# Patient Record
Sex: Female | Born: 1955
Health system: Southern US, Community
[De-identification: ages and names within clinical notes are randomized; demographics above are authoritative.]

## PROBLEM LIST (undated history)

## (undated) DIAGNOSIS — F419 Anxiety disorder, unspecified: Secondary | ICD-10-CM

## (undated) DIAGNOSIS — E039 Hypothyroidism, unspecified: Secondary | ICD-10-CM

## (undated) DIAGNOSIS — E785 Hyperlipidemia, unspecified: Secondary | ICD-10-CM

## (undated) DIAGNOSIS — C801 Malignant (primary) neoplasm, unspecified: Secondary | ICD-10-CM

## (undated) DIAGNOSIS — W19XXXA Unspecified fall, initial encounter: Secondary | ICD-10-CM

## (undated) DIAGNOSIS — E049 Nontoxic goiter, unspecified: Secondary | ICD-10-CM

## (undated) DIAGNOSIS — M810 Age-related osteoporosis without current pathological fracture: Secondary | ICD-10-CM

## (undated) DIAGNOSIS — Z1509 Genetic susceptibility to other malignant neoplasm: Secondary | ICD-10-CM

## (undated) DIAGNOSIS — Z923 Personal history of irradiation: Secondary | ICD-10-CM

## (undated) DIAGNOSIS — Z1501 Genetic susceptibility to malignant neoplasm of breast: Secondary | ICD-10-CM

## (undated) HISTORY — DX: Genetic susceptibility to malignant neoplasm of breast: Z15.01

## (undated) HISTORY — PX: BREAST LUMPECTOMY: SHX2

## (undated) HISTORY — DX: Hyperlipidemia, unspecified: E78.5

## (undated) HISTORY — DX: Genetic susceptibility to other malignant neoplasm: Z15.09

## (undated) HISTORY — DX: Age-related osteoporosis without current pathological fracture: M81.0

## (undated) HISTORY — DX: Anxiety disorder, unspecified: F41.9

## (undated) HISTORY — DX: Nontoxic goiter, unspecified: E04.9

---

## 1997-09-09 ENCOUNTER — Other Ambulatory Visit: Admission: RE | Admit: 1997-09-09 | Discharge: 1997-09-09 | Payer: Self-pay | Admitting: Obstetrics and Gynecology

## 1998-10-06 ENCOUNTER — Other Ambulatory Visit: Admission: RE | Admit: 1998-10-06 | Discharge: 1998-10-06 | Payer: Self-pay | Admitting: Obstetrics and Gynecology

## 1999-10-03 ENCOUNTER — Other Ambulatory Visit: Admission: RE | Admit: 1999-10-03 | Discharge: 1999-10-03 | Payer: Self-pay | Admitting: Obstetrics and Gynecology

## 2000-10-08 ENCOUNTER — Other Ambulatory Visit: Admission: RE | Admit: 2000-10-08 | Discharge: 2000-10-08 | Payer: Self-pay | Admitting: Obstetrics and Gynecology

## 2001-11-11 ENCOUNTER — Other Ambulatory Visit: Admission: RE | Admit: 2001-11-11 | Discharge: 2001-11-11 | Payer: Self-pay | Admitting: Obstetrics and Gynecology

## 2002-12-01 ENCOUNTER — Other Ambulatory Visit: Admission: RE | Admit: 2002-12-01 | Discharge: 2002-12-01 | Payer: Self-pay | Admitting: Obstetrics and Gynecology

## 2005-03-21 ENCOUNTER — Ambulatory Visit (HOSPITAL_COMMUNITY): Admission: RE | Admit: 2005-03-21 | Discharge: 2005-03-21 | Payer: Self-pay | Admitting: Gastroenterology

## 2006-04-02 DIAGNOSIS — C801 Malignant (primary) neoplasm, unspecified: Secondary | ICD-10-CM

## 2006-04-02 HISTORY — DX: Malignant (primary) neoplasm, unspecified: C80.1

## 2007-03-05 ENCOUNTER — Ambulatory Visit: Payer: Self-pay | Admitting: Oncology

## 2007-03-13 ENCOUNTER — Encounter (INDEPENDENT_AMBULATORY_CARE_PROVIDER_SITE_OTHER): Payer: Self-pay | Admitting: Surgery

## 2007-03-13 ENCOUNTER — Ambulatory Visit (HOSPITAL_BASED_OUTPATIENT_CLINIC_OR_DEPARTMENT_OTHER): Admission: RE | Admit: 2007-03-13 | Discharge: 2007-03-13 | Payer: Self-pay | Admitting: Surgery

## 2007-04-03 HISTORY — PX: ABDOMINAL HYSTERECTOMY: SHX81

## 2007-04-16 LAB — CBC WITH DIFFERENTIAL/PLATELET
Eosinophils Absolute: 0.2 10*3/uL (ref 0.0–0.5)
MONO#: 0.5 10*3/uL (ref 0.1–0.9)
NEUT#: 4.2 10*3/uL (ref 1.5–6.5)
Platelets: 293 10*3/uL (ref 145–400)
RBC: 4.68 10*6/uL (ref 3.70–5.32)
RDW: 12.5 % (ref 11.3–14.5)
WBC: 7.2 10*3/uL (ref 3.9–10.0)
lymph#: 2.4 10*3/uL (ref 0.9–3.3)

## 2007-04-16 LAB — COMPREHENSIVE METABOLIC PANEL
ALT: 48 U/L — ABNORMAL HIGH (ref 0–35)
Albumin: 4.9 g/dL (ref 3.5–5.2)
CO2: 25 mEq/L (ref 19–32)
Chloride: 101 mEq/L (ref 96–112)
Glucose, Bld: 119 mg/dL — ABNORMAL HIGH (ref 70–99)
Potassium: 4.2 mEq/L (ref 3.5–5.3)
Sodium: 139 mEq/L (ref 135–145)
Total Bilirubin: 0.6 mg/dL (ref 0.3–1.2)
Total Protein: 8 g/dL (ref 6.0–8.3)

## 2007-04-16 LAB — LACTATE DEHYDROGENASE: LDH: 157 U/L (ref 94–250)

## 2007-04-16 LAB — CANCER ANTIGEN 27.29: CA 27.29: 21 U/mL (ref 0–39)

## 2007-04-21 ENCOUNTER — Ambulatory Visit: Payer: Self-pay | Admitting: Oncology

## 2007-05-01 ENCOUNTER — Ambulatory Visit (HOSPITAL_COMMUNITY): Admission: RE | Admit: 2007-05-01 | Discharge: 2007-05-01 | Payer: Self-pay | Admitting: Oncology

## 2007-05-12 ENCOUNTER — Ambulatory Visit: Admission: RE | Admit: 2007-05-12 | Discharge: 2007-07-22 | Payer: Self-pay | Admitting: Radiation Oncology

## 2007-07-02 ENCOUNTER — Ambulatory Visit: Payer: Self-pay | Admitting: Oncology

## 2007-07-04 LAB — CBC WITH DIFFERENTIAL/PLATELET
Basophils Absolute: 0 10*3/uL (ref 0.0–0.1)
Eosinophils Absolute: 0.1 10*3/uL (ref 0.0–0.5)
HCT: 38.6 % (ref 34.8–46.6)
HGB: 13.6 g/dL (ref 11.6–15.9)
MONO#: 0.4 10*3/uL (ref 0.1–0.9)
NEUT#: 3.5 10*3/uL (ref 1.5–6.5)
NEUT%: 65.2 % (ref 39.6–76.8)
RDW: 12.5 % (ref 11.3–14.5)
WBC: 5.4 10*3/uL (ref 3.9–10.0)
lymph#: 1.3 10*3/uL (ref 0.9–3.3)

## 2007-07-07 LAB — COMPREHENSIVE METABOLIC PANEL
Albumin: 4.7 g/dL (ref 3.5–5.2)
BUN: 15 mg/dL (ref 6–23)
CO2: 27 mEq/L (ref 19–32)
Calcium: 10.3 mg/dL (ref 8.4–10.5)
Chloride: 102 mEq/L (ref 96–112)
Creatinine, Ser: 1.02 mg/dL (ref 0.40–1.20)
Potassium: 3.9 mEq/L (ref 3.5–5.3)

## 2007-07-07 LAB — CANCER ANTIGEN 27.29: CA 27.29: 23 U/mL (ref 0–39)

## 2007-07-07 LAB — FOLLICLE STIMULATING HORMONE: FSH: 109.1 m[IU]/mL

## 2007-07-23 ENCOUNTER — Ambulatory Visit: Admission: RE | Admit: 2007-07-23 | Discharge: 2007-07-23 | Payer: Self-pay | Admitting: Gynecologic Oncology

## 2007-09-09 ENCOUNTER — Ambulatory Visit: Payer: Self-pay | Admitting: Oncology

## 2007-09-11 LAB — COMPREHENSIVE METABOLIC PANEL
ALT: 27 U/L (ref 0–35)
BUN: 18 mg/dL (ref 6–23)
CO2: 25 mEq/L (ref 19–32)
Creatinine, Ser: 0.85 mg/dL (ref 0.40–1.20)
Glucose, Bld: 103 mg/dL — ABNORMAL HIGH (ref 70–99)
Total Bilirubin: 0.4 mg/dL (ref 0.3–1.2)

## 2007-09-11 LAB — CBC WITH DIFFERENTIAL/PLATELET
BASO%: 1.1 % (ref 0.0–2.0)
Basophils Absolute: 0.1 10*3/uL (ref 0.0–0.1)
HCT: 39.2 % (ref 34.8–46.6)
LYMPH%: 21.4 % (ref 14.0–48.0)
MCH: 29.3 pg (ref 26.0–34.0)
MCHC: 34.4 g/dL (ref 32.0–36.0)
MONO#: 0.5 10*3/uL (ref 0.1–0.9)
NEUT%: 68.7 % (ref 39.6–76.8)
Platelets: 232 10*3/uL (ref 145–400)
WBC: 7.2 10*3/uL (ref 3.9–10.0)

## 2007-09-11 LAB — CANCER ANTIGEN 27.29: CA 27.29: 24 U/mL (ref 0–39)

## 2007-10-07 ENCOUNTER — Ambulatory Visit (HOSPITAL_COMMUNITY): Admission: RE | Admit: 2007-10-07 | Discharge: 2007-10-07 | Payer: Self-pay | Admitting: *Deleted

## 2007-10-07 ENCOUNTER — Encounter (INDEPENDENT_AMBULATORY_CARE_PROVIDER_SITE_OTHER): Payer: Self-pay | Admitting: *Deleted

## 2007-12-23 ENCOUNTER — Encounter: Admission: RE | Admit: 2007-12-23 | Discharge: 2007-12-23 | Payer: Self-pay | Admitting: Oncology

## 2008-01-13 ENCOUNTER — Ambulatory Visit: Payer: Self-pay | Admitting: Oncology

## 2008-01-15 LAB — CBC WITH DIFFERENTIAL/PLATELET
BASO%: 0.6 % (ref 0.0–2.0)
Basophils Absolute: 0 10*3/uL (ref 0.0–0.1)
EOS%: 5.6 % (ref 0.0–7.0)
Eosinophils Absolute: 0.3 10*3/uL (ref 0.0–0.5)
HCT: 38.8 % (ref 34.8–46.6)
HGB: 13.4 g/dL (ref 11.6–15.9)
LYMPH%: 29.9 % (ref 14.0–48.0)
MCH: 30.5 pg (ref 26.0–34.0)
MCHC: 34.5 g/dL (ref 32.0–36.0)
MCV: 88.2 fL (ref 81.0–101.0)
MONO#: 0.3 10*3/uL (ref 0.1–0.9)
MONO%: 7 % (ref 0.0–13.0)
NEUT#: 2.7 10*3/uL (ref 1.5–6.5)
NEUT%: 56.9 % (ref 39.6–76.8)
Platelets: 220 10*3/uL (ref 145–400)
RBC: 4.39 10*6/uL (ref 3.70–5.32)
RDW: 13.1 % (ref 11.3–14.5)
WBC: 4.8 10*3/uL (ref 3.9–10.0)
lymph#: 1.4 10*3/uL (ref 0.9–3.3)

## 2008-01-16 LAB — VITAMIN D 25 HYDROXY (VIT D DEFICIENCY, FRACTURES): Vit D, 25-Hydroxy: 24 ng/mL — ABNORMAL LOW (ref 30–89)

## 2008-01-16 LAB — COMPREHENSIVE METABOLIC PANEL
AST: 21 U/L (ref 0–37)
BUN: 14 mg/dL (ref 6–23)
Potassium: 4 mEq/L (ref 3.5–5.3)
Sodium: 143 mEq/L (ref 135–145)

## 2008-01-16 LAB — CANCER ANTIGEN 27.29: CA 27.29: 22 U/mL (ref 0–39)

## 2008-07-13 ENCOUNTER — Ambulatory Visit: Payer: Self-pay | Admitting: Oncology

## 2008-07-15 LAB — CBC WITH DIFFERENTIAL/PLATELET
EOS%: 4.1 % (ref 0.0–7.0)
HGB: 13.4 g/dL (ref 11.6–15.9)
MCH: 29.4 pg (ref 25.1–34.0)
MCHC: 34.4 g/dL (ref 31.5–36.0)
MCV: 85.3 fL (ref 79.5–101.0)
MONO#: 0.3 10*3/uL (ref 0.1–0.9)
MONO%: 5.9 % (ref 0.0–14.0)
NEUT%: 57.8 % (ref 38.4–76.8)
Platelets: 199 10*3/uL (ref 145–400)
lymph#: 1.7 10*3/uL (ref 0.9–3.3)

## 2008-07-16 LAB — CANCER ANTIGEN 27.29: CA 27.29: 29 U/mL (ref 0–39)

## 2008-07-16 LAB — COMPREHENSIVE METABOLIC PANEL
ALT: 17 U/L (ref 0–35)
Albumin: 4.6 g/dL (ref 3.5–5.2)
Calcium: 10.2 mg/dL (ref 8.4–10.5)
Chloride: 102 mEq/L (ref 96–112)
Creatinine, Ser: 1.07 mg/dL (ref 0.40–1.20)
Potassium: 4.1 mEq/L (ref 3.5–5.3)
Sodium: 139 mEq/L (ref 135–145)

## 2009-01-07 ENCOUNTER — Ambulatory Visit: Payer: Self-pay | Admitting: Oncology

## 2009-01-10 LAB — CBC WITH DIFFERENTIAL/PLATELET
BASO%: 0.3 % (ref 0.0–2.0)
EOS%: 6.3 % (ref 0.0–7.0)
Eosinophils Absolute: 0.3 10*3/uL (ref 0.0–0.5)
HCT: 38.5 % (ref 34.8–46.6)
HGB: 13.1 g/dL (ref 11.6–15.9)
LYMPH%: 29.2 % (ref 14.0–49.7)
MCHC: 34.1 g/dL (ref 31.5–36.0)
MONO#: 0.4 10*3/uL (ref 0.1–0.9)
MONO%: 7.7 % (ref 0.0–14.0)
NEUT#: 3.1 10*3/uL (ref 1.5–6.5)
NEUT%: 56.5 % (ref 38.4–76.8)
RDW: 12.7 % (ref 11.2–14.5)

## 2009-01-10 LAB — COMPREHENSIVE METABOLIC PANEL
ALT: 33 U/L (ref 0–35)
BUN: 13 mg/dL (ref 6–23)
Chloride: 104 mEq/L (ref 96–112)
Glucose, Bld: 102 mg/dL — ABNORMAL HIGH (ref 70–99)
Sodium: 139 mEq/L (ref 135–145)
Total Protein: 7.5 g/dL (ref 6.0–8.3)

## 2009-01-11 LAB — CANCER ANTIGEN 27.29: CA 27.29: 26 U/mL (ref 0–39)

## 2009-01-11 LAB — VITAMIN D 25 HYDROXY (VIT D DEFICIENCY, FRACTURES): Vit D, 25-Hydroxy: 35 ng/mL (ref 30–89)

## 2009-03-03 ENCOUNTER — Ambulatory Visit (HOSPITAL_COMMUNITY): Admission: RE | Admit: 2009-03-03 | Discharge: 2009-03-03 | Payer: Self-pay | Admitting: Oncology

## 2009-12-22 ENCOUNTER — Encounter: Admission: RE | Admit: 2009-12-22 | Discharge: 2009-12-22 | Payer: Self-pay | Admitting: Oncology

## 2010-01-11 ENCOUNTER — Ambulatory Visit: Payer: Self-pay | Admitting: Oncology

## 2010-01-13 LAB — CBC WITH DIFFERENTIAL/PLATELET
Basophils Absolute: 0 10*3/uL (ref 0.0–0.1)
HCT: 36.6 % (ref 34.8–46.6)
HGB: 12.8 g/dL (ref 11.6–15.9)
MCH: 30.1 pg (ref 25.1–34.0)
MCHC: 35 g/dL (ref 31.5–36.0)
MONO#: 0.4 10*3/uL (ref 0.1–0.9)
NEUT#: 3.1 10*3/uL (ref 1.5–6.5)
NEUT%: 58 % (ref 38.4–76.8)
RBC: 4.24 10*6/uL (ref 3.70–5.45)
RDW: 12.6 % (ref 11.2–14.5)

## 2010-01-14 LAB — COMPREHENSIVE METABOLIC PANEL
ALT: 20 U/L (ref 0–35)
AST: 20 U/L (ref 0–37)
Albumin: 4.3 g/dL (ref 3.5–5.2)
Alkaline Phosphatase: 71 U/L (ref 39–117)
BUN: 14 mg/dL (ref 6–23)
CO2: 22 mEq/L (ref 19–32)
Calcium: 9.9 mg/dL (ref 8.4–10.5)
Chloride: 104 mEq/L (ref 96–112)
Creatinine, Ser: 0.9 mg/dL (ref 0.40–1.20)
Glucose, Bld: 87 mg/dL (ref 70–99)
Potassium: 4.2 mEq/L (ref 3.5–5.3)
Sodium: 141 mEq/L (ref 135–145)
Total Bilirubin: 0.3 mg/dL (ref 0.3–1.2)
Total Protein: 6.7 g/dL (ref 6.0–8.3)

## 2010-01-14 LAB — VITAMIN D 25 HYDROXY (VIT D DEFICIENCY, FRACTURES): Vit D, 25-Hydroxy: 73 ng/mL (ref 30–89)

## 2010-04-26 ENCOUNTER — Other Ambulatory Visit: Payer: Self-pay | Admitting: Obstetrics and Gynecology

## 2010-08-15 NOTE — Consult Note (Signed)
[name], [name]               ACCOUNT NO.:  [id number]   MEDICAL RECORD NO.:  [id number]          PATIENT TYPE:  OUT   LOCATION:  GYN                          FACILITY:  [facility]   PHYSICIAN:  Paola A. Duard Brady, MD    DATE OF BIRTH:  [birth year]   DATE OF CONSULTATION:  07/23/2007  DATE OF DISCHARGE:                                 CONSULTATION   REFERRING PHYSICIAN:  Floyde Parkins, MD   REASON FOR CONSULTATION:  The patient is seen today in consultation at  the request of Dr. Rosalio Macadamia.  [name] is a very pleasant 55 year old  gravida 2, para 2, who has been menopausal since the age of 33.  She was  diagnosed with breast cancer at the age of 69 and was underwent genetic  testing and is positive for BRCA gene mutation.  She had been on an  NSABP breast cancer trial about 15 years ago and was on tamoxifen for 5  years for breast cancer prevention.  During that time, she underwent  several endometrial biopsies by Dr. Rosalio Macadamia which were always  negative.  A few years ago, she asked Dr. Rosalio Macadamia to perform another  repeat endometrial biopsy and it was also negative.  She comes in today  for discussion of prophylactic bilateral salpingo-oophorectomy.  She is  currently scheduled for surgery with Dr. Rosalio Macadamia in July 2009.  The  issue is that her mother also had a concomitant clear cell carcinoma of  the endometrium diagnosed at the age of 56 and the question arises  whether or not she should undergo a hysterectomy at the time of her  bilateral salpingo-oophorectomy.  The patient at this point states that  nobody has discussed mastectomy with her for her breast cancer to  decrease risk; however, she does not feel that she would undergo that  procedure at this time.  She otherwise denies any significant  complaints.   PAST OBSTETRICAL/GYNECOLOGICAL HISTORY:  She had NSVD x2.   PAST MEDICAL HISTORY:  1. Hypothyroidism.  2. Breast cancer.  3. Anxiety.   ALLERGIES:  AMPICILLIN  and MACRODANTIN, which both cause hives.   MEDICATIONS:  1. Arimidex.  2. Synthroid 112 mcg daily.  3. Tranxene 7.5 mg b.i.d.  4. Ambien CR 12.5 mg nightly.  5. Folic acid 1 mg daily.  6. Multivitamin.  7. Calcium with vitamin D.  8. Fosamax 70 mg weekly.   SOCIAL HISTORY:  She denies history of tobacco or alcohol.  She stays at  home.  She has 2 sons, ages 45 and 84.  Her husband is a physician in  the area.   FAMILY HISTORY:  Her mother had breast cancer at the age of 67 and then  developed a clear cell carcinoma of the endometrium at the age of 14 and  succumbed to that disease.  Her maternal grandmother had breast cancer  in her 49s and died of ovarian cancer in her 63s.  She has a maternal  first cousin with pancreatic cancer.  Her father had colon cancer at the  age of 31 and she has a paternal  uncle who had colon cancer in his 50s.   PHYSICAL EXAMINATION:  VITAL SIGNS:  Weight 137 pounds, height 5 feet 4  inches.  Blood pressure 122/84, pulse 84.  GENERAL:  A well-nourished, well-developed female in no acute distress.  ABDOMEN:  Soft and nontender.   ASSESSMENT AND RECOMMENDATION:  Thirty minutes of face-to-face time were  spent with the patient discussing these issues.  From a BRCA mutation  perspective, that she is BRCA-positive, I would recommend that she  undergo bilateral salpingo-oophorectomy.  We reviewed the data regarding  type II endometrial cancer in breast cancers.  It does appear that woman  who have a prior history of breast cancer have a 2.5 relative increased  risk of developing a clear cell or papillary serous carcinoma as  compared to other endometrial carcinomas.  With that being said, the  risk of endometrial cancer in breast cancer patients is quite small.  I  do not think her prior history of tamoxifen plays significantly into her  subsequently developing an endometrial cancer at this point, as her  tamoxifen history was over 5 years ago.  She asked  me what I would do  and I am always hesitant to state this, but considering her family  history of endometrial cancer as well as colon cancer, could there be  the possibility of another mutation is always a possibility; however,  from our perspective her BRCA mutation, she does need to undergo a  bilateral salpingo-oophorectomy.  She does have an increased risk of  subsequently developing a breast cancer, not only because of her prior  history of breast cancer, but also her BRCA mutation, but she states she  is not prepared to really consider the issue of a mastectomy at this  point.  She will follow up with Dr. Rosalio Macadamia.  She knows that I will be  happy to see her in the future to discuss hysterectomy, but at this  point she appears to be leaning towards bilateral salpingo-oophorectomy  alone.  She has my card and will call me if she has a questions.      Paola A. Duard Brady, MD  Electronically Signed     PAG/MEDQ  D:  07/23/2007  T:  07/23/2007  Job:  [id number]   cc:   Sherry A. Rosalio Macadamia, M.D.  Fax: [phone]   Valentino Hue. Magrinat, M.D.  Fax: [phone]   Dr. Sherald Barge, R.N.  [address]  [place], [place] [address]

## 2010-08-15 NOTE — Op Note (Signed)
[name], [name]               ACCOUNT NO.:  [id number]   MEDICAL RECORD NO.:  [id number]          PATIENT TYPE:  OIB   LOCATION:  9317                          FACILITY:  [facility]   PHYSICIAN:  [place] B. Earlene Mullins, M.D.  DATE OF BIRTH:  [birth year]   DATE OF PROCEDURE:  10/07/2007  DATE OF DISCHARGE:  10/07/2007                               OPERATIVE REPORT   PREOPERATIVE DIAGNOSES:  1. Breast cancer  2. BRCAII Mutation carrier.   POSTOPERATIVE DIAGNOSES:  1. Breast cancer.  2. BRCAII Mutation carrier.   PROCEDURES:  1. Total laparoscopic hysterectomy.  2. Bilateral salpingo-oophorectomy.   SURGEON:  Chester Holstein. Earlene Plater, MD   ASSISTANT:  Floyde Parkins, MD.   ANESTHESIA:  General.   FINDINGS:  Normal-appearing uterus, tubes, ovaries, appendix, and upper  abdomen.   SPECIMENS:  Uterus, cervix, bilateral tubes, and ovaries to pathology.   BLOOD LOSS:  Less than 25 mL.   COMPLICATIONS:  None.   INDICATIONS:  The patient with above history for definitive surgical  management for reduction of subsequent malignancy risks.  The patient  was advised the risks of surgery including infection, bleeding, and  damage to surrounding organs.   PROCEDURE:  The patient was taken to the operating room.  General  anesthesia was obtained.  Prepped and draped in standard fashion.  Foley  catheter was inserted into the bladder.  Uterus sounded to 6 cm.  A #6  RUMI tip was assembled, inserted, and secured.   A 10-mm incision was placed in the umbilicus.  Fascia was divided  sharply and elevated.  Posterior sheath and peritoneum were elevated and  entered sharply.  Purse-string suture of 0 Vicryl was placed on the  fascial defect.  Hasson cannula was inserted and secured.  Pneumoperitoneum was obtained with CO2 gas.  A 5-mm ancillary trocars  was placed in the left and right periumbilical and left lower quadrant,  all under direct laparoscopic visualization.   Trendelenburg position was  obtained.  Bowel was mobilized superiorly and  pelvis was inspected.  Course of each ureter was identified and found to  be well away.   Left round ligament was placed on traction, sealed, and divided with  harmonic Ace.  Left IP ligament was sealed and divided with Gyrus  bipolar.  Dissection was carried along with the harmonic to the broad  ligament.  It was skeletonized anteriorly and posteriorly, exposing the  uterine artery.  Bladder flap was created sharply and advanced sharply.  RUMI was elevated and left uterine artery was sealed and divided with  the harmonic at the level of the KOH ring.  Entire procedure was  repeated on the right side in the exact same fashion.   Anterior colpotomy was made with the harmonic and carried around the  angles posteriorly in the same manner.  Angles were taken down with a  combination of bipolar and harmonic.   Uterus was delivered into the vagina along with tubes and ovaries.  Pneumo Occluder was replaced.   Vaginal cuff was closed with PDO quill suture of the left angle and a  running stitch from the right to close the remainder of the cuff.   Pneumoperitoneum was taken to minimal settings.  Cuff was inspected and  pedicles at the IP ligaments all were hemostatic.   The ancillary ports were removed.  Their sites were hemostatic.   Scope was removed.  Gas was released.  Hasson cannula was removed.  Umbilical incision was elevated with [employer] retractors.  Purse-string  suture was snugged down.  This obliterated the fascial defect.  No intra-  abdominal contents were herniated through prior closure.  Skin was  closed with 4-0 Vicryl and Dermabond.   The patient tolerated the procedure well without complications.  She was  taken to the recovery room awake, alert, and in stable condition.  All  counts were correct per the operating staff.      [name], M.D.  Electronically Signed     WBD/MEDQ  D:  10/07/2007  T:   10/08/2007  Job:  [id number]

## 2010-08-15 NOTE — Op Note (Signed)
[name], ASHMORE               ACCOUNT NO.:  [id number]   MEDICAL RECORD NO.:  [id number]          PATIENT TYPE:  AMB   LOCATION:  DSC                          FACILITY:  [facility]   PHYSICIAN:  Molli Hazard B. Daphine Deutscher, MD  DATE OF BIRTH:  [birth year]   DATE OF PROCEDURE:  03/13/2007  DATE OF DISCHARGE:                               OPERATIVE REPORT   PREOPERATIVE DIAGNOSIS:  Left breast invasive carcinoma.   POSTOPERATIVE DIAGNOSIS:  Left breast invasive carcinoma.   PROCEDURE:  Left axillary sentinel lymph node mapping and lymph node  biopsy, left breast lumpectomy.   SURGEON:  Thornton Park. Daphine Deutscher, MD   ANESTHESIA:  General by LMA.   FINDINGS:  The lymph node touch prep negative, clinical staging T1, N0,  MX.   DESCRIPTION OF PROCEDURE:  Harolyn Cocker was taken to room 6 on 12/11  given general anesthesia.  She got Ancef preop.  Breast was prepped  first marked with the axilla was mapped by me and then marked and then  was prepped with Betadine and draped sterilely.  I made a first  transverse incision in the axilla, carried this down and blunt and right-  angle dissection into the axillary area found a hot zone with blue dye  which I previous injected about 2 mL of blue dye beneath the left  nipple.  I went ahead and were retrieved some fatty tissue that was hot  but minimally so and then found a node that was blue and had counts over  400.  I removed that using little clips to control the lymphovascular  inflow into that area.  After removing that, the background counts went  essentially 0.  That was sent for touch preps which Dr. Star Age said  were negative.   The meantime I went up and made a radial incision at the 3 o'clock  position and created flaps superiorly and inferiorly using the Bovie and  went medially beneath the nipple then essentially excised the lobule at  that site down near the chest wall.  I felt like I got out the area  where there was some skin dimpling  and where the area in question was  with a good margin.  I did encounter some fibrocystic change and cyst in  s area but everything was firm I got well around.  I oriented with the  suture anteriorly toward the nipple with a little clip on it.  It was a  long clip and also posteriorly another.  I also called and oriented this  in the 3 o'clock position.  The wound in the meantime was irrigated.  I  waited until a specimen mammogram was done over at Dr. Cherlyn Labella which  showed the clip was well in  the specimen.  I infiltrated the area with Marcaine, closed it with 4-0  Vicryl, approximated some of the breast tissue within and then  subcutaneously and then used Benzoin Steri-Strips on the skin.  The  patient seemed to tolerate procedure well and was given Percocet to take  for pain and will be followed up in the  office in a few days.      Thornton Park Daphine Deutscher, MD  Electronically Signed     MBM/MEDQ  D:  03/13/2007  T:  03/14/2007  Job:  [id number]   cc:   Dr. Jeralyn Ruths  Eliberto Ivory. Rosalio Macadamia, M.D.

## 2010-09-04 ENCOUNTER — Encounter (INDEPENDENT_AMBULATORY_CARE_PROVIDER_SITE_OTHER): Payer: Self-pay | Admitting: Surgery

## 2010-12-28 LAB — URINE MICROSCOPIC-ADD ON

## 2010-12-28 LAB — CBC
HCT: 39.5
MCHC: 33.5
MCV: 88.6
Platelets: 243
WBC: 5.8

## 2010-12-28 LAB — URINALYSIS, ROUTINE W REFLEX MICROSCOPIC
Glucose, UA: NEGATIVE
Protein, ur: NEGATIVE
Specific Gravity, Urine: 1.01

## 2010-12-28 LAB — DIFFERENTIAL
Basophils Relative: 0
Eosinophils Absolute: 0.2
Eosinophils Relative: 3
Lymphs Abs: 1.4
Monocytes Relative: 7

## 2011-01-08 LAB — POCT HEMOGLOBIN-HEMACUE: Operator id: 128471

## 2011-01-22 ENCOUNTER — Other Ambulatory Visit: Payer: Self-pay | Admitting: Oncology

## 2011-01-22 ENCOUNTER — Encounter (HOSPITAL_BASED_OUTPATIENT_CLINIC_OR_DEPARTMENT_OTHER): Payer: BC Managed Care – PPO | Admitting: Oncology

## 2011-01-22 DIAGNOSIS — C50419 Malignant neoplasm of upper-outer quadrant of unspecified female breast: Secondary | ICD-10-CM

## 2011-01-22 DIAGNOSIS — Z170 Estrogen receptor positive status [ER+]: Secondary | ICD-10-CM

## 2011-01-22 LAB — CBC WITH DIFFERENTIAL/PLATELET
BASO%: 0.5 % (ref 0.0–2.0)
Basophils Absolute: 0 10e3/uL (ref 0.0–0.1)
EOS%: 3.8 % (ref 0.0–7.0)
Eosinophils Absolute: 0.2 10e3/uL (ref 0.0–0.5)
HCT: 38.2 % (ref 34.8–46.6)
HGB: 13.1 g/dL (ref 11.6–15.9)
LYMPH%: 27.4 % (ref 14.0–49.7)
MCH: 30.1 pg (ref 25.1–34.0)
MCHC: 34.2 g/dL (ref 31.5–36.0)
MCV: 88.1 fL (ref 79.5–101.0)
MONO#: 0.4 10e3/uL (ref 0.1–0.9)
MONO%: 6.3 % (ref 0.0–14.0)
NEUT#: 3.7 10e3/uL (ref 1.5–6.5)
NEUT%: 62 % (ref 38.4–76.8)
Platelets: 229 10e3/uL (ref 145–400)
RBC: 4.34 10e6/uL (ref 3.70–5.45)
RDW: 13 % (ref 11.2–14.5)
WBC: 6 10e3/uL (ref 3.9–10.3)
lymph#: 1.7 10e3/uL (ref 0.9–3.3)

## 2011-01-23 LAB — COMPREHENSIVE METABOLIC PANEL
AST: 24 U/L (ref 0–37)
Albumin: 4.8 g/dL (ref 3.5–5.2)
Alkaline Phosphatase: 60 U/L (ref 39–117)
BUN: 15 mg/dL (ref 6–23)
Potassium: 4.2 mEq/L (ref 3.5–5.3)
Sodium: 140 mEq/L (ref 135–145)
Total Protein: 7.5 g/dL (ref 6.0–8.3)

## 2011-01-25 ENCOUNTER — Encounter (HOSPITAL_BASED_OUTPATIENT_CLINIC_OR_DEPARTMENT_OTHER): Payer: BC Managed Care – PPO | Admitting: Oncology

## 2011-01-25 DIAGNOSIS — Z79811 Long term (current) use of aromatase inhibitors: Secondary | ICD-10-CM

## 2011-01-25 DIAGNOSIS — C50919 Malignant neoplasm of unspecified site of unspecified female breast: Secondary | ICD-10-CM

## 2011-01-25 DIAGNOSIS — Z170 Estrogen receptor positive status [ER+]: Secondary | ICD-10-CM

## 2011-12-01 ENCOUNTER — Telehealth: Payer: Self-pay | Admitting: Oncology

## 2011-12-01 NOTE — Telephone Encounter (Signed)
Add to previous note. Lm for nurse per pt asking if this yr is the yr she should have an mri.

## 2011-12-01 NOTE — Telephone Encounter (Signed)
S/w pt re appt for 10/2 and 10/9.

## 2011-12-10 ENCOUNTER — Other Ambulatory Visit: Payer: Self-pay | Admitting: *Deleted

## 2011-12-10 DIAGNOSIS — Z853 Personal history of malignant neoplasm of breast: Secondary | ICD-10-CM | POA: Insufficient documentation

## 2011-12-12 ENCOUNTER — Telehealth: Payer: Self-pay | Admitting: Oncology

## 2011-12-12 NOTE — Telephone Encounter (Signed)
lmonvm of [name] of the breast center regarding this pt needing a breast mri sometime in late sept in correlation with her mammo appt

## 2011-12-28 ENCOUNTER — Other Ambulatory Visit: Payer: Self-pay | Admitting: *Deleted

## 2011-12-31 ENCOUNTER — Other Ambulatory Visit: Payer: BC Managed Care – PPO | Admitting: Lab

## 2011-12-31 ENCOUNTER — Ambulatory Visit
Admission: RE | Admit: 2011-12-31 | Discharge: 2011-12-31 | Disposition: A | Discharge status: Home / Self Care | Payer: BC Managed Care – PPO | Source: Ambulatory Visit | Attending: Oncology | Admitting: Oncology

## 2011-12-31 ENCOUNTER — Other Ambulatory Visit: Payer: Self-pay | Admitting: *Deleted

## 2011-12-31 DIAGNOSIS — Z853 Personal history of malignant neoplasm of breast: Secondary | ICD-10-CM

## 2011-12-31 MED ORDER — GADOBENATE DIMEGLUMINE 529 MG/ML IV SOLN
13.0000 mL | Freq: Once | INTRAVENOUS | Status: AC | PRN
  Administered 2011-12-31: 13 mL via INTRAVENOUS

## 2012-01-02 ENCOUNTER — Other Ambulatory Visit: Payer: BC Managed Care – PPO | Admitting: Lab

## 2012-01-09 ENCOUNTER — Encounter: Payer: Self-pay | Admitting: Oncology

## 2012-01-09 ENCOUNTER — Ambulatory Visit (HOSPITAL_BASED_OUTPATIENT_CLINIC_OR_DEPARTMENT_OTHER): Payer: BC Managed Care – PPO | Admitting: Oncology

## 2012-01-09 ENCOUNTER — Other Ambulatory Visit (HOSPITAL_BASED_OUTPATIENT_CLINIC_OR_DEPARTMENT_OTHER): Payer: BC Managed Care – PPO | Admitting: Lab

## 2012-01-09 ENCOUNTER — Telehealth: Payer: Self-pay | Admitting: Oncology

## 2012-01-09 VITALS — BP 137/68 | HR 66 | Temp 98.2°F | Resp 20 | Wt 145.6 lb

## 2012-01-09 DIAGNOSIS — Z170 Estrogen receptor positive status [ER+]: Secondary | ICD-10-CM

## 2012-01-09 DIAGNOSIS — Z853 Personal history of malignant neoplasm of breast: Secondary | ICD-10-CM

## 2012-01-09 DIAGNOSIS — C50919 Malignant neoplasm of unspecified site of unspecified female breast: Secondary | ICD-10-CM

## 2012-01-09 LAB — CBC WITH DIFFERENTIAL/PLATELET
BASO%: 0.6 % (ref 0.0–2.0)
Basophils Absolute: 0 10*3/uL (ref 0.0–0.1)
HCT: 40.4 % (ref 34.8–46.6)
HGB: 13.5 g/dL (ref 11.6–15.9)
LYMPH%: 29.5 % (ref 14.0–49.7)
MCHC: 33.4 g/dL (ref 31.5–36.0)
MONO#: 0.5 10*3/uL (ref 0.1–0.9)
NEUT%: 59.5 % (ref 38.4–76.8)
Platelets: 239 10*3/uL (ref 145–400)
WBC: 6.7 10*3/uL (ref 3.9–10.3)

## 2012-01-09 LAB — COMPREHENSIVE METABOLIC PANEL (CC13)
ALT: 35 U/L (ref 0–55)
BUN: 17 mg/dL (ref 7.0–26.0)
CO2: 25 mEq/L (ref 22–29)
Calcium: 10.3 mg/dL (ref 8.4–10.4)
Creatinine: 0.9 mg/dL (ref 0.6–1.1)
Glucose: 106 mg/dl — ABNORMAL HIGH (ref 70–99)
Total Bilirubin: 0.5 mg/dL (ref 0.20–1.20)

## 2012-01-09 MED ORDER — ANASTROZOLE 1 MG PO TABS: 1.0000 mg | ORAL_TABLET | Freq: Every day | ORAL | Status: DC

## 2012-01-09 NOTE — Telephone Encounter (Signed)
gve the pt her April 2014 appt calendar °

## 2012-01-09 NOTE — Progress Notes (Signed)
ID: [name]   DOB: 05-Nov-1955  MR#: [id number]  WUJ#:[id number]  PCP: Enrique Sack, MD GYN: Hilary Hertz SU: Luretha Murphy MD OTHER MD: Archer Asa, Danise Edge, Hoope Gruve   HISTORY OF PRESENT ILLNESS: [name] had an unremarkable mammogram at [facility] on December 16, 2006.  Her breasts are dense, and of course that decreases the sensitivity.  In any case the screening mammogram was read as unremarkable.  In December, however, [name] had her annual examination under Floyde Parkins, and Dr. Rosalio Macadamia palpated a lump in the left breast at the 3 o'clock position.  The patient was referred back for ultrasound, and this showed a hypoechoic mass measuring 1.4 cm corresponding to the palpable abnormality.  Going back to the September mammogram, Dr. Yolanda Bonine was able to tease out what possibly could have been a mammographic finding, but really it was so equivocal, that even in retrospect, the mammogram was not informative.   This was followed up with breast specific gamma imaging on March 11, 2007, and this found a solitary lesion, measuring up to 1.7 cm.  In addition, there was a 1.0 cm low intensity uptake area in the axilla, considered an indeterminate finding.    With this information, the patient proceeded to biopsy under ultrasound guidance of the mass.  This was performed on December 9, and it showed an invasive ductal carcinoma, which was ER positive at 100%, PR positive at 56%, with a borderline/low proliferation marker at 17%, and Hercept test negative at 1+.  [name] was then referred to Dr. Daphine Deutscher for further evaluation, and direction for definitive surgery, and she underwent left lumpectomy and sentinel lymph node biopsy March 13, 2007 for what proved to be ([id number]) a 2.1 cm invasive ductal carcinoma with some micro-papillary features, grade 2, with evidence of lymphovascular invasion, but zero of two sentinel lymph nodes involved.  Margins were adequate for both the  invasive and in situ components. Her subsequent history is as detailed below.  INTERVAL HISTORY: [name] returns today for followup of her breast cancer the interval history is unremarkable. She enjoys visiting the grandchildren, she and Ed recently went hiking in [place], and she continues to be extremely active at tennis, the gym, and running/walking.   REVIEW OF SYSTEMS: She is tolerating the Arimidex well, except for problems with vaginal dryness. She has occasional hot flashes which are not a major issue. Her worst problem is anxiety, particularly before coming here before having all the tests. Otherwise a detailed review of systems today was entirely noncontributory.    PAST MEDICAL HISTORY: Past Medical History  Diagnosis Date  . Goiter   . Osteoporosis   . Dyslipidemia   . Anxiety disorder     PAST SURGICAL HISTORY: Past Surgical History  Procedure Date  . Breast lumpectomy 2008  . Abdominal hysterectomy 2009    with BSO    FAMILY HISTORY (updated OCT 2013) Family History  Problem Relation Age of Onset  . Cancer Mother   The patient's father is currently 98, with a remote history of colon cancer.  The patient's mother died at the age of 25.  She had a history of breast cancer and the patient's mother's mother died from cancer, but the actual original site of cancer is unclear.  The patient has several stepbrothers and sisters, but only one half-sister, currently 42,with no history of cancer.   GYNECOLOGIC HISTORY: She is GX, P2.  First pregnancy age 56.  Last menstrual period age 20.  She did  not take hormone replacement.    SOCIAL HISTORY: (updated OCT 2013) Of course she is [name]. He has 4 children from an earlier marriage. She has two from hers, a 71 y/o son who works at [employer] in [place], and a 37 y/o son who lives in [place]. They have 3 grandchildren.   ADVANCED DIRECTIVES: in place  HEALTH MAINTENANCE: History  Substance Use Topics  . Smoking  status: Unknown If Ever Smoked  . Smokeless tobacco: Not on file  . Alcohol Use: Yes     GLASS OF WINE MOST DAYS     Colonoscopy: August 2012  PAP: Jan 2012  Bone density: 2013 AT [facility]/ stable  Lipid panel:  Allergies  Allergen Reactions  . Ampicillin     Current Outpatient Prescriptions  Medication Sig Dispense Refill  . anastrozole (ARIMIDEX) 1 MG tablet Take 1 tablet (1 mg total) by mouth daily.  90 tablet  3  . levothyroxine (SYNTHROID, LEVOTHROID) 125 MCG tablet Take 125 mcg by mouth daily.        . temazepam (RESTORIL) 7.5 MG capsule Take 7.5 mg by mouth at bedtime as needed.        . Zolpidem Tartrate (AMBIEN PO) Take by mouth.          OBJECTIVE: Middle-aged white woman who appears well Filed Vitals:   01/09/12 1558  BP: 137/68  Pulse: 66  Temp: 98.2 F (36.8 C)  Resp: 20     There is no height on file to calculate BMI.    ECOG FS: 0  Sclerae unicteric Oropharynx clear No cervical or supraclavicular adenopathy Lungs no rales or rhonchi Heart regular rate and rhythm Abd benign MSK no focal spinal tenderness, no peripheral edema Neuro: nonfocal Breasts: The right breast is unremarkable. The left breast is status post lumpectomy and radiation. There is no evidence of local recurrence. Left axilla is benign.  LAB RESULTS: Lab Results  Component Value Date   WBC 6.7 01/09/2012   NEUTROABS 4.0 01/09/2012   HGB 13.5 01/09/2012   HCT 40.4 01/09/2012   MCV 85.4 01/09/2012   PLT 239 01/09/2012      Chemistry      Component Value Date/Time   NA 139 01/09/2012 1522   NA 140 01/22/2011 1408   NA 140 01/22/2011 1408   NA 140 01/22/2011 1408   NA 140 01/22/2011 1408   K 4.0 01/09/2012 1522   K 4.2 01/22/2011 1408   K 4.2 01/22/2011 1408   K 4.2 01/22/2011 1408   K 4.2 01/22/2011 1408   CL 103 01/09/2012 1522   CL 104 01/22/2011 1408   CL 104 01/22/2011 1408   CL 104 01/22/2011 1408   CL 104 01/22/2011 1408   CO2 25 01/09/2012 1522   CO2 26 01/22/2011 1408    CO2 26 01/22/2011 1408   CO2 26 01/22/2011 1408   CO2 26 01/22/2011 1408   BUN 17.0 01/09/2012 1522   BUN 15 01/22/2011 1408   BUN 15 01/22/2011 1408   BUN 15 01/22/2011 1408   BUN 15 01/22/2011 1408   CREATININE 0.9 01/09/2012 1522   CREATININE 0.84 01/22/2011 1408   CREATININE 0.84 01/22/2011 1408   CREATININE 0.84 01/22/2011 1408   CREATININE 0.84 01/22/2011 1408      Component Value Date/Time   CALCIUM 10.3 01/09/2012 1522   CALCIUM 10.0 01/22/2011 1408   CALCIUM 10.0 01/22/2011 1408   CALCIUM 10.0 01/22/2011 1408   CALCIUM 10.0 01/22/2011 1408  ALKPHOS 85 01/09/2012 1522   ALKPHOS 60 01/22/2011 1408   ALKPHOS 60 01/22/2011 1408   ALKPHOS 60 01/22/2011 1408   ALKPHOS 60 01/22/2011 1408   AST 26 01/09/2012 1522   AST 24 01/22/2011 1408   AST 24 01/22/2011 1408   AST 24 01/22/2011 1408   AST 24 01/22/2011 1408   ALT 35 01/09/2012 1522   ALT 22 01/22/2011 1408   ALT 22 01/22/2011 1408   ALT 22 01/22/2011 1408   ALT 22 01/22/2011 1408   BILITOT 0.50 01/09/2012 1522   BILITOT 0.4 01/22/2011 1408   BILITOT 0.4 01/22/2011 1408   BILITOT 0.4 01/22/2011 1408   BILITOT 0.4 01/22/2011 1408       Lab Results  Component Value Date   LABCA2 17 01/22/2011   LABCA2 17 01/22/2011   LABCA2 17 01/22/2011    No components found with this basename: [id number]    No results found for this basename: INR:1;PROTIME:1 in the last 168 hours  Urinalysis    Component Value Date/Time   COLORURINE YELLOW 10/07/2007 0906   APPEARANCEUR CLEAR 10/07/2007 0906   LABSPEC 1.010 10/07/2007 0906   PHURINE 7.0 10/07/2007 0906   GLUCOSEU NEGATIVE 10/07/2007 0906   HGBUR TRACE* 10/07/2007 0906   BILIRUBINUR NEGATIVE 10/07/2007 0906   KETONESUR NEGATIVE 10/07/2007 0906   PROTEINUR NEGATIVE 10/07/2007 0906   UROBILINOGEN 0.2 10/07/2007 0906   NITRITE NEGATIVE 10/07/2007 0906   LEUKOCYTESUR MODERATE* 10/07/2007 0906    STUDIES: Mr Breast Bilateral W Wo Contrast  01/01/2012  *RADIOLOGY REPORT*  Clinical Data:  History of malignant lumpectomy of the left breast with radiation therapy in 2008.  BILATERAL BREAST MRI WITH AND WITHOUT CONTRAST  Technique: Multiplanar, multisequence MR images of both breasts were obtained prior to and following the intravenous administration of 13ml of Multihance.  Three dimensional images were evaluated at the independent DynaCad workstation.  Comparison:  12/22/2009 and 12/23/2007 breast MRIs. Also, comparison with prior mammograms from [facility] imaging the most recent which is dated 12/26/2011.  The  Findings: There is mild to moderate diffuse parenchymal background enhancement.  There are mild scarring changes located laterally within the left breast related to the patient's previous lumpectomy.  There are no worrisome enhancing foci within either breast.  There is no evidence for axillary or internal mammary adenopathy and there are no additional findings.  IMPRESSION: No findings worrisome for recurrent tumor or developing malignancy.  RECOMMENDATION: Diagnostic mammography in 1 year.  THREE-DIMENSIONAL MR IMAGE RENDERING ON INDEPENDENT WORKSTATION:  Three-dimensional MR images were rendered by post-processing of the original MR data on an independent workstation.  The three- dimensional MR images were interpreted, and findings were reported in the accompanying complete MRI report for this study.  BI-RADS CATEGORY 1:  Negative.   Original Report Authenticated By: Rolla Plate, M.D.     ASSESSMENT: 56 y.o. [place] woman status post left lumpectomy and sentinel lymph node biopsy December 2008 for a T2 N0, grade 2, invasive ductal carcinoma, strongly estrogen and progesterone receptor positive, HER2 negative with an MIB1 of 17%, in the setting of a deleterious BRCA2 mutation; with an Oncotype DX recurrence score of 20 predicting a 10-year risk of recurrence of 13% after 5 years on tamoxifen.  However, since she had taken tamoxifen remotely for breast cancer prevention, she started  Arimidex April 2009 and, of course, that is known to be more effective than tamoxifen.  Therefore, her final risk of recurrence should be less than 13%.  Note that she  underwent a TAH/BSO with benign pathology in July 2009  PLAN: [place] is doing terrific, with no evidence of disease recurrence. The plan is to continue anastrozole until April of 2014, and then reassess. She knows to call for any problems that may develop before the next visit.   MAGRINAT,GUSTAV C    01/09/2012

## 2012-01-10 LAB — CANCER ANTIGEN 27.29: CA 27.29: 20 U/mL (ref 0–39)

## 2012-06-30 ENCOUNTER — Other Ambulatory Visit: Payer: Self-pay | Admitting: Oncology

## 2012-07-02 ENCOUNTER — Telehealth (INDEPENDENT_AMBULATORY_CARE_PROVIDER_SITE_OTHER): Payer: Self-pay

## 2012-07-02 NOTE — Telephone Encounter (Signed)
Dr Chilton Si calling about his wife needing an appt with Dr Daphine Deutscher in the next couple of weeks for a growth near her rectum. The wife called to make an appt but they gave her an appt for the middle of May but Dr Chilton Si said his wife will go crazy waiting to be seen. I notified Dr Chilton Si that I would get Dr Ermalene Searing nurse Meagan to call him with an earlier appt date.

## 2012-07-03 ENCOUNTER — Telehealth (INDEPENDENT_AMBULATORY_CARE_PROVIDER_SITE_OTHER): Payer: Self-pay

## 2012-07-03 NOTE — Telephone Encounter (Signed)
LMOM letting pt know that I have scheduled her to come see MM on Thursday, April 17 @ 5p

## 2012-07-14 ENCOUNTER — Ambulatory Visit (HOSPITAL_BASED_OUTPATIENT_CLINIC_OR_DEPARTMENT_OTHER): Payer: BC Managed Care – PPO | Admitting: Physician Assistant

## 2012-07-14 ENCOUNTER — Encounter: Payer: Self-pay | Admitting: Physician Assistant

## 2012-07-14 ENCOUNTER — Telehealth: Payer: Self-pay | Admitting: *Deleted

## 2012-07-14 ENCOUNTER — Other Ambulatory Visit: Payer: BC Managed Care – PPO | Admitting: Lab

## 2012-07-14 VITALS — BP 120/69 | HR 62 | Temp 97.7°F | Resp 20 | Wt 148.3 lb

## 2012-07-14 DIAGNOSIS — C50919 Malignant neoplasm of unspecified site of unspecified female breast: Secondary | ICD-10-CM

## 2012-07-14 DIAGNOSIS — Z853 Personal history of malignant neoplasm of breast: Secondary | ICD-10-CM

## 2012-07-14 DIAGNOSIS — M810 Age-related osteoporosis without current pathological fracture: Secondary | ICD-10-CM

## 2012-07-14 DIAGNOSIS — Z170 Estrogen receptor positive status [ER+]: Secondary | ICD-10-CM

## 2012-07-14 DIAGNOSIS — Z1509 Genetic susceptibility to other malignant neoplasm: Secondary | ICD-10-CM

## 2012-07-14 DIAGNOSIS — Z1501 Genetic susceptibility to malignant neoplasm of breast: Secondary | ICD-10-CM

## 2012-07-14 HISTORY — DX: Genetic susceptibility to malignant neoplasm of breast: Z15.01

## 2012-07-14 MED ORDER — ANASTROZOLE 1 MG PO TABS: 1.0000 mg | ORAL_TABLET | Freq: Every day | ORAL | Status: DC

## 2012-07-14 NOTE — Telephone Encounter (Signed)
appts made and printed...td 

## 2012-07-14 NOTE — Progress Notes (Signed)
ID: Xzandria Clevinger Novinger   DOB: October 29, 1955  MR#: [id number]  WUJ#:[id number]  PCP: Enrique Sack, MD GYN: Hilary Hertz SU: Luretha Murphy MD OTHER MD: Archer Asa, Danise Edge, Hoope Gruve   HISTORY OF PRESENT ILLNESS: Moira had an unremarkable mammogram at [facility] on December 16, 2006.  Her breasts are dense, and of course that decreases the sensitivity.  In any case the screening mammogram was read as unremarkable.  In December, however, Misaki had her annual examination under [facility], and Dr. Rosalio Macadamia palpated a lump in the left breast at the 3 o'clock position.  The patient was referred back for ultrasound, and this showed a hypoechoic mass measuring 1.4 cm corresponding to the palpable abnormality.  Going back to the September mammogram, Dr. Yolanda Bonine was able to tease out what possibly could have been a mammographic finding, but really it was so equivocal, that even in retrospect, the mammogram was not informative.   This was followed up with breast specific gamma imaging on March 11, 2007, and this found a solitary lesion, measuring up to 1.7 cm.  In addition, there was a 1.0 cm low intensity uptake area in the axilla, considered an indeterminate finding.    With this information, the patient proceeded to biopsy under ultrasound guidance of the mass.  This was performed on December 9, and it showed an invasive ductal carcinoma, which was ER positive at 100%, PR positive at 56%, with a borderline/low proliferation marker at 17%, and Hercept test negative at 1+.  Ovie was then referred to Dr. Daphine Deutscher for further evaluation, and direction for definitive surgery, and she underwent left lumpectomy and sentinel lymph node biopsy March 13, 2007 for what proved to be ([id number]) a 2.1 cm invasive ductal carcinoma with some micro-papillary features, grade 2, with evidence of lymphovascular invasion, but zero of two sentinel lymph nodes involved.  Margins were adequate for both the  invasive and in situ components. Her subsequent history is as detailed below.  INTERVAL HISTORY: [place] returns today for followup of her left breast cancer.  She continues on anastrozole, which she has now been on for total of 5 years. She is tolerating the medication well. She has occasional hot flashes, but nothing that is problematic. She has some mild vaginal dryness which she says is "tolerable". She's had no increased joint pain.  Ryelynn stays very busy, and is very active. She exercises on a regular basis. Her energy level is good.  REVIEW OF SYSTEMS: Diavion has had no recent illnesses and denies any fevers or chills. She's had no rashes or skin changes and denies any abnormal bruising or bleeding. Her appetite is good. She denies any problems with nausea or emesis. She is having some mild "GI issues" with some slight rectal bleeding, all of which are being followed by Dr. Laural Benes. (She is status post colonoscopy approximately one year ago.) She's had no new cough, shortness of breath, chest pain, palpitations. No abnormal headaches or dizziness. She denies any unusual myalgias, arthralgias, or bony pain.   A detailed review of systems is otherwise stable and noncontributory.    PAST MEDICAL HISTORY: Past Medical History  Diagnosis Date  . Goiter   . Osteoporosis   . Dyslipidemia   . Anxiety disorder   . BRCA2 positive 07/14/2012    PAST SURGICAL HISTORY: Past Surgical History  Procedure Laterality Date  . Breast lumpectomy  2008  . Abdominal hysterectomy  2009    with BSO    FAMILY HISTORY (updated  OCT 2013) Family History  Problem Relation Age of Onset  . Cancer Mother   The patient's father is currently 56, with a remote history of colon cancer.  The patient's mother died at the age of 67.  She had a history of breast cancer and the patient's mother's mother died from cancer, but the actual original site of cancer is unclear.  The patient has several stepbrothers and  sisters, but only one half-sister, currently 42,with no history of cancer.   GYNECOLOGIC HISTORY: She is GX, P2.  First pregnancy age 70.  Last menstrual period age 89.  She did not take hormone replacement.    SOCIAL HISTORY: (updated OCT 2013) Of course she is [name]. He has 4 children from an earlier marriage. She has two from hers, a 10 y/o son who works at [employer] in [place], and a 52 y/o son who lives in [place]. They have 3 grandchildren.   ADVANCED DIRECTIVES: in place  HEALTH MAINTENANCE: History  Substance Use Topics  . Smoking status: Former Smoker    Types: Cigarettes  . Smokeless tobacco: Never Used  . Alcohol Use: Yes     Comment: GLASS OF WINE MOST DAYS     Colonoscopy: August 2012  PAP: April 2014  Bone density: 2013 AT [facility]/ stable osteoporosis  Lipid panel:  Allergies  Allergen Reactions  . Ampicillin     Current Outpatient Prescriptions  Medication Sig Dispense Refill  . alendronate (FOSAMAX) 70 MG tablet       . anastrozole (ARIMIDEX) 1 MG tablet Take 1 tablet (1 mg total) by mouth daily.  90 tablet  3  . clorazepate (TRANXENE) 7.5 MG tablet       . halobetasol (ULTRAVATE) 0.05 % ointment       . hydrocortisone (ANUSOL-HC) 25 MG suppository       . levothyroxine (SYNTHROID, LEVOTHROID) 125 MCG tablet Take 125 mcg by mouth daily.        . Probiotic Product (PROBIOTIC DAILY PO) Take 1 each by mouth daily.      . rosuvastatin (CRESTOR) 5 MG tablet Take 5 mg by mouth daily.      . temazepam (RESTORIL) 7.5 MG capsule Take 7.5 mg by mouth at bedtime as needed.        . Zolpidem Tartrate (AMBIEN PO) Take by mouth.         No current facility-administered medications for this visit.    OBJECTIVE: Middle-aged white woman who appears well Filed Vitals:   07/14/12 1506  BP: 120/69  Pulse: 62  Temp: 97.7 F (36.5 C)  Resp: 20     There is no height on file to calculate BMI.    ECOG FS: 0 Filed Weights   07/14/12 1506  Weight: 148 lb 4.8 oz  (67.268 kg)   Sclerae unicteric Oropharynx clear No cervical or supraclavicular adenopathy Lungs clear to auscultation bilaterally, no rales or rhonchi Heart regular rate and rhythm Abdomen is soft, nontender to palpation, with positive bowel sounds MSK no focal spinal tenderness to palpation, no peripheral edema Neuro: nonfocal, well oriented with positive affect Breasts: The right breast is unremarkable. The left breast is status post lumpectomy and radiation. There is no evidence of local recurrence. Axillae are benign bilaterally, no palpable adenopathy noted.  LAB RESULTS: Lab Results  Component Value Date   WBC 6.7 01/09/2012   NEUTROABS 4.0 01/09/2012   HGB 13.5 01/09/2012   HCT 40.4 01/09/2012   MCV 85.4 01/09/2012  PLT 239 01/09/2012      Chemistry      Component Value Date/Time   NA 139 01/09/2012 1522   NA 140 01/22/2011 1408   K 4.0 01/09/2012 1522   K 4.2 01/22/2011 1408   CL 103 01/09/2012 1522   CL 104 01/22/2011 1408   CO2 25 01/09/2012 1522   CO2 26 01/22/2011 1408   BUN 17.0 01/09/2012 1522   BUN 15 01/22/2011 1408   CREATININE 0.9 01/09/2012 1522   CREATININE 0.84 01/22/2011 1408      Component Value Date/Time   CALCIUM 10.3 01/09/2012 1522   CALCIUM 10.0 01/22/2011 1408   ALKPHOS 85 01/09/2012 1522   ALKPHOS 60 01/22/2011 1408   AST 26 01/09/2012 1522   AST 24 01/22/2011 1408   ALT 35 01/09/2012 1522   ALT 22 01/22/2011 1408   BILITOT 0.50 01/09/2012 1522   BILITOT 0.4 01/22/2011 1408       Lab Results  Component Value Date   LABCA2 20 01/09/2012      STUDIES: Mr Breast Bilateral W Wo Contrast  01/01/2012  *RADIOLOGY REPORT*  Clinical Data: History of malignant lumpectomy of the left breast with radiation therapy in 2008.  BILATERAL BREAST MRI WITH AND WITHOUT CONTRAST  Technique: Multiplanar, multisequence MR images of both breasts were obtained prior to and following the intravenous administration of 13ml of Multihance.  Three dimensional images  were evaluated at the independent DynaCad workstation.  Comparison:  12/22/2009 and 12/23/2007 breast MRIs. Also, comparison with prior mammograms from [facility] the most recent which is dated 12/26/2011.  The  Findings: There is mild to moderate diffuse parenchymal background enhancement.  There are mild scarring changes located laterally within the left breast related to the patient's previous lumpectomy.  There are no worrisome enhancing foci within either breast.  There is no evidence for axillary or internal mammary adenopathy and there are no additional findings.  IMPRESSION: No findings worrisome for recurrent tumor or developing malignancy.  RECOMMENDATION: Diagnostic mammography in 1 year.  THREE-DIMENSIONAL MR IMAGE RENDERING ON INDEPENDENT WORKSTATION:  Three-dimensional MR images were rendered by post-processing of the original MR data on an independent workstation.  The three- dimensional MR images were interpreted, and findings were reported in the accompanying complete MRI report for this study.  BI-RADS CATEGORY 1:  Negative.   Original Report Authenticated By: Rolla Plate, M.D.    Most recent bilateral mammogram at [facility] on 12/26/2011 was unremarkable.  Most recent bone density at [facility] on 07/19/2011 showed osteoporosis.    ASSESSMENT: 57 y.o. BRCA2 positive [place] woman   (1)  status post left lumpectomy and sentinel lymph node biopsy December 2008 for a T2 N0, grade 2, invasive ductal carcinoma, strongly estrogen and progesterone receptor positive, HER2 negative with an MIB1 of 17%, in the setting of a deleterious BRCA2 mutation; with an Oncotype DX recurrence score of 20 predicting a 10-year risk of recurrence of 13% after 5 years on tamoxifen.    (2)  had taken tamoxifen remotely for breast cancer prevention,   (3)  She started Arimidex April 2009. Therefore, her final risk of recurrence should be less than 13%.    (4) She underwent a TAH/BSO with benign pathology in  July 2009  PLAN:  This case was reviewed with Dr. Darnelle Catalan who also spoke extensively with the patient today. Elasha is doing extremely well with regards to her breast cancer, now 5 years out since beginning anastrozole.  We reviewed Momina's options: (1)  stopped the antiestrogen pills altogether; (2) stopped the anastrozole and start back on tamoxifen; or (3) continue on anastrozole, perhaps for another 5 years.  Since Dawnn has taken the tamoxifen in the past for prevention, she is hesitant to go back to the tamoxifen. She's also hesitant to stop the antiestrogen therapy altogether. She's tolerating the anastrozole extremely well, and her thoughts are that she would like to continue. We certainly think this is a reasonable decision. Accordingly, she will continue on the anastrozole, perhaps for another 5 years.  She is due for her next mammogram at [facility] in September, and we will add a BGSI as well. (Recall that she alternates yearly between BGSI and breast MRIs in addition to her annual mammograms.) She is due for her next bone density at [facility] in April 2015, and until that time will continue on Fosamax for her osteoporosis. We will reassess following her next bone density, and we'll decide whether or not to give her a "treatment holiday" from the Fosamax.  Kruti  was given all this information in writing today. She voices understanding and agreement with this plan. She knows to call for any problems that may develop before the next visit.   Calandria Mullings    07/14/2012

## 2012-07-16 ENCOUNTER — Other Ambulatory Visit (HOSPITAL_BASED_OUTPATIENT_CLINIC_OR_DEPARTMENT_OTHER): Payer: BC Managed Care – PPO | Admitting: Lab

## 2012-07-16 DIAGNOSIS — Z853 Personal history of malignant neoplasm of breast: Secondary | ICD-10-CM

## 2012-07-16 DIAGNOSIS — C50919 Malignant neoplasm of unspecified site of unspecified female breast: Secondary | ICD-10-CM

## 2012-07-16 LAB — COMPREHENSIVE METABOLIC PANEL (CC13)
ALT: 24 U/L (ref 0–55)
AST: 19 U/L (ref 5–34)
Alkaline Phosphatase: 82 U/L (ref 40–150)
BUN: 15.1 mg/dL (ref 7.0–26.0)
Chloride: 106 mEq/L (ref 98–107)
Creatinine: 0.9 mg/dL (ref 0.6–1.1)
Total Bilirubin: 0.42 mg/dL (ref 0.20–1.20)

## 2012-07-16 LAB — CBC WITH DIFFERENTIAL/PLATELET
BASO%: 0.8 % (ref 0.0–2.0)
Basophils Absolute: 0.1 10*3/uL (ref 0.0–0.1)
EOS%: 3.2 % (ref 0.0–7.0)
HCT: 40.4 % (ref 34.8–46.6)
HGB: 13.5 g/dL (ref 11.6–15.9)
LYMPH%: 28.5 % (ref 14.0–49.7)
MCH: 28.7 pg (ref 25.1–34.0)
MCHC: 33.5 g/dL (ref 31.5–36.0)
MCV: 85.6 fL (ref 79.5–101.0)
NEUT%: 60 % (ref 38.4–76.8)
Platelets: 228 10*3/uL (ref 145–400)

## 2012-07-17 ENCOUNTER — Ambulatory Visit (INDEPENDENT_AMBULATORY_CARE_PROVIDER_SITE_OTHER): Payer: BC Managed Care – PPO | Admitting: Surgery

## 2012-07-17 ENCOUNTER — Encounter (INDEPENDENT_AMBULATORY_CARE_PROVIDER_SITE_OTHER): Payer: Self-pay | Admitting: Surgery

## 2012-07-17 VITALS — BP 106/70 | HR 71 | Temp 97.6°F | Resp 16 | Ht 63.5 in | Wt 147.4 lb

## 2012-07-17 DIAGNOSIS — K629 Disease of anus and rectum, unspecified: Secondary | ICD-10-CM

## 2012-07-17 DIAGNOSIS — K6289 Other specified diseases of anus and rectum: Secondary | ICD-10-CM

## 2012-07-17 NOTE — Progress Notes (Signed)
Left buttock 1 cm from anal verge posteriorly is a pigmented nevus. We discussed removal here in the office or at [facility] day surgery. She preferred to have it removed here. The area was prepped with chlorhexidine. He was infiltrated with lidocaine with epinephrine and Neut. An ellipse was excised including the pigmented nevus and sent to pathology. The area was cauterized with silver nitrate and closed with a single 3-0 chromic. Neosporin on them was applied. We'll followup to discuss the path with her over the phone next week.

## 2012-08-08 ENCOUNTER — Encounter (INDEPENDENT_AMBULATORY_CARE_PROVIDER_SITE_OTHER): Payer: Self-pay | Admitting: Surgery

## 2012-12-25 ENCOUNTER — Other Ambulatory Visit: Payer: Self-pay | Admitting: Radiology

## 2012-12-25 DIAGNOSIS — Z803 Family history of malignant neoplasm of breast: Secondary | ICD-10-CM

## 2012-12-25 DIAGNOSIS — Z853 Personal history of malignant neoplasm of breast: Secondary | ICD-10-CM

## 2013-01-06 ENCOUNTER — Ambulatory Visit
Admission: RE | Admit: 2013-01-06 | Discharge: 2013-01-06 | Disposition: A | Discharge status: Home / Self Care | Payer: BC Managed Care – PPO | Source: Ambulatory Visit | Attending: Radiology | Admitting: Radiology

## 2013-01-06 DIAGNOSIS — Z853 Personal history of malignant neoplasm of breast: Secondary | ICD-10-CM

## 2013-01-06 DIAGNOSIS — Z803 Family history of malignant neoplasm of breast: Secondary | ICD-10-CM

## 2013-01-06 MED ORDER — GADOBENATE DIMEGLUMINE 529 MG/ML IV SOLN
13.0000 mL | Freq: Once | INTRAVENOUS | Status: AC | PRN
  Administered 2013-01-06: 13 mL via INTRAVENOUS

## 2013-01-09 ENCOUNTER — Telehealth: Payer: Self-pay | Admitting: *Deleted

## 2013-01-09 NOTE — Telephone Encounter (Signed)
Pt called to have her labs moved to the am for 10.23.14. gv time for 9:15am on 10.23.14.pt is aware...td

## 2013-01-20 ENCOUNTER — Telehealth: Payer: Self-pay | Admitting: Oncology

## 2013-01-20 NOTE — Telephone Encounter (Signed)
pt wanted labs the same day as visit...done

## 2013-01-22 ENCOUNTER — Other Ambulatory Visit: Payer: BC Managed Care – PPO | Admitting: Lab

## 2013-01-22 ENCOUNTER — Other Ambulatory Visit: Payer: BC Managed Care – PPO

## 2013-01-29 ENCOUNTER — Other Ambulatory Visit (HOSPITAL_BASED_OUTPATIENT_CLINIC_OR_DEPARTMENT_OTHER): Payer: BC Managed Care – PPO | Admitting: Lab

## 2013-01-29 ENCOUNTER — Ambulatory Visit (HOSPITAL_BASED_OUTPATIENT_CLINIC_OR_DEPARTMENT_OTHER): Payer: BC Managed Care – PPO | Admitting: Oncology

## 2013-01-29 ENCOUNTER — Telehealth: Payer: Self-pay | Admitting: *Deleted

## 2013-01-29 VITALS — BP 125/69 | HR 65 | Temp 98.2°F | Resp 18 | Ht 63.5 in | Wt 144.6 lb

## 2013-01-29 DIAGNOSIS — C50412 Malignant neoplasm of upper-outer quadrant of left female breast: Secondary | ICD-10-CM

## 2013-01-29 DIAGNOSIS — M810 Age-related osteoporosis without current pathological fracture: Secondary | ICD-10-CM

## 2013-01-29 DIAGNOSIS — Z853 Personal history of malignant neoplasm of breast: Secondary | ICD-10-CM

## 2013-01-29 DIAGNOSIS — Z1501 Genetic susceptibility to malignant neoplasm of breast: Secondary | ICD-10-CM

## 2013-01-29 LAB — CBC WITH DIFFERENTIAL/PLATELET
BASO%: 0.9 % (ref 0.0–2.0)
Basophils Absolute: 0.1 10*3/uL (ref 0.0–0.1)
EOS%: 4.1 % (ref 0.0–7.0)
HCT: 39.6 % (ref 34.8–46.6)
HGB: 13.1 g/dL (ref 11.6–15.9)
MCH: 28.4 pg (ref 25.1–34.0)
MCHC: 33.1 g/dL (ref 31.5–36.0)
MCV: 85.7 fL (ref 79.5–101.0)
MONO%: 6.4 % (ref 0.0–14.0)
NEUT%: 60.1 % (ref 38.4–76.8)
RDW: 12.9 % (ref 11.2–14.5)

## 2013-01-29 LAB — COMPREHENSIVE METABOLIC PANEL (CC13)
ALT: 25 U/L (ref 0–55)
AST: 24 U/L (ref 5–34)
Alkaline Phosphatase: 84 U/L (ref 40–150)
Anion Gap: 11 mEq/L (ref 3–11)
BUN: 17.6 mg/dL (ref 7.0–26.0)
Creatinine: 1 mg/dL (ref 0.6–1.1)
Potassium: 4.1 mEq/L (ref 3.5–5.1)
Sodium: 142 mEq/L (ref 136–145)
Total Bilirubin: 0.38 mg/dL (ref 0.20–1.20)
Total Protein: 7.5 g/dL (ref 6.4–8.3)

## 2013-01-29 NOTE — Telephone Encounter (Signed)
appts made and printed...td 

## 2013-01-29 NOTE — Progress Notes (Signed)
ID: Harnoor Reta Retter   DOB: 01/11/1956  MR#: [id number]  JYN#:[id number]  PCP: Enrique Sack, MD GYN: Hilary Hertz SU: Luretha Murphy MD OTHER MD: Archer Asa, Danise Edge, Hoope Gruve   HISTORY OF PRESENT ILLNESS: Kieana had an unremarkable mammogram at [facility] on December 16, 2006.  Her breasts are dense, and of course that decreases the sensitivity.  In any case the screening mammogram was read as unremarkable.  In December, however, Takeia had her annual examination under [facility]oy[facility] Parkins, and Dr. Rosalio Macadamia palpated a lump in the left breast at the 3 o'clock position.  The patient was referred back for ultrasound, and this showed a hypoechoic mass measuring 1.4 cm corresponding to the palpable abnormality.  Going back to the September mammogram, Dr. Yolanda Bonine was able to tease out what possibly could have been a mammographic finding, but really it was so equivocal, that even in retrospect, the mammogram was not informative.   This was followed up with breast specific gamma imaging on March 11, 2007, and this found a solitary lesion, measuring up to 1.7 cm.  In addition, there was a 1.0 cm low intensity uptake area in the axilla, considered an indeterminate finding.    With this information, the patient proceeded to biopsy under ultrasound guidance of the mass.  This was performed on December 9, and it showed an invasive ductal carcinoma, which was ER positive at 100%, PR positive at 56%, with a borderline/low proliferation marker at 17%, and Hercept test negative at 1+.  Raianna was then referred to Dr. Daphine Deutscher for further evaluation, and direction for definitive surgery, and she underwent left lumpectomy and sentinel lymph node biopsy March 13, 2007 for what proved to be ([id number]) a 2.1 cm invasive ductal carcinoma with some micro-papillary features, grade 2, with evidence of lymphovascular invasion, but zero of two sentinel lymph nodes involved.  Margins were adequate for both the  invasive and in situ components. Her subsequent history is as detailed below.  INTERVAL HISTORY: Fenna returns today for followup of her left breast cancer.  The interval history is generally unremarkable. She is tolerating the anastrozole generally well, with vaginal dryness as the chief concern.  REVIEW OF SYSTEMS: Sreenidhi as always have problems with insomnia and that has not changed recently. Hot flashes are "not too bad". She is exercising regularly and her mood has been stable to positive. A detailed review of systems today was otherwise noncontributory.   PAST MEDICAL HISTORY: Past Medical History  Diagnosis Date  . Goiter   . Osteoporosis   . Dyslipidemia   . Anxiety disorder   . BRCA2 positive 07/14/2012    PAST SURGICAL HISTORY: Past Surgical History  Procedure Laterality Date  . Breast lumpectomy  2008  . Abdominal hysterectomy  2009    with BSO    FAMILY HISTORY (updated OCT 2013) Family History  Problem Relation Age of Onset  . Cancer Mother   The patient's father is currently 44, with a remote history of colon cancer.  The patient's mother died at the age of 41.  She had a history of breast cancer and the patient's mother's mother died from cancer, but the actual original site of cancer is unclear.  The patient has several stepbrothers and sisters, but only one half-sister, currently 42,with no history of cancer.   GYNECOLOGIC HISTORY: She is GX, P2.  First pregnancy age 22.  Last menstrual period age 32.  She did not take hormone replacement.    SOCIAL HISTORY: (updated  OCT 2013) Of course she is [name]. He has 4 children from an earlier marriage. She has two from hers, a son who works at [employer] in [place], and a  son who lives in [place]. They have 3 grandchildren.   ADVANCED DIRECTIVES: in place  HEALTH MAINTENANCE: History  Substance Use Topics  . Smoking status: Former Smoker    Types: Cigarettes  . Smokeless tobacco: Never Used  . Alcohol  Use: Yes     Comment: GLASS OF WINE MOST DAYS     Colonoscopy: August 2012  PAP: April 2014  Bone density: 2013 AT [facility]/ stable osteoporosis  Lipid panel:  Allergies  Allergen Reactions  . Ampicillin   . Latex     Current Outpatient Prescriptions  Medication Sig Dispense Refill  . alendronate (FOSAMAX) 70 MG tablet       . anastrozole (ARIMIDEX) 1 MG tablet Take 1 tablet (1 mg total) by mouth daily.  90 tablet  3  . clorazepate (TRANXENE) 7.5 MG tablet       . halobetasol (ULTRAVATE) 0.05 % ointment       . hydrocortisone (ANUSOL-HC) 25 MG suppository       . levothyroxine (SYNTHROID, LEVOTHROID) 125 MCG tablet Take 125 mcg by mouth daily.        . Probiotic Product (PROBIOTIC DAILY PO) Take 1 each by mouth daily.      . rosuvastatin (CRESTOR) 5 MG tablet Take 5 mg by mouth daily.      . temazepam (RESTORIL) 7.5 MG capsule Take 7.5 mg by mouth at bedtime as needed.        . tretinoin (RETIN-A) 0.025 % cream       . Zolpidem Tartrate (AMBIEN PO) Take by mouth.         No current facility-administered medications for this visit.    OBJECTIVE: Middle-aged white woman in no acute distress Filed Vitals:   01/29/13 1345  BP: 125/69  Pulse: 65  Temp: 98.2 F (36.8 C)  Resp: 18     Body mass index is 25.21 kg/(m^2).    ECOG FS: 1 Filed Weights   01/29/13 1345  Weight: 144 lb 9.6 oz (65.59 kg)   Sclerae unicteric, pupils equal round and reactive Oropharynx no thrush or other lesions No cervical or supraclavicular adenopathy Lungs clear to auscultation bilaterally, no rales or rhonchi Heart regular rate and rhythm Abdomen is soft, nontender, positive bowel sounds MSK no focal spinal tenderness, no upper extremity lymphedema Neuro: nonfocal, well oriented, positive affect Breasts: The right breast is unremarkable. The left breast is status post lumpectomy and radiation. There is no evidence of local recurrence. The left axilla is benign  LAB RESULTS: Lab Results   Component Value Date   WBC 7.5 07/16/2012   NEUTROABS 4.5 07/16/2012   HGB 13.5 07/16/2012   HCT 40.4 07/16/2012   MCV 85.6 07/16/2012   PLT 228 07/16/2012      Chemistry      Component Value Date/Time   NA 141 07/16/2012 1201   NA 140 01/22/2011 1408   K 4.0 07/16/2012 1201   K 4.2 01/22/2011 1408   CL 106 07/16/2012 1201   CL 104 01/22/2011 1408   CO2 25 07/16/2012 1201   CO2 26 01/22/2011 1408   BUN 15.1 07/16/2012 1201   BUN 15 01/22/2011 1408   CREATININE 0.9 07/16/2012 1201   CREATININE 0.84 01/22/2011 1408      Component Value Date/Time   CALCIUM  10.0 07/16/2012 1201   CALCIUM 10.0 01/22/2011 1408   ALKPHOS 82 07/16/2012 1201   ALKPHOS 60 01/22/2011 1408   AST 19 07/16/2012 1201   AST 24 01/22/2011 1408   ALT 24 07/16/2012 1201   ALT 22 01/22/2011 1408   BILITOT 0.42 07/16/2012 1201   BILITOT 0.4 01/22/2011 1408       Lab Results  Component Value Date   LABCA2 20 01/09/2012      STUDIES: Mr Breast Bilateral W Wo Contrast  01/08/2013   *RADIOLOGY REPORT*  Clinical Data:57 year old female with personal history of left breast cancer, left lumpectomy and treatment in 2008/2009, and family history of breast cancer.  BILATERAL BREAST MRI WITH AND WITHOUT CONTRAST  Technique: Multiplanar, multisequence MR images of both breasts were obtained prior to and following the intravenous administration of 13ml of Multihance.  THREE-DIMENSIONAL MR IMAGE RENDERING ON INDEPENDENT WORKSTATION: Three-dimensional MR images were rendered by post-processing  of the original MR data on an independent DynaCad workstation. The three-dimensional MR images were interpreted, and findings are reported in the following complete MRI report for this study.  Comparison:  Prior MRIs dating back to 12/23/2007.  Recent and prior mammograms from [place].  FINDINGS:  Breast composition:  c:  Heterogeneous fibroglandular tissue  Background parenchymal enhancement: Mild  Right breast:  No suspicious areas of  enhancement are identified to suggest malignancy.  Left breast:  Scarring within the left breast again identified.  No suspicious areas of enhancement are identified to suggest malignancy.  Lymph nodes:  No abnormal appearing lymph nodes.  Ancillary findings:  Cardiomegaly is present.  IMPRESSION: No MR evidence of breast malignancy.  Left breast scarring.  RECOMMENDATION: Annual bilateral screening mammograms and breast MRI.  BI-RADS CATEGORY 2:  Benign finding(s).   Original Report Authenticated By: Harmon Pier, M.D.    Bilateral mammogram at [facility] on 01/01/2013 was unremarkable.  Bone density at [facility] on 07/19/2011 showed osteoporosis (T-2.9) (improved)    ASSESSMENT: 57 y.o. BRCA2 positive [place] woman   (1)  status post left lumpectomy and sentinel lymph node biopsy December 2008 for a T2 N0, grade 2, invasive ductal carcinoma, strongly estrogen and progesterone receptor positive, HER2 negative with an MIB1 of 17%, in the setting of a deleterious BRCA2 mutation; with an Oncotype DX recurrence score of 20 predicting a 10-year risk of recurrence of 13% after 5 years on tamoxifen.    (2)  had taken tamoxifen for 5 years remotely for breast cancer prevention,   (3)  She started Arimidex April 2009. Therefore, her final risk of recurrence should be less than 13%.    (4) She underwent a TAH/BSO with benign pathology in July 2009  PLAN:  We discussed 2 issues in detail today. One refers to the use of MRI for followup. This is standard in patients who are BRCA positive. The problem is that her insurance has a very high to Dr. Maryclare Labrador so she attends the pain the entire price out-of-pocket. Even though the standard of care is for yearly MRI, we do not have data that is prolong survival. We were doing breast specific gamma imaging on alternative years, but that option is not available at present. After much discussion, and taking into account the new availability of tomography, which increases  mammographic sensitivity, we decided we would do breast MRIs every other year. Accordingly she will not have a breast MRI in 2015, but she will have one and 2016.  The other issue is what to do regarding  her antiestrogen therapy. She took tamoxifen for 5 years previously and has received 4-1/2 years of aromatase inhibitors to this point. We do not have data to tell us whether continuing anti-estrogens beyond 10 years makes any difference or not. However she is very reluctant to did not take anti-estrogens of 1 sort or another  If we are going to continue on the estrogens, then switching back to tamoxifen I think may be a better choice for her. She status post hysterectomy, so we don't have to worry about endometrial cancer. She took tamoxifen previously for 5 years with no clotting complications. The advantage of tamoxifen is not only that it would tend to strengthen her bones is that of weakening them, but that it would allow her to take your of the vaginal dryness problem with local estrogen preparations like Vagifem or Estring. Tamoxifen blocks the estrogen receptor, so the amount of circulating estrogen does not affect the mechanism of action. This is why tamoxifen is used in premenopausal women.  In other words, even though some small amount of estrogen will be absorbed transvaginally, this will make no difference to the effectiveness of tamoxifen. In addition, we do have evidence that patients to take tamoxifen for 10 years and the additional benefit in terms of risk reduction is compared to those of stop tamoxifen at 5 years  After much discussion what we are going to do then is stop the anastrozole now, and start tamoxifen in January. Once it has built up sufficiently in her system, namely sometime in February, she can start Vagifem suppositories, and frequently patient's start 3-4 times a week, then as the vaginal lining becomes healthier taken dropped to twice a week, or once a week, or in some  unusual cases as little as once a month, with very good results.  I am entering the tamoxifen and Vagifem prescriptions. She understands how they should be used. She knows to call with any questions or concerns. Otherwise I will see her again in a year, after her 12 mammography.   Dayrin Stallone C    01/29/2013

## 2013-01-31 MED ORDER — TAMOXIFEN CITRATE 20 MG PO TABS: ORAL_TABLET | ORAL | Status: DC

## 2013-01-31 MED ORDER — ESTRADIOL 10 MCG VA TABS: 1.0000 | ORAL_TABLET | VAGINAL | Status: DC

## 2013-02-07 ENCOUNTER — Other Ambulatory Visit: Payer: Self-pay | Admitting: Oncology

## 2013-02-23 ENCOUNTER — Other Ambulatory Visit: Payer: Self-pay | Admitting: *Deleted

## 2013-04-02 HISTORY — PX: FRACTURE SURGERY: SHX138

## 2013-04-06 ENCOUNTER — Other Ambulatory Visit: Payer: Self-pay | Admitting: Physician Assistant

## 2013-04-06 DIAGNOSIS — C50412 Malignant neoplasm of upper-outer quadrant of left female breast: Secondary | ICD-10-CM

## 2013-04-09 ENCOUNTER — Other Ambulatory Visit: Payer: Self-pay | Admitting: *Deleted

## 2013-04-09 NOTE — Telephone Encounter (Signed)
This RN called prescriptions for tamoxifen and vagifem per pt's call stating prescriptions are not available at her pharmacy.  This RN contacted pt's pharmacy and verified not on file.  Verbally gave prescription for tamoxifen and vagifem per MD order.  This RN then called [facility] Hurry and discussed above.  Per phone conversation pt understands to start the tamoxfen ASAP and then may start the vagifem in approximately 6 weeks.

## 2013-07-29 ENCOUNTER — Ambulatory Visit
Admission: RE | Admit: 2013-07-29 | Discharge: 2013-07-29 | Disposition: A | Discharge status: Home / Self Care | Payer: BC Managed Care – PPO | Source: Ambulatory Visit | Attending: Internal Medicine | Admitting: Internal Medicine

## 2013-07-29 ENCOUNTER — Other Ambulatory Visit: Payer: Self-pay | Admitting: Internal Medicine

## 2013-07-29 DIAGNOSIS — R0781 Pleurodynia: Secondary | ICD-10-CM

## 2013-08-01 ENCOUNTER — Other Ambulatory Visit: Payer: Self-pay | Admitting: Oncology

## 2013-09-01 ENCOUNTER — Telehealth: Payer: Self-pay | Admitting: *Deleted

## 2013-09-01 NOTE — Telephone Encounter (Signed)
This RN returned call to pt per her message inquiring about dosage of vagifem.  " the label says daily but I thought Dr Jana Hakim told me to do it 2 times a week "  Obtained identified VM- message left informing pt 2 times a week is correct. Ordered " as directed "- with Dr Gerarda Fraction as directed as above - not daily.  This RN's name and return call number given.

## 2013-09-20 ENCOUNTER — Telehealth: Payer: Self-pay | Admitting: Oncology

## 2013-09-20 NOTE — Telephone Encounter (Signed)
per GM to r/s-will ad pt of new time & date-will mail copy of sch

## 2013-09-21 ENCOUNTER — Telehealth: Payer: Self-pay | Admitting: Oncology

## 2013-09-21 NOTE — Telephone Encounter (Signed)
cld & spoke to pt to give r/s time & date for appt-pt understood

## 2013-09-22 ENCOUNTER — Telehealth: Payer: Self-pay

## 2013-09-22 NOTE — Telephone Encounter (Signed)
Returned pt call.  Per Micah Flesher, ok for patient to stop fosamax for 6 months as requested by her orthopedist.  Pt voiced understanding.

## 2014-01-25 ENCOUNTER — Other Ambulatory Visit: Payer: BC Managed Care – PPO

## 2014-02-01 ENCOUNTER — Ambulatory Visit: Payer: BC Managed Care – PPO | Admitting: Oncology

## 2014-02-03 ENCOUNTER — Other Ambulatory Visit: Payer: Self-pay | Admitting: *Deleted

## 2014-02-03 DIAGNOSIS — C50412 Malignant neoplasm of upper-outer quadrant of left female breast: Secondary | ICD-10-CM

## 2014-02-04 ENCOUNTER — Other Ambulatory Visit (HOSPITAL_BASED_OUTPATIENT_CLINIC_OR_DEPARTMENT_OTHER): Payer: BC Managed Care – PPO

## 2014-02-04 DIAGNOSIS — C50412 Malignant neoplasm of upper-outer quadrant of left female breast: Secondary | ICD-10-CM

## 2014-02-04 DIAGNOSIS — C50912 Malignant neoplasm of unspecified site of left female breast: Secondary | ICD-10-CM

## 2014-02-04 LAB — CBC WITH DIFFERENTIAL/PLATELET
BASO%: 0.7 % (ref 0.0–2.0)
Basophils Absolute: 0 10*3/uL (ref 0.0–0.1)
EOS%: 3.1 % (ref 0.0–7.0)
Eosinophils Absolute: 0.2 10*3/uL (ref 0.0–0.5)
HCT: 35.9 % (ref 34.8–46.6)
HGB: 11.9 g/dL (ref 11.6–15.9)
LYMPH%: 36.1 % (ref 14.0–49.7)
MCH: 29 pg (ref 25.1–34.0)
MCHC: 33.1 g/dL (ref 31.5–36.0)
MCV: 87.6 fL (ref 79.5–101.0)
MONO#: 0.4 10*3/uL (ref 0.1–0.9)
MONO%: 6.6 % (ref 0.0–14.0)
NEUT#: 3.3 10*3/uL (ref 1.5–6.5)
NEUT%: 53.5 % (ref 38.4–76.8)
PLATELETS: 240 10*3/uL (ref 145–400)
RBC: 4.1 10*6/uL (ref 3.70–5.45)
RDW: 12.6 % (ref 11.2–14.5)
WBC: 6.1 10*3/uL (ref 3.9–10.3)
lymph#: 2.2 10*3/uL (ref 0.9–3.3)

## 2014-02-04 LAB — COMPREHENSIVE METABOLIC PANEL (CC13)
ALK PHOS: 40 U/L (ref 40–150)
ALT: 19 U/L (ref 0–55)
AST: 19 U/L (ref 5–34)
Albumin: 4.2 g/dL (ref 3.5–5.0)
Anion Gap: 8 mEq/L (ref 3–11)
BILIRUBIN TOTAL: 0.29 mg/dL (ref 0.20–1.20)
BUN: 18.7 mg/dL (ref 7.0–26.0)
CO2: 27 meq/L (ref 22–29)
Calcium: 10.2 mg/dL (ref 8.4–10.4)
Chloride: 105 mEq/L (ref 98–109)
Creatinine: 1 mg/dL (ref 0.6–1.1)
Glucose: 94 mg/dl (ref 70–140)
Potassium: 4.1 mEq/L (ref 3.5–5.1)
SODIUM: 140 meq/L (ref 136–145)
TOTAL PROTEIN: 7.1 g/dL (ref 6.4–8.3)

## 2014-02-08 ENCOUNTER — Other Ambulatory Visit: Payer: Self-pay | Admitting: Oncology

## 2014-02-08 DIAGNOSIS — C50412 Malignant neoplasm of upper-outer quadrant of left female breast: Secondary | ICD-10-CM

## 2014-02-11 ENCOUNTER — Ambulatory Visit (HOSPITAL_BASED_OUTPATIENT_CLINIC_OR_DEPARTMENT_OTHER): Payer: BC Managed Care – PPO | Admitting: Oncology

## 2014-02-11 ENCOUNTER — Telehealth: Payer: Self-pay | Admitting: Oncology

## 2014-02-11 ENCOUNTER — Other Ambulatory Visit: Payer: BC Managed Care – PPO

## 2014-02-11 VITALS — BP 101/72 | HR 72 | Temp 98.4°F | Resp 20 | Ht 63.5 in | Wt 135.2 lb

## 2014-02-11 DIAGNOSIS — C50412 Malignant neoplasm of upper-outer quadrant of left female breast: Secondary | ICD-10-CM

## 2014-02-11 DIAGNOSIS — Z1501 Genetic susceptibility to malignant neoplasm of breast: Secondary | ICD-10-CM

## 2014-02-11 DIAGNOSIS — C50812 Malignant neoplasm of overlapping sites of left female breast: Secondary | ICD-10-CM

## 2014-02-11 DIAGNOSIS — Z1509 Genetic susceptibility to other malignant neoplasm: Secondary | ICD-10-CM

## 2014-02-11 DIAGNOSIS — Z170 Estrogen receptor positive status [ER+]: Secondary | ICD-10-CM

## 2014-02-11 NOTE — Progress Notes (Signed)
ID: [name]   DOB: 02/14/1956  MR#: [id number]  CSN#:[id number]  PCP: GREEN, EDWIN JAY, MD GYN: V. Mody SU: Matthew Martin MD OTHER MD: Gerald Plovsky, Martin Johnson, Hoope Gruber   HISTORY OF PRESENT ILLNESS: From the original intake note:   [name] had an unremarkable mammogram at [facility] on December 16, 2006.  Her breasts are dense, and of course that decreases the sensitivity.  In any case the screening mammogram was read as unremarkable.  In December, however, [name] had her annual examination under [name], and Dr. Dickstein palpated a lump in the left breast at the 3 o'clock position.  The patient was referred back for ultrasound, and this showed a hypoechoic mass measuring 1.4 cm corresponding to the palpable abnormality.  Going back to the September mammogram, Dr. [facility] was able to tease out what possibly could have been a mammographic finding, but really it was so equivocal, that even in retrospect, the mammogram was not informative.   This was followed up with breast specific gamma imaging on March 11, 2007, and this found a solitary lesion, measuring up to 1.7 cm.  In addition, there was a 1.0 cm low intensity uptake area in the axilla, considered an indeterminate finding.    With this information, the patient proceeded to biopsy under ultrasound guidance of the mass.  This was performed on December 9, and it showed an invasive ductal carcinoma, which was ER positive at 100%, PR positive at 56%, with a borderline/low proliferation marker at 17%, and Hercept test negative at 1+.  [name] was then referred to Dr. Martin for further evaluation, and direction for definitive surgery, and she underwent left lumpectomy and sentinel lymph node biopsy March 13, 2007 for what proved to be ([id number]) a 2.1 cm invasive ductal carcinoma with some micro-papillary features, grade 2, with evidence of lymphovascular invasion, but zero of two sentinel lymph nodes involved.   Margins were adequate for both the invasive and in situ components.   Her subsequent history is as detailed below.  INTERVAL HISTORY: [name] returns today for followup of her left breast cancer.  The interval history is significant first of all for her having switched to tamoxifen as of January 2015. She is tolerating that remarkably well. She only gets hot flashes when she concentrates as when playing Bridge for instance. She is using the vaginal suppositories once a week if that, not twice a week. Accordingly she has not had much success there. She continues to be very worried about using any estrogen at all even though she understands that in my opinion she is safe using local estrogen preparations while on tamoxifen. The other event since the last visit here is her fall early this year, with a left hip fracture. She underwent physical therapy but did not follow-up through on that because her Rohlfer felt the exercises were contra productive. Instead she continues to get Rohlfing massage under Cheryl Dalton. Finally she "got her son married" 2 weeks ago. The event was outdoors in [place] and from her description very successful.  REVIEW OF SYSTEMS: [name] still has some pain in her left hip. She has a small goiter, which is unchanged. A detailed review of systems today was otherwise noncontributory   PAST MEDICAL HISTORY: Past Medical History  Diagnosis Date  . Goiter   . Osteoporosis   . Dyslipidemia   . Anxiety disorder   . BRCA2 positive 07/14/2012    PAST SURGICAL HISTORY: Past Surgical History  Procedure Laterality Date  .   Breast lumpectomy  2008  . Abdominal hysterectomy  2009    with BSO    FAMILY HISTORY (updated OCT 2013) Family History  Problem Relation Age of Onset  . Cancer Mother   The patient's father is currently 23, with a remote history of colon cancer.  The patient's mother died at the age of 63.  She had a history of breast cancer and the patient's mother's  mother died from cancer, but the actual original site of cancer is unclear.  The patient has several stepbrothers and sisters, but only one half-sister, currently 42,with no history of cancer.   GYNECOLOGIC HISTORY: She is GX, P2.  First pregnancy age 50.  Last menstrual period age 89.  She did not take hormone replacement.    SOCIAL HISTORY: (updated OCT 2013) Of course she is [name]. He has 4 children from an earlier marriage. She has two from hers, a son who works at [employer] in [place], and a  son who lives in [place]. They have 3 grandchildren.   ADVANCED DIRECTIVES: in place  HEALTH MAINTENANCE: History  Substance Use Topics  . Smoking status: Former Smoker    Types: Cigarettes  . Smokeless tobacco: Never Used  . Alcohol Use: Yes     Comment: GLASS OF WINE MOST DAYS     Colonoscopy: August 2012  PAP: April 2014  Bone density: 2013 AT [facility]/ stable osteoporosis  Lipid panel:  Allergies  Allergen Reactions  . Ampicillin   . Latex     Current Outpatient Prescriptions  Medication Sig Dispense Refill  . alendronate (FOSAMAX) 70 MG tablet     . clorazepate (TRANXENE) 7.5 MG tablet     . Estradiol (VAGIFEM) 10 MCG TABS vaginal tablet Place 1 tablet (10 mcg total) vaginally as directed. 24 tablet 12  . halobetasol (ULTRAVATE) 0.05 % ointment     . hydrocortisone (ANUSOL-HC) 25 MG suppository     . levothyroxine (SYNTHROID, LEVOTHROID) 125 MCG tablet Take 125 mcg by mouth daily.      . Probiotic Product (PROBIOTIC DAILY PO) Take 1 each by mouth daily.    . rosuvastatin (CRESTOR) 5 MG tablet Take 5 mg by mouth daily.    . tamoxifen (NOLVADEX) 20 MG tablet TAKE ONE TABLET BY MOUTH DAILY 90 tablet 4  . temazepam (RESTORIL) 7.5 MG capsule Take 7.5 mg by mouth at bedtime as needed.      . tretinoin (RETIN-A) 0.025 % cream     . Zolpidem Tartrate (AMBIEN PO) Take by mouth.       No current facility-administered medications for this visit.    OBJECTIVE: Middle-aged  white woman who appears well Filed Vitals:   02/11/14 1517  BP: 101/72  Pulse: 72  Temp: 98.4 F (36.9 C)  Resp: 20     Body mass index is 23.57 kg/(m^2).    ECOG FS: 0 Filed Weights   02/11/14 1517  Weight: 135 lb 3.2 oz (61.326 kg)   Sclerae unicteric, EOMs intact Oropharynx clear, dentition in good repair No cervical or supraclavicular adenopathy Lungs no rales or rhonchi Heart regular rate and rhythm Abdomen soft, nontender, positive bowel sounds MSK no focal spinal tenderness, no upper extremity lymphedema Neuro: nonfocal, well oriented, positive affect Breasts: The right breast is unremarkable. The left breast is status post lumpectomy and radiation. There is no evidence of local recurrence. The left axilla is benign  LAB RESULTS: Lab Results  Component Value Date   WBC 6.1 02/04/2014  NEUTROABS 3.3 02/04/2014   HGB 11.9 02/04/2014   HCT 35.9 02/04/2014   MCV 87.6 02/04/2014   PLT 240 02/04/2014      Chemistry      Component Value Date/Time   NA 140 02/04/2014 1519   NA 140 01/22/2011 1408   K 4.1 02/04/2014 1519   K 4.2 01/22/2011 1408   CL 106 07/16/2012 1201   CL 104 01/22/2011 1408   CO2 27 02/04/2014 1519   CO2 26 01/22/2011 1408   BUN 18.7 02/04/2014 1519   BUN 15 01/22/2011 1408   CREATININE 1.0 02/04/2014 1519   CREATININE 0.84 01/22/2011 1408      Component Value Date/Time   CALCIUM 10.2 02/04/2014 1519   CALCIUM 10.0 01/22/2011 1408   ALKPHOS 40 02/04/2014 1519   ALKPHOS 60 01/22/2011 1408   AST 19 02/04/2014 1519   AST 24 01/22/2011 1408   ALT 19 02/04/2014 1519   ALT 22 01/22/2011 1408   BILITOT 0.29 02/04/2014 1519   BILITOT 0.4 01/22/2011 1408       Lab Results  Component Value Date   LABCA2 20 01/09/2012      STUDIES: Mammography at [facility] last week was unremarkable per patient (I have not seen the report yet). Breast MRI is pending  ASSESSMENT: 58 y.o. BRCA2 positive [place] woman   (1)  status post left lumpectomy  and sentinel lymph node biopsy December 2008 for a T2 N0, grade 2, invasive ductal carcinoma, strongly estrogen and progesterone receptor positive, HER2 negative with an MIB1 of 17%, in the setting of a deleterious BRCA2 mutation; with an Oncotype DX recurrence score of 20 predicting a 10-year risk of recurrence of 13% after 5 years on tamoxifen.    (2)  had taken tamoxifen for 5 years remotely for breast cancer prevention,   (3)  She started Arimidex April 2009. Therefore, her final risk of recurrence should be less than 13%.    (4) switched to tamoxifen January 2015  (5) She underwent a TAH/BSO with benign pathology in July 2009  PLAN:  Chyrel is doing fine from a breast cancer point of view and particularly is tolerating tamoxifen well. The plan is to continue that for at least another 4 years and then reassess.  I would prefer to obtain a breast MRI every year if possible so I am going ahead and setting that up for her next week. She a ready had her mammogram last week which was unremarkable.  I again reassured her that I feel it is very safe for her to use vaginal estrogen if she wishes, and we discussed Estring. At this point she once to continue with what she is doing  Roselynne will return to see me in a year. She knows to call for any problems that may develop before that visit.  Kaleem Sartwell C    02/11/2014

## 2014-02-11 NOTE — Telephone Encounter (Signed)
per pof to sch pt appt-adv pt to call GI to sch MRI per GI to ask questions b4 sch-gave pt copyof sch

## 2014-02-12 NOTE — Addendum Note (Signed)
Addended by: Laureen Abrahams on: 02/12/2014 07:12 PM   Modules accepted: Orders, Medications

## 2014-02-22 ENCOUNTER — Ambulatory Visit
Admission: RE | Admit: 2014-02-22 | Discharge: 2014-02-22 | Disposition: A | Discharge status: Home / Self Care | Payer: BC Managed Care – PPO | Source: Ambulatory Visit | Attending: Oncology | Admitting: Oncology

## 2014-02-22 DIAGNOSIS — C50412 Malignant neoplasm of upper-outer quadrant of left female breast: Secondary | ICD-10-CM

## 2014-02-22 DIAGNOSIS — Z1501 Genetic susceptibility to malignant neoplasm of breast: Secondary | ICD-10-CM

## 2014-02-22 DIAGNOSIS — Z1509 Genetic susceptibility to other malignant neoplasm: Secondary | ICD-10-CM

## 2014-02-22 MED ORDER — GADOBENATE DIMEGLUMINE 529 MG/ML IV SOLN
12.0000 mL | Freq: Once | INTRAVENOUS | Status: AC | PRN
  Administered 2014-02-22: 12 mL via INTRAVENOUS

## 2014-06-01 ENCOUNTER — Other Ambulatory Visit: Payer: Self-pay | Admitting: Internal Medicine

## 2014-06-01 DIAGNOSIS — N3090 Cystitis, unspecified without hematuria: Secondary | ICD-10-CM

## 2014-06-03 ENCOUNTER — Other Ambulatory Visit: Payer: Self-pay | Admitting: Internal Medicine

## 2014-06-03 ENCOUNTER — Ambulatory Visit
Admission: RE | Admit: 2014-06-03 | Discharge: 2014-06-03 | Disposition: A | Discharge status: Home / Self Care | Payer: BLUE CROSS/BLUE SHIELD | Source: Ambulatory Visit | Attending: Internal Medicine | Admitting: Internal Medicine

## 2014-06-03 DIAGNOSIS — R1031 Right lower quadrant pain: Secondary | ICD-10-CM

## 2014-06-03 DIAGNOSIS — R1032 Left lower quadrant pain: Secondary | ICD-10-CM

## 2014-06-03 DIAGNOSIS — N3090 Cystitis, unspecified without hematuria: Secondary | ICD-10-CM

## 2015-02-11 ENCOUNTER — Other Ambulatory Visit: Payer: Self-pay

## 2015-02-11 DIAGNOSIS — C50412 Malignant neoplasm of upper-outer quadrant of left female breast: Secondary | ICD-10-CM

## 2015-02-14 ENCOUNTER — Other Ambulatory Visit (HOSPITAL_BASED_OUTPATIENT_CLINIC_OR_DEPARTMENT_OTHER): Payer: BLUE CROSS/BLUE SHIELD

## 2015-02-14 DIAGNOSIS — C50412 Malignant neoplasm of upper-outer quadrant of left female breast: Secondary | ICD-10-CM | POA: Diagnosis not present

## 2015-02-14 LAB — CBC WITH DIFFERENTIAL/PLATELET
BASO%: 0.7 % (ref 0.0–2.0)
BASOS ABS: 0 10*3/uL (ref 0.0–0.1)
EOS%: 1.8 % (ref 0.0–7.0)
Eosinophils Absolute: 0.1 10*3/uL (ref 0.0–0.5)
HCT: 37.8 % (ref 34.8–46.6)
HGB: 12.5 g/dL (ref 11.6–15.9)
LYMPH%: 28.5 % (ref 14.0–49.7)
MCH: 29 pg (ref 25.1–34.0)
MCHC: 33 g/dL (ref 31.5–36.0)
MCV: 87.7 fL (ref 79.5–101.0)
MONO#: 0.4 10*3/uL (ref 0.1–0.9)
MONO%: 5.8 % (ref 0.0–14.0)
NEUT#: 4 10*3/uL (ref 1.5–6.5)
NEUT%: 63.2 % (ref 38.4–76.8)
Platelets: 240 10*3/uL (ref 145–400)
RBC: 4.31 10*6/uL (ref 3.70–5.45)
RDW: 12.5 % (ref 11.2–14.5)
WBC: 6.3 10*3/uL (ref 3.9–10.3)
lymph#: 1.8 10*3/uL (ref 0.9–3.3)

## 2015-02-14 LAB — COMPREHENSIVE METABOLIC PANEL (CC13)
ALK PHOS: 45 U/L (ref 40–150)
ALT: 21 U/L (ref 0–55)
AST: 19 U/L (ref 5–34)
Albumin: 3.9 g/dL (ref 3.5–5.0)
Anion Gap: 7 mEq/L (ref 3–11)
BUN: 14 mg/dL (ref 7.0–26.0)
CO2: 26 mEq/L (ref 22–29)
Calcium: 9.7 mg/dL (ref 8.4–10.4)
Chloride: 106 mEq/L (ref 98–109)
Creatinine: 0.9 mg/dL (ref 0.6–1.1)
EGFR: 70 mL/min/{1.73_m2} — AB (ref >90)
Glucose: 92 mg/dl (ref 70–140)
POTASSIUM: 4.4 meq/L (ref 3.5–5.1)
SODIUM: 140 meq/L (ref 136–145)
Total Bilirubin: <0.3 mg/dL (ref 0.20–1.20)
Total Protein: 6.8 g/dL (ref 6.4–8.3)

## 2015-02-21 ENCOUNTER — Ambulatory Visit (HOSPITAL_BASED_OUTPATIENT_CLINIC_OR_DEPARTMENT_OTHER): Payer: BLUE CROSS/BLUE SHIELD | Admitting: Oncology

## 2015-02-21 VITALS — BP 118/59 | HR 80 | Temp 98.2°F | Resp 17 | Ht 63.5 in | Wt 142.6 lb

## 2015-02-21 DIAGNOSIS — Z1501 Genetic susceptibility to malignant neoplasm of breast: Secondary | ICD-10-CM

## 2015-02-21 DIAGNOSIS — C50412 Malignant neoplasm of upper-outer quadrant of left female breast: Secondary | ICD-10-CM | POA: Diagnosis not present

## 2015-02-21 DIAGNOSIS — Z1509 Genetic susceptibility to other malignant neoplasm: Secondary | ICD-10-CM

## 2015-02-21 MED ORDER — ESTRADIOL 2 MG VA RING: 2.0000 mg | VAGINAL_RING | VAGINAL | Status: DC

## 2015-02-21 NOTE — Progress Notes (Signed)
ID: [name]   DOB: 01/14/1956  MR#: [id number]  ZYY#:[id number]  PCP: Erica Peaches, MD GYN: [name] SU: Erica Hausen MD OTHER MD: [name], [name], [facility] [name]  CHIEF COMPLAINT: Estrogen receptor positive breast cancer in BRCA-positive patient  CURRENT TREATMENT: Tamoxifen   HISTORY OF PRESENT ILLNESS: From the original intake note:   [name] had an unremarkable mammogram at [facility] on December 16, 2006.  Her breasts are dense, and of course that decreases the sensitivity.  In any case the screening mammogram was read as unremarkable.  In December, however, [name] had her annual examination under [name], and Dr. Irven Mullins palpated a lump in the left breast at the 3 o'clock position.  The patient was referred back for ultrasound, and this showed a hypoechoic mass measuring 1.4 cm corresponding to the palpable abnormality.  Going back to the September mammogram, Dr. Isaiah Mullins was able to tease out what possibly could have been a mammographic finding, but really it was so equivocal, that even in retrospect, the mammogram was not informative.   This was followed up with breast specific gamma imaging on March 11, 2007, and this found a solitary lesion, measuring up to 1.7 cm.  In addition, there was a 1.0 cm low intensity uptake area in the axilla, considered an indeterminate finding.    With this information, the patient proceeded to biopsy under ultrasound guidance of the mass.  This was performed on December 9, and it showed an invasive ductal carcinoma, which was ER positive at 100%, PR positive at 56%, with a borderline/low proliferation marker at 17%, and Hercept test negative at 1+.  [name] was then referred to [name] for further evaluation, and direction for definitive surgery, and she underwent left lumpectomy and sentinel lymph node biopsy March 13, 2007 for what proved to be ([id number]) a 2.1 cm invasive ductal carcinoma with  some micro-papillary features, grade 2, with evidence of lymphovascular invasion, but zero of two sentinel lymph nodes involved.  Margins were adequate for both the invasive and in situ components.   Her subsequent history is as detailed below.  INTERVAL HISTORY: [name] returns today for followup of her estrogen receptor positive breast cancer. She continues on tamoxifen, with good tolerance. In particular hot flashes and vaginal wetness or discharge are not major issues. She is also on Vagifem suppositories which she says are "not very effective." She continues on alendronate weekly, with good tolerance, although she says she sometimes "forgets".  REVIEW OF SYSTEMS: [name] still has pain in her repaired hip and [name] is trying some "shots" to see if he can avoid having to remove the screws, which she feels may be causing bursitis. This does not keep [name] from walking for exercise but it does keep her from playing tennis, which she would enjoy. Aside from these issues a detailed review of systems today was noncontributory  PAST MEDICAL HISTORY: Past Medical History  Diagnosis Date  . Goiter   . Osteoporosis   . Dyslipidemia   . Anxiety disorder   . BRCA2 positive 07/14/2012    PAST SURGICAL HISTORY: Past Surgical History  Procedure Laterality Date  . Breast lumpectomy  2008  . Abdominal hysterectomy  2009    with BSO    FAMILY HISTORY (updated OCT 2013) Family History  Problem Relation Age of Onset  . Cancer Mother   The patient's father is currently 39, with a remote history of colon cancer.  The patient's mother died at  the age of 15.  She had a history of breast cancer and the patient's mother's mother died from cancer, but the actual original site of cancer is unclear.  The patient has several stepbrothers and sisters, but only one half-sister, currently 42,with no history of cancer.   GYNECOLOGIC HISTORY: She is GX, P2.  First pregnancy age 29.  Last menstrual  period age 14.  She did not take hormone replacement.    SOCIAL HISTORY: (updated OCT 2013) Of course she is [name]'s wife. He has 4 children from an earlier marriage. She has two from hers, a son who works at [name] in [name], and a  son who lives in [place]. They have 3 grandchildren.   ADVANCED DIRECTIVES: in place  HEALTH MAINTENANCE: Social History  Substance Use Topics  . Smoking status: Former Smoker    Types: Cigarettes  . Smokeless tobacco: Never Used  . Alcohol Use: Yes     Comment: GLASS OF WINE MOST DAYS     Colonoscopy: August 2012  PAP: April 2014  Bone density: 2013 AT [facility]/ stable osteoporosis  Lipid panel:  Allergies  Allergen Reactions  . Ampicillin   . Latex     Current Outpatient Prescriptions  Medication Sig Dispense Refill  . alendronate (FOSAMAX) 70 MG tablet     . atorvastatin (LIPITOR) 10 MG tablet Take 1 tablet (10 mg total) by mouth daily.    . calcium gluconate 500 MG tablet Take 1 tablet (500 mg total) by mouth 3 (three) times daily.    . Cholecalciferol (VITAMIN D3) 2000 UNITS TABS Take by mouth.    . clorazepate (TRANXENE) 7.5 MG tablet     . estradiol (ESTRING) 2 MG vaginal ring Place 2 mg vaginally every 3 (three) months. follow package directions 1 each 12  . levothyroxine (SYNTHROID, LEVOTHROID) 125 MCG tablet Take 125 mcg by mouth daily.      . tamoxifen (NOLVADEX) 20 MG tablet TAKE ONE TABLET BY MOUTH DAILY 90 tablet 4  . temazepam (RESTORIL) 7.5 MG capsule Take 7.5 mg by mouth at bedtime as needed.      . traZODone (DESYREL) 50 MG tablet Take 1 tablet (50 mg total) by mouth at bedtime.    . tretinoin (RETIN-A) 0.025 % cream      No current facility-administered medications for this visit.    OBJECTIVE: Middle-aged white woman in no acute distress Filed Vitals:   02/21/15 1458  BP: 118/59  Pulse: 80  Temp: 98.2 F (36.8 C)  Resp: 17     Body mass index is 24.86 kg/(m^2).    ECOG FS: 0 Filed Weights   02/21/15 1458   Weight: 142 lb 9.6 oz (64.683 kg)   Sclerae unicteric, pupils round and equal Oropharynx clear and moist-- dentition in excellent repair No cervical or supraclavicular adenopathy Lungs no rales or rhonchi Heart regular rate and rhythm Abd soft, nontender, positive bowel sounds MSK no focal spinal tenderness, no upper extremity lymphedema Neuro: nonfocal, well oriented, appropriate affect Breasts: The right breast is unremarkable. The left breast is status post remote lumpectomy. There is no evidence of local recurrence. The left axilla is benign.  LAB RESULTS: Lab Results  Component Value Date   WBC 6.3 02/14/2015   NEUTROABS 4.0 02/14/2015   HGB 12.5 02/14/2015   HCT 37.8 02/14/2015   MCV 87.7 02/14/2015   PLT 240 02/14/2015      Chemistry      Component Value Date/Time   NA  140 02/14/2015 1445   NA 140 01/22/2011 1408   K 4.4 02/14/2015 1445   K 4.2 01/22/2011 1408   CL 106 07/16/2012 1201   CL 104 01/22/2011 1408   CO2 26 02/14/2015 1445   CO2 26 01/22/2011 1408   BUN 14.0 02/14/2015 1445   BUN 15 01/22/2011 1408   CREATININE 0.9 02/14/2015 1445   CREATININE 0.84 01/22/2011 1408      Component Value Date/Time   CALCIUM 9.7 02/14/2015 1445   CALCIUM 10.0 01/22/2011 1408   ALKPHOS 45 02/14/2015 1445   ALKPHOS 60 01/22/2011 1408   AST 19 02/14/2015 1445   AST 24 01/22/2011 1408   ALT 21 02/14/2015 1445   ALT 22 01/22/2011 1408   BILITOT <0.30 02/14/2015 1445   BILITOT 0.4 01/22/2011 1408       Lab Results  Component Value Date   LABCA2 20 01/09/2012      STUDIES: Mammography at [facility] 02/14/2015 shows the breast density to be category C. Exam was benign.   ASSESSMENT: 59 y.o. BRCA2 positive [place] woman   (1)  status post left lumpectomy and sentinel lymph node biopsy December 2008 for a T2 N0, grade 2, invasive ductal carcinoma, strongly estrogen and progesterone receptor positive, HER2 negative with an MIB1 of 17%, in the setting of a  deleterious BRCA2 mutation; with an Oncotype DX recurrence score of 20 predicting a 10-year risk of recurrence of 13% after 5 years on tamoxifen.    (2)  had taken tamoxifen for 5 years remotely for breast cancer prevention,   (3)  She started Arimidex April 2009. Therefore, her final risk of recurrence should be less than 13%.    (4) switched to tamoxifen January 2015  (5) She underwent a TAH/BSO with benign pathology in July 2009  PLAN:  Mindie is now 8 years out from definitive surgery for her estrogen receptor positive breast cancer. This is very favorable.  She continues on tamoxifen, the plan being for 10 years of antiestrogen switch means tamoxifen will continue through April 2019.  She has been using the Vagifem suppositories with fair results. She might do better with Estring. We discussed that today and she is willing to give it a try. I went ahead and wrote the prescription for her.  We reviewed the fact that tamoxifen does not "take away a woman's estrogen" and that if she does have sort some estrogen transvaginally tamoxifen will make sure no breast cell can "eat" that estrogen. This is why Vagifem suppositories and Estring are safe in her situation  She takes the Fosamax a bit intermittently. We discussed switching to Denosumab/prolia. However she is due for a repeat bone density May of next year. Perhaps she will not need ongoing treatment so we are going to wait until we get those results to make a decision regarding that. Meanwhile she continues on the Fosamax.  I have set her up for an MRI of the breasts this year. She will see me again in one year, after her mammogram. She knows to call for any problems that may develop before then.  MAGRINAT,GUSTAV C    02/21/2015

## 2015-02-25 ENCOUNTER — Other Ambulatory Visit: Payer: Self-pay | Admitting: *Deleted

## 2015-03-01 ENCOUNTER — Other Ambulatory Visit: Payer: Self-pay

## 2015-03-01 ENCOUNTER — Telehealth: Payer: Self-pay

## 2015-03-01 DIAGNOSIS — Z853 Personal history of malignant neoplasm of breast: Secondary | ICD-10-CM

## 2015-03-01 NOTE — Telephone Encounter (Signed)
[facility] called for vagifem refill. Last filled 05/05/12. Dr Jana Hakim mentioned reordering it on 01/30/15 and then in the note it is marked in red "discontinued". I was not sure if this was a computer issue.

## 2015-03-03 NOTE — Progress Notes (Signed)
Patient verbally notified of 02/14/15  mammo results which came back with no evidence of cancer.  Patient stated understanding.

## 2015-03-04 ENCOUNTER — Telehealth: Payer: Self-pay

## 2015-03-04 DIAGNOSIS — Z1501 Genetic susceptibility to malignant neoplasm of breast: Secondary | ICD-10-CM

## 2015-03-04 DIAGNOSIS — C50412 Malignant neoplasm of upper-outer quadrant of left female breast: Secondary | ICD-10-CM

## 2015-03-04 DIAGNOSIS — Z1509 Genetic susceptibility to other malignant neoplasm: Secondary | ICD-10-CM

## 2015-03-04 MED ORDER — ESTRADIOL 10 MCG VA TABS: 1.0000 | ORAL_TABLET | VAGINAL | Status: DC

## 2015-03-04 NOTE — Telephone Encounter (Signed)
Another call from [place] for vagifem refill. S/w Dr Jana Hakim and vo to refill received.

## 2015-03-06 NOTE — Telephone Encounter (Signed)
Please refill as requested.

## 2015-03-07 ENCOUNTER — Other Ambulatory Visit: Payer: Self-pay

## 2015-03-07 NOTE — Telephone Encounter (Signed)
Called patient and she does not a refill on vagifem- she states that she no longer uses the medication.

## 2015-03-08 ENCOUNTER — Telehealth: Payer: Self-pay | Admitting: *Deleted

## 2015-03-08 NOTE — Telephone Encounter (Signed)
"  I received Vagi fem estrogen suppository from [facility] yesterday.  I have not had this in a long time.  Estring is what I thought I am to use.  They put both in my bag.  I've not used either yet.  I was seen a few weeks ago.  The nurse was to call Kristopher Oppenheim to cancel the Vagi fem was my understanding."   Will notify Dr. Jana Hakim and staff.  This nurse see's 03-04-2015 V.O. To refill Vagi fem.

## 2015-03-08 NOTE — Telephone Encounter (Signed)
Writer spoke with patient the last week regarding the vagi fem at which time the patient did not this medication.  Writer rejected the request so not sure why they filled it.  LVM with patient encouraging her to return it.

## 2015-03-14 ENCOUNTER — Ambulatory Visit
Admission: RE | Admit: 2015-03-14 | Discharge: 2015-03-14 | Disposition: A | Discharge status: Home / Self Care | Payer: BLUE CROSS/BLUE SHIELD | Source: Ambulatory Visit | Attending: Oncology | Admitting: Oncology

## 2015-03-14 DIAGNOSIS — Z1509 Genetic susceptibility to other malignant neoplasm: Secondary | ICD-10-CM

## 2015-03-14 DIAGNOSIS — Z1501 Genetic susceptibility to malignant neoplasm of breast: Secondary | ICD-10-CM

## 2015-03-14 DIAGNOSIS — C50412 Malignant neoplasm of upper-outer quadrant of left female breast: Secondary | ICD-10-CM

## 2015-03-14 MED ORDER — GADOBENATE DIMEGLUMINE 529 MG/ML IV SOLN
13.0000 mL | Freq: Once | INTRAVENOUS | Status: AC | PRN
  Administered 2015-03-14: 13 mL via INTRAVENOUS

## 2015-03-16 ENCOUNTER — Encounter: Payer: Self-pay | Admitting: Oncology

## 2015-04-28 ENCOUNTER — Other Ambulatory Visit: Payer: Self-pay | Admitting: Oncology

## 2015-06-22 ENCOUNTER — Ambulatory Visit: Payer: Self-pay | Admitting: Orthopedic Surgery

## 2015-06-22 NOTE — Progress Notes (Signed)
Preoperative surgical orders have been place into the Epic hospital system for [name] on 06/22/2015, 10:56 AM  by Mickel Crow for surgery on 07-08-2015.  Preop Hip orders including IV Tylenol, and IV Decadron as long as there are no contraindications to the above medications. Arlee Muslim, PA-C

## 2015-07-05 ENCOUNTER — Encounter (HOSPITAL_COMMUNITY): Payer: Self-pay

## 2015-07-05 ENCOUNTER — Encounter (HOSPITAL_COMMUNITY)
Admission: RE | Admit: 2015-07-05 | Discharge: 2015-07-05 | Disposition: A | Discharge status: Home / Self Care | Payer: BLUE CROSS/BLUE SHIELD | Source: Ambulatory Visit | Attending: Orthopedic Surgery | Admitting: Orthopedic Surgery

## 2015-07-05 DIAGNOSIS — Z791 Long term (current) use of non-steroidal anti-inflammatories (NSAID): Secondary | ICD-10-CM | POA: Diagnosis not present

## 2015-07-05 DIAGNOSIS — E785 Hyperlipidemia, unspecified: Secondary | ICD-10-CM | POA: Diagnosis not present

## 2015-07-05 DIAGNOSIS — E039 Hypothyroidism, unspecified: Secondary | ICD-10-CM | POA: Diagnosis not present

## 2015-07-05 DIAGNOSIS — Z79899 Other long term (current) drug therapy: Secondary | ICD-10-CM | POA: Diagnosis not present

## 2015-07-05 DIAGNOSIS — Z923 Personal history of irradiation: Secondary | ICD-10-CM | POA: Diagnosis not present

## 2015-07-05 DIAGNOSIS — Y838 Other surgical procedures as the cause of abnormal reaction of the patient, or of later complication, without mention of misadventure at the time of the procedure: Secondary | ICD-10-CM | POA: Diagnosis not present

## 2015-07-05 DIAGNOSIS — Z853 Personal history of malignant neoplasm of breast: Secondary | ICD-10-CM | POA: Diagnosis not present

## 2015-07-05 DIAGNOSIS — E7800 Pure hypercholesterolemia, unspecified: Secondary | ICD-10-CM | POA: Diagnosis not present

## 2015-07-05 DIAGNOSIS — F419 Anxiety disorder, unspecified: Secondary | ICD-10-CM | POA: Diagnosis not present

## 2015-07-05 DIAGNOSIS — T8484XA Pain due to internal orthopedic prosthetic devices, implants and grafts, initial encounter: Secondary | ICD-10-CM | POA: Diagnosis not present

## 2015-07-05 DIAGNOSIS — Z7983 Long term (current) use of bisphosphonates: Secondary | ICD-10-CM | POA: Diagnosis not present

## 2015-07-05 DIAGNOSIS — Z87891 Personal history of nicotine dependence: Secondary | ICD-10-CM | POA: Diagnosis not present

## 2015-07-05 DIAGNOSIS — Z79810 Long term (current) use of selective estrogen receptor modulators (SERMs): Secondary | ICD-10-CM | POA: Diagnosis not present

## 2015-07-05 DIAGNOSIS — D559 Anemia due to enzyme disorder, unspecified: Secondary | ICD-10-CM | POA: Diagnosis not present

## 2015-07-05 DIAGNOSIS — M25552 Pain in left hip: Secondary | ICD-10-CM | POA: Diagnosis not present

## 2015-07-05 HISTORY — DX: Personal history of irradiation: Z92.3

## 2015-07-05 HISTORY — DX: Unspecified fall, initial encounter: W19.XXXA

## 2015-07-05 HISTORY — DX: Malignant (primary) neoplasm, unspecified: C80.1

## 2015-07-05 HISTORY — DX: Anxiety disorder, unspecified: F41.9

## 2015-07-05 LAB — CBC
HEMATOCRIT: 37.9 % (ref 36.0–46.0)
HEMOGLOBIN: 12.7 g/dL (ref 12.0–15.0)
MCH: 28.9 pg (ref 26.0–34.0)
MCHC: 33.5 g/dL (ref 30.0–36.0)
MCV: 86.3 fL (ref 78.0–100.0)
Platelets: 224 10*3/uL (ref 150–400)
RBC: 4.39 MIL/uL (ref 3.87–5.11)
RDW: 12.3 % (ref 11.5–15.5)
WBC: 6.2 10*3/uL (ref 4.0–10.5)

## 2015-07-05 LAB — BASIC METABOLIC PANEL
ANION GAP: 8 (ref 5–15)
BUN: 12 mg/dL (ref 6–20)
CALCIUM: 9.4 mg/dL (ref 8.9–10.3)
CO2: 27 mmol/L (ref 22–32)
Chloride: 106 mmol/L (ref 101–111)
Creatinine, Ser: 0.91 mg/dL (ref 0.44–1.00)
GFR calc non Af Amer: >60 mL/min (ref >60)
GLUCOSE: 108 mg/dL — AB (ref 65–99)
POTASSIUM: 4.5 mmol/L (ref 3.5–5.1)
Sodium: 141 mmol/L (ref 135–145)

## 2015-07-05 LAB — SURGICAL PCR SCREEN
MRSA, PCR: NEGATIVE
STAPHYLOCOCCUS AUREUS: NEGATIVE

## 2015-07-05 NOTE — Patient Instructions (Addendum)
[name]  07/05/2015   Your procedure is scheduled on: 07/08/15  Report to [facility] Main  Entrance take [place]  elevators to 3rd floor to  [place] at 1:15 PM.  Call this number if you have problems the morning of surgery [phone]   Remember: ONLY 1 PERSON MAY GO WITH YOU TO SHORT STAY TO GET  READY MORNING OF [place].  Do not eat food :After Midnight.  Clear liquids ok until 9:15 am on day of surgery   Take these medicines the morning of surgery with A SIP OF WATER: Wellbutrin, Tranxene                                You may not have any metal on your body including hair pins and              piercings  Do not wear jewelry, make-up, lotions, powders or perfumes, deodorant             Do not wear nail polish.  Do not shave  48 hours prior to surgery.                 Do not bring valuables to the hospital. [place].  Contacts, dentures or bridgework may not be worn to surgery.             Leave suitcase in car-someone can bring it in after your surgery.     Special Instructions: N/A              Please read over the following fact sheets you were given:Incentive spirometer _____________________________________________________________________  [facility] - Preparing for Surgery Before surgery, you can play an important role.  Because skin is not sterile, your skin needs to be as free of germs as possible.  You can reduce the number of germs on your skin by washing with CHG (chlorahexidine gluconate) soap before surgery.  CHG is an antiseptic cleaner which kills germs and bonds with the skin to continue killing germs even after washing. Please DO NOT use if you have an allergy to CHG or antibacterial soaps.  If your skin becomes reddened/irritated stop using the CHG and inform your nurse when you arrive at Short Stay. Do not shave (including legs and underarms) for at least 48 hours prior  to the first CHG shower.  You may shave your face/neck. Please follow these instructions carefully:  1.  Shower with CHG Soap the night before surgery and the  morning of Surgery.  2.  If you choose to wash your hair, wash your hair first as usual with your  normal  shampoo.  3.  After you shampoo, rinse your hair and body thoroughly to remove the  shampoo.                           4.  Use CHG as you would any other liquid soap.  You can apply chg directly  to the skin and wash                       Gently with a scrungie or clean washcloth.  5.  Apply  the CHG Soap to your body ONLY FROM THE NECK DOWN.   Do not use on face/ open                           Wound or open sores. Avoid contact with eyes, ears mouth and genitals (private parts).                       Wash face,  Genitals (private parts) with your normal soap.             6.  Wash thoroughly, paying special attention to the area where your surgery  will be performed.  7.  Thoroughly rinse your body with warm water from the neck down.  8.  DO NOT shower/wash with your normal soap after using and rinsing off  the CHG Soap.                9.  Pat yourself dry with a clean towel.            10.  Wear clean pajamas.            11.  Place clean sheets on your bed the night of your first shower and do not  sleep with pets. Day of Surgery : Do not apply any lotions/deodorants the morning of surgery.  Please wear clean clothes to the hospital/surgery center.  FAILURE TO FOLLOW THESE INSTRUCTIONS MAY RESULT IN THE CANCELLATION OF YOUR SURGERY PATIENT SIGNATURE_________________________________  NURSE SIGNATURE__________________________________  ________________________________________________________________________ ________________________________________________________________________    CLEAR LIQUID DIET   Foods Allowed                                                                     Foods Excluded  Coffee and tea, regular and  decaf                             liquids that you cannot  Plain Jell-O in any flavor                                             see through such as: Fruit ices (not with fruit pulp)                                     milk, soups, orange juice  Iced Popsicles                                    All solid food Carbonated beverages, regular and diet                                    Cranberry, grape and apple juices Sports drinks like Gatorade Lightly seasoned clear broth or consume(fat free) Sugar, honey syrup  Sample Menu Breakfast  Lunch                                     Supper Cranberry juice                    Beef broth                            Chicken broth Jell-O                                     Grape juice                           Apple juice Coffee or tea                        Jell-O                                      Popsicle                                                Coffee or tea                        Coffee or tea  _____________________________________________________________________    Incentive Spirometer  An incentive spirometer is a tool that can help keep your lungs clear and active. This tool measures how well you are filling your lungs with each breath. Taking long deep breaths may help reverse or decrease the chance of developing breathing (pulmonary) problems (especially infection) following:  A long period of time when you are unable to move or be active. BEFORE THE PROCEDURE   If the spirometer includes an indicator to show your best effort, your nurse or respiratory therapist will set it to a desired goal.  If possible, sit up straight or lean slightly forward. Try not to slouch.  Hold the incentive spirometer in an upright position. INSTRUCTIONS FOR USE  1. Sit on the edge of your bed if possible, or sit up as far as you can in bed or on a chair. 2. Hold the incentive spirometer in an upright  position. 3. Breathe out normally. 4. Place the mouthpiece in your mouth and seal your lips tightly around it. 5. Breathe in slowly and as deeply as possible, raising the piston or the ball toward the top of the column. 6. Hold your breath for 3-5 seconds or for as long as possible. Allow the piston or ball to fall to the bottom of the column. 7. Remove the mouthpiece from your mouth and breathe out normally. 8. Rest for a few seconds and repeat Steps 1 through 7 at least 10 times every 1-2 hours when you are awake. Take your time and take a few normal breaths between deep breaths. 9. The spirometer may include an indicator to show your best effort. Use the indicator as a goal to work toward during each repetition. 10. After each set of 10 deep breaths,  practice coughing to be sure your lungs are clear. If you have an incision (the cut made at the time of surgery), support your incision when coughing by placing a pillow or rolled up towels firmly against it. Once you are able to get out of bed, walk around indoors and cough well. You may stop using the incentive spirometer when instructed by your caregiver.  RISKS AND COMPLICATIONS  Take your time so you do not get dizzy or light-headed.  If you are in pain, you may need to take or ask for pain medication before doing incentive spirometry. It is harder to take a deep breath if you are having pain. AFTER USE  Rest and breathe slowly and easily.  It can be helpful to keep track of a log of your progress. Your caregiver can provide you with a simple table to help with this. If you are using the spirometer at home, follow these instructions: [place] IF:   You are having difficultly using the spirometer.  You have trouble using the spirometer as often as instructed.  Your pain medication is not giving enough relief while using the spirometer.  You develop fever of 100.5 F (38.1 C) or higher. SEEK IMMEDIATE MEDICAL CARE IF:    You cough up bloody sputum that had not been present before.  You develop fever of 102 F (38.9 C) or greater.  You develop worsening pain at or near the incision site. MAKE SURE YOU:   Understand these instructions.  Will watch your condition.  Will get help right away if you are not doing well or get worse. Document Released: 07/30/2006 Document Revised: 06/11/2011 Document Reviewed: 09/30/2006 [facility] Patient Information 2014 [place], [place].   ________________________________________________________________________

## 2015-07-08 ENCOUNTER — Ambulatory Visit (HOSPITAL_COMMUNITY): Payer: BLUE CROSS/BLUE SHIELD

## 2015-07-08 ENCOUNTER — Ambulatory Visit (HOSPITAL_COMMUNITY): Payer: BLUE CROSS/BLUE SHIELD | Admitting: Anesthesiology

## 2015-07-08 ENCOUNTER — Encounter (HOSPITAL_COMMUNITY)
Admission: RE | Disposition: A | Discharge status: Home / Self Care | Payer: Self-pay | Source: Ambulatory Visit | Attending: Orthopedic Surgery

## 2015-07-08 ENCOUNTER — Ambulatory Visit (HOSPITAL_COMMUNITY)
Admission: RE | Admit: 2015-07-08 | Discharge: 2015-07-08 | Disposition: A | Discharge status: Home / Self Care | Payer: BLUE CROSS/BLUE SHIELD | Source: Ambulatory Visit | Attending: Orthopedic Surgery | Admitting: Orthopedic Surgery

## 2015-07-08 ENCOUNTER — Encounter (HOSPITAL_COMMUNITY): Payer: Self-pay | Admitting: *Deleted

## 2015-07-08 DIAGNOSIS — Y838 Other surgical procedures as the cause of abnormal reaction of the patient, or of later complication, without mention of misadventure at the time of the procedure: Secondary | ICD-10-CM | POA: Diagnosis not present

## 2015-07-08 DIAGNOSIS — Z791 Long term (current) use of non-steroidal anti-inflammatories (NSAID): Secondary | ICD-10-CM | POA: Diagnosis not present

## 2015-07-08 DIAGNOSIS — E785 Hyperlipidemia, unspecified: Secondary | ICD-10-CM | POA: Diagnosis not present

## 2015-07-08 DIAGNOSIS — Z419 Encounter for procedure for purposes other than remedying health state, unspecified: Secondary | ICD-10-CM

## 2015-07-08 DIAGNOSIS — F419 Anxiety disorder, unspecified: Secondary | ICD-10-CM | POA: Diagnosis not present

## 2015-07-08 DIAGNOSIS — Z79810 Long term (current) use of selective estrogen receptor modulators (SERMs): Secondary | ICD-10-CM | POA: Insufficient documentation

## 2015-07-08 DIAGNOSIS — Z7983 Long term (current) use of bisphosphonates: Secondary | ICD-10-CM | POA: Insufficient documentation

## 2015-07-08 DIAGNOSIS — Z87891 Personal history of nicotine dependence: Secondary | ICD-10-CM | POA: Diagnosis not present

## 2015-07-08 DIAGNOSIS — M25552 Pain in left hip: Secondary | ICD-10-CM | POA: Diagnosis not present

## 2015-07-08 DIAGNOSIS — Z923 Personal history of irradiation: Secondary | ICD-10-CM | POA: Insufficient documentation

## 2015-07-08 DIAGNOSIS — Z853 Personal history of malignant neoplasm of breast: Secondary | ICD-10-CM | POA: Diagnosis not present

## 2015-07-08 DIAGNOSIS — Z4789 Encounter for other orthopedic aftercare: Secondary | ICD-10-CM | POA: Diagnosis not present

## 2015-07-08 DIAGNOSIS — T8484XA Pain due to internal orthopedic prosthetic devices, implants and grafts, initial encounter: Secondary | ICD-10-CM | POA: Diagnosis not present

## 2015-07-08 DIAGNOSIS — Z79899 Other long term (current) drug therapy: Secondary | ICD-10-CM | POA: Diagnosis not present

## 2015-07-08 DIAGNOSIS — Z472 Encounter for removal of internal fixation device: Secondary | ICD-10-CM | POA: Diagnosis not present

## 2015-07-08 HISTORY — PX: HARDWARE REMOVAL: SHX979

## 2015-07-08 SURGERY — REMOVAL, HARDWARE
Anesthesia: General | Site: Hip | Laterality: Left

## 2015-07-08 MED ORDER — PROPOFOL 10 MG/ML IV BOLUS
INTRAVENOUS | Status: AC
Start: 2015-07-08 — End: 2015-07-08
  Filled 2015-07-08: qty 20

## 2015-07-08 MED ORDER — MIDAZOLAM HCL 5 MG/5ML IJ SOLN
INTRAMUSCULAR | Status: DC | PRN
  Administered 2015-07-08: 2 mg via INTRAVENOUS

## 2015-07-08 MED ORDER — LIDOCAINE HCL (CARDIAC) 20 MG/ML IV SOLN
INTRAVENOUS | Status: AC
  Filled 2015-07-08: qty 5

## 2015-07-08 MED ORDER — DEXAMETHASONE SODIUM PHOSPHATE 10 MG/ML IJ SOLN
INTRAMUSCULAR | Status: DC | PRN
  Administered 2015-07-08: 10 mg via INTRAVENOUS

## 2015-07-08 MED ORDER — VANCOMYCIN HCL IN DEXTROSE 1-5 GM/200ML-% IV SOLN
1000.0000 mg | INTRAVENOUS | Status: AC
  Administered 2015-07-08: 1000 mg via INTRAVENOUS
  Filled 2015-07-08: qty 200

## 2015-07-08 MED ORDER — ONDANSETRON HCL 4 MG/2ML IJ SOLN
INTRAMUSCULAR | Status: AC
  Filled 2015-07-08: qty 2

## 2015-07-08 MED ORDER — FENTANYL CITRATE (PF) 250 MCG/5ML IJ SOLN
INTRAMUSCULAR | Status: DC | PRN
  Administered 2015-07-08 (×2): 50 ug via INTRAVENOUS
  Administered 2015-07-08: 100 ug via INTRAVENOUS

## 2015-07-08 MED ORDER — TAMOXIFEN CITRATE 20 MG PO TABS: ORAL_TABLET | ORAL | Status: DC

## 2015-07-08 MED ORDER — DEXAMETHASONE SODIUM PHOSPHATE 10 MG/ML IJ SOLN: 10.0000 mg | Freq: Once | INTRAMUSCULAR | Status: DC

## 2015-07-08 MED ORDER — BUPIVACAINE HCL (PF) 0.25 % IJ SOLN
INTRAMUSCULAR | Status: DC | PRN
  Administered 2015-07-08: 30 mL

## 2015-07-08 MED ORDER — SODIUM CHLORIDE 0.9 % IV SOLN
INTRAVENOUS | Status: DC
  Administered 2015-07-08: 15:00:00 via INTRAVENOUS

## 2015-07-08 MED ORDER — POVIDONE-IODINE 10 % EX SWAB: 2.0000 "application " | Freq: Once | CUTANEOUS | Status: DC

## 2015-07-08 MED ORDER — MIDAZOLAM HCL 2 MG/2ML IJ SOLN
INTRAMUSCULAR | Status: AC
  Filled 2015-07-08: qty 2

## 2015-07-08 MED ORDER — FENTANYL CITRATE (PF) 100 MCG/2ML IJ SOLN
INTRAMUSCULAR | Status: AC
  Filled 2015-07-08: qty 2

## 2015-07-08 MED ORDER — ONDANSETRON HCL 4 MG/2ML IJ SOLN: 4.0000 mg | Freq: Once | INTRAMUSCULAR | Status: DC | PRN

## 2015-07-08 MED ORDER — ONDANSETRON HCL 4 MG/2ML IJ SOLN
INTRAMUSCULAR | Status: DC | PRN
  Administered 2015-07-08: 4 mg via INTRAVENOUS

## 2015-07-08 MED ORDER — 0.9 % SODIUM CHLORIDE (POUR BTL) OPTIME
TOPICAL | Status: DC | PRN
  Administered 2015-07-08: 1000 mL

## 2015-07-08 MED ORDER — LIDOCAINE HCL (CARDIAC) 20 MG/ML IV SOLN
INTRAVENOUS | Status: DC | PRN
  Administered 2015-07-08: 80 mg via INTRATRACHEAL

## 2015-07-08 MED ORDER — DEXAMETHASONE SODIUM PHOSPHATE 10 MG/ML IJ SOLN
INTRAMUSCULAR | Status: AC
  Filled 2015-07-08: qty 1

## 2015-07-08 MED ORDER — SUCCINYLCHOLINE CHLORIDE 20 MG/ML IJ SOLN
INTRAMUSCULAR | Status: DC | PRN
  Administered 2015-07-08: 100 mg via INTRAVENOUS

## 2015-07-08 MED ORDER — ACETAMINOPHEN 10 MG/ML IV SOLN
INTRAVENOUS | Status: AC
  Filled 2015-07-08: qty 100

## 2015-07-08 MED ORDER — FENTANYL CITRATE (PF) 100 MCG/2ML IJ SOLN
INTRAMUSCULAR | Status: AC
Start: 2015-07-08 — End: 2015-07-08
  Filled 2015-07-08: qty 2

## 2015-07-08 MED ORDER — LACTATED RINGERS IV SOLN
INTRAVENOUS | Status: DC | PRN
  Administered 2015-07-08: 14:00:00 via INTRAVENOUS

## 2015-07-08 MED ORDER — CHLORHEXIDINE GLUCONATE 4 % EX LIQD: 60.0000 mL | Freq: Once | CUTANEOUS | Status: DC

## 2015-07-08 MED ORDER — METHOCARBAMOL 500 MG PO TABS: 500.0000 mg | ORAL_TABLET | Freq: Four times a day (QID) | ORAL | Status: DC

## 2015-07-08 MED ORDER — HYDROCODONE-ACETAMINOPHEN 5-325 MG PO TABS: 1.0000 | ORAL_TABLET | Freq: Four times a day (QID) | ORAL | Status: DC | PRN

## 2015-07-08 MED ORDER — PROPOFOL 10 MG/ML IV BOLUS
INTRAVENOUS | Status: DC | PRN
  Administered 2015-07-08: 150 mg via INTRAVENOUS

## 2015-07-08 MED ORDER — ACETAMINOPHEN 10 MG/ML IV SOLN
1000.0000 mg | Freq: Once | INTRAVENOUS | Status: AC
  Administered 2015-07-08: 1000 mg via INTRAVENOUS
  Filled 2015-07-08: qty 100

## 2015-07-08 MED ORDER — HYDROCODONE-ACETAMINOPHEN 5-325 MG PO TABS
1.0000 | ORAL_TABLET | Freq: Once | ORAL | Status: AC
  Administered 2015-07-08: 1 via ORAL
  Filled 2015-07-08: qty 1

## 2015-07-08 MED ORDER — HYDROMORPHONE HCL 1 MG/ML IJ SOLN: 0.5000 mg | INTRAMUSCULAR | Status: DC | PRN

## 2015-07-08 SURGICAL SUPPLY — 33 items
DRAPE C-ARM 42X120 X-RAY (DRAPES) ×2 IMPLANT
DRAPE C-ARMOR (DRAPES) ×2 IMPLANT
DRAPE EXTREMITY T 121X128X90 (DRAPE) ×2 IMPLANT
DRAPE INCISE IOBAN 66X45 STRL (DRAPES) ×2 IMPLANT
DRAPE ORTHO SPLIT 77X108 STRL (DRAPES) ×2
DRAPE SURG ORHT 6 SPLT 77X108 (DRAPES) IMPLANT
DRAPE U-SHAPE 47X51 STRL (DRAPES) ×2 IMPLANT
DRSG ADAPTIC 3X8 NADH LF (GAUZE/BANDAGES/DRESSINGS) ×2 IMPLANT
DRSG MEPILEX BORDER 4X4 (GAUZE/BANDAGES/DRESSINGS) ×1 IMPLANT
DURAPREP 26ML APPLICATOR (WOUND CARE) ×2 IMPLANT
ELECT REM PT RETURN 9FT ADLT (ELECTROSURGICAL) ×2
ELECTRODE REM PT RTRN 9FT ADLT (ELECTROSURGICAL) ×1 IMPLANT
GLOVE BIOGEL PI IND STRL 7.5 (GLOVE) IMPLANT
GLOVE BIOGEL PI IND STRL 8 (GLOVE) ×3 IMPLANT
GLOVE BIOGEL PI INDICATOR 7.5 (GLOVE) ×2
GLOVE BIOGEL PI INDICATOR 8 (GLOVE) ×1
GLOVE SURG SS PI 7.5 STRL IVOR (GLOVE) ×1 IMPLANT
GLOVE SURG SS PI 8.0 STRL IVOR (GLOVE) ×1 IMPLANT
GOWN STRL REUS W/TWL LRG LVL3 (GOWN DISPOSABLE) ×2 IMPLANT
GOWN STRL REUS W/TWL XL LVL3 (GOWN DISPOSABLE) ×2 IMPLANT
KIT BASIN OR (CUSTOM PROCEDURE TRAY) ×2 IMPLANT
MANIFOLD NEPTUNE II (INSTRUMENTS) ×2 IMPLANT
PACK TOTAL JOINT (CUSTOM PROCEDURE TRAY) ×2 IMPLANT
PADDING CAST COTTON 6X4 STRL (CAST SUPPLIES) ×4 IMPLANT
POSITIONER SURGICAL ARM (MISCELLANEOUS) ×2 IMPLANT
STAPLER VISISTAT 35W (STAPLE) IMPLANT
STRIP CLOSURE SKIN 1/2X4 (GAUZE/BANDAGES/DRESSINGS) ×2 IMPLANT
SUT MNCRL AB 4-0 PS2 18 (SUTURE) ×2 IMPLANT
SUT VIC AB 1 CT1 36 (SUTURE) ×1 IMPLANT
SUT VIC AB 2-0 CT1 27 (SUTURE) ×8
SUT VIC AB 2-0 CT1 TAPERPNT 27 (SUTURE) ×2 IMPLANT
TOWEL OR 17X26 10 PK STRL BLUE (TOWEL DISPOSABLE) ×4 IMPLANT
YANKAUER SUCT BULB TIP 10FT TU (MISCELLANEOUS) ×1 IMPLANT

## 2015-07-08 NOTE — Anesthesia Preprocedure Evaluation (Addendum)
Anesthesia Evaluation  Patient identified by MRN, date of birth, ID band Patient awake    Reviewed: Allergy & Precautions, NPO status , Patient's Chart, lab work & pertinent test results  Airway Mallampati: I  TM Distance: >3 FB Neck ROM: Full    Dental   Pulmonary former smoker,    Pulmonary exam normal        Cardiovascular Normal cardiovascular exam     Neuro/Psych Anxiety    GI/Hepatic   Endo/Other    Renal/GU      Musculoskeletal   Abdominal   Peds  Hematology   Anesthesia Other Findings Hx Breast CA  Reproductive/Obstetrics                            Anesthesia Physical Anesthesia Plan  ASA: I  Anesthesia Plan: General   Post-op Pain Management:    Induction: Intravenous  Airway Management Planned: Oral ETT  Additional Equipment:   Intra-op Plan:   Post-operative Plan: Extubation in OR  Informed Consent: I have reviewed the patients History and Physical, chart, labs and discussed the procedure including the risks, benefits and alternatives for the proposed anesthesia with the patient or authorized representative who has indicated his/her understanding and acceptance.     Plan Discussed with: CRNA, Anesthesiologist and Surgeon  Anesthesia Plan Comments:         Anesthesia Quick Evaluation

## 2015-07-08 NOTE — Brief Op Note (Signed)
07/08/2015  4:34 PM  PATIENT:  Floria Raveling Priebe  60 y.o. female  PRE-OPERATIVE DIAGNOSIS:  PAINFUL HARDWARE LEFT HIP   POST-OPERATIVE DIAGNOSIS:  PAINFUL HARDWARE LEFT HIP  PROCEDURE:  Procedure(s): LEFT HIP HARDWARE REMOVAL (Left)  SURGEON:  Surgeon(s) and Role:    * [name], MD - Primary  PHYSICIAN ASSISTANT:   ASSISTANTS: none   ANESTHESIA:   general  EBL:  Total I/O In: 600 [I.V.:600] Out: 100 [Blood:100]  DRAINS: none   LOCAL MEDICATIONS USED:  MARCAINE     COUNTS:  YES  TOURNIQUET:  * No tourniquets in log *  DICTATION: .Other Dictation: Dictation Number [id number]  PLAN OF CARE: Discharge to home after PACU  PATIENT DISPOSITION:  PACU - hemodynamically stable.

## 2015-07-08 NOTE — Anesthesia Procedure Notes (Signed)
Procedure Name: Intubation Date/Time: 07/08/2015 3:48 PM Performed by: Dione Booze Pre-anesthesia Checklist: Patient being monitored, Suction available, Emergency Drugs available and Patient identified Patient Re-evaluated:Patient Re-evaluated prior to inductionOxygen Delivery Method: Circle system utilized Preoxygenation: Pre-oxygenation with 100% oxygen Intubation Type: IV induction Laryngoscope Size: Mac and 4 Grade View: Grade I Tube type: Oral Tube size: 7.5 mm Number of attempts: 1 Airway Equipment and Method: Stylet Placement Confirmation: ETT inserted through vocal cords under direct vision,  breath sounds checked- equal and bilateral and positive ETCO2 Secured at: 22 cm Tube secured with: Tape Dental Injury: Teeth and Oropharynx as per pre-operative assessment

## 2015-07-08 NOTE — Transfer of Care (Signed)
Immediate Anesthesia Transfer of Care Note  Patient: [name]  Procedure(s) Performed: Procedure(s): LEFT HIP HARDWARE REMOVAL (Left)  Patient Location: PACU  Anesthesia Type:General  Level of Consciousness: awake, alert , oriented and patient cooperative  Airway & Oxygen Therapy: Patient Spontanous Breathing and Patient connected to face mask oxygen  Post-op Assessment: Report given to RN and Post -op Vital signs reviewed and stable  Post vital signs: Reviewed and stable  Last Vitals:  Filed Vitals:   07/08/15 1332  BP: 122/49  Pulse: 79  Temp: 36.7 C  Resp: 16    Complications: No apparent anesthesia complications

## 2015-07-08 NOTE — Discharge Instructions (Signed)
You may remove your large bandage and shower in 2 days  Take an 81 mg ASA daily for the next 4 weeks starting tomorrow  You may bear full weight on your left leg but use a walker or cane for the next two weeks to help with balance  You can begin driving once you are off the hydrocodone

## 2015-07-08 NOTE — Interval H&P Note (Signed)
History and Physical Interval Note:  07/08/2015 2:57 PM  [name]  has presented today for surgery, with the diagnosis of PAINFUL HARDWARE LEFT HIP   The various methods of treatment have been discussed with the patient and family. After consideration of risks, benefits and other options for treatment, the patient has consented to  Procedure(s): LEFT HIP HARDWARE REMOVAL (Left) as a surgical intervention .  The patient's history has been reviewed, patient examined, no change in status, stable for surgery.  I have reviewed the patient's chart and labs.  Questions were answered to the patient's satisfaction.     Gearlean Alf

## 2015-07-08 NOTE — Anesthesia Postprocedure Evaluation (Signed)
Anesthesia Post Note  Patient: [name]  Procedure(s) Performed: Procedure(s) (LRB): LEFT HIP HARDWARE REMOVAL (Left)  Patient location during evaluation: PACU Anesthesia Type: General Level of consciousness: awake, oriented, patient cooperative and sedated Pain management: pain level controlled Vital Signs Assessment: post-procedure vital signs reviewed and stable Respiratory status: spontaneous breathing and respiratory function stable Cardiovascular status: blood pressure returned to baseline and stable Anesthetic complications: no    Last Vitals:  Filed Vitals:   07/08/15 1718 07/08/15 1729  BP: 129/65 122/58  Pulse: 68 65  Temp: 36.4 C 36.4 C  Resp: 15 15    Last Pain:  Filed Vitals:   07/08/15 1738  PainSc: 4                  Oryn Casanova EDWARD

## 2015-07-08 NOTE — H&P (Signed)
CC- [name] is a 60 y.o. female who presents with left hip pain  Hip Pain: Patient complains of left hip pain. Onset of the symptoms was several years ago. Inciting event: She had a fall approximately 2 years ago in [place] sustaining a left intertrochanteric femur fracture treated with intramedullary nailing. It healed uneventfully but she has persistent lateral thigh discomfort related to the hardware. She presents now for hardware removal.  Past Medical History  Diagnosis Date  . Goiter   . Osteoporosis   . Dyslipidemia   . Anxiety disorder   . BRCA2 positive 07/14/2012  . Anxiety   . Cancer [facility]) 2008    left breast  . History of radiation therapy   . Fall     Past Surgical History  Procedure Laterality Date  . Abdominal hysterectomy  2009    with BSO  . Breast lumpectomy  [id number], 1991  . Fracture surgery  2015    left hip    Prior to Admission medications   Medication Sig Start Date End Date Taking? Authorizing Provider  acetaminophen (TYLENOL) 500 MG tablet Take 1,000 mg by mouth every 6 (six) hours as needed for mild pain.   Yes Historical Provider, MD  alendronate (FOSAMAX) 70 MG tablet Take 70 mg by mouth once a week.  04/14/12  Yes Historical Provider, MD  atorvastatin (LIPITOR) 10 MG tablet Take 5 mg by mouth daily.  02/21/15  Yes Chauncey Cruel, MD  buPROPion (WELLBUTRIN XL) 150 MG 24 hr tablet Take 300 mg by mouth every morning.   Yes Historical Provider, MD  Cholecalciferol (VITAMIN D3) 2000 UNITS TABS Take 1 tablet by mouth daily.    Yes Historical Provider, MD  clorazepate (TRANXENE) 7.5 MG tablet Take 7.5-15 mg by mouth 2 (two) times daily. Takes 1 in the morning and 2 at night 07/02/12  Yes Historical Provider, MD  docusate sodium (COLACE) 100 MG capsule Take 100 mg by mouth 2 (two) times daily.   Yes Historical Provider, MD  ibuprofen (ADVIL,MOTRIN) 200 MG tablet Take 400 mg by mouth every 6 (six) hours as needed for mild pain.   Yes Historical  Provider, MD  levothyroxine (SYNTHROID, LEVOTHROID) 125 MCG tablet Take 125 mcg by mouth daily.     Yes Historical Provider, MD  tamoxifen (NOLVADEX) 20 MG tablet TAKE ONE TABLET BY MOUTH DAILY Patient taking differently: TAKE ONE TABLET BY MOUTH AT NIGHT 04/28/15  Yes Chauncey Cruel, MD  traZODone (DESYREL) 100 MG tablet Take 100 mg by mouth at bedtime.   Yes Historical Provider, MD  tretinoin (RETIN-A) 0.025 % cream Apply 1 application topically at bedtime.  07/14/12  Yes Historical Provider, MD  zolpidem (AMBIEN) 10 MG tablet Take 5 mg by mouth at bedtime as needed for sleep.   Yes Historical Provider, MD  estradiol (ESTRING) 2 MG vaginal ring Place 2 mg vaginally every 3 (three) months. follow package directions Patient not taking: Reported on 07/04/2015 02/21/15   Chauncey Cruel, MD  Estradiol 10 MCG TABS vaginal tablet Place 1 tablet (10 mcg total) vaginally as directed. Patient not taking: Reported on 07/04/2015 03/04/15   Chauncey Cruel, MD    Physical Examination: General appearance - alert, well appearing, and in no distress Mental status - alert, oriented to person, place, and time Chest - clear to auscultation, no wheezes, rales or rhonchi, symmetric air entry Heart - normal rate, regular rhythm, normal S1, S2, no murmurs, rubs, clicks or gallops Abdomen - soft,  nontender, nondistended, no masses or organomegaly Neurological - alert, oriented, normal speech, no focal findings or movement disorder noted  A left hip exam was performed. GENERAL: no acute distress SWELLING: none TENDERNESS: maximal at greater trochanter ROM: normal STRENGTH: normal GAIT: antalgic  ASSESSMENT: Left hip pain  Plan- Left hip hardware removal. Discussed in detail with the patient who elects to proceed.  Dione Plover Nickolas Chalfin, MD    07/08/2015, 1:06 PM

## 2015-07-09 NOTE — Op Note (Signed)
NAMEARCHITA, CATHELL NO.:  [id number]  MEDICAL RECORD NO.:  [id number]  LOCATION:  [facility]                         FACILITY:  [facility]  PHYSICIAN:  [name], M.D.    DATE OF BIRTH:  04/17/1955  DATE OF PROCEDURE: DATE OF DISCHARGE:  07/08/2015                              OPERATIVE REPORT   PREOPERATIVE DIAGNOSIS:  Painful hardware, left hip.  POSTOPERATIVE DIAGNOSIS:  Painful hardware, left hip.  PROCEDURE:  Left hip, hardware removal.  SURGEON:  [name], M.D.  ASSISTANT:  No assistant.  ANESTHESIA:  General.  ESTIMATED BLOOD LOSS:  Minimal.  DRAINS:  None.  COMPLICATIONS:  None.  CONDITION:  Stable to recovery.  BRIEF CLINICAL NOTE:  [name] is a 60 year old female, who had a left intertrochanteric hip fracture in [place] couple of years ago.  She has had persistent lateral hip pain.  She has responded to trochanteric bursal injections on 2 occasions.  It was felt that the pain is secondary to the screws present along the lateral cortex of her femur. She presents now for removal of the hardware.  PROCEDURE IN DETAIL:  After successful administration of general anesthetic, the patient was placed on a Hanna bed with both feet in the well-padded boots and no traction applied to either leg.  She had her left thigh isolated from her perineum and lower leg with plastic drapes and then prepped and draped in the usual sterile fashion.  Previous lateral based incision for the proximal interlock screws were utilized, and cut with a 10 blade through subcutaneous tissue.  I extended slightly more proximally, selected, identified, and removed any potential bursal tissue.  The fascia lata was identified.  There was a split in the fascia lata from the previous surgery.  I utilized a split and cut further proximally along the extent of the incision.  There was a small minor bursal tissue present which I removed.  The 2 screws were identified and removed  with the Synthes screwdriver.  Fluoroscopic film was taken in the AP plane to confirm that the hardware was removed.  I then thoroughly irrigated with saline solution and achieved excellent hemostasis.  The fascia lata was then closed with interrupted 1 Vicryl closing that previous rent in the fascia lata.  The total of 30 mL of 0.25% Marcaine were injected into the fascia lata the subcutaneous tissue and the vastus lateralis around where the incision was.  Subcu was then closed with interrupted 2-0 Vicryl and subcuticular running 4-0 Monocryl.  The incision was cleaned and dried and Steri-Strips and a bulky sterile dressing applied.  She was then awakened and transported to recovery in stable condition.     [name], M.D.     FA/MEDQ  D:  07/08/2015  T:  07/09/2015  Job:  [id number]

## 2015-07-11 ENCOUNTER — Encounter (HOSPITAL_COMMUNITY): Payer: Self-pay | Admitting: Orthopedic Surgery

## 2015-08-05 DIAGNOSIS — T8189XA Other complications of procedures, not elsewhere classified, initial encounter: Secondary | ICD-10-CM | POA: Diagnosis not present

## 2015-10-13 DIAGNOSIS — F4322 Adjustment disorder with anxiety: Secondary | ICD-10-CM | POA: Diagnosis not present

## 2015-10-18 DIAGNOSIS — F4322 Adjustment disorder with anxiety: Secondary | ICD-10-CM | POA: Diagnosis not present

## 2015-10-20 DIAGNOSIS — M810 Age-related osteoporosis without current pathological fracture: Secondary | ICD-10-CM | POA: Diagnosis not present

## 2015-10-21 DIAGNOSIS — Z4789 Encounter for other orthopedic aftercare: Secondary | ICD-10-CM | POA: Diagnosis not present

## 2015-10-21 DIAGNOSIS — Z472 Encounter for removal of internal fixation device: Secondary | ICD-10-CM | POA: Diagnosis not present

## 2015-10-26 DIAGNOSIS — F4322 Adjustment disorder with anxiety: Secondary | ICD-10-CM | POA: Diagnosis not present

## 2015-11-01 DIAGNOSIS — M25552 Pain in left hip: Secondary | ICD-10-CM | POA: Diagnosis not present

## 2015-11-02 DIAGNOSIS — F4322 Adjustment disorder with anxiety: Secondary | ICD-10-CM | POA: Diagnosis not present

## 2015-11-03 ENCOUNTER — Other Ambulatory Visit: Payer: Self-pay | Admitting: Gastroenterology

## 2015-11-03 DIAGNOSIS — M25552 Pain in left hip: Secondary | ICD-10-CM | POA: Diagnosis not present

## 2015-11-07 DIAGNOSIS — M25552 Pain in left hip: Secondary | ICD-10-CM | POA: Diagnosis not present

## 2015-11-10 DIAGNOSIS — M25552 Pain in left hip: Secondary | ICD-10-CM | POA: Diagnosis not present

## 2015-11-21 DIAGNOSIS — M25552 Pain in left hip: Secondary | ICD-10-CM | POA: Diagnosis not present

## 2015-11-24 DIAGNOSIS — M25552 Pain in left hip: Secondary | ICD-10-CM | POA: Diagnosis not present

## 2015-11-28 DIAGNOSIS — M25552 Pain in left hip: Secondary | ICD-10-CM | POA: Diagnosis not present

## 2015-12-01 DIAGNOSIS — M25552 Pain in left hip: Secondary | ICD-10-CM | POA: Diagnosis not present

## 2015-12-01 DIAGNOSIS — H1013 Acute atopic conjunctivitis, bilateral: Secondary | ICD-10-CM | POA: Diagnosis not present

## 2015-12-08 ENCOUNTER — Encounter (HOSPITAL_COMMUNITY): Payer: Self-pay | Admitting: *Deleted

## 2015-12-08 DIAGNOSIS — Z4789 Encounter for other orthopedic aftercare: Secondary | ICD-10-CM | POA: Diagnosis not present

## 2015-12-08 DIAGNOSIS — Z472 Encounter for removal of internal fixation device: Secondary | ICD-10-CM | POA: Diagnosis not present

## 2015-12-09 ENCOUNTER — Encounter (HOSPITAL_COMMUNITY): Payer: Self-pay | Admitting: *Deleted

## 2015-12-13 ENCOUNTER — Encounter (HOSPITAL_COMMUNITY): Payer: Self-pay

## 2015-12-13 ENCOUNTER — Ambulatory Visit (HOSPITAL_COMMUNITY)
Admission: RE | Admit: 2015-12-13 | Discharge: 2015-12-13 | Disposition: A | Discharge status: Home / Self Care | Payer: BLUE CROSS/BLUE SHIELD | Source: Ambulatory Visit | Attending: Gastroenterology | Admitting: Gastroenterology

## 2015-12-13 ENCOUNTER — Encounter (HOSPITAL_COMMUNITY)
Admission: RE | Disposition: A | Discharge status: Home / Self Care | Payer: Self-pay | Source: Ambulatory Visit | Attending: Gastroenterology

## 2015-12-13 DIAGNOSIS — Z853 Personal history of malignant neoplasm of breast: Secondary | ICD-10-CM | POA: Diagnosis not present

## 2015-12-13 DIAGNOSIS — Z1211 Encounter for screening for malignant neoplasm of colon: Secondary | ICD-10-CM | POA: Insufficient documentation

## 2015-12-13 DIAGNOSIS — Z79899 Other long term (current) drug therapy: Secondary | ICD-10-CM | POA: Diagnosis not present

## 2015-12-13 DIAGNOSIS — Z800 Family history of malignant neoplasm of digestive organs: Secondary | ICD-10-CM | POA: Diagnosis not present

## 2015-12-13 DIAGNOSIS — E039 Hypothyroidism, unspecified: Secondary | ICD-10-CM | POA: Diagnosis not present

## 2015-12-13 DIAGNOSIS — E7800 Pure hypercholesterolemia, unspecified: Secondary | ICD-10-CM | POA: Diagnosis not present

## 2015-12-13 HISTORY — PX: COLONOSCOPY: SHX5424

## 2015-12-13 HISTORY — DX: Hypothyroidism, unspecified: E03.9

## 2015-12-13 SURGERY — COLONOSCOPY

## 2015-12-13 MED ORDER — SODIUM CHLORIDE 0.9 % IV SOLN: INTRAVENOUS | Status: DC

## 2015-12-13 NOTE — H&P (Signed)
  Procedure: Screening colonoscopy. Father was diagnosed with colon cancer under age 61. 11/13/2010 normal screening colonoscopy was performed.  History: The patient is a 60 year old female born 08-13-1955. She is scheduled to undergo a repeat screening colonoscopy today.  Medication allergies: Ampicillin. Macrodantin.  Past medical history: Breast cancer. Hypothyroidism. Hypercholesterolemia.  Family history: Father was diagnosed with colon cancer under age 61  Exam: The patient is alert and lying comfortably on the endoscopy stretcher. Abdomen is soft and nontender to palpation. Lungs are clear to auscultation. Cardiac exam reveals a regular rhythm.  Plan: Proceed with screening colonoscopy

## 2015-12-13 NOTE — Op Note (Signed)
[facility] Patient Name: [name] Procedure Date: 12/13/2015 MRN: [id number] Attending MD: [name] , MD Date of Birth: 05/23/1955 CSN: [id number] Age: 60 Admit Type: Outpatient Procedure:                Colonoscopy Indications:              Screening for colorectal malignant neoplasm. Father                            was diagnosed with colon cancer under age 75 Providers:                [name], MD, Elmer Ramp. Tilden Dome, RN, Cherylynn Ridges, Technician Referring MD:              Medicines:                None Complications:            No immediate complications. Estimated Blood Loss:     Estimated blood loss: none. Procedure:                Pre-Anesthesia Assessment:                           - Prior to the procedure, a History and Physical                            was performed, and patient medications and                            allergies were reviewed. The patient's tolerance of                            previous anesthesia was also reviewed. The risks                            and benefits of the procedure and the sedation                            options and risks were discussed with the patient.                            All questions were answered, and informed consent                            was obtained. Prior Anticoagulants: The patient has                            taken no previous anticoagulant or antiplatelet                            agents. ASA Grade Assessment: II - A patient with  mild systemic disease. After reviewing the risks                            and benefits, the patient was deemed in                            satisfactory condition to undergo the procedure.                           After obtaining informed consent, the colonoscope                            was passed under direct vision. Throughout the                            procedure, the  patient's blood pressure, pulse, and                            oxygen saturations were monitored continuously. The                            [id number] scope was introduced through                            the anus and advanced to the the cecum, identified                            by appendiceal orifice and ileocecal valve. The                            colonoscopy was performed without difficulty. The                            patient tolerated the procedure well. The quality                            of the bowel preparation was good. The appendiceal                            orifice and the rectum were photographed. Scope In: 11:59:15 AM Scope Out: [id number] PM Scope Withdrawal Time: 0 hours 9 minutes 26 seconds  Total Procedure Duration: 0 hours 17 minutes 34 seconds  Findings:      The perianal and digital rectal examinations were normal.      The entire examined colon appeared normal. Impression:               - The entire examined colon is normal.                           - No specimens collected. Moderate Sedation:      NO sedation Recommendation:           - Patient has a contact number available for  emergencies. The signs and symptoms of potential                            delayed complications were discussed with the                            patient. Return to normal activities tomorrow.                            Written discharge instructions were provided to the                            patient.                           - Repeat colonoscopy in 5 years for screening                            purposes.                           - Resume previous diet.                           - Continue present medications. Procedure Code(s):        --- Professional ---                           [id number], Colorectal cancer screening; colonoscopy on                            individual not meeting criteria for high risk Diagnosis Code(s):         --- Professional ---                           Z12.11, Encounter for screening for malignant                            neoplasm of colon CPT copyright 2016 American Medical Association. All rights reserved. The codes documented in this report are preliminary and upon coder review may  be revised to meet current compliance requirements. Earle Gell, MD [name], MD 12/13/2015 12:24:19 PM This report has been signed electronically. Number of Addenda: 0

## 2015-12-26 DIAGNOSIS — H1013 Acute atopic conjunctivitis, bilateral: Secondary | ICD-10-CM | POA: Diagnosis not present

## 2016-01-12 DIAGNOSIS — F4322 Adjustment disorder with anxiety: Secondary | ICD-10-CM | POA: Diagnosis not present

## 2016-01-12 DIAGNOSIS — M25552 Pain in left hip: Secondary | ICD-10-CM | POA: Diagnosis not present

## 2016-01-20 DIAGNOSIS — M25552 Pain in left hip: Secondary | ICD-10-CM | POA: Diagnosis not present

## 2016-01-27 DIAGNOSIS — M25552 Pain in left hip: Secondary | ICD-10-CM | POA: Diagnosis not present

## 2016-01-30 DIAGNOSIS — M25552 Pain in left hip: Secondary | ICD-10-CM | POA: Diagnosis not present

## 2016-02-02 DIAGNOSIS — M25552 Pain in left hip: Secondary | ICD-10-CM | POA: Diagnosis not present

## 2016-02-09 DIAGNOSIS — M25552 Pain in left hip: Secondary | ICD-10-CM | POA: Diagnosis not present

## 2016-02-13 DIAGNOSIS — M25552 Pain in left hip: Secondary | ICD-10-CM | POA: Diagnosis not present

## 2016-02-14 ENCOUNTER — Other Ambulatory Visit: Payer: Self-pay | Admitting: *Deleted

## 2016-02-14 DIAGNOSIS — C50412 Malignant neoplasm of upper-outer quadrant of left female breast: Secondary | ICD-10-CM

## 2016-02-15 ENCOUNTER — Other Ambulatory Visit (HOSPITAL_BASED_OUTPATIENT_CLINIC_OR_DEPARTMENT_OTHER): Payer: BLUE CROSS/BLUE SHIELD

## 2016-02-15 DIAGNOSIS — C50412 Malignant neoplasm of upper-outer quadrant of left female breast: Secondary | ICD-10-CM | POA: Diagnosis not present

## 2016-02-15 LAB — CBC WITH DIFFERENTIAL/PLATELET
BASO%: 0.5 % (ref 0.0–2.0)
Basophils Absolute: 0 10*3/uL (ref 0.0–0.1)
EOS%: 3.2 % (ref 0.0–7.0)
Eosinophils Absolute: 0.2 10*3/uL (ref 0.0–0.5)
HEMATOCRIT: 37.6 % (ref 34.8–46.6)
HGB: 12.5 g/dL (ref 11.6–15.9)
LYMPH#: 1.9 10*3/uL (ref 0.9–3.3)
LYMPH%: 28.1 % (ref 14.0–49.7)
MCH: 28.9 pg (ref 25.1–34.0)
MCHC: 33.2 g/dL (ref 31.5–36.0)
MCV: 87 fL (ref 79.5–101.0)
MONO#: 0.6 10*3/uL (ref 0.1–0.9)
MONO%: 8.5 % (ref 0.0–14.0)
NEUT%: 59.7 % (ref 38.4–76.8)
NEUTROS ABS: 4 10*3/uL (ref 1.5–6.5)
PLATELETS: 224 10*3/uL (ref 145–400)
RBC: 4.32 10*6/uL (ref 3.70–5.45)
RDW: 12.2 % (ref 11.2–14.5)
WBC: 6.6 10*3/uL (ref 3.9–10.3)

## 2016-02-15 LAB — COMPREHENSIVE METABOLIC PANEL
ALK PHOS: 54 U/L (ref 40–150)
ALT: 25 U/L (ref 0–55)
AST: 22 U/L (ref 5–34)
Albumin: 3.9 g/dL (ref 3.5–5.0)
Anion Gap: 9 mEq/L (ref 3–11)
BILIRUBIN TOTAL: 0.39 mg/dL (ref 0.20–1.20)
BUN: 13.7 mg/dL (ref 7.0–26.0)
CALCIUM: 10.4 mg/dL (ref 8.4–10.4)
CO2: 26 mEq/L (ref 22–29)
CREATININE: 0.9 mg/dL (ref 0.6–1.1)
Chloride: 103 mEq/L (ref 98–109)
EGFR: 65 mL/min/{1.73_m2} — ABNORMAL LOW (ref >90)
GLUCOSE: 92 mg/dL (ref 70–140)
Potassium: 4.5 mEq/L (ref 3.5–5.1)
SODIUM: 138 meq/L (ref 136–145)
TOTAL PROTEIN: 7.3 g/dL (ref 6.4–8.3)

## 2016-02-16 DIAGNOSIS — M25552 Pain in left hip: Secondary | ICD-10-CM | POA: Diagnosis not present

## 2016-02-20 DIAGNOSIS — M25552 Pain in left hip: Secondary | ICD-10-CM | POA: Diagnosis not present

## 2016-02-21 ENCOUNTER — Telehealth: Payer: Self-pay | Admitting: Oncology

## 2016-02-21 ENCOUNTER — Other Ambulatory Visit: Payer: Self-pay | Admitting: *Deleted

## 2016-02-21 DIAGNOSIS — Z853 Personal history of malignant neoplasm of breast: Secondary | ICD-10-CM | POA: Diagnosis not present

## 2016-02-21 DIAGNOSIS — R922 Inconclusive mammogram: Secondary | ICD-10-CM | POA: Diagnosis not present

## 2016-02-21 NOTE — Telephone Encounter (Signed)
lvm to inform pt of r/s ov 12/8 at 130 pm per LOS

## 2016-02-22 ENCOUNTER — Ambulatory Visit: Payer: BLUE CROSS/BLUE SHIELD | Admitting: Oncology

## 2016-02-24 ENCOUNTER — Other Ambulatory Visit: Payer: Self-pay | Admitting: *Deleted

## 2016-03-06 ENCOUNTER — Telehealth: Payer: Self-pay | Admitting: Oncology

## 2016-03-06 NOTE — Telephone Encounter (Signed)
Appointment rescheduled per patient request, she will be out of town.... Available on 12/18

## 2016-03-09 ENCOUNTER — Ambulatory Visit: Payer: BLUE CROSS/BLUE SHIELD | Admitting: Oncology

## 2016-03-14 ENCOUNTER — Ambulatory Visit: Payer: BLUE CROSS/BLUE SHIELD | Admitting: Oncology

## 2016-03-19 ENCOUNTER — Ambulatory Visit (HOSPITAL_BASED_OUTPATIENT_CLINIC_OR_DEPARTMENT_OTHER): Payer: BLUE CROSS/BLUE SHIELD | Admitting: Oncology

## 2016-03-19 VITALS — BP 107/53 | HR 77 | Temp 97.6°F | Resp 18 | Ht 64.0 in | Wt 140.6 lb

## 2016-03-19 DIAGNOSIS — M810 Age-related osteoporosis without current pathological fracture: Secondary | ICD-10-CM | POA: Diagnosis not present

## 2016-03-19 DIAGNOSIS — Z170 Estrogen receptor positive status [ER+]: Secondary | ICD-10-CM

## 2016-03-19 DIAGNOSIS — C50412 Malignant neoplasm of upper-outer quadrant of left female breast: Secondary | ICD-10-CM

## 2016-03-19 NOTE — Progress Notes (Signed)
ID: [name]   DOB: 01/21/1956  MR#: [id number]  IPJ#:[id number]  PCP: Criselda Peaches, MD GYN: Manon Hilding SU: Johnathan Hausen MD OTHER MD: Norma Fredrickson, Earle Gell, [facility] Buzzy Han Aluisio  CHIEF COMPLAINT: Estrogen receptor positive breast cancer in BRCA-positive patient  CURRENT TREATMENT: Tamoxifen   HISTORY OF PRESENT ILLNESS: From the original intake note:   [name] had an unremarkable mammogram at [facility] on December 16, 2006.  Her breasts are dense, and of course that decreases the sensitivity.  In any case the screening mammogram was read as unremarkable.  In December, however, [name] had her annual examination under [name], and Dr. Irven Baltimore palpated a lump in the left breast at the 3 o'clock position.  The patient was referred back for ultrasound, and this showed a hypoechoic mass measuring 1.4 cm corresponding to the palpable abnormality.  Going back to the September mammogram, Dr. [name] was able to tease out what possibly could have been a mammographic finding, but really it was so equivocal, that even in retrospect, the mammogram was not informative.   This was followed up with breast specific gamma imaging on March 11, 2007, and this found a solitary lesion, measuring up to 1.7 cm.  In addition, there was a 1.0 cm low intensity uptake area in the axilla, considered an indeterminate finding.    With this information, the patient proceeded to biopsy under ultrasound guidance of the mass.  This was performed on December 9, and it showed an invasive ductal carcinoma, which was ER positive at 100%, PR positive at 56%, with a borderline/low proliferation marker at 17%, and Hercept test negative at 1+.  [name] was then referred to Dr. Hassell Done for further evaluation, and direction for definitive surgery, and she underwent left lumpectomy and sentinel lymph node biopsy March 13, 2007 for what proved to be ([id number]) a 2.1 cm invasive ductal carcinoma with  some micro-papillary features, grade 2, with evidence of lymphovascular invasion, but zero of two sentinel lymph nodes involved.  Margins were adequate for both the invasive and in situ components.   Her subsequent history is as detailed below.  INTERVAL HISTORY: [name] returns today for followup of her estrogen receptor positive breast cancer. She tolerates tamoxifen without significant hot flashes or vaginal wetness problems.  She obtains it at a good cost  REVIEW OF SYSTEMS: [name]  Has gone back to tenderness and is also walking on a regular basis. She feels mildly fatigued. She feels anxious at times but is dealing with this. Aside from these issues a detailed review of systems today was noncontributory.  PAST MEDICAL HISTORY: Past Medical History:  Diagnosis Date  . Anxiety   . Anxiety disorder   . BRCA2 positive 07/14/2012  . Cancer [facility]) 2008   left breast  . Dyslipidemia   . Fall   . Goiter   . History of radiation therapy   . Hypothyroidism   . Osteoporosis     PAST SURGICAL HISTORY: Past Surgical History:  Procedure Laterality Date  . ABDOMINAL HYSTERECTOMY  2009   with BSO  . BREAST LUMPECTOMY  [id number], 1991  . COLONOSCOPY N/A 12/13/2015   Procedure: COLONOSCOPY;  Surgeon: [name], MD;  Location: [facility];  Service: Endoscopy;  Laterality: N/A;  . FRACTURE SURGERY  2015   left hip  . HARDWARE REMOVAL Left 07/08/2015   Procedure: LEFT HIP HARDWARE REMOVAL;  Surgeon: [name], MD;  Location: [facility];  Service: Orthopedics;  Laterality: Left;  FAMILY HISTORY (updated OCT 2013) Family History  Problem Relation Age of Onset  . Cancer Mother   The patient's father is currently 91, with a remote history of colon cancer.  The patient's mother died at the age of 26.  She had a history of breast cancer and the patient's mother's mother died from cancer, but the actual original site of cancer is unclear.  The patient has several stepbrothers and  sisters, but only one half-sister, currently 42,with no history of cancer.   GYNECOLOGIC HISTORY: She is GX, P2.  First pregnancy age 81.  Last menstrual period age 5.  She did not take hormone replacement.    SOCIAL HISTORY: (updated OCT 2013) Of course she is [name]. He has 4 children from an earlier marriage. She has two from hers, a son who works at [employer] in [place], and a  son who lives in [place]. They have 3 grandchildren.   ADVANCED DIRECTIVES: in place  HEALTH MAINTENANCE: Social History  Substance Use Topics  . Smoking status: Former Smoker    Types: Cigarettes  . Smokeless tobacco: Never Used  . Alcohol use Yes     Comment: GLASS OF WINE MOST DAYS     Colonoscopy: August 2017/ Jerilynn Mages Johnson  PAP: April 2014  Bone density: 2013 AT [facility]/ stable osteoporosis  Lipid panel:  Allergies  Allergen Reactions  . Adhesive [Tape] Rash  . Chloraprep One Step [Chlorhexidine Gluconate] Itching    Skin tears  . Latex Rash  . Ampicillin Rash    Has patient had a PCN reaction causing immediate rash, facial/tongue/throat swelling, SOB or lightheadedness with hypotension: No Has patient had a PCN reaction causing severe rash involving mucus membranes or skin necrosis: No Has patient had a PCN reaction that required hospitalization No Has patient had a PCN reaction occurring within the last 10 years: No If all of the above answers are "NO", then may proceed with Cephalosporin use.  Clancy Gourd [Nitrofurantoin] Rash    Current Outpatient Prescriptions  Medication Sig Dispense Refill  . acetaminophen (TYLENOL) 500 MG tablet Take 1,000 mg by mouth every 6 (six) hours as needed for mild pain.    Marland Kitchen alendronate (FOSAMAX) 70 MG tablet Take 70 mg by mouth once a week.     Marland Kitchen atorvastatin (LIPITOR) 10 MG tablet Take 5 mg by mouth daily.     Marland Kitchen buPROPion (WELLBUTRIN XL) 150 MG 24 hr tablet Take 300 mg by mouth every morning.    . Cholecalciferol (VITAMIN D3) 2000 UNITS TABS Take 1  tablet by mouth daily.     . clorazepate (TRANXENE) 7.5 MG tablet Take 7.5-15 mg by mouth 2 (two) times daily. Takes 1 in the morning and 2 at night    . docusate sodium (COLACE) 100 MG capsule Take 100 mg by mouth 2 (two) times daily.    Marland Kitchen HYDROcodone-acetaminophen (NORCO) 5-325 MG tablet Take 1-2 tablets by mouth every 6 (six) hours as needed for moderate pain. (Patient taking differently: Take 1-2 tablets by mouth every 6 (six) hours as needed for moderate pain. No longer taking) 30 tablet 0  . ibuprofen (ADVIL,MOTRIN) 200 MG tablet Take 400 mg by mouth every 6 (six) hours as needed for mild pain.    Marland Kitchen levothyroxine (SYNTHROID, LEVOTHROID) 125 MCG tablet Take 125 mcg by mouth daily.      . methocarbamol (ROBAXIN) 500 MG tablet Take 1 tablet (500 mg total) by mouth 4 (four) times daily. As needed for  muscle spasm (Patient taking differently: Take 500 mg by mouth 4 (four) times daily. As needed for muscle spasm. Pt no longer taking) 30 tablet 1  . tamoxifen (NOLVADEX) 20 MG tablet TAKE ONE TABLET BY MOUTH AT NIGHT 90 tablet 3  . traZODone (DESYREL) 100 MG tablet Take 100 mg by mouth at bedtime.    . tretinoin (RETIN-A) 0.025 % cream Apply 1 application topically at bedtime.     Marland Kitchen zolpidem (AMBIEN) 10 MG tablet Take 5 mg by mouth at bedtime as needed for sleep.     No current facility-administered medications for this visit.     OBJECTIVE: Middle-aged white woman  Who appears well Vitals:   03/19/16 0935  BP: (!) 107/53  Pulse: 77  Resp: 18  Temp: 97.6 F (36.4 C)     Body mass index is 24.13 kg/m.    ECOG FS: 0 Filed Weights   03/19/16 0935  Weight: 140 lb 9.6 oz (63.8 kg)   Sclerae unicteric, EOMs intact Oropharynx clear and moist No cervical or supraclavicular adenopathy Lungs no rales or rhonchi Heart regular rate and rhythm Abd soft, nontender, positive bowel sounds MSK no focal spinal tenderness, no upper extremity lymphedema Neuro: nonfocal, well oriented, appropriate  affect Breasts:  The right breast is benign. The left breast is status post lumpectomy  LAB RESULTS: Lab Results  Component Value Date   WBC 6.6 02/15/2016   NEUTROABS 4.0 02/15/2016   HGB 12.5 02/15/2016   HCT 37.6 02/15/2016   MCV 87.0 02/15/2016   PLT 224 02/15/2016      Chemistry      Component Value Date/Time   NA 138 02/15/2016 1229   K 4.5 02/15/2016 1229   CL 106 07/05/2015 0900   CL 106 07/16/2012 1201   CO2 26 02/15/2016 1229   BUN 13.7 02/15/2016 1229   CREATININE 0.9 02/15/2016 1229      Component Value Date/Time   CALCIUM 10.4 02/15/2016 1229   ALKPHOS 54 02/15/2016 1229   AST 22 02/15/2016 1229   ALT 25 02/15/2016 1229   BILITOT 0.39 02/15/2016 1229       Lab Results  Component Value Date   LABCA2 20 01/09/2012      STUDIES:  Repeat breast MRI pending  ASSESSMENT: 60 y.o. BRCA2 positive [name] woman   (1)  status post left upper outer quadrant lumpectomy and sentinel lymph node biopsy December 2008 for a T2 N0, grade 2, invasive ductal carcinoma, strongly estrogen and progesterone receptor positive, HER2 negative with an MIB1 of 17%, in the setting of a deleterious BRCA2 mutation; with an Oncotype DX recurrence score of 20 predicting a 10-year risk of recurrence of 13% after 5 years on tamoxifen.    (2)  had taken tamoxifen for 5 years remotely for breast cancer prevention,   (3)  She started Arimidex April 2009. Therefore, her final risk of recurrence should be less than 13%.    (4) switched to tamoxifen January 2015  (5) She underwent a TAH/BSO with benign pathology in July 2009  PLAN:  [name] is now  9 years out from definitive surgery for her breast cancer with no evidence of disease recurrence. This is very favorable.  She continues to tolerate tamoxifen well. The plan is to continue that at least an additional year  We again discussed the fact that while on tamoxifen it is safe for her to use vaginal estrogens.We reviewed the  results of the recent [place] study published in the [place]  [place] which does show increased risk with vaginal estrogen preparations. That risk is very small. In my opinion tamoxifen blockage of the estrogen receptor more than makes up for the minimal estrogen absorption and  Neutralize his that risk.She does have Estring on hand. She is still considering It.  We have requested the DEXA scan from [facility] obtained in May, but she tells me her understanding as it was  Improved. She will have a repeat breast MRI this month. She will see me again in one year. She knows to call for any problems that may develop before that visit.  MAGRINAT,GUSTAV C    03/19/2016

## 2016-03-27 ENCOUNTER — Other Ambulatory Visit: Payer: Self-pay | Admitting: *Deleted

## 2016-03-28 ENCOUNTER — Other Ambulatory Visit: Payer: Self-pay | Admitting: *Deleted

## 2016-03-28 DIAGNOSIS — Z1501 Genetic susceptibility to malignant neoplasm of breast: Secondary | ICD-10-CM

## 2016-03-28 DIAGNOSIS — Z1509 Genetic susceptibility to other malignant neoplasm: Principal | ICD-10-CM

## 2016-03-28 DIAGNOSIS — Z170 Estrogen receptor positive status [ER+]: Secondary | ICD-10-CM

## 2016-03-28 DIAGNOSIS — C50412 Malignant neoplasm of upper-outer quadrant of left female breast: Secondary | ICD-10-CM

## 2016-03-31 ENCOUNTER — Ambulatory Visit
Admission: RE | Admit: 2016-03-31 | Discharge: 2016-03-31 | Disposition: A | Discharge status: Home / Self Care | Payer: BLUE CROSS/BLUE SHIELD | Source: Ambulatory Visit | Attending: Oncology | Admitting: Oncology

## 2016-03-31 DIAGNOSIS — Z1501 Genetic susceptibility to malignant neoplasm of breast: Secondary | ICD-10-CM

## 2016-03-31 DIAGNOSIS — Z170 Estrogen receptor positive status [ER+]: Secondary | ICD-10-CM

## 2016-03-31 DIAGNOSIS — C50412 Malignant neoplasm of upper-outer quadrant of left female breast: Secondary | ICD-10-CM

## 2016-03-31 DIAGNOSIS — Z1509 Genetic susceptibility to other malignant neoplasm: Principal | ICD-10-CM

## 2016-03-31 MED ORDER — GADOBENATE DIMEGLUMINE 529 MG/ML IV SOLN
13.0000 mL | Freq: Once | INTRAVENOUS | Status: AC | PRN
  Administered 2016-03-31: 13 mL via INTRAVENOUS

## 2016-04-09 DIAGNOSIS — F4322 Adjustment disorder with anxiety: Secondary | ICD-10-CM | POA: Diagnosis not present

## 2016-04-16 DIAGNOSIS — F4322 Adjustment disorder with anxiety: Secondary | ICD-10-CM | POA: Diagnosis not present

## 2016-05-07 DIAGNOSIS — L720 Epidermal cyst: Secondary | ICD-10-CM | POA: Diagnosis not present

## 2016-05-07 DIAGNOSIS — D1801 Hemangioma of skin and subcutaneous tissue: Secondary | ICD-10-CM | POA: Diagnosis not present

## 2016-05-07 DIAGNOSIS — L814 Other melanin hyperpigmentation: Secondary | ICD-10-CM | POA: Diagnosis not present

## 2016-05-07 DIAGNOSIS — L821 Other seborrheic keratosis: Secondary | ICD-10-CM | POA: Diagnosis not present

## 2016-05-07 DIAGNOSIS — L820 Inflamed seborrheic keratosis: Secondary | ICD-10-CM | POA: Diagnosis not present

## 2016-05-08 DIAGNOSIS — F4322 Adjustment disorder with anxiety: Secondary | ICD-10-CM | POA: Diagnosis not present

## 2016-06-21 DIAGNOSIS — F4322 Adjustment disorder with anxiety: Secondary | ICD-10-CM | POA: Diagnosis not present

## 2016-07-02 ENCOUNTER — Other Ambulatory Visit: Payer: Self-pay | Admitting: Internal Medicine

## 2016-07-02 DIAGNOSIS — M810 Age-related osteoporosis without current pathological fracture: Secondary | ICD-10-CM | POA: Diagnosis not present

## 2016-07-02 DIAGNOSIS — R109 Unspecified abdominal pain: Secondary | ICD-10-CM | POA: Diagnosis not present

## 2016-07-02 DIAGNOSIS — R1032 Left lower quadrant pain: Secondary | ICD-10-CM

## 2016-07-04 ENCOUNTER — Ambulatory Visit
Admission: RE | Admit: 2016-07-04 | Discharge: 2016-07-04 | Disposition: A | Discharge status: Home / Self Care | Payer: BLUE CROSS/BLUE SHIELD | Source: Ambulatory Visit | Attending: Internal Medicine | Admitting: Internal Medicine

## 2016-07-04 DIAGNOSIS — R1032 Left lower quadrant pain: Secondary | ICD-10-CM

## 2016-07-04 MED ORDER — IOPAMIDOL (ISOVUE-300) INJECTION 61%
100.0000 mL | Freq: Once | INTRAVENOUS | Status: AC | PRN
  Administered 2016-07-04: 100 mL via INTRAVENOUS

## 2016-08-02 ENCOUNTER — Ambulatory Visit
Admission: RE | Admit: 2016-08-02 | Discharge: 2016-08-02 | Disposition: A | Discharge status: Home / Self Care | Payer: BLUE CROSS/BLUE SHIELD | Source: Ambulatory Visit | Attending: Internal Medicine | Admitting: Internal Medicine

## 2016-08-02 ENCOUNTER — Other Ambulatory Visit: Payer: Self-pay | Admitting: Internal Medicine

## 2016-08-02 DIAGNOSIS — R059 Cough, unspecified: Secondary | ICD-10-CM

## 2016-08-02 DIAGNOSIS — R05 Cough: Secondary | ICD-10-CM | POA: Diagnosis not present

## 2016-08-15 ENCOUNTER — Other Ambulatory Visit: Payer: Self-pay | Admitting: *Deleted

## 2016-08-15 MED ORDER — TAMOXIFEN CITRATE 20 MG PO TABS: ORAL_TABLET | ORAL | 3 refills | Status: DC

## 2016-08-20 ENCOUNTER — Telehealth: Payer: Self-pay

## 2016-08-20 ENCOUNTER — Other Ambulatory Visit: Payer: Self-pay | Admitting: *Deleted

## 2016-08-20 MED ORDER — TAMOXIFEN CITRATE 20 MG PO TABS: ORAL_TABLET | ORAL | 3 refills | Status: DC

## 2016-08-20 NOTE — Telephone Encounter (Addendum)
Pt called for tamoxifen refill. Last script on 5/16 was printed- not e-scribed with a note "CYCLE FILL MEDICATION. Authorization is required on next refill". Forwarded to [facility] for investigation. She stated send to [facility] in [facility].

## 2016-11-02 ENCOUNTER — Observation Stay (HOSPITAL_COMMUNITY)
Admission: EM | Admit: 2016-11-02 | Discharge: 2016-11-03 | Disposition: A | Discharge status: Home / Self Care | Payer: No Typology Code available for payment source | Attending: Physician Assistant | Admitting: Physician Assistant

## 2016-11-02 ENCOUNTER — Emergency Department (HOSPITAL_COMMUNITY): Payer: No Typology Code available for payment source

## 2016-11-02 ENCOUNTER — Encounter (HOSPITAL_COMMUNITY): Payer: Self-pay | Admitting: Emergency Medicine

## 2016-11-02 DIAGNOSIS — S2243XA Multiple fractures of ribs, bilateral, initial encounter for closed fracture: Secondary | ICD-10-CM | POA: Diagnosis not present

## 2016-11-02 DIAGNOSIS — E785 Hyperlipidemia, unspecified: Secondary | ICD-10-CM | POA: Diagnosis not present

## 2016-11-02 DIAGNOSIS — S299XXA Unspecified injury of thorax, initial encounter: Secondary | ICD-10-CM | POA: Diagnosis not present

## 2016-11-02 DIAGNOSIS — F419 Anxiety disorder, unspecified: Secondary | ICD-10-CM | POA: Diagnosis not present

## 2016-11-02 DIAGNOSIS — Z880 Allergy status to penicillin: Secondary | ICD-10-CM | POA: Insufficient documentation

## 2016-11-02 DIAGNOSIS — S2241XA Multiple fractures of ribs, right side, initial encounter for closed fracture: Secondary | ICD-10-CM | POA: Diagnosis not present

## 2016-11-02 DIAGNOSIS — Z91040 Latex allergy status: Secondary | ICD-10-CM | POA: Diagnosis not present

## 2016-11-02 DIAGNOSIS — S2242XA Multiple fractures of ribs, left side, initial encounter for closed fracture: Secondary | ICD-10-CM | POA: Diagnosis not present

## 2016-11-02 DIAGNOSIS — Z79810 Long term (current) use of selective estrogen receptor modulators (SERMs): Secondary | ICD-10-CM | POA: Insufficient documentation

## 2016-11-02 DIAGNOSIS — S3991XA Unspecified injury of abdomen, initial encounter: Secondary | ICD-10-CM | POA: Diagnosis not present

## 2016-11-02 DIAGNOSIS — Z881 Allergy status to other antibiotic agents status: Secondary | ICD-10-CM | POA: Diagnosis not present

## 2016-11-02 DIAGNOSIS — Z87891 Personal history of nicotine dependence: Secondary | ICD-10-CM | POA: Diagnosis not present

## 2016-11-02 DIAGNOSIS — Z79899 Other long term (current) drug therapy: Secondary | ICD-10-CM | POA: Diagnosis not present

## 2016-11-02 DIAGNOSIS — Z9889 Other specified postprocedural states: Secondary | ICD-10-CM | POA: Diagnosis not present

## 2016-11-02 DIAGNOSIS — S2249XA Multiple fractures of ribs, unspecified side, initial encounter for closed fracture: Secondary | ICD-10-CM | POA: Diagnosis present

## 2016-11-02 DIAGNOSIS — I517 Cardiomegaly: Secondary | ICD-10-CM | POA: Insufficient documentation

## 2016-11-02 DIAGNOSIS — M810 Age-related osteoporosis without current pathological fracture: Secondary | ICD-10-CM | POA: Diagnosis not present

## 2016-11-02 DIAGNOSIS — E039 Hypothyroidism, unspecified: Secondary | ICD-10-CM | POA: Insufficient documentation

## 2016-11-02 DIAGNOSIS — Z1501 Genetic susceptibility to malignant neoplasm of breast: Secondary | ICD-10-CM | POA: Insufficient documentation

## 2016-11-02 DIAGNOSIS — Z853 Personal history of malignant neoplasm of breast: Secondary | ICD-10-CM | POA: Diagnosis not present

## 2016-11-02 DIAGNOSIS — Z91048 Other nonmedicinal substance allergy status: Secondary | ICD-10-CM | POA: Diagnosis not present

## 2016-11-02 DIAGNOSIS — S2222XA Fracture of body of sternum, initial encounter for closed fracture: Secondary | ICD-10-CM | POA: Diagnosis not present

## 2016-11-02 DIAGNOSIS — S2220XA Unspecified fracture of sternum, initial encounter for closed fracture: Secondary | ICD-10-CM | POA: Diagnosis not present

## 2016-11-02 DIAGNOSIS — I4581 Long QT syndrome: Secondary | ICD-10-CM | POA: Insufficient documentation

## 2016-11-02 DIAGNOSIS — Z923 Personal history of irradiation: Secondary | ICD-10-CM | POA: Diagnosis not present

## 2016-11-02 DIAGNOSIS — Z90710 Acquired absence of both cervix and uterus: Secondary | ICD-10-CM | POA: Insufficient documentation

## 2016-11-02 LAB — COMPREHENSIVE METABOLIC PANEL
ALK PHOS: 44 U/L (ref 38–126)
ALT: 35 U/L (ref 14–54)
ANION GAP: 9 (ref 5–15)
AST: 39 U/L (ref 15–41)
Albumin: 3.9 g/dL (ref 3.5–5.0)
BILIRUBIN TOTAL: 0.3 mg/dL (ref 0.3–1.2)
BUN: 18 mg/dL (ref 6–20)
CALCIUM: 9.3 mg/dL (ref 8.9–10.3)
CO2: 27 mmol/L (ref 22–32)
Chloride: 103 mmol/L (ref 101–111)
Creatinine, Ser: 0.87 mg/dL (ref 0.44–1.00)
GFR calc non Af Amer: >60 mL/min (ref >60)
Glucose, Bld: 110 mg/dL — ABNORMAL HIGH (ref 65–99)
POTASSIUM: 3.8 mmol/L (ref 3.5–5.1)
Sodium: 139 mmol/L (ref 135–145)
Total Protein: 6.9 g/dL (ref 6.5–8.1)

## 2016-11-02 LAB — CREATININE, SERUM
CREATININE: 0.85 mg/dL (ref 0.44–1.00)
GFR calc Af Amer: >60 mL/min (ref >60)
GFR calc non Af Amer: >60 mL/min (ref >60)

## 2016-11-02 LAB — I-STAT CHEM 8, ED
BUN: 18 mg/dL (ref 6–20)
CALCIUM ION: 1.17 mmol/L (ref 1.15–1.40)
CHLORIDE: 100 mmol/L — AB (ref 101–111)
Creatinine, Ser: 0.9 mg/dL (ref 0.44–1.00)
GLUCOSE: 106 mg/dL — AB (ref 65–99)
HCT: 37 % (ref 36.0–46.0)
Hemoglobin: 12.6 g/dL (ref 12.0–15.0)
Potassium: 3.8 mmol/L (ref 3.5–5.1)
SODIUM: 140 mmol/L (ref 135–145)
TCO2: 26 mmol/L (ref 0–100)

## 2016-11-02 LAB — CBC
HCT: 37 % (ref 36.0–46.0)
HCT: 36.3 % (ref 36.0–46.0)
HEMOGLOBIN: 12.4 g/dL (ref 12.0–15.0)
Hemoglobin: 12.5 g/dL (ref 12.0–15.0)
MCH: 28.7 pg (ref 26.0–34.0)
MCH: 28.8 pg (ref 26.0–34.0)
MCHC: 33.8 g/dL (ref 30.0–36.0)
MCHC: 34.2 g/dL (ref 30.0–36.0)
MCV: 85.1 fL (ref 78.0–100.0)
MCV: 84.2 fL (ref 78.0–100.0)
PLATELETS: 229 10*3/uL (ref 150–400)
PLATELETS: 234 10*3/uL (ref 150–400)
RBC: 4.35 MIL/uL (ref 3.87–5.11)
RBC: 4.31 MIL/uL (ref 3.87–5.11)
RDW: 12.5 % (ref 11.5–15.5)
RDW: 12.4 % (ref 11.5–15.5)
WBC: 8.1 10*3/uL (ref 4.0–10.5)
WBC: 7.8 10*3/uL (ref 4.0–10.5)

## 2016-11-02 LAB — TROPONIN I: Troponin I: <0.03 ng/mL (ref <0.03)

## 2016-11-02 MED ORDER — ONDANSETRON HCL 4 MG/2ML IJ SOLN
4.0000 mg | Freq: Four times a day (QID) | INTRAMUSCULAR | Status: DC | PRN
  Administered 2016-11-03: 4 mg via INTRAVENOUS
  Filled 2016-11-02: qty 2

## 2016-11-02 MED ORDER — MORPHINE SULFATE (PF) 2 MG/ML IV SOLN: 2.0000 mg | INTRAVENOUS | Status: DC | PRN

## 2016-11-02 MED ORDER — IOPAMIDOL (ISOVUE-300) INJECTION 61%
INTRAVENOUS | Status: AC
  Filled 2016-11-02: qty 100

## 2016-11-02 MED ORDER — SODIUM CHLORIDE 0.9% FLUSH
3.0000 mL | Freq: Two times a day (BID) | INTRAVENOUS | Status: DC
  Administered 2016-11-02 – 2016-11-03 (×2): 3 mL via INTRAVENOUS

## 2016-11-02 MED ORDER — ONDANSETRON 4 MG PO TBDP
4.0000 mg | ORAL_TABLET | Freq: Four times a day (QID) | ORAL | Status: DC | PRN
  Filled 2016-11-02: qty 1

## 2016-11-02 MED ORDER — MORPHINE SULFATE (PF) 4 MG/ML IV SOLN
4.0000 mg | Freq: Once | INTRAVENOUS | Status: AC
  Administered 2016-11-02: 4 mg via INTRAVENOUS
  Filled 2016-11-02: qty 1

## 2016-11-02 MED ORDER — IBUPROFEN 200 MG PO TABS
800.0000 mg | ORAL_TABLET | Freq: Three times a day (TID) | ORAL | Status: DC | PRN
  Administered 2016-11-03: 800 mg via ORAL
  Filled 2016-11-02: qty 4

## 2016-11-02 MED ORDER — SODIUM CHLORIDE 0.9 % IV SOLN: 250.0000 mL | INTRAVENOUS | Status: DC | PRN

## 2016-11-02 MED ORDER — SODIUM CHLORIDE 0.9 % IV BOLUS (SEPSIS)
500.0000 mL | Freq: Once | INTRAVENOUS | Status: AC
  Administered 2016-11-02: 500 mL via INTRAVENOUS

## 2016-11-02 MED ORDER — SODIUM CHLORIDE 0.9% FLUSH: 3.0000 mL | INTRAVENOUS | Status: DC | PRN

## 2016-11-02 MED ORDER — ACETAMINOPHEN 325 MG PO TABS: 650.0000 mg | ORAL_TABLET | ORAL | Status: DC | PRN

## 2016-11-02 MED ORDER — IOPAMIDOL (ISOVUE-300) INJECTION 61%
100.0000 mL | Freq: Once | INTRAVENOUS | Status: AC | PRN
  Administered 2016-11-02: 100 mL via INTRAVENOUS

## 2016-11-02 MED ORDER — ENOXAPARIN SODIUM 40 MG/0.4ML ~~LOC~~ SOLN
40.0000 mg | Freq: Every day | SUBCUTANEOUS | Status: DC
  Administered 2016-11-02: 40 mg via SUBCUTANEOUS
  Filled 2016-11-02: qty 0.4

## 2016-11-02 MED ORDER — OXYCODONE HCL 5 MG PO TABS
5.0000 mg | ORAL_TABLET | ORAL | Status: DC | PRN
  Administered 2016-11-02: 5 mg via ORAL
  Filled 2016-11-02: qty 1
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
## 2016-11-02 NOTE — ED Notes (Signed)
I reviewed the EMTALA

## 2016-11-03 ENCOUNTER — Observation Stay (HOSPITAL_COMMUNITY): Payer: No Typology Code available for payment source

## 2016-11-03 DIAGNOSIS — S2241XA Multiple fractures of ribs, right side, initial encounter for closed fracture: Secondary | ICD-10-CM | POA: Diagnosis not present

## 2016-11-03 DIAGNOSIS — Z90710 Acquired absence of both cervix and uterus: Secondary | ICD-10-CM | POA: Diagnosis not present

## 2016-11-03 DIAGNOSIS — Z881 Allergy status to other antibiotic agents status: Secondary | ICD-10-CM | POA: Diagnosis not present

## 2016-11-03 DIAGNOSIS — Z1501 Genetic susceptibility to malignant neoplasm of breast: Secondary | ICD-10-CM | POA: Diagnosis not present

## 2016-11-03 DIAGNOSIS — E785 Hyperlipidemia, unspecified: Secondary | ICD-10-CM | POA: Diagnosis not present

## 2016-11-03 DIAGNOSIS — Z79810 Long term (current) use of selective estrogen receptor modulators (SERMs): Secondary | ICD-10-CM | POA: Diagnosis not present

## 2016-11-03 DIAGNOSIS — Z91048 Other nonmedicinal substance allergy status: Secondary | ICD-10-CM | POA: Diagnosis not present

## 2016-11-03 DIAGNOSIS — Z853 Personal history of malignant neoplasm of breast: Secondary | ICD-10-CM | POA: Diagnosis not present

## 2016-11-03 DIAGNOSIS — E039 Hypothyroidism, unspecified: Secondary | ICD-10-CM | POA: Diagnosis not present

## 2016-11-03 DIAGNOSIS — Z91040 Latex allergy status: Secondary | ICD-10-CM | POA: Diagnosis not present

## 2016-11-03 DIAGNOSIS — I4581 Long QT syndrome: Secondary | ICD-10-CM | POA: Diagnosis not present

## 2016-11-03 DIAGNOSIS — F419 Anxiety disorder, unspecified: Secondary | ICD-10-CM | POA: Diagnosis not present

## 2016-11-03 DIAGNOSIS — I517 Cardiomegaly: Secondary | ICD-10-CM | POA: Diagnosis not present

## 2016-11-03 DIAGNOSIS — Z79899 Other long term (current) drug therapy: Secondary | ICD-10-CM | POA: Diagnosis not present

## 2016-11-03 DIAGNOSIS — S2242XA Multiple fractures of ribs, left side, initial encounter for closed fracture: Secondary | ICD-10-CM | POA: Diagnosis not present

## 2016-11-03 DIAGNOSIS — S2222XA Fracture of body of sternum, initial encounter for closed fracture: Secondary | ICD-10-CM | POA: Diagnosis not present

## 2016-11-03 DIAGNOSIS — S2243XA Multiple fractures of ribs, bilateral, initial encounter for closed fracture: Secondary | ICD-10-CM | POA: Diagnosis not present

## 2016-11-03 DIAGNOSIS — S2220XA Unspecified fracture of sternum, initial encounter for closed fracture: Secondary | ICD-10-CM | POA: Diagnosis not present

## 2016-11-03 DIAGNOSIS — Z880 Allergy status to penicillin: Secondary | ICD-10-CM | POA: Diagnosis not present

## 2016-11-03 LAB — HIV ANTIBODY (ROUTINE TESTING W REFLEX): HIV Screen 4th Generation wRfx: NONREACTIVE

## 2016-11-03 MED ORDER — MORPHINE SULFATE (PF) 4 MG/ML IV SOLN
2.0000 mg | INTRAVENOUS | Status: DC | PRN
  Administered 2016-11-03 (×2): 4 mg via INTRAVENOUS
  Filled 2016-11-03 (×2): qty 1

## 2016-11-03 MED ORDER — OXYCODONE HCL 5 MG PO TABS: 5.0000 mg | ORAL_TABLET | ORAL | Status: DC | PRN

## 2016-11-03 MED ORDER — OXYCODONE HCL 5 MG PO TABS
5.0000 mg | ORAL_TABLET | ORAL | Status: DC | PRN
  Administered 2016-11-03 (×2): 10 mg via ORAL
  Filled 2016-11-03 (×2): qty 2

## 2016-11-03 MED ORDER — METHOCARBAMOL 500 MG PO TABS: 500.0000 mg | ORAL_TABLET | Freq: Three times a day (TID) | ORAL | 0 refills | Status: DC

## 2016-11-03 MED ORDER — ONDANSETRON 4 MG PO TBDP: 4.0000 mg | ORAL_TABLET | Freq: Four times a day (QID) | ORAL | 0 refills | Status: DC | PRN

## 2016-11-03 MED ORDER — METHOCARBAMOL 500 MG PO TABS
500.0000 mg | ORAL_TABLET | Freq: Three times a day (TID) | ORAL | Status: DC
  Administered 2016-11-03 (×2): 500 mg via ORAL
  Filled 2016-11-03 (×2): qty 1

## 2016-11-03 MED ORDER — OXYCODONE HCL 5 MG PO TABS: 5.0000 mg | ORAL_TABLET | Freq: Four times a day (QID) | ORAL | 0 refills | Status: DC | PRN

## 2016-11-03 MED ORDER — ACETAMINOPHEN 325 MG PO TABS
650.0000 mg | ORAL_TABLET | Freq: Four times a day (QID) | ORAL | Status: DC
  Administered 2016-11-03: 650 mg via ORAL
  Filled 2016-11-03: qty 2

## 2016-11-03 MED ORDER — IBUPROFEN 200 MG PO TABS
800.0000 mg | ORAL_TABLET | Freq: Three times a day (TID) | ORAL | Status: DC
  Administered 2016-11-03: 800 mg via ORAL
  Filled 2016-11-03: qty 4

## 2016-11-03 NOTE — Plan of Care (Signed)
Problem: Safety: Goal: Ability to remain free from injury will improve Outcome: Progressing Safety precautions maintained, spouse at bedside  Problem: Pain Managment: Goal: General experience of comfort will improve Outcome: Progressing Medicated for pain, resting in the bed with eyes closed at present time

## 2016-11-03 NOTE — Discharge Instructions (Signed)
Rib Fracture ° °A rib fracture is a break or crack in one of the bones of the ribs. The ribs are a group of long, curved bones that wrap around your chest and attach to your spine. They protect your lungs and other organs in the chest cavity. A broken or cracked rib is often painful, but most do not cause other problems. Most rib fractures heal on their own over time. However, rib fractures can be more serious if multiple ribs are broken or if broken ribs move out of place and push against other structures. °What are the causes? °· A direct blow to the chest. For example, this could happen during contact sports, a car accident, or a fall against a hard object. °· Repetitive movements with high force, such as pitching a baseball or having severe coughing spells. °What are the signs or symptoms? °· Pain when you breathe in or cough. °· Pain when someone presses on the injured area. °How is this diagnosed? °Your caregiver will perform a physical exam. Various imaging tests may be ordered to confirm the diagnosis and to look for related injuries. These tests may include a chest X-ray, computed tomography (CT), magnetic resonance imaging (MRI), or a bone scan. °How is this treated? °Rib fractures usually heal on their own in 1-3 months. The longer healing period is often associated with a continued cough or other aggravating activities. During the healing period, pain control is very important. Medication is usually given to control pain. Hospitalization or surgery may be needed for more severe injuries, such as those in which multiple ribs are broken or the ribs have moved out of place. °Follow these instructions at home: °· Avoid strenuous activity and any activities or movements that cause pain. Be careful during activities and avoid bumping the injured rib. °· Gradually increase activity as directed by your caregiver. °· Only take over-the-counter or prescription medications as directed by your caregiver. Do not take  other medications without asking your caregiver first. °· Apply ice to the injured area for the first 1-2 days after you have been treated or as directed by your caregiver. Applying ice helps to reduce inflammation and pain. °? Put ice in a plastic bag. °? Place a towel between your skin and the bag. °? Leave the ice on for 15-20 minutes at a time, every 2 hours while you are awake. °· Perform deep breathing as directed by your caregiver. This will help prevent pneumonia, which is a common complication of a broken rib. Your caregiver may instruct you to: °? Take deep breaths several times a day. °? Try to cough several times a day, holding a pillow against the injured area. °? Use a device called an incentive spirometer to practice deep breathing several times a day. °· Drink enough fluids to keep your urine clear or pale yellow. This will help you avoid constipation. °· Do not wear a rib belt or binder. These restrict breathing, which can lead to pneumonia. °Get help right away if: °· You have a fever. °· You have difficulty breathing or shortness of breath. °· You develop a continual cough, or you cough up thick or bloody sputum. °· You feel sick to your stomach (nausea), throw up (vomit), or have abdominal pain. °· You have worsening pain not controlled with medications. °This information is not intended to replace advice given to you by your health care provider. Make sure you discuss any questions you have with your health care provider. °Document Released: 03/19/2005   Document Revised: 08/25/2015 Document Reviewed: 05/21/2012 °Elsevier Interactive Patient Education © 2018 [employer] ° °

## 2016-11-03 NOTE — ED Notes (Signed)
[facility] at bedside 

## 2016-11-03 NOTE — Progress Notes (Signed)
Patient for discharge home accompanied by her husband. Medications and discharge instructions explained to the patient and her husband. They verbalized understanding. Copies given to the patient including original prescriptions. IV saline lock and tele pack removed. Notified Monitor Tech.

## 2016-11-03 NOTE — ED Notes (Signed)
[facility] picked up patient and took [facility].

## 2016-11-03 NOTE — Progress Notes (Signed)
Patient was asleep and not wanted to be disturbed at this time per husband who stop me at the door.
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
## 2016-11-05 MED FILL — Morphine Sulfate Inj 4 MG/ML: INTRAMUSCULAR | Qty: 1 | Status: AC
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
## 2016-12-17 DIAGNOSIS — F4322 Adjustment disorder with anxiety: Secondary | ICD-10-CM | POA: Diagnosis not present

## 2016-12-27 ENCOUNTER — Other Ambulatory Visit: Payer: Self-pay | Admitting: Internal Medicine

## 2016-12-27 ENCOUNTER — Ambulatory Visit
Admission: RE | Admit: 2016-12-27 | Discharge: 2016-12-27 | Disposition: A | Discharge status: Home / Self Care | Payer: BLUE CROSS/BLUE SHIELD | Source: Ambulatory Visit | Attending: Internal Medicine | Admitting: Internal Medicine

## 2016-12-27 DIAGNOSIS — S2243XA Multiple fractures of ribs, bilateral, initial encounter for closed fracture: Secondary | ICD-10-CM | POA: Diagnosis not present

## 2016-12-27 DIAGNOSIS — Z09 Encounter for follow-up examination after completed treatment for conditions other than malignant neoplasm: Secondary | ICD-10-CM

## 2016-12-27 DIAGNOSIS — S2220XA Unspecified fracture of sternum, initial encounter for closed fracture: Secondary | ICD-10-CM | POA: Diagnosis not present

## 2016-12-28 DIAGNOSIS — C50919 Malignant neoplasm of unspecified site of unspecified female breast: Secondary | ICD-10-CM | POA: Diagnosis not present

## 2016-12-28 DIAGNOSIS — S2249XA Multiple fractures of ribs, unspecified side, initial encounter for closed fracture: Secondary | ICD-10-CM | POA: Diagnosis not present

## 2016-12-31 DIAGNOSIS — D2310 Other benign neoplasm of skin of unspecified eyelid, including canthus: Secondary | ICD-10-CM | POA: Diagnosis not present

## 2017-02-19 DIAGNOSIS — R922 Inconclusive mammogram: Secondary | ICD-10-CM | POA: Diagnosis not present

## 2017-02-22 ENCOUNTER — Telehealth: Payer: Self-pay | Admitting: *Deleted

## 2017-02-28 NOTE — Telephone Encounter (Signed)
No entry 

## 2017-03-14 ENCOUNTER — Other Ambulatory Visit: Payer: Self-pay

## 2017-03-14 DIAGNOSIS — C50412 Malignant neoplasm of upper-outer quadrant of left female breast: Secondary | ICD-10-CM

## 2017-03-14 DIAGNOSIS — Z170 Estrogen receptor positive status [ER+]: Secondary | ICD-10-CM

## 2017-03-14 DIAGNOSIS — Z1501 Genetic susceptibility to malignant neoplasm of breast: Secondary | ICD-10-CM

## 2017-03-14 DIAGNOSIS — Z1509 Genetic susceptibility to other malignant neoplasm: Principal | ICD-10-CM

## 2017-03-20 ENCOUNTER — Ambulatory Visit: Payer: BLUE CROSS/BLUE SHIELD | Admitting: Oncology

## 2017-03-20 ENCOUNTER — Other Ambulatory Visit: Payer: BLUE CROSS/BLUE SHIELD

## 2017-03-25 ENCOUNTER — Ambulatory Visit: Payer: BLUE CROSS/BLUE SHIELD | Admitting: Oncology

## 2017-03-25 ENCOUNTER — Other Ambulatory Visit: Payer: Self-pay | Admitting: Internal Medicine

## 2017-03-25 ENCOUNTER — Ambulatory Visit
Admission: RE | Admit: 2017-03-25 | Discharge: 2017-03-25 | Disposition: A | Discharge status: Home / Self Care | Payer: BLUE CROSS/BLUE SHIELD | Source: Ambulatory Visit | Attending: Internal Medicine | Admitting: Internal Medicine

## 2017-03-25 DIAGNOSIS — S2242XA Multiple fractures of ribs, left side, initial encounter for closed fracture: Secondary | ICD-10-CM

## 2017-03-25 DIAGNOSIS — R0781 Pleurodynia: Secondary | ICD-10-CM | POA: Diagnosis not present

## 2017-03-25 DIAGNOSIS — S299XXA Unspecified injury of thorax, initial encounter: Secondary | ICD-10-CM | POA: Diagnosis not present

## 2017-04-01 ENCOUNTER — Ambulatory Visit
Admission: RE | Admit: 2017-04-01 | Discharge: 2017-04-01 | Disposition: A | Discharge status: Home / Self Care | Payer: BLUE CROSS/BLUE SHIELD | Source: Ambulatory Visit | Attending: Oncology | Admitting: Oncology

## 2017-04-01 DIAGNOSIS — Z1509 Genetic susceptibility to other malignant neoplasm: Principal | ICD-10-CM

## 2017-04-01 DIAGNOSIS — Z1501 Genetic susceptibility to malignant neoplasm of breast: Secondary | ICD-10-CM

## 2017-04-01 DIAGNOSIS — Z803 Family history of malignant neoplasm of breast: Secondary | ICD-10-CM | POA: Diagnosis not present

## 2017-04-01 DIAGNOSIS — Z170 Estrogen receptor positive status [ER+]: Secondary | ICD-10-CM

## 2017-04-01 DIAGNOSIS — C50412 Malignant neoplasm of upper-outer quadrant of left female breast: Secondary | ICD-10-CM

## 2017-04-01 MED ORDER — GADOBENATE DIMEGLUMINE 529 MG/ML IV SOLN: 13.0000 mL | Freq: Once | INTRAVENOUS | Status: DC | PRN

## 2017-04-01 MED ORDER — GADOBENATE DIMEGLUMINE 529 MG/ML IV SOLN
13.0000 mL | Freq: Once | INTRAVENOUS | Status: AC | PRN
Start: 2017-04-01 — End: 2017-04-01
  Administered 2017-04-01: 13 mL via INTRAVENOUS

## 2017-04-11 NOTE — Progress Notes (Signed)
ID: [name]   DOB: 05/19/1955  MR#: [id number]  YWV#:[id number]  PCP: Levin Erp, MD GYN: Manon Hilding SU: Johnathan Hausen MD OTHER MD: Norma Fredrickson, Earle Gell, [facility] Buzzy Han Aluisio  CHIEF COMPLAINT: Estrogen receptor positive breast cancer in BRCA-positive patient  CURRENT TREATMENT: raloxifene   HISTORY OF PRESENT ILLNESS: From the original intake note:   [name] had an unremarkable mammogram at [facility] on December 16, 2006.  Her breasts are dense, and of course that decreases the sensitivity.  In any case the screening mammogram was read as unremarkable.  In December, however, [name] had her annual examination under [name], and Dr. Irven Baltimore palpated a lump in the left breast at the 3 o'clock position.  The patient was referred back for ultrasound, and this showed a hypoechoic mass measuring 1.4 cm corresponding to the palpable abnormality.  Going back to the September mammogram, Dr. [name] was able to tease out what possibly could have been a mammographic finding, but really it was so equivocal, that even in retrospect, the mammogram was not informative.   This was followed up with breast specific gamma imaging on March 11, 2007, and this found a solitary lesion, measuring up to 1.7 cm.  In addition, there was a 1.0 cm low intensity uptake area in the axilla, considered an indeterminate finding.    With this information, the patient proceeded to biopsy under ultrasound guidance of the mass.  This was performed on December 9, and it showed an invasive ductal carcinoma, which was ER positive at 100%, PR positive at 56%, with a borderline/low proliferation marker at 17%, and Hercept test negative at 1+.  [name] was then referred to Dr. Hassell Done for further evaluation, and direction for definitive surgery, and she underwent left lumpectomy and sentinel lymph node biopsy March 13, 2007 for what proved to be ([id number]) a 2.1 cm invasive ductal carcinoma with some  micro-papillary features, grade 2, with evidence of lymphovascular invasion, but zero of two sentinel lymph nodes involved.  Margins were adequate for both the invasive and in situ components.   Her subsequent history is as detailed below.  INTERVAL HISTORY: [name] returns today for followup of her estrogen receptor positive breast cancer. She continues on tamoxifen, with good tolerance. She denies having hot flashes. She reports having some increase in vaginal wetness. She notes that she is considering the [facility] procedure.   Since her last visit she completed a bilateral breast MRI on 04/01/2017 showing no evidence os malignancy in either breast.   On 03/25/2017 she completed a chest and rib x ray showing: Suggested subtle nondisplaced left anterolateral eighth rib fracture. No acute cardiopulmonary abnormality. Left rib series today reported separately.   REVIEW OF SYSTEMS: [name] reports that she was denies coverage for her MRI scan. She notes that her last mammogram was in November 2018 at [facility]. She notes that doesn't have a gynecologist. She reports that her PCP is Durene Cal. She notes that she is going to [place]. She reports that she fell walking up a ramp and cracked her rib. She notes that her balance is fine, but she slipped and fell. She notes that it is hard to lay down a little. She reports that she is still sensitive in her left breast from radiation. She notes that she has a small cold. She denies unusual headaches, visual changes, nausea, vomiting, or dizziness. There has been no unusual cough, phlegm production, or pleurisy. This been no change in bowel or bladder  habits. She denies unexplained fatigue or unexplained weight loss, bleeding, rash, or fever. A detailed review of systems was otherwise stable.    PAST MEDICAL HISTORY: Past Medical History:  Diagnosis Date  . Anxiety   . Anxiety disorder   . BRCA2 positive 07/14/2012  . Cancer [facility]) 2008   left  breast  . Dyslipidemia   . Fall   . Goiter   . History of radiation therapy   . Hypothyroidism   . Osteoporosis     PAST SURGICAL HISTORY: Past Surgical History:  Procedure Laterality Date  . ABDOMINAL HYSTERECTOMY  2009   with BSO  . BREAST LUMPECTOMY  [id number], 1991  . COLONOSCOPY N/A 12/13/2015   Procedure: COLONOSCOPY;  Surgeon: [name], MD;  Location: [facility] ENDOSCOPY;  Service: Endoscopy;  Laterality: N/A;  . FRACTURE SURGERY  2015   left hip  . HARDWARE REMOVAL Left 07/08/2015   Procedure: LEFT HIP HARDWARE REMOVAL;  Surgeon: [name], MD;  Location: [facility] ORS;  Service: Orthopedics;  Laterality: Left;    FAMILY HISTORY (updated OCT 2013) Family History  Problem Relation Age of Onset  . Cancer Mother   The patient's father is currently 13, with a remote history of colon cancer.  The patient's mother died at the age of 39.  She had a history of breast cancer and the patient's mother's mother died from cancer, but the actual original site of cancer is unclear.  The patient has several stepbrothers and sisters, but only one half-sister, currently 42,with no history of cancer.   GYNECOLOGIC HISTORY: She is GX, P2.  First pregnancy age 59.  Last menstrual period age 39.  She did not take hormone replacement.    SOCIAL HISTORY: (updated OCT 2013) Of course she is [name]'s wife. He has 4 children from an earlier marriage. She has two from hers, a son who works at [employer] in [place], and a  son who lives in [place]. They have 3 grandchildren.   ADVANCED DIRECTIVES: in place  HEALTH MAINTENANCE: Social History   Tobacco Use  . Smoking status: Former Smoker    Types: Cigarettes  . Smokeless tobacco: Never Used  Substance Use Topics  . Alcohol use: Yes    Comment: GLASS OF WINE MOST DAYS  . Drug use: No     Colonoscopy: August 2017/ [facility]  PAP: April 2014  Bone density: 2013 AT [facility]/ stable osteoporosis  Lipid panel:  Allergies  Allergen Reactions  .  Adhesive [Tape] Rash  . Chloraprep One Step [Chlorhexidine Gluconate] Itching    Skin tears  . Latex Rash  . Ampicillin Rash    Has patient had a PCN reaction causing immediate rash, facial/tongue/throat swelling, SOB or lightheadedness with hypotension: No Has patient had a PCN reaction causing severe rash involving mucus membranes or skin necrosis: No Has patient had a PCN reaction that required hospitalization No Has patient had a PCN reaction occurring within the last 10 years: No If all of the above answers are "NO", then may proceed with Cephalosporin use.  [name] [Nitrofurantoin] Rash    Current Outpatient Medications  Medication Sig Dispense Refill  . acetaminophen (TYLENOL) 500 MG tablet Take 1,000 mg by mouth every 6 (six) hours as needed for mild pain.    Marland Kitchen alendronate (FOSAMAX) 70 MG tablet Take 70 mg by mouth once a week.     Marland Kitchen atorvastatin (LIPITOR) 10 MG tablet Take 5 mg by mouth daily.     Marland Kitchen  buPROPion (WELLBUTRIN XL) 300 MG 24 hr tablet Take 300 mg by mouth daily.   4  . Cholecalciferol (VITAMIN D3) 2000 UNITS TABS Take 1 tablet by mouth daily.     . clorazepate (TRANXENE) 7.5 MG tablet Take 15 mg by mouth at bedtime.     . docusate sodium (COLACE) 100 MG capsule Take 100 mg by mouth daily.     Marland Kitchen ibuprofen (ADVIL,MOTRIN) 200 MG tablet Take 400 mg by mouth every 6 (six) hours as needed for mild pain.    Marland Kitchen levothyroxine (SYNTHROID, LEVOTHROID) 125 MCG tablet Take 125 mcg by mouth daily.      . methocarbamol (ROBAXIN) 500 MG tablet Take 1 tablet (500 mg total) by mouth 3 (three) times daily. Wean as able. 30 tablet 0  . ondansetron (ZOFRAN-ODT) 4 MG disintegrating tablet Take 1 tablet (4 mg total) by mouth every 6 (six) hours as needed for nausea. 20 tablet 0  . oxyCODONE (OXY IR/ROXICODONE) 5 MG immediate release tablet Take 1-2 tablets (5-10 mg total) by mouth every 6 (six) hours as needed for moderate pain or severe pain (if response to ibuprofen has been  insufficient). 30 tablet 0  . tamoxifen (NOLVADEX) 20 MG tablet TAKE ONE TABLET BY MOUTH AT NIGHT 90 tablet 3  . traZODone (DESYREL) 100 MG tablet Take 100 mg by mouth at bedtime.    . tretinoin (RETIN-A) 0.025 % cream Apply 1 application topically at bedtime.      No current facility-administered medications for this visit.     OBJECTIVE: Middle-aged white woman in no acute distress Vitals:   04/15/17 1500  BP: (!) 125/58  Pulse: 77  Resp: 16  Temp: 98.4 F (36.9 C)  SpO2: 98%     Body mass index is 24.67 kg/m.    ECOG FS: 1 Filed Weights   04/15/17 1500  Weight: 143 lb 11.2 oz (65.2 kg)   Sclerae unicteric, pupils round and equal No cervical or supraclavicular adenopathy Lungs no rales or rhonchi Heart regular rate and rhythm Abd soft, nontender, positive bowel sounds MSK no focal spinal tenderness, no upper extremity lymphedema, no pain on AP or lateral chest wall compression Neuro: nonfocal, well oriented, appropriate affect Breasts: The right breast is unremarkable.  The left breast is status post lumpectomy  Breasts:  The right breast is benign. The left breast is status post lumpectomy.  There is no evidence of local recurrence.  There is minimal distortion of the breast contour.  Both axillae are benign  LAB RESULTS: Lab Results  Component Value Date   WBC 8.1 04/15/2017   NEUTROABS 5.2 04/15/2017   HGB 11.8 04/15/2017   HCT 36.3 04/15/2017   MCV 88.8 04/15/2017   PLT 226 04/15/2017      Chemistry      Component Value Date/Time   NA 140 11/02/2016 2047   NA 138 02/15/2016 1229   K 3.8 11/02/2016 2047   K 4.5 02/15/2016 1229   CL 100 (L) 11/02/2016 2047   CL 106 07/16/2012 1201   CO2 27 11/02/2016 2034   CO2 26 02/15/2016 1229   BUN 18 11/02/2016 2047   BUN 13.7 02/15/2016 1229   CREATININE 0.85 11/02/2016 2311   CREATININE 0.9 02/15/2016 1229      Component Value Date/Time   CALCIUM 9.3 11/02/2016 2034   CALCIUM 10.4 02/15/2016 1229   ALKPHOS 44  11/02/2016 2034   ALKPHOS 54 02/15/2016 1229   AST 39 11/02/2016 2034   AST 22 02/15/2016  1229   ALT 35 11/02/2016 2034   ALT 25 02/15/2016 1229   BILITOT 0.3 11/02/2016 2034   BILITOT 0.39 02/15/2016 1229       Lab Results  Component Value Date   LABCA2 20 01/09/2012      STUDIES: Since her last visit she completed a bilateral breast MRI on 04/01/2017 showing no evidence of malignancy in either breast.   On 03/25/2017 she completed a chest and rib x ray showing: Suggested subtle nondisplaced left anterolateral eighth rib fracture. No acute cardiopulmonary abnormality. Left rib series today reported separately.  ASSESSMENT: 62 y.o. BRCA2 positive [place] woman   (1)  status post left upper outer quadrant lumpectomy and sentinel lymph node biopsy December 2008 for a T2 N0, grade 2, invasive ductal carcinoma, strongly estrogen and progesterone receptor positive, HER2 negative with an MIB1 of 17%, in the setting of a deleterious BRCA2 mutation; with an Oncotype DX recurrence score of 20 predicting a 10-year risk of recurrence of 13% after 5 years on tamoxifen.    (2)  had taken tamoxifen for 5 years remotely for breast cancer prevention,   (3)  She started Arimidex April 2009. Therefore, her final risk of recurrence should be less than 13%.    (4) switched to tamoxifen January 2015  (5) She underwent a TAH/BSO with benign pathology in July 2009  PLAN:  Ashawnti is now just over 10 years out from definitive surgery for her breast cancer with no evidence of disease recurrence.  This is very favorable and does call for celebration.  Her breast MRI is very favorable.  Unfortunately insurance is denying it because the correct diagnoses codes were not entered.  I gave her a copy of those so she can instruct both our billing people in the billing people at [facility].  I think it would be best if her imaging were staggered every 6 months instead of both the mammogram and MRI  at the end of the year.  I will see if we can get her a mammogram in May or June this year at [facility] continuing the MRI at the end of the year  She is started antiestrogens April 2009.  We have no data documenting benefit in terms of risk reduction for breast cancer for continuing antiestrogens beyond 10 years.  However of course we are dealing with a BRCA2 situation and the data that there is much more limited  In general I would favor going off tamoxifen and moving to raloxifene.  I would be more comfortable with her continuing this medication which separately is also known to decrease breast cancer risk.  She is agreeable to making this change and I have put in the appropriate prescription.  Also there are no interactions with alendronate  She may continue on raloxifene indefinitely.   She will see me again in 1 year.  She knows to call for any problems that may develop before that visit.    Magrinat, Virgie Dad, MD  04/15/17 3:22 PM Medical Oncology and Hematology [facility] [address] [place], [place] [address] Tel. [phone]    Fax. [phone]  This document serves as a record of services personally performed by Lurline Del, MD. It was created on his behalf by Sheron Nightingale, a trained medical scribe. The creation of this record is based on the scribe's personal observations and the provider's statements to them.   I have reviewed the above documentation for accuracy and completeness, and I agree with  the above.

## 2017-04-15 ENCOUNTER — Inpatient Hospital Stay: Payer: BLUE CROSS/BLUE SHIELD | Attending: Oncology

## 2017-04-15 ENCOUNTER — Telehealth: Payer: Self-pay | Admitting: Oncology

## 2017-04-15 ENCOUNTER — Inpatient Hospital Stay (HOSPITAL_BASED_OUTPATIENT_CLINIC_OR_DEPARTMENT_OTHER): Payer: BLUE CROSS/BLUE SHIELD | Admitting: Oncology

## 2017-04-15 ENCOUNTER — Encounter: Payer: Self-pay | Admitting: Oncology

## 2017-04-15 DIAGNOSIS — Z8781 Personal history of (healed) traumatic fracture: Secondary | ICD-10-CM

## 2017-04-15 DIAGNOSIS — C50412 Malignant neoplasm of upper-outer quadrant of left female breast: Secondary | ICD-10-CM

## 2017-04-15 DIAGNOSIS — Z79899 Other long term (current) drug therapy: Secondary | ICD-10-CM | POA: Insufficient documentation

## 2017-04-15 DIAGNOSIS — E309 Disorder of puberty, unspecified: Secondary | ICD-10-CM | POA: Insufficient documentation

## 2017-04-15 DIAGNOSIS — Z90710 Acquired absence of both cervix and uterus: Secondary | ICD-10-CM | POA: Insufficient documentation

## 2017-04-15 DIAGNOSIS — Z79810 Long term (current) use of selective estrogen receptor modulators (SERMs): Secondary | ICD-10-CM

## 2017-04-15 DIAGNOSIS — F419 Anxiety disorder, unspecified: Secondary | ICD-10-CM | POA: Diagnosis not present

## 2017-04-15 DIAGNOSIS — Z803 Family history of malignant neoplasm of breast: Secondary | ICD-10-CM

## 2017-04-15 DIAGNOSIS — Z87891 Personal history of nicotine dependence: Secondary | ICD-10-CM | POA: Insufficient documentation

## 2017-04-15 DIAGNOSIS — E785 Hyperlipidemia, unspecified: Secondary | ICD-10-CM | POA: Insufficient documentation

## 2017-04-15 DIAGNOSIS — Z170 Estrogen receptor positive status [ER+]: Secondary | ICD-10-CM | POA: Insufficient documentation

## 2017-04-15 DIAGNOSIS — Z1501 Genetic susceptibility to malignant neoplasm of breast: Secondary | ICD-10-CM | POA: Insufficient documentation

## 2017-04-15 DIAGNOSIS — M810 Age-related osteoporosis without current pathological fracture: Secondary | ICD-10-CM

## 2017-04-15 DIAGNOSIS — Z923 Personal history of irradiation: Secondary | ICD-10-CM | POA: Diagnosis not present

## 2017-04-15 DIAGNOSIS — C50912 Malignant neoplasm of unspecified site of left female breast: Secondary | ICD-10-CM | POA: Insufficient documentation

## 2017-04-15 DIAGNOSIS — Z1239 Encounter for other screening for malignant neoplasm of breast: Secondary | ICD-10-CM | POA: Insufficient documentation

## 2017-04-15 DIAGNOSIS — Z1502 Genetic susceptibility to malignant neoplasm of ovary: Secondary | ICD-10-CM | POA: Insufficient documentation

## 2017-04-15 LAB — CBC WITH DIFFERENTIAL/PLATELET
Basophils Absolute: 0 10*3/uL (ref 0.0–0.1)
Basophils Relative: 0 %
EOS PCT: 2 %
Eosinophils Absolute: 0.1 10*3/uL (ref 0.0–0.5)
HEMATOCRIT: 36.3 % (ref 34.8–46.6)
HEMOGLOBIN: 11.8 g/dL (ref 11.6–15.9)
LYMPHS ABS: 2.1 10*3/uL (ref 0.9–3.3)
Lymphocytes Relative: 26 %
MCH: 28.9 pg (ref 25.1–34.0)
MCHC: 32.5 g/dL (ref 31.5–36.0)
MCV: 88.8 fL (ref 79.5–101.0)
Monocytes Absolute: 0.6 10*3/uL (ref 0.1–0.9)
Monocytes Relative: 8 %
NEUTROS PCT: 64 %
Neutro Abs: 5.2 10*3/uL (ref 1.5–6.5)
Platelets: 226 10*3/uL (ref 145–400)
RBC: 4.09 MIL/uL (ref 3.70–5.45)
RDW: 12.3 % (ref 11.2–16.1)
WBC: 8.1 10*3/uL (ref 3.9–10.3)

## 2017-04-15 LAB — COMPREHENSIVE METABOLIC PANEL
ALBUMIN: 3.5 g/dL (ref 3.5–5.0)
ALT: 50 U/L (ref 0–55)
AST: 32 U/L (ref 5–34)
Alkaline Phosphatase: 93 U/L (ref 40–150)
Anion gap: 9 (ref 3–11)
BUN: 13 mg/dL (ref 7–26)
CO2: 27 mmol/L (ref 22–29)
Calcium: 9.6 mg/dL (ref 8.4–10.4)
Chloride: 104 mmol/L (ref 98–109)
Creatinine, Ser: 0.94 mg/dL (ref 0.60–1.10)
GFR calc Af Amer: >60 mL/min (ref >60)
Glucose, Bld: 86 mg/dL (ref 70–140)
Potassium: 4.3 mmol/L (ref 3.3–4.7)
Sodium: 140 mmol/L (ref 136–145)
Total Bilirubin: 0.3 mg/dL (ref 0.2–1.2)
Total Protein: 7.1 g/dL (ref 6.4–8.3)

## 2017-04-15 MED ORDER — RALOXIFENE HCL 60 MG PO TABS: 60.0000 mg | ORAL_TABLET | Freq: Every day | ORAL | 4 refills | Status: DC

## 2017-04-15 NOTE — Telephone Encounter (Signed)
Scheduled appt per 1/14 los - patient did not want avs or calendar.  °

## 2017-05-22 DIAGNOSIS — M810 Age-related osteoporosis without current pathological fracture: Secondary | ICD-10-CM | POA: Diagnosis not present

## 2017-05-22 DIAGNOSIS — S2249XA Multiple fractures of ribs, unspecified side, initial encounter for closed fracture: Secondary | ICD-10-CM | POA: Diagnosis not present

## 2017-05-22 DIAGNOSIS — L918 Other hypertrophic disorders of the skin: Secondary | ICD-10-CM | POA: Diagnosis not present

## 2017-05-24 DIAGNOSIS — F4322 Adjustment disorder with anxiety: Secondary | ICD-10-CM | POA: Diagnosis not present

## 2017-06-27 DIAGNOSIS — F4322 Adjustment disorder with anxiety: Secondary | ICD-10-CM | POA: Diagnosis not present

## 2017-06-28 DIAGNOSIS — F4322 Adjustment disorder with anxiety: Secondary | ICD-10-CM | POA: Diagnosis not present

## 2017-07-22 DIAGNOSIS — D649 Anemia, unspecified: Secondary | ICD-10-CM | POA: Diagnosis not present

## 2017-07-24 DIAGNOSIS — L900 Lichen sclerosus et atrophicus: Secondary | ICD-10-CM | POA: Diagnosis not present

## 2017-09-16 DIAGNOSIS — L814 Other melanin hyperpigmentation: Secondary | ICD-10-CM | POA: Diagnosis not present

## 2017-09-16 DIAGNOSIS — L821 Other seborrheic keratosis: Secondary | ICD-10-CM | POA: Diagnosis not present

## 2017-09-16 DIAGNOSIS — D1801 Hemangioma of skin and subcutaneous tissue: Secondary | ICD-10-CM | POA: Diagnosis not present

## 2017-09-16 DIAGNOSIS — D225 Melanocytic nevi of trunk: Secondary | ICD-10-CM | POA: Diagnosis not present

## 2017-10-07 DIAGNOSIS — F4322 Adjustment disorder with anxiety: Secondary | ICD-10-CM | POA: Diagnosis not present

## 2017-11-15 DIAGNOSIS — F4322 Adjustment disorder with anxiety: Secondary | ICD-10-CM | POA: Diagnosis not present

## 2018-01-03 DIAGNOSIS — H1013 Acute atopic conjunctivitis, bilateral: Secondary | ICD-10-CM | POA: Diagnosis not present

## 2018-01-03 DIAGNOSIS — D2310 Other benign neoplasm of skin of unspecified eyelid, including canthus: Secondary | ICD-10-CM | POA: Diagnosis not present

## 2018-02-04 DIAGNOSIS — M810 Age-related osteoporosis without current pathological fracture: Secondary | ICD-10-CM | POA: Diagnosis not present

## 2018-02-04 DIAGNOSIS — Z8262 Family history of osteoporosis: Secondary | ICD-10-CM | POA: Diagnosis not present

## 2018-02-04 DIAGNOSIS — Z853 Personal history of malignant neoplasm of breast: Secondary | ICD-10-CM | POA: Diagnosis not present

## 2018-03-04 ENCOUNTER — Encounter: Payer: Self-pay | Admitting: Oncology

## 2018-03-04 DIAGNOSIS — Z1231 Encounter for screening mammogram for malignant neoplasm of breast: Secondary | ICD-10-CM | POA: Diagnosis not present

## 2018-03-04 DIAGNOSIS — Z853 Personal history of malignant neoplasm of breast: Secondary | ICD-10-CM | POA: Diagnosis not present

## 2018-03-27 DIAGNOSIS — M810 Age-related osteoporosis without current pathological fracture: Secondary | ICD-10-CM | POA: Diagnosis not present

## 2018-03-27 DIAGNOSIS — Z23 Encounter for immunization: Secondary | ICD-10-CM | POA: Diagnosis not present

## 2018-03-27 DIAGNOSIS — E039 Hypothyroidism, unspecified: Secondary | ICD-10-CM | POA: Diagnosis not present

## 2018-03-27 DIAGNOSIS — C50911 Malignant neoplasm of unspecified site of right female breast: Secondary | ICD-10-CM | POA: Diagnosis not present

## 2018-04-13 NOTE — Progress Notes (Signed)
ID: [name]   DOB: 12/02/1955  MR#: [id number]  CSN#:[id number]  Patient Care Team: Green, Edwin, MD as PCP - General (Internal Medicine) Magrinat, Gustav C, MD as Consulting Physician (Oncology) Martin, Matthew, MD as Consulting Physician (General Surgery) Plovsky, Gerald, MD as Consulting Physician (Psychiatry) Johnson, Martin K, MD as Consulting Physician (Gastroenterology) Gruber, Hope, MD as Attending Physician (Dermatology) Aluisio, Frank, MD as Consulting Physician (Orthopedic Surgery) Mody, Vaishali, MD as Consulting Physician (Obstetrics and Gynecology)  OTHER MD: V. Mody  CHIEF COMPLAINT: Estrogen receptor positive breast cancer in BRCA-positive patient  CURRENT TREATMENT: raloxifene   HISTORY OF PRESENT ILLNESS: From the original intake note:   [name] had an unremarkable mammogram at [facility] on December 16, 2006.  Her breasts are dense, and of course that decreases the sensitivity.  In any case the screening mammogram was read as unremarkable.  In December, however, [name] had her annual examination under [facility], and Dr. Dickstein palpated a lump in the left breast at the 3 o'clock position.  The patient was referred back for ultrasound, and this showed a hypoechoic mass measuring 1.4 cm corresponding to the palpable abnormality.  Going back to the September mammogram, Dr. [facility] was able to tease out what possibly could have been a mammographic finding, but really it was so equivocal, that even in retrospect, the mammogram was not informative.   This was followed up with breast specific gamma imaging on March 11, 2007, and this found a solitary lesion, measuring up to 1.7 cm.  In addition, there was a 1.0 cm low intensity uptake area in the axilla, considered an indeterminate finding.    With this information, the patient proceeded to biopsy under ultrasound guidance of the mass.  This was performed on December 9, and it showed an invasive ductal  carcinoma, which was ER positive at 100%, PR positive at 56%, with a borderline/low proliferation marker at 17%, and Hercept test negative at 1+.  [name] was then referred to Dr. Martin for further evaluation, and direction for definitive surgery, and she underwent left lumpectomy and sentinel lymph node biopsy March 13, 2007 for what proved to be ([id number]) a 2.1 cm invasive ductal carcinoma with some micro-papillary features, grade 2, with evidence of lymphovascular invasion, but zero of two sentinel lymph nodes involved.  Margins were adequate for both the invasive and in situ components.   Her subsequent history is as detailed below.  INTERVAL HISTORY: [name] returns today for follow-up and treatment of of her estrogen receptor positive breast cancer.   The patient continues on raloxifene/Evista. She tolerates this well and without any noticeable side effects. She receives her medication without having to pay anything.   Since her last visit here, she underwent a bilateral screening mammogram on 03/04/2018 showing no mammographic evidence of malignancy.    REVIEW OF SYSTEMS: [name] has been doing intermittent fasting; so she won't eat between 2:30 PM and 8:30 PM.  In between she has only none nutritional and non-flavored liquids (coffee and herbal tea are an exception).  She states that her energy levels have been great since starting her fasting routine. The patient denies unusual headaches, visual changes, nausea, vomiting, or dizziness. There has been no unusual cough, phlegm production, or pleurisy. This been no change in bowel or bladder habits. The patient denies unexplained fatigue or unexplained weight loss, bleeding, rash, or fever. A detailed review of systems was otherwise noncontributory.    PAST MEDICAL HISTORY: Past Medical History:  Diagnosis Date  .   Anxiety   . Anxiety disorder   . BRCA2 positive 07/14/2012  . Cancer [facility]) 2008   left breast  . Dyslipidemia   .  Fall   . Goiter   . History of radiation therapy   . Hypothyroidism   . Osteoporosis     PAST SURGICAL HISTORY: Past Surgical History:  Procedure Laterality Date  . ABDOMINAL HYSTERECTOMY  2009   with BSO  . BREAST LUMPECTOMY  [id number], 1991  . COLONOSCOPY N/A 12/13/2015   Procedure: COLONOSCOPY;  Surgeon: [name], MD;  Location: [facility];  Service: Endoscopy;  Laterality: N/A;  . FRACTURE SURGERY  2015   left hip  . HARDWARE REMOVAL Left 07/08/2015   Procedure: LEFT HIP HARDWARE REMOVAL;  Surgeon: [name], MD;  Location: [facility];  Service: Orthopedics;  Laterality: Left;    FAMILY HISTORY (updated OCT 2013) Family History  Problem Relation Age of Onset  . Cancer Mother    The patient's father is currently 59, with a remote history of colon cancer.  The patient's mother died at the age of 12.  She had a history of breast cancer and the patient's mother's mother died from cancer, but the actual original site of cancer is unclear.  The patient has several stepbrothers and sisters, but only one half-sister, currently 42,with no history of cancer.    GYNECOLOGIC HISTORY: She is GX, P2.  First pregnancy age 18.  Last menstrual period age 65.  She did not take hormone replacement.     SOCIAL HISTORY: (updated OCT 2013) Of course she is [name]. He has 4 children from an earlier marriage. She has two from hers, a son who works at [employer] in [place], and a  son who lives in [place]. They have 3 grandchildren.   ADVANCED DIRECTIVES: in place  HEALTH MAINTENANCE: Social History   Tobacco Use  . Smoking status: Former Smoker    Types: Cigarettes  . Smokeless tobacco: Never Used  Substance Use Topics  . Alcohol use: Yes    Comment: GLASS OF WINE MOST DAYS  . Drug use: No     Colonoscopy: August 2017/ [facility]  PAP: April 2014  Bone density: 2013 AT [facility]/ stable osteoporosis  Lipid panel:  Allergies  Allergen Reactions  . Adhesive [Tape] Rash   . Chloraprep One Step [Chlorhexidine Gluconate] Itching    Skin tears  . Latex Rash  . Ampicillin Rash    Has patient had a PCN reaction causing immediate rash, facial/tongue/throat swelling, SOB or lightheadedness with hypotension: No Has patient had a PCN reaction causing severe rash involving mucus membranes or skin necrosis: No Has patient had a PCN reaction that required hospitalization No Has patient had a PCN reaction occurring within the last 10 years: No If all of the above answers are "NO", then may proceed with Cephalosporin use.  Clancy Gourd [Nitrofurantoin] Rash    Current Outpatient Medications  Medication Sig Dispense Refill  . alendronate (FOSAMAX) 70 MG tablet Take 70 mg by mouth once a week.     Marland Kitchen atorvastatin (LIPITOR) 10 MG tablet Take 5 mg by mouth daily.     Marland Kitchen buPROPion (WELLBUTRIN XL) 300 MG 24 hr tablet Take 300 mg by mouth daily.   4  . Cholecalciferol (VITAMIN D3) 2000 UNITS TABS Take 1 tablet by mouth daily.     . clorazepate (TRANXENE) 7.5 MG tablet Take 15 mg by mouth at bedtime.     Marland Kitchen ibuprofen (ADVIL,MOTRIN)  200 MG tablet Take 400 mg by mouth every 6 (six) hours as needed for mild pain.    . levothyroxine (SYNTHROID, LEVOTHROID) 125 MCG tablet Take 125 mcg by mouth daily.      . raloxifene (EVISTA) 60 MG tablet Take 1 tablet (60 mg total) by mouth daily. 90 tablet 4  . traZODone (DESYREL) 100 MG tablet Take 100 mg by mouth at bedtime.    . tretinoin (RETIN-A) 0.025 % cream Apply 1 application topically at bedtime.      No current facility-administered medications for this visit.     OBJECTIVE: Middle-aged white woman who appears well  Vitals:   04/14/18 0948  BP: (!) 123/56  Pulse: 68  Resp: 18  Temp: 97.9 F (36.6 C)  SpO2: 97%     Body mass index is 24.34 kg/m.    ECOG FS: 1 Filed Weights   04/14/18 0948  Weight: 141 lb 12.8 oz (64.3 kg)   Sclerae unicteric, EOMs intact No cervical or supraclavicular adenopathy Lungs no rales or  rhonchi Heart regular rate and rhythm Abd soft, nontender, positive bowel sounds MSK no focal spinal tenderness, no upper extremity lymphedema Neuro: nonfocal, well oriented, appropriate affect Breasts: Right breast is unremarkable.  The left breast is status post lumpectomy.  There are no suspicious masses.  There is no skin or nipple change of concern.  Both axillae are benign.  LAB RESULTS: Lab Results  Component Value Date   WBC 5.7 04/14/2018   NEUTROABS 3.2 04/14/2018   HGB 12.8 04/14/2018   HCT 38.4 04/14/2018   MCV 86.1 04/14/2018   PLT 228 04/14/2018      Chemistry      Component Value Date/Time   NA 140 04/15/2017 1440   NA 138 02/15/2016 1229   K 4.3 04/15/2017 1440   K 4.5 02/15/2016 1229   CL 104 04/15/2017 1440   CL 106 07/16/2012 1201   CO2 27 04/15/2017 1440   CO2 26 02/15/2016 1229   BUN 13 04/15/2017 1440   BUN 13.7 02/15/2016 1229   CREATININE 0.94 04/15/2017 1440   CREATININE 0.9 02/15/2016 1229      Component Value Date/Time   CALCIUM 9.6 04/15/2017 1440   CALCIUM 10.4 02/15/2016 1229   ALKPHOS 93 04/15/2017 1440   ALKPHOS 54 02/15/2016 1229   AST 32 04/15/2017 1440   AST 22 02/15/2016 1229   ALT 50 04/15/2017 1440   ALT 25 02/15/2016 1229   BILITOT 0.3 04/15/2017 1440   BILITOT 0.39 02/15/2016 1229       Lab Results  Component Value Date   LABCA2 20 01/09/2012      STUDIES: Mammography 03/04/2018 reviewed  ASSESSMENT: 62 y.o. BRCA2 positive [place] woman   (1)  status post left upper outer quadrant lumpectomy and sentinel lymph node biopsy December 2008 for a T2 N0, grade 2, invasive ductal carcinoma, strongly estrogen and progesterone receptor positive, HER2 negative with an MIB1 of 17%, in the setting of a deleterious BRCA2 mutation; with an Oncotype DX recurrence score of 20 predicting a 10-year risk of recurrence of 13% after 5 years on tamoxifen.    (2)  had taken tamoxifen for 5 years remotely for breast cancer prevention,    (3)  She started Arimidex April 2009. Therefore, her final risk of recurrence should be less than 13%.    (4) switched to tamoxifen January 2015  (a) switched to raloxifene January 2019  (5) She underwent a TAH/BSO with benign pathology in July   2009  (6) on intensified screening, receiving mammography in December and breast MRI in May yearly   PLAN:  Analissa is now a little over 11 years out from definitive surgery for her breast cancer with no evidence of disease recurrence.  This is very favorable.  She is tolerating the raloxifene well and the plan is to continue that indefinitely.  We are continuing intensified screening.  She had mammography in December which showed the breast density still to be at C.  She will have breast MRI in May.  I suggested that she ask when they call her to schedule whether it has been appropriately ordered so that the fact that she has a history of breast cancer and is BRCA positive is in the order.  Otherwise she will return to see me in a year.  She knows to call for any other issues that may develop before that.    Ksean Vale, Virgie Dad, MD  04/14/18 10:16 AM Medical Oncology and Hematology [facility] [address] [place], [place] [address] Tel. [phone]    Fax. [phone]  I, [name] am acting as a Education administrator for Chauncey Cruel, MD.   I, Lurline Del MD, have reviewed the above documentation for accuracy and completeness, and I agree with the above.

## 2018-04-14 ENCOUNTER — Inpatient Hospital Stay: Payer: BLUE CROSS/BLUE SHIELD

## 2018-04-14 ENCOUNTER — Telehealth: Payer: Self-pay | Admitting: Oncology

## 2018-04-14 ENCOUNTER — Inpatient Hospital Stay: Payer: BLUE CROSS/BLUE SHIELD | Attending: Oncology | Admitting: Oncology

## 2018-04-14 VITALS — BP 123/56 | HR 68 | Temp 97.9°F | Resp 18 | Wt 141.8 lb

## 2018-04-14 DIAGNOSIS — Z90722 Acquired absence of ovaries, bilateral: Secondary | ICD-10-CM | POA: Diagnosis not present

## 2018-04-14 DIAGNOSIS — E039 Hypothyroidism, unspecified: Secondary | ICD-10-CM | POA: Insufficient documentation

## 2018-04-14 DIAGNOSIS — Z87891 Personal history of nicotine dependence: Secondary | ICD-10-CM | POA: Insufficient documentation

## 2018-04-14 DIAGNOSIS — E785 Hyperlipidemia, unspecified: Secondary | ICD-10-CM | POA: Diagnosis not present

## 2018-04-14 DIAGNOSIS — Z1501 Genetic susceptibility to malignant neoplasm of breast: Secondary | ICD-10-CM | POA: Insufficient documentation

## 2018-04-14 DIAGNOSIS — C50412 Malignant neoplasm of upper-outer quadrant of left female breast: Secondary | ICD-10-CM

## 2018-04-14 DIAGNOSIS — Z923 Personal history of irradiation: Secondary | ICD-10-CM | POA: Insufficient documentation

## 2018-04-14 DIAGNOSIS — F419 Anxiety disorder, unspecified: Secondary | ICD-10-CM | POA: Diagnosis not present

## 2018-04-14 DIAGNOSIS — Z800 Family history of malignant neoplasm of digestive organs: Secondary | ICD-10-CM | POA: Diagnosis not present

## 2018-04-14 DIAGNOSIS — M810 Age-related osteoporosis without current pathological fracture: Secondary | ICD-10-CM | POA: Diagnosis not present

## 2018-04-14 DIAGNOSIS — Z79899 Other long term (current) drug therapy: Secondary | ICD-10-CM

## 2018-04-14 DIAGNOSIS — Z809 Family history of malignant neoplasm, unspecified: Secondary | ICD-10-CM | POA: Diagnosis not present

## 2018-04-14 DIAGNOSIS — Z90710 Acquired absence of both cervix and uterus: Secondary | ICD-10-CM | POA: Diagnosis not present

## 2018-04-14 DIAGNOSIS — C50912 Malignant neoplasm of unspecified site of left female breast: Secondary | ICD-10-CM

## 2018-04-14 DIAGNOSIS — Z170 Estrogen receptor positive status [ER+]: Principal | ICD-10-CM

## 2018-04-14 DIAGNOSIS — Z79811 Long term (current) use of aromatase inhibitors: Secondary | ICD-10-CM | POA: Diagnosis not present

## 2018-04-14 LAB — COMPREHENSIVE METABOLIC PANEL
ALBUMIN: 3.9 g/dL (ref 3.5–5.0)
ALT: 19 U/L (ref 0–44)
ANION GAP: 5 (ref 5–15)
AST: 17 U/L (ref 15–41)
Alkaline Phosphatase: 54 U/L (ref 38–126)
BILIRUBIN TOTAL: 0.4 mg/dL (ref 0.3–1.2)
BUN: 15 mg/dL (ref 8–23)
CO2: 26 mmol/L (ref 22–32)
Calcium: 9.3 mg/dL (ref 8.9–10.3)
Chloride: 105 mmol/L (ref 98–111)
Creatinine, Ser: 0.99 mg/dL (ref 0.44–1.00)
GFR calc Af Amer: >60 mL/min (ref >60)
GFR calc non Af Amer: >60 mL/min (ref >60)
GLUCOSE: 103 mg/dL — AB (ref 70–99)
POTASSIUM: 4.3 mmol/L (ref 3.5–5.1)
SODIUM: 136 mmol/L (ref 135–145)
Total Protein: 6.9 g/dL (ref 6.5–8.1)

## 2018-04-14 LAB — CBC WITH DIFFERENTIAL/PLATELET
ABS IMMATURE GRANULOCYTES: 0.02 10*3/uL (ref 0.00–0.07)
Basophils Absolute: 0.1 10*3/uL (ref 0.0–0.1)
Basophils Relative: 1 %
Eosinophils Absolute: 0.2 10*3/uL (ref 0.0–0.5)
Eosinophils Relative: 4 %
HEMATOCRIT: 38.4 % (ref 36.0–46.0)
Hemoglobin: 12.8 g/dL (ref 12.0–15.0)
IMMATURE GRANULOCYTES: 0 %
LYMPHS ABS: 1.8 10*3/uL (ref 0.7–4.0)
Lymphocytes Relative: 32 %
MCH: 28.7 pg (ref 26.0–34.0)
MCHC: 33.3 g/dL (ref 30.0–36.0)
MCV: 86.1 fL (ref 80.0–100.0)
MONOS PCT: 8 %
Monocytes Absolute: 0.5 10*3/uL (ref 0.1–1.0)
NEUTROS ABS: 3.2 10*3/uL (ref 1.7–7.7)
NEUTROS PCT: 55 %
Platelets: 228 10*3/uL (ref 150–400)
RBC: 4.46 MIL/uL (ref 3.87–5.11)
RDW: 12 % (ref 11.5–15.5)
WBC: 5.7 10*3/uL (ref 4.0–10.5)
nRBC: 0 % (ref 0.0–0.2)

## 2018-04-14 NOTE — Telephone Encounter (Signed)
Gave avs and calendar ° °

## 2018-04-22 DIAGNOSIS — F4322 Adjustment disorder with anxiety: Secondary | ICD-10-CM | POA: Diagnosis not present

## 2018-04-29 ENCOUNTER — Other Ambulatory Visit: Payer: Self-pay | Admitting: Oncology

## 2018-08-08 DIAGNOSIS — H353131 Nonexudative age-related macular degeneration, bilateral, early dry stage: Secondary | ICD-10-CM | POA: Diagnosis not present

## 2018-09-05 ENCOUNTER — Other Ambulatory Visit: Payer: Self-pay

## 2018-09-05 ENCOUNTER — Ambulatory Visit
Admission: RE | Admit: 2018-09-05 | Discharge: 2018-09-05 | Disposition: A | Discharge status: Home / Self Care | Payer: BC Managed Care – PPO | Source: Ambulatory Visit | Attending: Internal Medicine | Admitting: Internal Medicine

## 2018-09-05 ENCOUNTER — Other Ambulatory Visit: Payer: Self-pay | Admitting: Internal Medicine

## 2018-09-05 DIAGNOSIS — S299XXA Unspecified injury of thorax, initial encounter: Secondary | ICD-10-CM | POA: Diagnosis not present

## 2018-09-05 DIAGNOSIS — Z23 Encounter for immunization: Secondary | ICD-10-CM | POA: Diagnosis not present

## 2018-09-05 DIAGNOSIS — S2242XA Multiple fractures of ribs, left side, initial encounter for closed fracture: Secondary | ICD-10-CM | POA: Diagnosis not present

## 2018-09-05 DIAGNOSIS — R0781 Pleurodynia: Secondary | ICD-10-CM

## 2018-09-08 ENCOUNTER — Ambulatory Visit
Admission: RE | Admit: 2018-09-08 | Discharge: 2018-09-08 | Disposition: A | Discharge status: Home / Self Care | Payer: BC Managed Care – PPO | Source: Ambulatory Visit | Attending: Oncology | Admitting: Oncology

## 2018-09-08 ENCOUNTER — Other Ambulatory Visit: Payer: Self-pay

## 2018-09-08 DIAGNOSIS — Z803 Family history of malignant neoplasm of breast: Secondary | ICD-10-CM | POA: Diagnosis not present

## 2018-09-08 DIAGNOSIS — Z1501 Genetic susceptibility to malignant neoplasm of breast: Secondary | ICD-10-CM

## 2018-09-08 DIAGNOSIS — C50912 Malignant neoplasm of unspecified site of left female breast: Secondary | ICD-10-CM

## 2018-09-08 DIAGNOSIS — C50412 Malignant neoplasm of upper-outer quadrant of left female breast: Secondary | ICD-10-CM

## 2018-09-08 MED ORDER — GADOBUTROL 1 MMOL/ML IV SOLN
7.0000 mL | Freq: Once | INTRAVENOUS | Status: AC | PRN
  Administered 2018-09-08: 7 mL via INTRAVENOUS

## 2018-09-09 ENCOUNTER — Telehealth: Payer: Self-pay | Admitting: Oncology

## 2018-09-09 ENCOUNTER — Other Ambulatory Visit: Payer: Self-pay | Admitting: Oncology

## 2018-09-09 ENCOUNTER — Other Ambulatory Visit: Payer: Self-pay | Admitting: Adult Health

## 2018-09-09 DIAGNOSIS — C50412 Malignant neoplasm of upper-outer quadrant of left female breast: Secondary | ICD-10-CM

## 2018-09-09 DIAGNOSIS — Z1501 Genetic susceptibility to malignant neoplasm of breast: Secondary | ICD-10-CM

## 2018-09-09 DIAGNOSIS — C50912 Malignant neoplasm of unspecified site of left female breast: Secondary | ICD-10-CM

## 2018-09-09 NOTE — Telephone Encounter (Signed)
Scheduled appt per 6/9 sch message - unable to reach patient. Left message with appt date and time

## 2018-09-10 ENCOUNTER — Encounter: Payer: Self-pay | Admitting: Oncology

## 2018-09-10 ENCOUNTER — Other Ambulatory Visit: Payer: Self-pay | Admitting: Radiology

## 2018-09-10 DIAGNOSIS — N6314 Unspecified lump in the right breast, lower inner quadrant: Secondary | ICD-10-CM | POA: Diagnosis not present

## 2018-09-10 DIAGNOSIS — N6313 Unspecified lump in the right breast, lower outer quadrant: Secondary | ICD-10-CM | POA: Diagnosis not present

## 2018-09-10 DIAGNOSIS — N6315 Unspecified lump in the right breast, overlapping quadrants: Secondary | ICD-10-CM | POA: Diagnosis not present

## 2018-09-10 DIAGNOSIS — Z853 Personal history of malignant neoplasm of breast: Secondary | ICD-10-CM | POA: Diagnosis not present

## 2018-09-10 DIAGNOSIS — D0511 Intraductal carcinoma in situ of right breast: Secondary | ICD-10-CM | POA: Diagnosis not present

## 2018-09-10 DIAGNOSIS — Z803 Family history of malignant neoplasm of breast: Secondary | ICD-10-CM | POA: Diagnosis not present

## 2018-09-10 DIAGNOSIS — R921 Mammographic calcification found on diagnostic imaging of breast: Secondary | ICD-10-CM | POA: Diagnosis not present

## 2018-09-11 ENCOUNTER — Telehealth: Payer: Self-pay | Admitting: Oncology

## 2018-09-11 DIAGNOSIS — H0014 Chalazion left upper eyelid: Secondary | ICD-10-CM | POA: Diagnosis not present

## 2018-09-11 NOTE — Telephone Encounter (Signed)
Left message re 6/12 f/u. Also called 2nd number and spoke with patient confirming appointment. Patient also made aware appointments for next week cancelled.
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
## 2018-09-12 ENCOUNTER — Inpatient Hospital Stay: Payer: BC Managed Care – PPO | Attending: Oncology | Admitting: Oncology

## 2018-09-12 ENCOUNTER — Other Ambulatory Visit: Payer: Self-pay

## 2018-09-12 VITALS — BP 136/55 | HR 80 | Temp 98.2°F | Resp 20 | Ht 64.0 in | Wt 137.5 lb

## 2018-09-12 DIAGNOSIS — Z90710 Acquired absence of both cervix and uterus: Secondary | ICD-10-CM | POA: Diagnosis not present

## 2018-09-12 DIAGNOSIS — Z87891 Personal history of nicotine dependence: Secondary | ICD-10-CM | POA: Diagnosis not present

## 2018-09-12 DIAGNOSIS — Z853 Personal history of malignant neoplasm of breast: Secondary | ICD-10-CM

## 2018-09-12 DIAGNOSIS — Z79811 Long term (current) use of aromatase inhibitors: Secondary | ICD-10-CM | POA: Diagnosis not present

## 2018-09-12 DIAGNOSIS — Z90722 Acquired absence of ovaries, bilateral: Secondary | ICD-10-CM | POA: Insufficient documentation

## 2018-09-12 DIAGNOSIS — E785 Hyperlipidemia, unspecified: Secondary | ICD-10-CM | POA: Diagnosis not present

## 2018-09-12 DIAGNOSIS — Z8781 Personal history of (healed) traumatic fracture: Secondary | ICD-10-CM | POA: Diagnosis not present

## 2018-09-12 DIAGNOSIS — Z170 Estrogen receptor positive status [ER+]: Secondary | ICD-10-CM | POA: Diagnosis not present

## 2018-09-12 DIAGNOSIS — E039 Hypothyroidism, unspecified: Secondary | ICD-10-CM | POA: Diagnosis not present

## 2018-09-12 DIAGNOSIS — C50111 Malignant neoplasm of central portion of right female breast: Secondary | ICD-10-CM | POA: Insufficient documentation

## 2018-09-12 DIAGNOSIS — F419 Anxiety disorder, unspecified: Secondary | ICD-10-CM | POA: Insufficient documentation

## 2018-09-12 DIAGNOSIS — Z800 Family history of malignant neoplasm of digestive organs: Secondary | ICD-10-CM

## 2018-09-12 DIAGNOSIS — Z923 Personal history of irradiation: Secondary | ICD-10-CM | POA: Diagnosis not present

## 2018-09-12 DIAGNOSIS — D0511 Intraductal carcinoma in situ of right breast: Secondary | ICD-10-CM | POA: Insufficient documentation

## 2018-09-12 DIAGNOSIS — C50912 Malignant neoplasm of unspecified site of left female breast: Secondary | ICD-10-CM

## 2018-09-12 DIAGNOSIS — M810 Age-related osteoporosis without current pathological fracture: Secondary | ICD-10-CM | POA: Diagnosis not present

## 2018-09-12 DIAGNOSIS — Z1501 Genetic susceptibility to malignant neoplasm of breast: Secondary | ICD-10-CM | POA: Insufficient documentation

## 2018-09-12 DIAGNOSIS — Z79899 Other long term (current) drug therapy: Secondary | ICD-10-CM | POA: Diagnosis not present

## 2018-09-15 ENCOUNTER — Other Ambulatory Visit: Payer: BC Managed Care – PPO

## 2018-09-15 ENCOUNTER — Telehealth: Payer: Self-pay | Admitting: Radiation Oncology

## 2018-09-15 ENCOUNTER — Other Ambulatory Visit: Payer: Self-pay | Admitting: *Deleted

## 2018-09-15 DIAGNOSIS — D0511 Intraductal carcinoma in situ of right breast: Secondary | ICD-10-CM

## 2018-09-15 NOTE — Telephone Encounter (Signed)
New Message:     LVM for patient to return call to schedule from referral received to see Dr. Sondra Come

## 2018-09-16 ENCOUNTER — Telehealth: Payer: Self-pay | Admitting: Oncology

## 2018-09-16 NOTE — Telephone Encounter (Signed)
I left a message regarding schedule will mail 

## 2018-09-17 ENCOUNTER — Inpatient Hospital Stay: Payer: BC Managed Care – PPO | Admitting: Oncology

## 2018-09-22 ENCOUNTER — Ambulatory Visit: Payer: Self-pay | Admitting: General Surgery

## 2018-09-22 DIAGNOSIS — D225 Melanocytic nevi of trunk: Secondary | ICD-10-CM | POA: Diagnosis not present

## 2018-09-22 DIAGNOSIS — D0511 Intraductal carcinoma in situ of right breast: Secondary | ICD-10-CM

## 2018-09-22 DIAGNOSIS — L814 Other melanin hyperpigmentation: Secondary | ICD-10-CM | POA: Diagnosis not present

## 2018-09-22 DIAGNOSIS — L821 Other seborrheic keratosis: Secondary | ICD-10-CM | POA: Diagnosis not present

## 2018-09-23 ENCOUNTER — Other Ambulatory Visit: Payer: Self-pay

## 2018-09-23 ENCOUNTER — Encounter (HOSPITAL_BASED_OUTPATIENT_CLINIC_OR_DEPARTMENT_OTHER): Payer: Self-pay | Admitting: *Deleted

## 2018-09-23 NOTE — Progress Notes (Signed)
Location of Breast Cancer:  lower central portion of the right breast  Histology per Pathology Report: 09/10/18:  Diagnosis Breast, right, needle core biopsy, calcs 6 o'clock, 4cmfn - DUCTAL CARCINOMA IN SITU WITH CALCIFICATIONS.   Receptor Status: ER(50%), PR (10%),   Did patient present with symptoms (if so, please note symptoms) or was this found on screening mammography?: Recall that she is under intensified surveillance because of her BRCA positivity. She underwent a digital screening bilateral mammogram on 03/04/2018 showing: Breast Density Category C. There is no mammographic evidence of malignancy.    On 09/08/2018 she underwent bilateral breast MRI.  This showed a suspicious mass in the lower central portion of the right breast, measuring 0.9 cm.  The left breast was negative and there were no axillary findings of concern.  On 09/10/2018 she underwent biopsy of the right breast area in question and this showed ([id number]) ductal carcinoma in situ, high-grade, prognostic panel pending.  She is here to discuss those results.  Past/Anticipated interventions by surgeon, if any: awaiting definitive surgery  Past/Anticipated interventions by medical oncology, if any: Chemotherapy Per Dr. Jana Hakim 09/12/18:  Accordingly the overall plan is for surgery, followed by radiation.  We are stopping raloxifene now.  If the tumor is estrogen and progesterone receptor negative we will resume raloxifene which she is taking chiefly for osteoporosis management.  If the tumor is estrogen receptor positive however we will switch her to exemestane  Lymphedema issues, if any:  Pt has not undergone surgery at this time.  Pain issues, if any:  Pt denies c/o pain  SAFETY ISSUES:  Prior radiation? LEFT breast in 2009 under the direction of Dr. Sondra Come  Pacemaker/ICD? No  Possible current pregnancy? No  Is the patient on methotrexate? No  Current Complaints / other details:  Pt presents today for  consult with Dr. Sondra Come for Radiation Oncology.  BP 121/62 (BP Location: Left Arm, Patient Position: Sitting)   Pulse 63   Temp 98.3 F (36.8 C) (Oral)   Resp 18   Ht 5' 4"  (1.626 m)   Wt 137 lb 6.4 oz (62.3 kg)   SpO2 98%   BMI 23.58 kg/m   Wt Readings from Last 3 Encounters:  09/23/18 137 lb 9.1 oz (62.4 kg)  09/24/18 137 lb 6.4 oz (62.3 kg)  09/12/18 137 lb 8 oz (62.4 kg)       Loma Sousa, RN 09/24/2018,9:06 AM

## 2018-09-24 ENCOUNTER — Ambulatory Visit
Admission: RE | Admit: 2018-09-24 | Discharge: 2018-09-24 | Disposition: A | Discharge status: Home / Self Care | Payer: BC Managed Care – PPO | Source: Ambulatory Visit | Attending: Radiation Oncology | Admitting: Radiation Oncology

## 2018-09-24 ENCOUNTER — Encounter: Payer: Self-pay | Admitting: Radiation Oncology

## 2018-09-24 ENCOUNTER — Other Ambulatory Visit: Payer: Self-pay

## 2018-09-24 VITALS — BP 121/62 | HR 63 | Temp 98.3°F | Resp 18 | Ht 64.0 in | Wt 137.4 lb

## 2018-09-24 DIAGNOSIS — D0511 Intraductal carcinoma in situ of right breast: Secondary | ICD-10-CM

## 2018-09-24 DIAGNOSIS — Z853 Personal history of malignant neoplasm of breast: Secondary | ICD-10-CM | POA: Diagnosis not present

## 2018-09-24 DIAGNOSIS — Z923 Personal history of irradiation: Secondary | ICD-10-CM | POA: Diagnosis not present

## 2018-09-24 DIAGNOSIS — Z170 Estrogen receptor positive status [ER+]: Secondary | ICD-10-CM | POA: Diagnosis not present

## 2018-09-24 NOTE — Progress Notes (Signed)
Radiation Oncology         [phone] ________________________________  Initial Outpatient Consultation  Name: [name] MRN: [id number]  Date: 09/24/2018  DOB: [birth year]  ZJ:IRCVE, Christean Grief, MD  Magrinat, Virgie Dad, MD   REFERRING PHYSICIAN: Magrinat, Virgie Dad, MD  DIAGNOSIS: The encounter diagnosis was Ductal carcinoma in situ (DCIS) of right breast. Right breast, lower central portion, Ductal Carcinoma in Situ, ER+/PR+, high grade  HISTORY OF PRESENT ILLNESS::[name] is a 63 y.o. female who is presenting to the office today for evaluation of newly diagnosed breast cancer. She was seen in the clinic 11 years ago for left breast radiation. In 2008, she underwent a left lumpectomy which revealed intermediate grade invasive ductal carcinoma with micropapillary feature, measuring 2.1 cm with negative margins and benign lymph nodes. She returns now for right breast cancer consideration. She is under intensified surveillance because of her BRCA positivity. Per Dr. Jana Hakim 09/12/18:  Accordingly the overall plan is for surgery, followed by radiation. We are stopping raloxifene now. If the tumor is estrogen and progesterone receptor negative we will resume raloxifene which she is taking chiefly for osteoporosis management. If the tumor is estrogen receptor positive however we will switch her to exemestane.  She is scheduled to undergo right breast lumpectomy with seed on 10/02/18   She underwent bilateral breast MRI on 09/08/18. This showed a suspicious mass in the lower central portion of the right breast, measuring 0.9 cm. The left breast was negative and there were no axillary findings of concern.  She proceeded on 09/10/18 with biopsy of the right breast area in question and this showed ductal carcinoma in situ,high-grade. Prognostic indicators significant for: ER, 50% positive with moderate staining intensity and PR, 10% positive with strong staining intensity.  she reports  associated no nipple discharge or bleeding. she denies pain and any other symptoms.   She was found to have a BRCA2 mutation with her previous left breast cancer   PREVIOUS RADIATION THERAPY: Yes, in 2009 on left breast with Dr. Sondra Come, patient was found to have stage IIa disease and received radiation therapy as breast conserving treatment.  Treatments extending from May 22, 2007 through July 15, 2007.  The left breast received 50.4 Gray followed by a boost to the lumpectomy cavity for a cumulative dose of 63 Gy  PAST MEDICAL HISTORY:  has a past medical history of Anxiety, Anxiety disorder, BRCA2 positive (07/14/2012), Cancer ([place]) (2008), Dyslipidemia, Fall, Goiter, History of radiation therapy, Hypothyroidism, and Osteoporosis.    PAST SURGICAL HISTORY: Past Surgical History:  Procedure Laterality Date  . ABDOMINAL HYSTERECTOMY  2009   with BSO  . BREAST LUMPECTOMY  [id number], 1991  . COLONOSCOPY N/A 12/13/2015   Procedure: COLONOSCOPY;  Surgeon: [name], MD;  Location: [facility];  Service: Endoscopy;  Laterality: N/A;  . FRACTURE SURGERY  2015   left hip  . HARDWARE REMOVAL Left 07/08/2015   Procedure: LEFT HIP HARDWARE REMOVAL;  Surgeon: [name], MD;  Location: [facility];  Service: Orthopedics;  Laterality: Left;    FAMILY HISTORY: family history includes Cancer in her mother.  SOCIAL HISTORY:  reports that she has quit smoking. Her smoking use included cigarettes. She has never used smokeless tobacco. She reports current alcohol use. She reports that she does not use drugs.  ALLERGIES: Adhesive [tape], Chloraprep one step [chlorhexidine gluconate], Latex, Betadine [povidone iodine], Ampicillin, and Macrodantin [nitrofurantoin]  MEDICATIONS:  Current Outpatient Medications  Medication Sig Dispense Refill  . alendronate (  FOSAMAX) 70 MG tablet Take 70 mg by mouth once a week.     Marland Kitchen atorvastatin (LIPITOR) 10 MG tablet Take 5 mg by mouth daily.     .  Cholecalciferol (VITAMIN D3) 2000 UNITS TABS Take 1 tablet by mouth daily.     . clorazepate (TRANXENE) 7.5 MG tablet Take 15 mg by mouth at bedtime.     Marland Kitchen ibuprofen (ADVIL,MOTRIN) 200 MG tablet Take 400 mg by mouth every 6 (six) hours as needed for mild pain.    Marland Kitchen levothyroxine (SYNTHROID, LEVOTHROID) 125 MCG tablet Take 125 mcg by mouth daily.      . raloxifene (EVISTA) 60 MG tablet TAKE 1 TABLET BY MOUTH ONCE DAILY 90 tablet 4  . traZODone (DESYREL) 100 MG tablet Take 100 mg by mouth at bedtime.    . tretinoin (RETIN-A) 0.025 % cream Apply 1 application topically at bedtime.      No current facility-administered medications for this encounter.     REVIEW OF SYSTEMS:  A 10+ POINT REVIEW OF SYSTEMS WAS OBTAINED including neurology, dermatology, psychiatry, cardiac, respiratory, lymph, extremities, GI, GU, musculoskeletal, constitutional, reproductive, HEENT. All pertinent positives are noted in the HPI. All others are negative.    PHYSICAL EXAM:  height is 5' 4"  (1.626 m) and weight is 137 lb 6.4 oz (62.3 kg). Her oral temperature is 98.3 F (36.8 C). Her blood pressure is 121/62 and her pulse is 63. Her respiration is 18 and oxygen saturation is 98%.   General: Alert and oriented, in no acute distress HEENT: Head is normocephalic. Extraocular movements are intact. Oropharynx is clear. Neck: Neck is supple, no palpable cervical or supraclavicular lymphadenopathy. Heart: Regular in rate and rhythm with no murmurs, rubs, or gallops. Chest: Clear to auscultation bilaterally, with no rhonchi, wheezes, or rales. Abdomen: Soft, nontender, nondistended, with no rigidity or guarding. Extremities: No cyanosis or edema. Lymphatics: see Neck Exam Skin: No concerning lesions. Musculoskeletal: symmetric strength and muscle tone throughout. Neurologic: Cranial nerves II through XII are grossly intact. No obvious focalities. Speech is fluent. Coordination is intact. Psychiatric: Judgment and insight are  intact. Affect is appropriate. Left breast: Excellent cosmetic result.  Lumpectomy scar in the lateral aspect of the breast.  No palpable or visible signs of recurrence.  Patient has tattoos in place from her previous radiation therapy.  The right breast shows biopsy site in the lower outer quadrant.  No palpable mass nipple discharge or bleeding.  ECOG = 0    LABORATORY DATA:  Lab Results  Component Value Date   WBC 5.7 04/14/2018   HGB 12.8 04/14/2018   HCT 38.4 04/14/2018   MCV 86.1 04/14/2018   PLT 228 04/14/2018   NEUTROABS 3.2 04/14/2018   Lab Results  Component Value Date   NA 136 04/14/2018   K 4.3 04/14/2018   CL 105 04/14/2018   CO2 26 04/14/2018   GLUCOSE 103 (H) 04/14/2018   CREATININE 0.99 04/14/2018   CALCIUM 9.3 04/14/2018      RADIOGRAPHY: Dg Chest 2 View  Result Date: 09/05/2018 CLINICAL DATA:  Rib pain on the left side. History of breast cancer. EXAM: CHEST - 2 VIEW COMPARISON:  03/25/2017 and rib radiograph 09/05/2018 FINDINGS: Surgical clips in the left axilla. Lungs are clear. Negative for a pneumothorax. Old fractures involving the left first, second and third ribs. Probable old fracture involving the anterior left sixth rib. Heart and mediastinum are within normal limits. No pleural effusions. Multiple old right rib fractures. IMPRESSION: No active  cardiopulmonary disease. Old rib fractures. Electronically Signed   By: Markus Daft M.D.   On: 09/05/2018 09:39   Dg Ribs Unilateral Left  Result Date: 09/05/2018 CLINICAL DATA:  63 year old female with left lower rib injury 2 weeks ago with continued pain. EXAM: LEFT RIBS - 2 VIEW COMPARISON:  Two-view chest radiographs today reported separately. Chest radiographs 03/25/2017. Chest CT 11/02/2016. FINDINGS: Chronic left axillary lymph node dissection. Mediastinal contours appear stable and within normal limits. Visualized tracheal air column is within normal limits. Stable visible lung parenchyma. Left rib marker  placed at the anterior left 7/8 rib level. There is a nondisplaced fracture suspected at the anterior left 7th rib. Superimposed chronic anterior 1st and 2nd rib fractures. Questionable healing left lateral 6th rib fracture also. Elsewhere visible osseous structures appear intact. Negative visible bowel gas pattern. IMPRESSION: 1. Nondisplaced fractures suspected at the left 6th and 7th ribs. 2. No left lung pneumothorax or pleural effusion. Electronically Signed   By: Genevie Ann M.D.   On: 09/05/2018 09:39   Mr Breast Bilateral W Wo Contrast Inc Cad  Result Date: 09/09/2018 CLINICAL DATA:  Patient has a history of LEFT lumpectomy and radiation therapy for breast cancer diagnosed in 2008. The patient is BRCA-2 positive. Family history of breast cancer in her mother, diagnosed at age 71 and maternal grandmother, diagnosed at age 9. LABS:  Creatinine was obtained on site at [facility] at [address] Results: Creatinine 0.8 mg/dL.  GFR 78. EXAM: BILATERAL BREAST MRI WITH AND WITHOUT CONTRAST TECHNIQUE: Multiplanar, multisequence MR images of both breasts were obtained prior to and following the intravenous administration of 7 ml of Gadavist Three-dimensional MR images were rendered by post-processing of the original MR data on an independent workstation. The three-dimensional MR images were interpreted, and findings are reported in the following complete MRI report for this study. Three dimensional images were evaluated at the independent DynaCad workstation. COMPARISON:  MRI on 04/01/2017; mammogram from [facility] 03/04/2018 FINDINGS: Breast composition: c. Heterogeneous fibroglandular tissue. Background parenchymal enhancement: Mild Right breast: Within the LOWER central portion of the RIGHT breast there is a circumscribed oval mass demonstrating rapid persistent enhancement kinetics and measuring 0.6 x 0.7 x 0.9 centimeters. A stable oval intramammary lymph node is identified in the  LOWER OUTER QUADRANT of the RIGHT breast and is benign. No additional areas of concern are identified in the RIGHT breast. Left breast: Postoperative changes are identified in the LATERAL portion of the LEFT breast. No suspicious enhancement or other lesion in the LEFT breast. Lymph nodes: No abnormal appearing lymph nodes. Ancillary findings:  None. IMPRESSION: Suspicious mass in the LOWER central portion of the RIGHT breast measuring 0.9 centimeters. Postoperative changes in the LEFT breast. RECOMMENDATION: Recommend ultrasound guided core biopsy of RIGHT breast mass. If there is no sonographic correlate for the mass, MR guided core biopsy is recommended. BI-RADS CATEGORY  4: Suspicious. Electronically Signed   By: Nolon Nations M.D.   On: 09/09/2018 10:37      IMPRESSION: Right breast, lower central portion, Ductal Carcinoma in Situ, ER+/PR+, high grade.  Patient has met with Dr. Marlou Starks and options for management were discussed including bilateral mastectomies given the fact that she has a BRCA2 mutation.  Patient however is most interested in breast conservation therapy and would like to proceed with lumpectomy followed by adjuvant radiation therapy.  She will then continue with close surveillance with mammography and breast MRIs given her BRAC2 mutation. I discussed the  overall treatment course side effects and potential toxicities of radiation therapy with the patient.  She is well aware of the course of treatment given her previous left breast radiation therapy.  Patient may be a candidate for hypofractionated accelerated radiation therapy but this will depend on the potential overlap over her sternum in light of her previous breast radiation therapy.    PLAN: She is currently being scheduled for surgery with Dr. Marlou Starks. She will be seen in the postoperative setting approximately 2- 3 weeks later for further evaluation and planning for simulation and treatment which will begin approximately 5 to 6 weeks  postop.    ------------------------------------------------  Blair Promise, PhD, MD   This document serves as a record of services personally performed by Gery Pray, MD. It was created on his behalf by Mary-Margaret Loma Messing, a trained medical scribe. The creation of this record is based on the scribe's personal observations and the provider's statements to them. This document has been checked and approved by the attending provider.

## 2018-09-24 NOTE — Patient Instructions (Signed)
Coronavirus (COVID-19) Are you at risk?  Are you at risk for the Coronavirus (COVID-19)?  To be considered HIGH RISK for Coronavirus (COVID-19), you have to meet the following criteria:  . Traveled to [place], [place], [place], [place] or [place]; or in the [place] to [place], [place], [place], or [place]; and have fever, cough, and shortness of breath within the last 2 weeks of travel OR . Been in close contact with a person diagnosed with COVID-19 within the last 2 weeks and have fever, cough, and shortness of breath . IF YOU DO NOT MEET THESE CRITERIA, YOU ARE CONSIDERED LOW RISK FOR COVID-19.  What to do if you are HIGH RISK for COVID-19?  . If you are having a medical emergency, call 911. . Seek medical care right away. Before you go to a doctor's office, urgent care or emergency department, call ahead and tell them about your recent travel, contact with someone diagnosed with COVID-19, and your symptoms. You should receive instructions from your physician's office regarding next steps of care.  . When you arrive at healthcare provider, tell the healthcare staff immediately you have returned from visiting [place], [place], [place], [place] or [place]; or traveled in the [place] to [place], [place], [place], or [place]; in the last two weeks or you have been in close contact with a person diagnosed with COVID-19 in the last 2 weeks.   . Tell the health care staff about your symptoms: fever, cough and shortness of breath. . After you have been seen by a medical provider, you will be either: o Tested for (COVID-19) and discharged home on quarantine except to seek medical care if symptoms worsen, and asked to  - Stay home and avoid contact with others until you get your results (4-5 days)  - Avoid travel on public transportation if possible (such as bus, train, or airplane) or o Sent to the Emergency Department by EMS for evaluation, COVID-19 testing, and possible  admission depending on your condition and test results.  What to do if you are LOW RISK for COVID-19?  Reduce your risk of any infection by using the same precautions used for avoiding the common cold or flu:  . Wash your hands often with soap and warm water for at least 20 seconds.  If soap and water are not readily available, use an alcohol-based hand sanitizer with at least 60% alcohol.  . If coughing or sneezing, cover your mouth and nose by coughing or sneezing into the elbow areas of your shirt or coat, into a tissue or into your sleeve (not your hands). . Avoid shaking hands with others and consider head nods or verbal greetings only. . Avoid touching your eyes, nose, or mouth with unwashed hands.  . Avoid close contact with people who are sick. . Avoid places or events with large numbers of people in one location, like concerts or sporting events. . Carefully consider travel plans you have or are making. . If you are planning any travel outside or inside the [place], visit the CDC's Travelers' Health webpage for the latest health notices. . If you have some symptoms but not all symptoms, continue to monitor at home and seek medical attention if your symptoms worsen. . If you are having a medical emergency, call 911.   ADDITIONAL HEALTHCARE OPTIONS FOR PATIENTS  [facility] / e-Visit: [email or web address]         [facility]: [phone]  [facility]: [phone]                   [facility]: [phone]   

## 2018-09-29 ENCOUNTER — Other Ambulatory Visit (HOSPITAL_COMMUNITY)
Admission: RE | Admit: 2018-09-29 | Discharge: 2018-09-29 | Disposition: A | Discharge status: Home / Self Care | Payer: BC Managed Care – PPO | Source: Ambulatory Visit | Attending: General Surgery | Admitting: General Surgery

## 2018-09-29 DIAGNOSIS — Z1159 Encounter for screening for other viral diseases: Secondary | ICD-10-CM | POA: Insufficient documentation

## 2018-09-29 DIAGNOSIS — Z01812 Encounter for preprocedural laboratory examination: Secondary | ICD-10-CM | POA: Insufficient documentation

## 2018-09-29 LAB — SARS CORONAVIRUS 2 (TAT 6-24 HRS): SARS Coronavirus 2: NEGATIVE

## 2018-10-01 DIAGNOSIS — D0511 Intraductal carcinoma in situ of right breast: Secondary | ICD-10-CM | POA: Diagnosis not present

## 2018-10-01 NOTE — Pre-Procedure Instructions (Signed)
Gave patient pre-surgery drink with instructions to finish by 8:30am morning of surgery. Confirmed arrival time of 10:30 DOS.

## 2018-10-02 ENCOUNTER — Encounter (HOSPITAL_BASED_OUTPATIENT_CLINIC_OR_DEPARTMENT_OTHER)
Admission: RE | Disposition: A | Discharge status: Home / Self Care | Payer: Self-pay | Source: Home / Self Care | Attending: General Surgery

## 2018-10-02 ENCOUNTER — Encounter (HOSPITAL_BASED_OUTPATIENT_CLINIC_OR_DEPARTMENT_OTHER): Payer: Self-pay | Admitting: *Deleted

## 2018-10-02 ENCOUNTER — Ambulatory Visit (HOSPITAL_BASED_OUTPATIENT_CLINIC_OR_DEPARTMENT_OTHER)
Admission: RE | Admit: 2018-10-02 | Discharge: 2018-10-02 | Disposition: A | Discharge status: Home / Self Care | Payer: BC Managed Care – PPO | Attending: General Surgery | Admitting: General Surgery

## 2018-10-02 ENCOUNTER — Other Ambulatory Visit: Payer: Self-pay

## 2018-10-02 ENCOUNTER — Ambulatory Visit (HOSPITAL_BASED_OUTPATIENT_CLINIC_OR_DEPARTMENT_OTHER): Payer: BC Managed Care – PPO | Admitting: Certified Registered"

## 2018-10-02 DIAGNOSIS — Z880 Allergy status to penicillin: Secondary | ICD-10-CM | POA: Diagnosis not present

## 2018-10-02 DIAGNOSIS — Z888 Allergy status to other drugs, medicaments and biological substances status: Secondary | ICD-10-CM | POA: Diagnosis not present

## 2018-10-02 DIAGNOSIS — Z8349 Family history of other endocrine, nutritional and metabolic diseases: Secondary | ICD-10-CM | POA: Diagnosis not present

## 2018-10-02 DIAGNOSIS — Z90710 Acquired absence of both cervix and uterus: Secondary | ICD-10-CM | POA: Insufficient documentation

## 2018-10-02 DIAGNOSIS — Z170 Estrogen receptor positive status [ER+]: Secondary | ICD-10-CM | POA: Diagnosis not present

## 2018-10-02 DIAGNOSIS — D0511 Intraductal carcinoma in situ of right breast: Secondary | ICD-10-CM | POA: Diagnosis not present

## 2018-10-02 DIAGNOSIS — Z79899 Other long term (current) drug therapy: Secondary | ICD-10-CM | POA: Diagnosis not present

## 2018-10-02 DIAGNOSIS — Z91040 Latex allergy status: Secondary | ICD-10-CM | POA: Insufficient documentation

## 2018-10-02 DIAGNOSIS — Z881 Allergy status to other antibiotic agents status: Secondary | ICD-10-CM | POA: Diagnosis not present

## 2018-10-02 DIAGNOSIS — Z1501 Genetic susceptibility to malignant neoplasm of breast: Secondary | ICD-10-CM | POA: Insufficient documentation

## 2018-10-02 DIAGNOSIS — Z853 Personal history of malignant neoplasm of breast: Secondary | ICD-10-CM | POA: Insufficient documentation

## 2018-10-02 DIAGNOSIS — S2249XA Multiple fractures of ribs, unspecified side, initial encounter for closed fracture: Secondary | ICD-10-CM | POA: Diagnosis not present

## 2018-10-02 DIAGNOSIS — C50911 Malignant neoplasm of unspecified site of right female breast: Secondary | ICD-10-CM | POA: Diagnosis not present

## 2018-10-02 DIAGNOSIS — Z8371 Family history of colonic polyps: Secondary | ICD-10-CM | POA: Diagnosis not present

## 2018-10-02 DIAGNOSIS — Z8249 Family history of ischemic heart disease and other diseases of the circulatory system: Secondary | ICD-10-CM | POA: Diagnosis not present

## 2018-10-02 DIAGNOSIS — Z87891 Personal history of nicotine dependence: Secondary | ICD-10-CM | POA: Insufficient documentation

## 2018-10-02 DIAGNOSIS — F419 Anxiety disorder, unspecified: Secondary | ICD-10-CM | POA: Insufficient documentation

## 2018-10-02 DIAGNOSIS — Z803 Family history of malignant neoplasm of breast: Secondary | ICD-10-CM | POA: Diagnosis not present

## 2018-10-02 DIAGNOSIS — Z923 Personal history of irradiation: Secondary | ICD-10-CM | POA: Insufficient documentation

## 2018-10-02 DIAGNOSIS — E039 Hypothyroidism, unspecified: Secondary | ICD-10-CM | POA: Diagnosis not present

## 2018-10-02 HISTORY — PX: BREAST LUMPECTOMY WITH RADIOACTIVE SEED LOCALIZATION: SHX6424

## 2018-10-02 SURGERY — BREAST LUMPECTOMY WITH RADIOACTIVE SEED LOCALIZATION
Anesthesia: General | Site: Breast | Laterality: Right

## 2018-10-02 MED ORDER — VANCOMYCIN HCL IN DEXTROSE 1-5 GM/200ML-% IV SOLN
INTRAVENOUS | Status: AC
  Filled 2018-10-02: qty 200

## 2018-10-02 MED ORDER — MIDAZOLAM HCL 2 MG/2ML IJ SOLN
1.0000 mg | INTRAMUSCULAR | Status: DC | PRN
  Administered 2018-10-02: 2 mg via INTRAVENOUS

## 2018-10-02 MED ORDER — GABAPENTIN 300 MG PO CAPS
ORAL_CAPSULE | ORAL | Status: AC
  Filled 2018-10-02: qty 1

## 2018-10-02 MED ORDER — CELECOXIB 200 MG PO CAPS: 200.0000 mg | ORAL_CAPSULE | ORAL | Status: DC

## 2018-10-02 MED ORDER — ACETAMINOPHEN 500 MG PO TABS
ORAL_TABLET | ORAL | Status: AC
  Filled 2018-10-02: qty 2

## 2018-10-02 MED ORDER — VANCOMYCIN HCL IN DEXTROSE 1-5 GM/200ML-% IV SOLN
1000.0000 mg | INTRAVENOUS | Status: AC
  Administered 2018-10-02 (×2): 1000 mg via INTRAVENOUS

## 2018-10-02 MED ORDER — LIDOCAINE HCL (CARDIAC) PF 100 MG/5ML IV SOSY
PREFILLED_SYRINGE | INTRAVENOUS | Status: DC | PRN
  Administered 2018-10-02: 100 mg via INTRAVENOUS

## 2018-10-02 MED ORDER — METOCLOPRAMIDE HCL 5 MG/ML IJ SOLN: 10.0000 mg | Freq: Once | INTRAMUSCULAR | Status: DC | PRN

## 2018-10-02 MED ORDER — EPHEDRINE SULFATE 50 MG/ML IJ SOLN
INTRAMUSCULAR | Status: DC | PRN
  Administered 2018-10-02: 15 mg via INTRAVENOUS

## 2018-10-02 MED ORDER — LIDOCAINE 2% (20 MG/ML) 5 ML SYRINGE
INTRAMUSCULAR | Status: AC
  Filled 2018-10-02: qty 5

## 2018-10-02 MED ORDER — FENTANYL CITRATE (PF) 100 MCG/2ML IJ SOLN: 25.0000 ug | INTRAMUSCULAR | Status: DC | PRN

## 2018-10-02 MED ORDER — DEXAMETHASONE SODIUM PHOSPHATE 10 MG/ML IJ SOLN
INTRAMUSCULAR | Status: AC
  Filled 2018-10-02: qty 1

## 2018-10-02 MED ORDER — LACTATED RINGERS IV SOLN
INTRAVENOUS | Status: DC
  Administered 2018-10-02: 12:00:00 via INTRAVENOUS

## 2018-10-02 MED ORDER — 0.9 % SODIUM CHLORIDE (POUR BTL) OPTIME
TOPICAL | Status: DC | PRN
  Administered 2018-10-02: 200 mL

## 2018-10-02 MED ORDER — MEPERIDINE HCL 25 MG/ML IJ SOLN: 6.2500 mg | INTRAMUSCULAR | Status: DC | PRN

## 2018-10-02 MED ORDER — SCOPOLAMINE 1 MG/3DAYS TD PT72: 1.0000 | MEDICATED_PATCH | Freq: Once | TRANSDERMAL | Status: DC

## 2018-10-02 MED ORDER — BUPIVACAINE-EPINEPHRINE (PF) 0.25% -1:200000 IJ SOLN
INTRAMUSCULAR | Status: DC | PRN
  Administered 2018-10-02: 10 mL

## 2018-10-02 MED ORDER — FENTANYL CITRATE (PF) 100 MCG/2ML IJ SOLN
INTRAMUSCULAR | Status: AC
  Filled 2018-10-02: qty 2

## 2018-10-02 MED ORDER — PROPOFOL 10 MG/ML IV BOLUS
INTRAVENOUS | Status: DC | PRN
  Administered 2018-10-02: 180 mg via INTRAVENOUS

## 2018-10-02 MED ORDER — ONDANSETRON HCL 4 MG/2ML IJ SOLN
INTRAMUSCULAR | Status: AC
  Filled 2018-10-02: qty 2

## 2018-10-02 MED ORDER — MIDAZOLAM HCL 2 MG/2ML IJ SOLN
INTRAMUSCULAR | Status: AC
  Filled 2018-10-02: qty 2

## 2018-10-02 MED ORDER — GABAPENTIN 300 MG PO CAPS
300.0000 mg | ORAL_CAPSULE | ORAL | Status: AC
  Administered 2018-10-02: 300 mg via ORAL

## 2018-10-02 MED ORDER — DEXAMETHASONE SODIUM PHOSPHATE 10 MG/ML IJ SOLN
INTRAMUSCULAR | Status: DC | PRN
  Administered 2018-10-02: 10 mg via INTRAVENOUS

## 2018-10-02 MED ORDER — ACETAMINOPHEN 500 MG PO TABS
1000.0000 mg | ORAL_TABLET | ORAL | Status: AC
  Administered 2018-10-02: 1000 mg via ORAL

## 2018-10-02 MED ORDER — HYDROCODONE-ACETAMINOPHEN 5-325 MG PO TABS: 1.0000 | ORAL_TABLET | Freq: Four times a day (QID) | ORAL | 0 refills | Status: DC | PRN

## 2018-10-02 MED ORDER — FENTANYL CITRATE (PF) 100 MCG/2ML IJ SOLN
50.0000 ug | INTRAMUSCULAR | Status: AC | PRN
  Administered 2018-10-02: 25 ug via INTRAVENOUS
  Administered 2018-10-02: 50 ug via INTRAVENOUS
  Administered 2018-10-02: 25 ug via INTRAVENOUS

## 2018-10-02 MED ORDER — PROPOFOL 10 MG/ML IV BOLUS
INTRAVENOUS | Status: AC
  Filled 2018-10-02: qty 40

## 2018-10-02 MED ORDER — LACTATED RINGERS IV SOLN: INTRAVENOUS | Status: DC

## 2018-10-02 SURGICAL SUPPLY — 47 items
ADH SKN CLS APL DERMABOND .7 (GAUZE/BANDAGES/DRESSINGS) ×1
APL PRP STRL LF DISP 70% ISPRP (MISCELLANEOUS) ×1
APPLIER CLIP 9.375 MED OPEN (MISCELLANEOUS)
APR CLP MED 9.3 20 MLT OPN (MISCELLANEOUS)
BLADE SURG 15 STRL LF DISP TIS (BLADE) ×1 IMPLANT
BLADE SURG 15 STRL SS (BLADE) ×2
CANISTER SUC SOCK COL 7IN (MISCELLANEOUS) ×2 IMPLANT
CANISTER SUCT 1200ML W/VALVE (MISCELLANEOUS) ×2 IMPLANT
CHLORAPREP W/TINT 26 (MISCELLANEOUS) ×2 IMPLANT
CLIP APPLIE 9.375 MED OPEN (MISCELLANEOUS) IMPLANT
COVER BACK TABLE REUSABLE LG (DRAPES) ×2 IMPLANT
COVER MAYO STAND REUSABLE (DRAPES) ×2 IMPLANT
COVER PROBE W GEL 5X96 (DRAPES) ×2 IMPLANT
COVER WAND RF STERILE (DRAPES) IMPLANT
DECANTER SPIKE VIAL GLASS SM (MISCELLANEOUS) IMPLANT
DERMABOND ADVANCED (GAUZE/BANDAGES/DRESSINGS) ×1
DERMABOND ADVANCED .7 DNX12 (GAUZE/BANDAGES/DRESSINGS) ×1 IMPLANT
DRAPE LAPAROSCOPIC ABDOMINAL (DRAPES) ×2 IMPLANT
DRAPE UTILITY XL STRL (DRAPES) ×2 IMPLANT
ELECT COATED BLADE 2.86 ST (ELECTRODE) ×3 IMPLANT
ELECT REM PT RETURN 9FT ADLT (ELECTROSURGICAL) ×2
ELECTRODE REM PT RTRN 9FT ADLT (ELECTROSURGICAL) ×1 IMPLANT
GLOVE BIO SURGEON STRL SZ7.5 (GLOVE) ×4 IMPLANT
GLOVE BIOGEL PI IND STRL 6.5 (GLOVE) IMPLANT
GLOVE BIOGEL PI INDICATOR 6.5 (GLOVE) ×2
GLOVE SURG SS PI 6.5 STRL IVOR (GLOVE) ×1 IMPLANT
GLOVE SURG SS PI 7.5 STRL IVOR (GLOVE) ×2 IMPLANT
GOWN STRL REUS W/ TWL LRG LVL3 (GOWN DISPOSABLE) ×2 IMPLANT
GOWN STRL REUS W/TWL LRG LVL3 (GOWN DISPOSABLE) ×4
ILLUMINATOR WAVEGUIDE N/F (MISCELLANEOUS) IMPLANT
KIT MARKER MARGIN INK (KITS) ×2 IMPLANT
LIGHT WAVEGUIDE WIDE FLAT (MISCELLANEOUS) IMPLANT
NDL HYPO 25X1 1.5 SAFETY (NEEDLE) IMPLANT
NEEDLE HYPO 25X1 1.5 SAFETY (NEEDLE) IMPLANT
NS IRRIG 1000ML POUR BTL (IV SOLUTION) IMPLANT
PACK BASIN DAY SURGERY FS (CUSTOM PROCEDURE TRAY) ×2 IMPLANT
PENCIL BUTTON HOLSTER BLD 10FT (ELECTRODE) ×3 IMPLANT
SLEEVE SCD COMPRESS KNEE MED (MISCELLANEOUS) ×2 IMPLANT
SPONGE LAP 18X18 RF (DISPOSABLE) ×2 IMPLANT
SUT MON AB 4-0 PC3 18 (SUTURE) IMPLANT
SUT SILK 2 0 SH (SUTURE) IMPLANT
SUT VICRYL 3-0 CR8 SH (SUTURE) ×2 IMPLANT
SYR CONTROL 10ML LL (SYRINGE) IMPLANT
TOWEL GREEN STERILE FF (TOWEL DISPOSABLE) ×2 IMPLANT
TRAY FAXITRON CT DISP (TRAY / TRAY PROCEDURE) ×2 IMPLANT
TUBE CONNECTING 20X1/4 (TUBING) ×2 IMPLANT
YANKAUER SUCT BULB TIP NO VENT (SUCTIONS) IMPLANT

## 2018-10-02 NOTE — Op Note (Signed)
10/02/2018  1:53 PM  PATIENT:  [name]  63 y.o. female  PRE-OPERATIVE DIAGNOSIS:  RIGHT BREAST DCIS  POST-OPERATIVE DIAGNOSIS:  RIGHT BREAST DCIS  PROCEDURE:  Procedure(s): RIGHT BREAST LUMPECTOMY WITH RADIOACTIVE SEED LOCALIZATION (Right)  SURGEON:  Surgeon(s) and Role:    * Jovita Kussmaul, MD - Primary  PHYSICIAN ASSISTANT:   ASSISTANTS: none   ANESTHESIA:   local and general  EBL:  minimal   BLOOD ADMINISTERED:none  DRAINS: none   LOCAL MEDICATIONS USED:  MARCAINE     SPECIMEN:  Source of Specimen:  right breast tissue  DISPOSITION OF SPECIMEN:  PATHOLOGY  COUNTS:  YES  TOURNIQUET:  * No tourniquets in log *  DICTATION: .Dragon Dictation   After informed consent was obtained the patient was brought to the operating room and placed in the supine position on the operating table.  After adequate induction of general anesthesia the patient's right breast was prepped with ChloraPrep, allowed to dry, and draped in usual sterile manner.  An appropriate timeout was performed.  Previously an I-125 seed was placed in the lower outer aspect of the right breast to mark an area of ductal carcinoma in situ.  The neoprobe was set to I-125 in the area of radioactivity was readily identified in the lower central right breast.  The area around this was infiltrated with quarter percent Marcaine.  A curvilinear incision was made along the inferior edge of the areola.  The incision was carried through the skin and subcutaneous tissue sharply with the electrocautery.  Next the dissection was carried inferiorly both medially and laterally between the breast tissue and the subcutaneous fatty tissue.  Once the lower portion of the breast tissue was mobilized away from the skin then a circular portion of breast tissue was excised sharply around the radioactive seed while checking the area of radioactivity frequently.  Once the specimen was removed it was oriented with the appropriate paint  colors.  A specimen radiograph was obtained that showed the clip and seed to be near the center of the specimen.  The specimen was then sent to pathology for further evaluation.  Hemostasis was achieved using the Bovie electrocautery.  The wound was irrigated with copious amounts of saline.  The cavity was marked with clips.  The deep layer of the wound was then closed with layers of interrupted 3-0 Vicryl stitches.  The skin was then closed with interrupted 4-0 Monocryl subcuticular stitches.  Dermabond dressings were applied.  The patient tolerated the procedure well.  At the end of the case all needle sponge and instrument counts were correct.  The patient was then awakened and taken recovery in stable condition.  PLAN OF CARE: Discharge to home after PACU  PATIENT DISPOSITION:  PACU - hemodynamically stable.   Delay start of Pharmacological VTE agent (>24hrs) due to surgical blood loss or risk of bleeding: not applicable

## 2018-10-02 NOTE — Anesthesia Procedure Notes (Signed)
Procedure Name: LMA Insertion Performed by: Carle Fenech M, CRNA Pre-anesthesia Checklist: Patient identified, Emergency Drugs available, Suction available, Patient being monitored and Timeout performed Patient Re-evaluated:Patient Re-evaluated prior to induction Oxygen Delivery Method: Circle system utilized Preoxygenation: Pre-oxygenation with 100% oxygen Induction Type: IV induction LMA: LMA inserted LMA Size: 4.0 Tube type: Oral Number of attempts: 1 Placement Confirmation: positive ETCO2,  CO2 detector and breath sounds checked- equal and bilateral Tube secured with: Tape Dental Injury: Teeth and Oropharynx as per pre-operative assessment        

## 2018-10-02 NOTE — Anesthesia Preprocedure Evaluation (Signed)
Anesthesia Evaluation  Patient identified by MRN, date of birth, ID band Patient awake    Reviewed: Allergy & Precautions, NPO status , Patient's Chart, lab work & pertinent test results  Airway Mallampati: II  TM Distance: >3 FB Neck ROM: Full    Dental no notable dental hx.    Pulmonary neg pulmonary ROS, former smoker,    Pulmonary exam normal breath sounds clear to auscultation       Cardiovascular negative cardio ROS Normal cardiovascular exam Rhythm:Regular Rate:Normal     Neuro/Psych negative neurological ROS  negative psych ROS   GI/Hepatic negative GI ROS, Neg liver ROS,   Endo/Other  negative endocrine ROS  Renal/GU negative Renal ROS  negative genitourinary   Musculoskeletal negative musculoskeletal ROS (+)   Abdominal   Peds negative pediatric ROS (+)  Hematology negative hematology ROS (+)   Anesthesia Other Findings   Reproductive/Obstetrics negative OB ROS                             Anesthesia Physical Anesthesia Plan  ASA: II  Anesthesia Plan: General   Post-op Pain Management:    Induction: Intravenous  PONV Risk Score and Plan: 3 and Ondansetron, Dexamethasone, Midazolam and Treatment may vary due to age or medical condition  Airway Management Planned: LMA  Additional Equipment:   Intra-op Plan:   Post-operative Plan:   Informed Consent: I have reviewed the patients History and Physical, chart, labs and discussed the procedure including the risks, benefits and alternatives for the proposed anesthesia with the patient or authorized representative who has indicated his/her understanding and acceptance.     Dental advisory given  Plan Discussed with: CRNA  Anesthesia Plan Comments:         Anesthesia Quick Evaluation

## 2018-10-02 NOTE — Discharge Instructions (Signed)

## 2018-10-02 NOTE — H&P (Signed)
[name]  Location: [facility] Surgery Patient #: [id number] DOB: 1956/01/05 Married / Language: English / Race: White Female   History of Present Illness  The patient is a 63 year old female who presents with breast cancer. We are asked to see the patient in consultation by Dr. Jana Hakim to evaluate her for ductal carcinoma in situ of the right breast. The patient is a 63 year old white female who presents with a screen detected 9 mm mass in the lower central right breast. This was biopsied and came back as high-grade ductal carcinoma in situ. She had no abnormal appearing lymph nodes. She does have a history of left breast cancer which was treated with lumpectomy and radiation and anti-estrogens 11 years ago. She states that she is BRCA2 positive but she is not ready for bilateral mastectomies. She is otherwise in good health. She has no other complaints today.   Past Surgical History  Breast Biopsy  Bilateral. Breast Mass; Local Excision  Left. Hysterectomy (not due to cancer) - Complete  Sentinel Lymph Node Biopsy   Diagnostic Studies History  Colonoscopy  1-5 years ago Mammogram  within last year  Allergies  Ampicillin *PENICILLINS*  Macrodantin *URINARY ANTI-INFECTIVES*  Latex Gloves *MEDICAL DEVICES AND SUPPLIES*  ChloraPrep One Step *ANTISEPTICS & DISINFECTANTS*   Medication History Levothyroxine Sodium (125MCG Tablet, Oral) Active. traZODone HCl (100MG Tablet, Oral) Active. Atorvastatin Calcium (10MG Tablet, Oral) Active. Raloxifene HCl (60MG Tablet, Oral) Active. Medications Reconciled  Social History  Alcohol use  Moderate alcohol use. Caffeine use  Coffee. No drug use  Tobacco use  Former smoker.  Family History  Breast Cancer  Mother. Cervical Cancer  Mother. Colon Polyps  Father. Heart Disease  Father.  Pregnancy / Birth History  Age at menarche  32 years. Age of menopause  52-50 Contraceptive History  Oral  contraceptives. Gravida  2 Length (months) of breastfeeding  7-12 Maternal age  42-30 Para  2  Other Problems Anxiety Disorder  Breast Cancer  Lump In Breast  Thyroid Disease     Review of Systems  General Not Present- Appetite Loss, Chills, Fatigue, Fever, Night Sweats, Weight Gain and Weight Loss. Skin Not Present- Change in Wart/Mole, Dryness, Hives, Jaundice, New Lesions, Non-Healing Wounds, Rash and Ulcer. HEENT Present- Wears glasses/contact lenses. Not Present- Earache, Hearing Loss, Hoarseness, Nose Bleed, Oral Ulcers, Ringing in the Ears, Seasonal Allergies, Sinus Pain, Sore Throat, Visual Disturbances and Yellow Eyes. Respiratory Not Present- Bloody sputum, Chronic Cough, Difficulty Breathing, Snoring and Wheezing. Breast Present- Breast Mass. Not Present- Breast Pain, Nipple Discharge and Skin Changes. Cardiovascular Not Present- Chest Pain, Difficulty Breathing Lying Down, Leg Cramps, Palpitations, Rapid Heart Rate, Shortness of Breath and Swelling of Extremities. Gastrointestinal Not Present- Abdominal Pain, Bloating, Bloody Stool, Change in Bowel Habits, Chronic diarrhea, Constipation, Difficulty Swallowing, Excessive gas, Gets full quickly at meals, Hemorrhoids, Indigestion, Nausea, Rectal Pain and Vomiting. Female Genitourinary Not Present- Frequency, Nocturia, Painful Urination, Pelvic Pain and Urgency. Musculoskeletal Not Present- Back Pain, Joint Pain, Joint Stiffness, Muscle Pain, Muscle Weakness and Swelling of Extremities. Neurological Not Present- Decreased Memory, Fainting, Headaches, Numbness, Seizures, Tingling, Tremor, Trouble walking and Weakness. Psychiatric Present- Anxiety. Not Present- Bipolar, Change in Sleep Pattern, Depression, Fearful and Frequent crying. Endocrine Not Present- Cold Intolerance, Excessive Hunger, Hair Changes, Heat Intolerance, Hot flashes and New Diabetes. Hematology Not Present- Blood Thinners, Easy Bruising, Excessive  bleeding, Gland problems, HIV and Persistent Infections.  Vitals  Weight: 139.5 lb Height: 64in Body Surface Area: 1.68 m  Body Mass Index: 23.94 kg/m  Pulse: 75 (Regular)  BP: 136/80(Sitting, Left Arm, Standard)       Physical Exam  General Mental Status-Alert. General Appearance-Consistent with stated age. Hydration-Well hydrated. Voice-Normal.  Head and Neck Head-normocephalic, atraumatic with no lesions or palpable masses. Trachea-midline. Thyroid Gland Characteristics - normal size and consistency.  Eye Eyeball - Bilateral-Extraocular movements intact. Sclera/Conjunctiva - Bilateral-No scleral icterus.  Chest and Lung Exam Chest and lung exam reveals -quiet, even and easy respiratory effort with no use of accessory muscles and on auscultation, normal breath sounds, no adventitious sounds and normal vocal resonance. Inspection Chest Wall - Normal. Back - normal.  Breast Note: There is no palpable mass in either breast. There is no palpable axillary, supraclavicular, or cervical lymphadenopathy. Her left lateral breast incisions from her previous cancer have healed well.   Cardiovascular Cardiovascular examination reveals -normal heart sounds, regular rate and rhythm with no murmurs and normal pedal pulses bilaterally.  Abdomen Inspection Inspection of the abdomen reveals - No Hernias. Skin - Scar - no surgical scars. Palpation/Percussion Palpation and Percussion of the abdomen reveal - Soft, Non Tender, No Rebound tenderness, No Rigidity (guarding) and No hepatosplenomegaly. Auscultation Auscultation of the abdomen reveals - Bowel sounds normal.  Neurologic Neurologic evaluation reveals -alert and oriented x 3 with no impairment of recent or remote memory. Mental Status-Normal.  Musculoskeletal Normal Exam - Left-Upper Extremity Strength Normal and Lower Extremity Strength Normal. Normal Exam - Right-Upper Extremity  Strength Normal and Lower Extremity Strength Normal.  Lymphatic Head & Neck  General Head & Neck Lymphatics: Bilateral - Description - Normal. Axillary  General Axillary Region: Bilateral - Description - Normal. Tenderness - Non Tender. Femoral & Inguinal  Generalized Femoral & Inguinal Lymphatics: Bilateral - Description - Normal. Tenderness - Non Tender.    Assessment & Plan   DUCTAL CARCINOMA IN SITU (DCIS) OF RIGHT BREAST (D05.11) Impression: The patient appears to have a 9 mm area of ductal carcinoma in situ in the lower central right breast. She is also BRCA2 positive. I have talked to her in detail about the different options for treatment and about her risk of breast cancer and at this point she favors breast conservation. I think this is a reasonable way of dealing with her cancer. I have discussed with her in detail the risks and benefits of the operation as well as some of the technical aspects and she understands and wishes to proceed. I will plan for a right breast radioactive seed localized lumpectomy. She will not need a node evaluation. She will continue to be monitored very closely because of her BRCA2 positivity.  Current Plans Referred to Oncology, for evaluation and follow up (Oncology). Routine. Pt Education - Breast Cancer: discussed with patient and provided information.

## 2018-10-02 NOTE — Transfer of Care (Signed)
Immediate Anesthesia Transfer of Care Note  Patient: [name]  Procedure(s) Performed: RIGHT BREAST LUMPECTOMY WITH RADIOACTIVE SEED LOCALIZATION (Right Breast)  Patient Location: PACU  Anesthesia Type:General  Level of Consciousness: awake, alert  and oriented  Airway & Oxygen Therapy: Patient Spontanous Breathing and Patient connected to face mask oxygen  Post-op Assessment: Report given to RN and Post -op Vital signs reviewed and stable  Post vital signs: Reviewed and stable  Last Vitals:  Vitals Value Taken Time  BP    Temp    Pulse 81 10/02/18 1352  Resp 17 10/02/18 1352  SpO2 99 % 10/02/18 1352  Vitals shown include unvalidated device data.  Last Pain:  Vitals:   10/02/18 1220  TempSrc: Oral  PainSc:          Complications: No apparent anesthesia complications

## 2018-10-02 NOTE — Anesthesia Postprocedure Evaluation (Signed)
Anesthesia Post Note  Patient: [name]  Procedure(s) Performed: RIGHT BREAST LUMPECTOMY WITH RADIOACTIVE SEED LOCALIZATION (Right Breast)     Patient location during evaluation: PACU Anesthesia Type: General Level of consciousness: awake and alert Pain management: pain level controlled Vital Signs Assessment: post-procedure vital signs reviewed and stable Respiratory status: spontaneous breathing, nonlabored ventilation, respiratory function stable and patient connected to nasal cannula oxygen Cardiovascular status: blood pressure returned to baseline and stable Postop Assessment: no apparent nausea or vomiting Anesthetic complications: no    Last Vitals:  Vitals:   10/02/18 1415 10/02/18 1445  BP: 131/70 (!) 148/60  Pulse: 74 63  Resp: 19 18  Temp:  36.7 C  SpO2: 100% 99%    Last Pain:  Vitals:   10/02/18 1445  TempSrc:   PainSc: 0-No pain                 Montez Hageman

## 2018-10-02 NOTE — Interval H&P Note (Signed)
History and Physical Interval Note:  10/02/2018 12:32 PM  [name]  has presented today for surgery, with the diagnosis of RIGHT BREAST DCIS.  The various methods of treatment have been discussed with the patient and family. After consideration of risks, benefits and other options for treatment, the patient has consented to  Procedure(s): RIGHT BREAST LUMPECTOMY WITH RADIOACTIVE SEED LOCALIZATION (Right) as a surgical intervention.  The patient's history has been reviewed, patient examined, no change in status, stable for surgery.  I have reviewed the patient's chart and labs.  Questions were answered to the patient's satisfaction.     Autumn Messing III

## 2018-10-06 ENCOUNTER — Encounter (HOSPITAL_BASED_OUTPATIENT_CLINIC_OR_DEPARTMENT_OTHER): Payer: Self-pay | Admitting: General Surgery

## 2018-10-07 ENCOUNTER — Other Ambulatory Visit: Payer: Self-pay | Admitting: Oncology

## 2018-10-07 ENCOUNTER — Encounter: Payer: Self-pay | Admitting: Oncology

## 2018-10-07 NOTE — Progress Notes (Addendum)
[name]  [phone] [phone]     ID: [name]   DOB: January 27, 1956  MR#: [id number]  FTD#:[id number]  Patient Care Team: Levin Erp, MD as PCP - General (Internal Medicine) Magrinat, Virgie Dad, MD as Consulting Physician (Oncology) Norma Fredrickson, MD as Consulting Physician (Psychiatry) [name], MD as Consulting Physician (Orthopedic Surgery) Azucena Fallen, MD as Consulting Physician (Obstetrics and Gynecology) Gery Pray, MD as Consulting Physician (Radiation Oncology) Jovita Kussmaul, MD as Consulting Physician (General Surgery)  OTHER MD: Manon Hilding   CHIEF COMPLAINT: Estrogen receptor positive breast cancer in BRCA-positive patient  CURRENT TREATMENT: Adjuvant radiation pending   INTERVAL HISTORY: [name] was seen today for follow up and treatment of her contralateral breast cancer.   Since her last visit, she underwent right breast lumpectomy on 10/02/2018 under Dr. Marlou Starks. Pathology from the procedure [id number]) revealed: invasive ductal carcinoma, grade 2, 0.6 cm; ductal carcinoma in situ, high grade; invasive and in situ disease within 1-2 mm of lateral margin.  The prognostic panel is still pending.  She met with Dr. Sondra Come on 09/24/2018. She is scheduled to return on 10/29/2018 for re-evaluation and CT simulation.    REVIEW OF SYSTEMS: [name] did well with her surgery, with no significant pain, bleeding, or fever.  She is very pleased with the cosmetic result.  She did have some reaction to the iodine and to some of the bandages.  Her skin is very allergic she says.  She is feeling somewhat anxious because of the new finding of invasive disease which was not expected.  She had many appropriate questions today which are addressed below.  Otherwise a detailed review of systems today was stable  HISTORY OF BREAST CANCER: From the original intake note:   Zenaida had an unremarkable mammogram at [facility] on December 16, 2006.  Her breasts are dense, and of course that decreases the sensitivity.  In any case the screening mammogram was read as unremarkable.  In December, however, Sumner had her annual examination under [name], and Dr. Irven Baltimore palpated a lump in the left breast at the 3 o'clock position.  The patient was referred back for ultrasound, and this showed a hypoechoic mass measuring 1.4 cm corresponding to the palpable abnormality.  Going back to the September mammogram, Dr. [name] was able to tease out what possibly could have been a mammographic finding, but really it was so equivocal, that even in retrospect, the mammogram was not informative.   This was followed up with breast specific gamma imaging on March 11, 2007, and this found a solitary lesion, measuring up to 1.7 cm.  In addition, there was a 1.0 cm low intensity uptake area in the axilla, considered an indeterminate finding.    With this information, the patient proceeded to biopsy under ultrasound guidance of the mass.  This was performed on December 9, and it showed an invasive ductal carcinoma, which was ER positive at 100%, PR positive at 56%, with a borderline/low proliferation marker at 17%, and Hercept test negative at 1+.  [name] was then referred to Dr. Hassell Done for further evaluation, and direction for definitive surgery, and she underwent left lumpectomy and sentinel lymph node biopsy March 13, 2007 for what proved to be ([id number]) a 2.1 cm invasive ductal carcinoma with some micro-papillary features, grade 2, with evidence of lymphovascular invasion, but zero of two sentinel lymph nodes involved.  Margins were adequate for both the invasive and in situ components.   Her subsequent  history is as detailed below.  PAST MEDICAL HISTORY: Past Medical History:  Diagnosis Date  . Anxiety   . Anxiety disorder   . BRCA2 positive 07/14/2012  . Cancer [facility]) 2008   left breast  . Dyslipidemia   . Fall   . Goiter   .  History of radiation therapy   . Hypothyroidism   . Osteoporosis     PAST SURGICAL HISTORY: Past Surgical History:  Procedure Laterality Date  . ABDOMINAL HYSTERECTOMY  2009   with BSO  . BREAST LUMPECTOMY  [id number], 1991  . BREAST LUMPECTOMY WITH RADIOACTIVE SEED LOCALIZATION Right 10/02/2018   Procedure: RIGHT BREAST LUMPECTOMY WITH RADIOACTIVE SEED LOCALIZATION;  Surgeon: Jovita Kussmaul, MD;  Location: [facility];  Service: General;  Laterality: Right;  . COLONOSCOPY N/A 12/13/2015   Procedure: COLONOSCOPY;  Surgeon: [name], MD;  Location: [facility];  Service: Endoscopy;  Laterality: N/A;  . FRACTURE SURGERY  2015   left hip  . HARDWARE REMOVAL Left 07/08/2015   Procedure: LEFT HIP HARDWARE REMOVAL;  Surgeon: [name], MD;  Location: [facility];  Service: Orthopedics;  Laterality: Left;    FAMILY HISTORY (updated OCT 2013) Family History  Problem Relation Age of Onset  . Cancer Mother    The patient's father is currently 15, with a remote history of colon cancer.  The patient's mother died at the age of 56.  She had a history of breast cancer and the patient's mother's mother died from cancer, but the actual original site of cancer is unclear.  The patient has several stepbrothers and sisters, but only one half-sister, currently 105, with no history of cancer.    GYNECOLOGIC HISTORY: She is GX, P2.  First pregnancy age 61.  Last menstrual period age 41.  She did not take hormone replacement.     SOCIAL HISTORY: (updated OCT 2013) Of course she is [name]. He has 4 children from an earlier marriage. She has two from hers, a son who works at [employer] in [place], and a  son who lives in [place]. They have 3 grandchildren.    ADVANCED DIRECTIVES: in place   HEALTH MAINTENANCE: Social History   Tobacco Use  . Smoking status: Former Smoker    Types: Cigarettes  . Smokeless tobacco: Never Used  Substance Use Topics  . Alcohol use: Yes     Comment: GLASS OF WINE MOST DAYS  . Drug use: No     Colonoscopy: August 2017/ [facility]  PAP: April 2014  Bone density: 2013 AT [facility]/ stable osteoporosis  Lipid panel:  Allergies  Allergen Reactions  . Adhesive [Tape] Rash  . Chloraprep One Step [Chlorhexidine Gluconate] Itching    Skin tears  . Latex Rash  . Betadine [Povidone Iodine]   . Ampicillin Rash    Has patient had a PCN reaction causing immediate rash, facial/tongue/throat swelling, SOB or lightheadedness with hypotension: No Has patient had a PCN reaction causing severe rash involving mucus membranes or skin necrosis: No Has patient had a PCN reaction that required hospitalization No Has patient had a PCN reaction occurring within the last 10 years: No If all of the above answers are "NO", then may proceed with Cephalosporin use.  Clancy Gourd [Nitrofurantoin] Rash    Current Outpatient Medications  Medication Sig Dispense Refill  . alendronate (FOSAMAX) 70 MG tablet Take 70 mg by mouth once a week.     Marland Kitchen atorvastatin (LIPITOR) 10 MG tablet Take 5  mg by mouth daily.     . Biotin 1 MG CAPS Take by mouth.    . BL EVENING PRIMROSE OIL PO Take by mouth.    . Cholecalciferol (VITAMIN D3) 2000 UNITS TABS Take 1 tablet by mouth daily.     . clorazepate (TRANXENE) 7.5 MG tablet Take 15 mg by mouth at bedtime.     Marland Kitchen HYDROcodone-acetaminophen (NORCO/VICODIN) 5-325 MG tablet Take 1-2 tablets by mouth every 6 (six) hours as needed for moderate pain or severe pain. 15 tablet 0  . ibuprofen (ADVIL,MOTRIN) 200 MG tablet Take 400 mg by mouth every 6 (six) hours as needed for mild pain.    Marland Kitchen levothyroxine (SYNTHROID, LEVOTHROID) 125 MCG tablet Take 125 mcg by mouth daily.      . multivitamin-lutein (OCUVITE-LUTEIN) CAPS capsule Take 1 capsule by mouth daily.    . raloxifene (EVISTA) 60 MG tablet TAKE 1 TABLET BY MOUTH ONCE DAILY 90 tablet 4  . traZODone (DESYREL) 100 MG tablet Take 100 mg by mouth at bedtime.    . tretinoin  (RETIN-A) 0.025 % cream Apply 1 application topically at bedtime.      No current facility-administered medications for this visit.     OBJECTIVE: Middle-aged white woman in no acute distress  There were no vitals filed for this visit.   There is no height or weight on file to calculate BMI.    ECOG FS: 1 There were no vitals filed for this visit.  The right breast is status post recent lumpectomy.  The periareolar incision is healing nicely with no dehiscence swelling or erythema.  The cosmetic result is excellent.  LAB RESULTS: Lab Results  Component Value Date   WBC 5.7 04/14/2018   NEUTROABS 3.2 04/14/2018   HGB 12.8 04/14/2018   HCT 38.4 04/14/2018   MCV 86.1 04/14/2018   PLT 228 04/14/2018      Chemistry      Component Value Date/Time   NA 136 04/14/2018 0934   NA 138 02/15/2016 1229   K 4.3 04/14/2018 0934   K 4.5 02/15/2016 1229   CL 105 04/14/2018 0934   CL 106 07/16/2012 1201   CO2 26 04/14/2018 0934   CO2 26 02/15/2016 1229   BUN 15 04/14/2018 0934   BUN 13.7 02/15/2016 1229   CREATININE 0.99 04/14/2018 0934   CREATININE 0.9 02/15/2016 1229      Component Value Date/Time   CALCIUM 9.3 04/14/2018 0934   CALCIUM 10.4 02/15/2016 1229   ALKPHOS 54 04/14/2018 0934   ALKPHOS 54 02/15/2016 1229   AST 17 04/14/2018 0934   AST 22 02/15/2016 1229   ALT 19 04/14/2018 0934   ALT 25 02/15/2016 1229   BILITOT 0.4 04/14/2018 0934   BILITOT 0.39 02/15/2016 1229       Lab Results  Component Value Date   LABCA2 20 01/09/2012      STUDIES: Pathology results discussed in detail  ASSESSMENT: 63 y.o. BRCA2 positive [place] woman   (1)  status post left upper outer quadrant lumpectomy and sentinel lymph node biopsy December 2008 for a T2 N0 invasive ductal carcinoma, grade 2, strongly estrogen and progesterone receptor positive, HER2 negative, with an MIB1 of 17%,  (a) Oncotype DX recurrence score of 20 predicted a 10-year risk of recurrence of 13% after 5  years on tamoxifen  (b) patient had taken tamoxifen for 5 years remotely for breast cancer prevention  (c) started Arimidex April 2009  (d) switched to tamoxifen January 2015  (  e) switched to raloxifene January 2019--discontinued June 2020  (2) s/p TAH/BSO with benign pathology in July 2009  (3) on intensified screening, receiving mammography in December and breast MRI in May yearly  (4) right breast biopsy 09/10/2018 shows ductal carcinoma in situ, high-grade  (5) status post right lumpectomy 10/02/2022 a pT1b pNX, stage I invasive ductal carcinoma, grade 2, with negative margins  (a) no sentinel lymph node sampling performed  (6) adjuvant radiation to follow-up  (7) to start exemestane at the completion of radiation   PLAN:  [name] is now 11-1/2 years out from definitive surgery for her left sided breast cancer.  She now has a new right-sided breast cancer.  She is known to be BRCA2 positive.  We did not expect to find invasive disease and therefore she did not have sentinel lymph node sampling.  She has a close margin.  I spent approximately 45 minutes face to face with [name] with more than 50% of that time spent in counseling and coordination of care.  We discussed her situation in detail.  Certainly we could proceed to further margin surgery but that is not necessary.  Generally radiation takes care of close margins and "a negative margin is a negative margin".  Certainly it would make no difference to her survival whether she had further surgery for that margin and she understands that.  The situation with the sentinel lymph node is more complex.  Certainly if we found that she had a positive lymph node we would have to consider perhaps more aggressive systemic options like chemotherapy.  On the other hand she had ultrasound and MRI of the axilla which were both negative and her tumor is 6 mm.  The chance of having a macroscopic tumor deposit in the lymph node is certainly going  to be less than 5%.  If she does have a microscopic deposit we would expect that to be taken care of by radiation  We reviewed NCCN guidelines which state that in the absence of any survival advantage having been demonstrated, and particularly favorable cases, sentinel lymph node sampling can be omitted.  I would certainly consider her case particularly favorable and therefore I am not uncomfortable with her for going sentinel lymph node sampling.  The big uncertainty at this point is the fact that we do not have the prognostic panel from the invasive disease.  Likely the tumor will be estrogen receptor positive but possibly it is estrogen receptor negative and if so we will need to consider perhaps CMF chemotherapy.  However her tumor is just about a T1a and NCCN guidelines suggest no chemotherapy for T1 a tumors and in fact antiestrogens are only to be considered, with no hard recommendation for antiestrogens.  In short I do not anticipate that she will need chemotherapy.  I do expect to switch her over to exemestane after she completes her radiation treatments and she will meet with Dr. Randa Ngo January 29 to get that started  I gave all this information to [name] in writing.  She is very clear in her mind that she does not want bilateral mastectomies, which she discussed extensively with Dr. Marlou Starks, and that if there is no survival advantage and I am comfortable with not proceeding to sentinel lymph node sampling then she would prefer not to do that procedure because of concerns regarding lymphedema.  I certainly am comfortable with that decision  I have not been able to discuss this with Dr. Marlou Starks yet because he is out  of town.  Also we will get the input from our entire team when the case is presented next Wednesday.  At this time though the plan is for no further surgery, proceed to radiation, and then start exemestane, while continuing intensified monitoring as previously  [name] has a good  understanding of this plan.  She agrees with it.  She knows to call for any other issue that may develop before the next visit here.  Magrinat, Virgie Dad, MD  10/07/18 4:25 PM Medical Oncology and Hematology [facility]ins [address] [place], [place] [address] Tel. [phone]    Fax. [phone]   I, Wilburn Mylar, am acting as scribe for Dr. Virgie Dad. Magrinat.  I, Lurline Del MD, have reviewed the above documentation for accuracy and completeness, and I agree with the above.   ADDENDUM: [name]'s breast cancer prognostic panel shows estrogen receptor positive at 95% with moderate intensity, progesterone receptor 5% with moderate intensity, key 6715% and HER-2 negative at 1+.  I called them and gave them the results.  I also discussed the overall plan with Dr. Sondra Come.  Dr. Marlou Starks the patient's surgeon is I believe currently out of town.

## 2018-10-08 ENCOUNTER — Other Ambulatory Visit: Payer: Self-pay

## 2018-10-08 ENCOUNTER — Inpatient Hospital Stay: Payer: BC Managed Care – PPO | Attending: Oncology | Admitting: Oncology

## 2018-10-08 DIAGNOSIS — Z923 Personal history of irradiation: Secondary | ICD-10-CM | POA: Diagnosis not present

## 2018-10-08 DIAGNOSIS — C50412 Malignant neoplasm of upper-outer quadrant of left female breast: Secondary | ICD-10-CM

## 2018-10-08 DIAGNOSIS — C50912 Malignant neoplasm of unspecified site of left female breast: Secondary | ICD-10-CM

## 2018-10-08 DIAGNOSIS — Z87891 Personal history of nicotine dependence: Secondary | ICD-10-CM | POA: Diagnosis not present

## 2018-10-08 DIAGNOSIS — Z79811 Long term (current) use of aromatase inhibitors: Secondary | ICD-10-CM | POA: Insufficient documentation

## 2018-10-08 DIAGNOSIS — D0511 Intraductal carcinoma in situ of right breast: Secondary | ICD-10-CM | POA: Insufficient documentation

## 2018-10-08 DIAGNOSIS — F419 Anxiety disorder, unspecified: Secondary | ICD-10-CM | POA: Insufficient documentation

## 2018-10-08 DIAGNOSIS — M810 Age-related osteoporosis without current pathological fracture: Secondary | ICD-10-CM | POA: Diagnosis not present

## 2018-10-08 DIAGNOSIS — Z803 Family history of malignant neoplasm of breast: Secondary | ICD-10-CM | POA: Diagnosis not present

## 2018-10-08 DIAGNOSIS — Z1501 Genetic susceptibility to malignant neoplasm of breast: Secondary | ICD-10-CM | POA: Diagnosis not present

## 2018-10-08 DIAGNOSIS — Z170 Estrogen receptor positive status [ER+]: Secondary | ICD-10-CM | POA: Diagnosis not present

## 2018-10-08 DIAGNOSIS — Z79899 Other long term (current) drug therapy: Secondary | ICD-10-CM | POA: Diagnosis not present

## 2018-10-08 DIAGNOSIS — E785 Hyperlipidemia, unspecified: Secondary | ICD-10-CM | POA: Diagnosis not present

## 2018-10-08 DIAGNOSIS — E039 Hypothyroidism, unspecified: Secondary | ICD-10-CM

## 2018-10-08 DIAGNOSIS — T8484XA Pain due to internal orthopedic prosthetic devices, implants and grafts, initial encounter: Secondary | ICD-10-CM

## 2018-10-10 ENCOUNTER — Telehealth: Payer: Self-pay | Admitting: *Deleted

## 2018-10-10 NOTE — Telephone Encounter (Signed)
VM left by pt stating she need MD ok to proceed with scheduling dental cleaning appointment per her dental office.  She did not leave name of dentist.  Chart revewed - pt not under active immunosuppressive therapy- call returned - obtained identified VM- message left informing pt she may proceed with cleaning as well as if note from this office needed to call again and leave name of dental office.

## 2018-10-15 ENCOUNTER — Encounter: Payer: Self-pay | Admitting: Adult Health

## 2018-10-15 ENCOUNTER — Other Ambulatory Visit: Payer: Self-pay | Admitting: Oncology

## 2018-10-15 NOTE — Progress Notes (Signed)
I called [name] to let her know the results of the discussion today at conference.  She understands that further surgery for sentinel lymph node sampling and possibly for improvement of the negative margin is the standard of care, but given the size of the tumor and what we discussed previously we are not uncomfortable with avoiding further surgery and proceeding directly to radiation  She understands it very likely if there is any further evidence of cancer in the future she will need bilateral mastectomies.

## 2018-10-29 ENCOUNTER — Ambulatory Visit
Admission: RE | Admit: 2018-10-29 | Discharge: 2018-10-29 | Disposition: A | Discharge status: Home / Self Care | Payer: BC Managed Care – PPO | Source: Ambulatory Visit | Attending: Radiation Oncology | Admitting: Radiation Oncology

## 2018-10-29 ENCOUNTER — Other Ambulatory Visit: Payer: Self-pay

## 2018-10-29 ENCOUNTER — Encounter: Payer: Self-pay | Admitting: Radiation Oncology

## 2018-10-29 VITALS — BP 135/76 | HR 67 | Temp 98.2°F | Resp 18 | Wt 139.2 lb

## 2018-10-29 DIAGNOSIS — C50111 Malignant neoplasm of central portion of right female breast: Secondary | ICD-10-CM | POA: Diagnosis not present

## 2018-10-29 DIAGNOSIS — Z853 Personal history of malignant neoplasm of breast: Secondary | ICD-10-CM | POA: Insufficient documentation

## 2018-10-29 DIAGNOSIS — Z923 Personal history of irradiation: Secondary | ICD-10-CM | POA: Insufficient documentation

## 2018-10-29 DIAGNOSIS — R0781 Pleurodynia: Secondary | ICD-10-CM | POA: Insufficient documentation

## 2018-10-29 DIAGNOSIS — Z1501 Genetic susceptibility to malignant neoplasm of breast: Secondary | ICD-10-CM | POA: Insufficient documentation

## 2018-10-29 DIAGNOSIS — Z79899 Other long term (current) drug therapy: Secondary | ICD-10-CM | POA: Insufficient documentation

## 2018-10-29 DIAGNOSIS — C50912 Malignant neoplasm of unspecified site of left female breast: Secondary | ICD-10-CM

## 2018-10-29 DIAGNOSIS — C50412 Malignant neoplasm of upper-outer quadrant of left female breast: Secondary | ICD-10-CM

## 2018-10-29 DIAGNOSIS — Z170 Estrogen receptor positive status [ER+]: Secondary | ICD-10-CM | POA: Insufficient documentation

## 2018-10-29 DIAGNOSIS — Z510 Encounter for antineoplastic radiation therapy: Secondary | ICD-10-CM | POA: Diagnosis not present

## 2018-10-29 DIAGNOSIS — C50411 Malignant neoplasm of upper-outer quadrant of right female breast: Secondary | ICD-10-CM | POA: Insufficient documentation

## 2018-10-29 NOTE — Progress Notes (Signed)
Location of Breast Cancer: lower central portion of the right breast  Histology per Pathology Report: 10/02/18:  Diagnosis Breast, lumpectomy, Right w/seed - INVASIVE CARCINOMA, GRADE 2, SPANNING 0.6 CM. - HIGH GRADE DUCTAL CARCINOMA IN SITU. - INVASIVE CARCINOMA AND IN SITU CARCINOMA COME TO WITHIN 0.1-0.2 CM OF THE LATERAL MARGIN FOCALLY. - BIOPSY SITE.  Receptor Status: ER(95%), PR (5%), Her2-neu (negative, 1+), Ki-(15%)  Did patient present with symptoms (if so, please note symptoms) or was this found on screening mammography?: Recall that she is under intensified surveillance because of her BRCA positivity.She underwent a digital screening bilateral mammogram on 03/04/2018 showing: Breast Density Category C. There is no mammographic evidence of malignancy.  On 09/08/2018 she underwent bilateral breast MRI. This showed a suspicious mass in the lower central portion of the right breast, measuring 0.9 cm. The left breast was negative and there were no axillary findings of concern.  On 09/10/2018 she underwent biopsy of the right breast area in question and this showed ([id number]) ductal carcinoma in situ,high-grade, prognostic panel pending. She is here to discuss those results.  Past/Anticipated interventions by surgeon, if any: 10/02/18:  PROCEDURE:  Procedure(s): RIGHT BREAST LUMPECTOMY WITH RADIOACTIVE SEED LOCALIZATION (Right)  SURGEON:  Surgeon(s) and Role:    Jovita Kussmaul, MD - Primary  Past/Anticipated interventions by medical oncology, if any: Chemotherapy Per Dr. Jana Hakim 10/15/18: I called Gwen to let her know the results of the discussion today at conference.  She understands that further surgery for sentinel lymph node sampling and possibly for improvement of the negative margin is the standard of care, but given the size of the tumor and what we discussed previously we are not uncomfortable with avoiding further surgery and proceeding directly to radiation  She  understands it very likely if there is any further evidence of cancer in the future she will need bilateral mastectomies  Lymphedema issues, if any:  Pt did not have lymph nodes removed and does not c/o lymphedema.  Pain issues, if any:  Pt reports RIGHT rib pain from known injury, rated 4/10. Pt denies c/o pain in breast.   SAFETY ISSUES:  Prior radiation? LEFT breast in 2009 under the direction of Dr. Sondra Come  Pacemaker/ICD? No  Possible current pregnancy? No  Is the patient on methotrexate? No  Current Complaints / other details:  Pt presents today for f/u new with Dr. Sondra Come for Radiation Oncology.   BP 135/76 (BP Location: Left Arm, Patient Position: Sitting)   Pulse 67   Temp 98.2 F (36.8 C) (Oral)   Resp 18   Wt 139 lb 3.2 oz (63.1 kg)   SpO2 100%   BMI 23.89 kg/m   Wt Readings from Last 3 Encounters:  10/29/18 139 lb 3.2 oz (63.1 kg)  10/02/18 138 lb (62.6 kg)  09/24/18 137 lb 6.4 oz (62.3 kg)       Loma Sousa, RN 10/29/2018,1:10 PM

## 2018-10-29 NOTE — Progress Notes (Signed)
  Radiation Oncology         [phone] ________________________________  Name: RYELEE Mullins MRN: [id number]  Date: 10/29/2018  DOB: Jan 30, 1956  SIMULATION AND TREATMENT PLANNING NOTE    ICD-10-CM   1. Malignant neoplasm of central portion of right breast in female, estrogen receptor positive ([place])  C50.111    Z17.0     DIAGNOSIS:  Stage IA (pT1b, pNX), central Right breast invasive ductal carcinoma, ER/PR+, HER2-, grade 2  NARRATIVE:  The patient was brought to the [facility].  Identity was confirmed.  All relevant records and images related to the planned course of therapy were reviewed.  The patient freely provided informed written consent to proceed with treatment after reviewing the details related to the planned course of therapy. The consent form was witnessed and verified by the simulation staff.  Then, the patient was set-up in a stable reproducible  supine position for radiation therapy.  CT images were obtained.  Surface markings were placed.  The CT images were loaded into the planning software.  Then the target and avoidance structures were contoured.  Treatment planning then occurred.  The radiation prescription was entered and confirmed.  Then, I designed and supervised the construction of a total of 7 medically necessary complex treatment devices.  I have requested : 3D Simulation  I have requested a DVH of the following structures: Heart, lungs, lumpectomy cavity.  I have ordered:CBC  PLAN:  The patient will receive 50.4 Gy in 28 fractions directed to the right breast area.  The axillary area will receive 45 Gy in 25 fractions.  Patient will then proceed with a boost to the lumpectomy cavity of 12 Gy in 6 fractions for a cumulative dose of 62.4 Gray to the lumpectomy cavity.   Optical Surface Tracking Plan:  Since intensity modulated radiotherapy (IMRT) and 3D conformal radiation treatment methods are predicated on accurate and precise positioning for  treatment, intrafraction motion monitoring is medically necessary to ensure accurate and safe treatment delivery.  The ability to quantify intrafraction motion without excessive ionizing radiation dose can only be performed with optical surface tracking. Accordingly, surface imaging offers the opportunity to obtain 3D measurements of patient position throughout IMRT and 3D treatments without excessive radiation exposure.  I am ordering optical surface tracking for this patient's upcoming course of radiotherapy. _______________________________    -----------------------------------  Blair Promise, PhD, MD  This document serves as a record of services personally performed by Gery Pray, MD. It was created on his behalf by Mary-Margaret Loma Messing, a trained medical scribe. The creation of this record is based on the scribe's personal observations and the provider's statements to them. This document has been checked and approved by the attending provider.

## 2018-10-29 NOTE — Patient Instructions (Signed)
Coronavirus (COVID-19) Are you at risk?  Are you at risk for the Coronavirus (COVID-19)?  To be considered HIGH RISK for Coronavirus (COVID-19), you have to meet the following criteria:  . Traveled to [place], [place], [place], [place] or [place]; or in the [place] to [place], [place], [place], or [place]; and have fever, cough, and shortness of breath within the last 2 weeks of travel OR . Been in close contact with a person diagnosed with COVID-19 within the last 2 weeks and have fever, cough, and shortness of breath . IF YOU DO NOT MEET THESE CRITERIA, YOU ARE CONSIDERED LOW RISK FOR COVID-19.  What to do if you are HIGH RISK for COVID-19?  . If you are having a medical emergency, call 911. . Seek medical care right away. Before you go to a doctor's office, urgent care or emergency department, call ahead and tell them about your recent travel, contact with someone diagnosed with COVID-19, and your symptoms. You should receive instructions from your physician's office regarding next steps of care.  . When you arrive at healthcare provider, tell the healthcare staff immediately you have returned from visiting [place], [place], [place], [place] or [place]; or traveled in the [place] to [place], [place], [place], or [place]; in the last two weeks or you have been in close contact with a person diagnosed with COVID-19 in the last 2 weeks.   . Tell the health care staff about your symptoms: fever, cough and shortness of breath. . After you have been seen by a medical provider, you will be either: o Tested for (COVID-19) and discharged home on quarantine except to seek medical care if symptoms worsen, and asked to  - Stay home and avoid contact with others until you get your results (4-5 days)  - Avoid travel on public transportation if possible (such as bus, train, or airplane) or o Sent to the Emergency Department by EMS for evaluation, COVID-19 testing, and possible  admission depending on your condition and test results.  What to do if you are LOW RISK for COVID-19?  Reduce your risk of any infection by using the same precautions used for avoiding the common cold or flu:  . Wash your hands often with soap and warm water for at least 20 seconds.  If soap and water are not readily available, use an alcohol-based hand sanitizer with at least 60% alcohol.  . If coughing or sneezing, cover your mouth and nose by coughing or sneezing into the elbow areas of your shirt or coat, into a tissue or into your sleeve (not your hands). . Avoid shaking hands with others and consider head nods or verbal greetings only. . Avoid touching your eyes, nose, or mouth with unwashed hands.  . Avoid close contact with people who are sick. . Avoid places or events with large numbers of people in one location, like concerts or sporting events. . Carefully consider travel plans you have or are making. . If you are planning any travel outside or inside the [place], visit the CDC's Travelers' Health webpage for the latest health notices. . If you have some symptoms but not all symptoms, continue to monitor at home and seek medical attention if your symptoms worsen. . If you are having a medical emergency, call 911.   ADDITIONAL HEALTHCARE OPTIONS FOR PATIENTS  [facility] / e-Visit: [email or web address]         [facility]: [phone]  [facility]: [phone]                   [facility]: [phone]   

## 2018-10-29 NOTE — Progress Notes (Addendum)
Radiation Oncology         [phone] ________________________________  Name: [name] MRN: [id number]  Date: 10/29/2018  DOB: 1955-06-13  Re-evaluation Visit Note  CC: Levin Erp, MD  Magrinat, Virgie Dad, MD    ICD-10-CM   1. Malignant neoplasm of central portion of right breast in female, estrogen receptor positive ([place])  C50.111    Z17.0   2. Breast cancer, BRCA2 positive, left ([facility] [place])  C50.912    Z15.01     Diagnosis:   Stage IA (pT1b, pNX), central Right breast invasive ductal carcinoma, ER/PR+, HER2-, grade 2  Interval Since Last Radiation:  11 years  PREVIOUS RADIATION THERAPY: Yes, in 2009 on left breast with Dr. Sondra Come, patient was found to have stage IIa disease and received radiation therapy as breast conserving treatment.  Treatments extending from May 22, 2007 through July 15, 2007.  The left breast received 50.4 Gray followed by a boost to the lumpectomy cavity for a cumulative dose of 63 Gy  Narrative:  The patient returns today for reevaluation. She was last seen in June 2020 prior to her lumpectomy.  she is doing well overall.    Since they were last seen in the office, she had right breast lumpectomy with seed on 10/02/18. Pathology determined invasive ductal carcinoma, grade 2 spanning 0.6 cm, as well as previously noted high grade DCIS. Both came to within 0.1-0.2 cm of the lateral margin focally. Prognostic indicators significant for: ER, 95% positive and PR, 5% positive, both with moderate staining intensity. Proliferation marker Ki67 at 15. HER2 negative.                  On review of systems, she reports right rib pain from known injury, rated 4/10. she denies pain in her breast and any other symptoms. Pertinent positives are listed and detailed within the above HPI.                 ALLERGIES:  is allergic to adhesive [tape]; chloraprep one step [chlorhexidine gluconate]; latex; betadine [povidone iodine]; ampicillin; and macrodantin  [nitrofurantoin].  Meds: Current Outpatient Medications  Medication Sig Dispense Refill   alendronate (FOSAMAX) 70 MG tablet Take 70 mg by mouth once a week.      atorvastatin (LIPITOR) 10 MG tablet Take 5 mg by mouth daily.      Biotin 1 MG CAPS Take by mouth.     BL EVENING PRIMROSE OIL PO Take by mouth.     Cholecalciferol (VITAMIN D3) 2000 UNITS TABS Take 1 tablet by mouth daily.      clorazepate (TRANXENE) 7.5 MG tablet Take 15 mg by mouth at bedtime.      ibuprofen (ADVIL,MOTRIN) 200 MG tablet Take 400 mg by mouth every 6 (six) hours as needed for mild pain.     levothyroxine (SYNTHROID, LEVOTHROID) 125 MCG tablet Take 125 mcg by mouth daily.       multivitamin-lutein (OCUVITE-LUTEIN) CAPS capsule Take 1 capsule by mouth daily.     traZODone (DESYREL) 100 MG tablet Take 100 mg by mouth at bedtime.     tretinoin (RETIN-A) 0.025 % cream Apply 1 application topically at bedtime.      HYDROcodone-acetaminophen (NORCO/VICODIN) 5-325 MG tablet Take 1-2 tablets by mouth every 6 (six) hours as needed for moderate pain or severe pain. (Patient not taking: Reported on 10/29/2018) 15 tablet 0   raloxifene (EVISTA) 60 MG tablet TAKE 1 TABLET BY MOUTH ONCE DAILY (Patient not taking: Reported on 10/29/2018)  90 tablet 4   No current facility-administered medications for this encounter.     Physical Findings: The patient is in no acute distress. Patient is alert and oriented.  weight is 139 lb 3.2 oz (63.1 kg). Her oral temperature is 98.2 F (36.8 C). Her blood pressure is 135/76 and her pulse is 67. Her respiration is 18 and oxygen saturation is 100%. .  . Lungs are clear to auscultation bilaterally. Heart has regular rate and rhythm. No palpable cervical, supraclavicular, or axillary adenopathy. Abdomen soft, non-tender, normal bowel sounds. She has a barely visible periareolar scar in the lower aspect of the breast. No signs of drainage or infection. No significant swelling in the  breast. No palpable mass, nipple discharge or bleeding. No palpable axillary adenopathy.   Lab Findings: Lab Results  Component Value Date   WBC 5.7 04/14/2018   HGB 12.8 04/14/2018   HCT 38.4 04/14/2018   MCV 86.1 04/14/2018   PLT 228 04/14/2018    Radiographic Findings: No results found.  Impression:   Stage IA (pT1b, pNX), central Right breast invasive ductal carcinoma, ER/PR+, HER2-, grade 2  The pt's case was presented at the multidisciplinary breast clinic and was recommended consideration for SLN, but given the pt's excellent prognosis concerning this breast cancer, it was felt reasonable to avoid this surgery and cover the axillary region with radiation therapy. I discussed this treatment approach with this patient and she is agreeable to axillary radiation therapy rather than proceeding with additional surgery  Plan:  She will proceed with CT simulation later this afternoon with treatments to begin next week. She will receive  25 treatments to the axillary region, 28 treatments to the right breast. This will then be followed by boost to the lumpectomy cavity of 6 treatments.  She is not a candidate for hypofractionated radiation therapy since we will be covering the axillary region.  ____________________________________   Blair Promise, PhD, MD    This document serves as a record of services personally performed by Gery Pray, MD. It was created on his behalf by Mary-Margaret Loma Messing, a trained medical scribe. The creation of this record is based on the scribe's personal observations and the provider's statements to them. This document has been checked and approved by the attending provider.

## 2018-11-04 DIAGNOSIS — Z510 Encounter for antineoplastic radiation therapy: Secondary | ICD-10-CM | POA: Diagnosis not present

## 2018-11-04 DIAGNOSIS — Z170 Estrogen receptor positive status [ER+]: Secondary | ICD-10-CM | POA: Insufficient documentation

## 2018-11-04 DIAGNOSIS — C50111 Malignant neoplasm of central portion of right female breast: Secondary | ICD-10-CM | POA: Diagnosis not present

## 2018-11-05 ENCOUNTER — Ambulatory Visit
Admission: RE | Admit: 2018-11-05 | Discharge: 2018-11-05 | Disposition: A | Discharge status: Home / Self Care | Payer: BC Managed Care – PPO | Source: Ambulatory Visit | Attending: Radiation Oncology | Admitting: Radiation Oncology

## 2018-11-06 ENCOUNTER — Other Ambulatory Visit: Payer: Self-pay

## 2018-11-06 ENCOUNTER — Ambulatory Visit
Admission: RE | Admit: 2018-11-06 | Discharge: 2018-11-06 | Disposition: A | Discharge status: Home / Self Care | Payer: BC Managed Care – PPO | Source: Ambulatory Visit | Attending: Radiation Oncology | Admitting: Radiation Oncology

## 2018-11-06 DIAGNOSIS — Z170 Estrogen receptor positive status [ER+]: Secondary | ICD-10-CM | POA: Diagnosis not present

## 2018-11-06 DIAGNOSIS — Z510 Encounter for antineoplastic radiation therapy: Secondary | ICD-10-CM | POA: Diagnosis not present

## 2018-11-06 DIAGNOSIS — C50111 Malignant neoplasm of central portion of right female breast: Secondary | ICD-10-CM

## 2018-11-06 NOTE — Progress Notes (Signed)
  Radiation Oncology         [phone] ________________________________  Name: [name] MRN: [id number]  Date: 11/06/2018  DOB: 1955/08/12  Simulation Verification Note    ICD-10-CM   1. Malignant neoplasm of central portion of right breast in female, estrogen receptor positive ([place])  C50.111    Z17.0     Status: outpatient  NARRATIVE: The patient was brought to the treatment unit and placed in the planned treatment position. The clinical setup was verified. Then port films were obtained and uploaded to the radiation oncology medical record software.  The treatment beams were carefully compared against the planned radiation fields. The position location and shape of the radiation fields was reviewed. They targeted volume of tissue appears to be appropriately covered by the radiation beams. Organs at risk appear to be excluded as planned.  Based on my personal review, I approved the simulation verification. The patient's treatment will proceed as planned.  -----------------------------------  Blair Promise, PhD, MD  This document serves as a record of services personally performed by Gery Pray, MD. It was created on his behalf by Rae Lips, a trained medical scribe. The creation of this record is based on the scribe's personal observations and the provider's statements to them. This document has been checked and approved by the attending provider.

## 2018-11-07 ENCOUNTER — Ambulatory Visit
Admission: RE | Admit: 2018-11-07 | Discharge: 2018-11-07 | Disposition: A | Discharge status: Home / Self Care | Payer: BC Managed Care – PPO | Source: Ambulatory Visit | Attending: Radiation Oncology | Admitting: Radiation Oncology

## 2018-11-07 ENCOUNTER — Other Ambulatory Visit: Payer: Self-pay

## 2018-11-07 DIAGNOSIS — Z170 Estrogen receptor positive status [ER+]: Secondary | ICD-10-CM | POA: Diagnosis not present

## 2018-11-07 DIAGNOSIS — Z510 Encounter for antineoplastic radiation therapy: Secondary | ICD-10-CM | POA: Diagnosis not present

## 2018-11-07 DIAGNOSIS — C50111 Malignant neoplasm of central portion of right female breast: Secondary | ICD-10-CM | POA: Diagnosis not present

## 2018-11-10 ENCOUNTER — Ambulatory Visit
Admission: RE | Admit: 2018-11-10 | Discharge: 2018-11-10 | Disposition: A | Discharge status: Home / Self Care | Payer: BC Managed Care – PPO | Source: Ambulatory Visit | Attending: Radiation Oncology | Admitting: Radiation Oncology

## 2018-11-10 DIAGNOSIS — C50111 Malignant neoplasm of central portion of right female breast: Secondary | ICD-10-CM | POA: Diagnosis not present

## 2018-11-10 DIAGNOSIS — Z170 Estrogen receptor positive status [ER+]: Secondary | ICD-10-CM | POA: Diagnosis not present

## 2018-11-10 DIAGNOSIS — Z510 Encounter for antineoplastic radiation therapy: Secondary | ICD-10-CM | POA: Diagnosis not present

## 2018-11-11 ENCOUNTER — Ambulatory Visit
Admission: RE | Admit: 2018-11-11 | Discharge: 2018-11-11 | Disposition: A | Discharge status: Home / Self Care | Payer: BC Managed Care – PPO | Source: Ambulatory Visit | Attending: Radiation Oncology | Admitting: Radiation Oncology

## 2018-11-11 DIAGNOSIS — Z510 Encounter for antineoplastic radiation therapy: Secondary | ICD-10-CM | POA: Diagnosis not present

## 2018-11-11 DIAGNOSIS — C50111 Malignant neoplasm of central portion of right female breast: Secondary | ICD-10-CM | POA: Diagnosis not present

## 2018-11-11 DIAGNOSIS — Z170 Estrogen receptor positive status [ER+]: Secondary | ICD-10-CM | POA: Diagnosis not present

## 2018-11-12 ENCOUNTER — Other Ambulatory Visit: Payer: Self-pay

## 2018-11-12 ENCOUNTER — Ambulatory Visit
Admission: RE | Admit: 2018-11-12 | Discharge: 2018-11-12 | Disposition: A | Discharge status: Home / Self Care | Payer: BC Managed Care – PPO | Source: Ambulatory Visit | Attending: Radiation Oncology | Admitting: Radiation Oncology

## 2018-11-12 DIAGNOSIS — Z510 Encounter for antineoplastic radiation therapy: Secondary | ICD-10-CM | POA: Diagnosis not present

## 2018-11-12 DIAGNOSIS — Z170 Estrogen receptor positive status [ER+]: Secondary | ICD-10-CM | POA: Diagnosis not present

## 2018-11-12 DIAGNOSIS — C50111 Malignant neoplasm of central portion of right female breast: Secondary | ICD-10-CM | POA: Diagnosis not present

## 2018-11-13 ENCOUNTER — Other Ambulatory Visit: Payer: Self-pay

## 2018-11-13 ENCOUNTER — Ambulatory Visit
Admission: RE | Admit: 2018-11-13 | Discharge: 2018-11-13 | Disposition: A | Discharge status: Home / Self Care | Payer: BC Managed Care – PPO | Source: Ambulatory Visit | Attending: Radiation Oncology | Admitting: Radiation Oncology

## 2018-11-13 DIAGNOSIS — Z510 Encounter for antineoplastic radiation therapy: Secondary | ICD-10-CM | POA: Diagnosis not present

## 2018-11-13 DIAGNOSIS — C50111 Malignant neoplasm of central portion of right female breast: Secondary | ICD-10-CM | POA: Diagnosis not present

## 2018-11-13 DIAGNOSIS — Z170 Estrogen receptor positive status [ER+]: Secondary | ICD-10-CM | POA: Diagnosis not present

## 2018-11-14 ENCOUNTER — Other Ambulatory Visit: Payer: Self-pay

## 2018-11-14 ENCOUNTER — Ambulatory Visit
Admission: RE | Admit: 2018-11-14 | Discharge: 2018-11-14 | Disposition: A | Discharge status: Home / Self Care | Payer: BC Managed Care – PPO | Source: Ambulatory Visit | Attending: Radiation Oncology | Admitting: Radiation Oncology

## 2018-11-14 DIAGNOSIS — C50111 Malignant neoplasm of central portion of right female breast: Secondary | ICD-10-CM | POA: Diagnosis not present

## 2018-11-14 DIAGNOSIS — Z510 Encounter for antineoplastic radiation therapy: Secondary | ICD-10-CM | POA: Diagnosis not present

## 2018-11-14 DIAGNOSIS — Z170 Estrogen receptor positive status [ER+]: Secondary | ICD-10-CM | POA: Diagnosis not present

## 2018-11-17 ENCOUNTER — Ambulatory Visit
Admission: RE | Admit: 2018-11-17 | Discharge: 2018-11-17 | Disposition: A | Discharge status: Home / Self Care | Payer: BC Managed Care – PPO | Source: Ambulatory Visit | Attending: Radiation Oncology | Admitting: Radiation Oncology

## 2018-11-17 DIAGNOSIS — C50111 Malignant neoplasm of central portion of right female breast: Secondary | ICD-10-CM | POA: Diagnosis not present

## 2018-11-17 DIAGNOSIS — Z170 Estrogen receptor positive status [ER+]: Secondary | ICD-10-CM | POA: Diagnosis not present

## 2018-11-17 DIAGNOSIS — Z510 Encounter for antineoplastic radiation therapy: Secondary | ICD-10-CM | POA: Diagnosis not present

## 2018-11-18 ENCOUNTER — Ambulatory Visit
Admission: RE | Admit: 2018-11-18 | Discharge: 2018-11-18 | Disposition: A | Discharge status: Home / Self Care | Payer: BC Managed Care – PPO | Source: Ambulatory Visit | Attending: Radiation Oncology | Admitting: Radiation Oncology

## 2018-11-18 DIAGNOSIS — Z510 Encounter for antineoplastic radiation therapy: Secondary | ICD-10-CM | POA: Diagnosis not present

## 2018-11-18 DIAGNOSIS — Z170 Estrogen receptor positive status [ER+]: Secondary | ICD-10-CM | POA: Diagnosis not present

## 2018-11-18 DIAGNOSIS — C50111 Malignant neoplasm of central portion of right female breast: Secondary | ICD-10-CM | POA: Diagnosis not present

## 2018-11-19 ENCOUNTER — Ambulatory Visit
Admission: RE | Admit: 2018-11-19 | Discharge: 2018-11-19 | Disposition: A | Discharge status: Home / Self Care | Payer: BC Managed Care – PPO | Source: Ambulatory Visit | Attending: Radiation Oncology | Admitting: Radiation Oncology

## 2018-11-19 DIAGNOSIS — Z510 Encounter for antineoplastic radiation therapy: Secondary | ICD-10-CM | POA: Diagnosis not present

## 2018-11-19 DIAGNOSIS — Z170 Estrogen receptor positive status [ER+]: Secondary | ICD-10-CM | POA: Diagnosis not present

## 2018-11-19 DIAGNOSIS — C50111 Malignant neoplasm of central portion of right female breast: Secondary | ICD-10-CM | POA: Diagnosis not present

## 2018-11-20 ENCOUNTER — Ambulatory Visit
Admission: RE | Admit: 2018-11-20 | Discharge: 2018-11-20 | Disposition: A | Discharge status: Home / Self Care | Payer: BC Managed Care – PPO | Source: Ambulatory Visit | Attending: Radiation Oncology | Admitting: Radiation Oncology

## 2018-11-20 ENCOUNTER — Other Ambulatory Visit: Payer: Self-pay

## 2018-11-20 DIAGNOSIS — Z170 Estrogen receptor positive status [ER+]: Secondary | ICD-10-CM | POA: Diagnosis not present

## 2018-11-20 DIAGNOSIS — C50111 Malignant neoplasm of central portion of right female breast: Secondary | ICD-10-CM | POA: Diagnosis not present

## 2018-11-20 DIAGNOSIS — Z510 Encounter for antineoplastic radiation therapy: Secondary | ICD-10-CM | POA: Diagnosis not present

## 2018-11-24 ENCOUNTER — Ambulatory Visit
Admission: RE | Admit: 2018-11-24 | Discharge: 2018-11-24 | Disposition: A | Discharge status: Home / Self Care | Payer: BC Managed Care – PPO | Source: Ambulatory Visit | Attending: Radiation Oncology | Admitting: Radiation Oncology

## 2018-11-24 ENCOUNTER — Other Ambulatory Visit: Payer: Self-pay

## 2018-11-24 DIAGNOSIS — C50111 Malignant neoplasm of central portion of right female breast: Secondary | ICD-10-CM | POA: Diagnosis not present

## 2018-11-24 DIAGNOSIS — Z510 Encounter for antineoplastic radiation therapy: Secondary | ICD-10-CM | POA: Diagnosis not present

## 2018-11-24 DIAGNOSIS — F4322 Adjustment disorder with anxiety: Secondary | ICD-10-CM | POA: Diagnosis not present

## 2018-11-24 DIAGNOSIS — Z170 Estrogen receptor positive status [ER+]: Secondary | ICD-10-CM | POA: Diagnosis not present

## 2018-11-25 ENCOUNTER — Ambulatory Visit
Admission: RE | Admit: 2018-11-25 | Discharge: 2018-11-25 | Disposition: A | Discharge status: Home / Self Care | Payer: BC Managed Care – PPO | Source: Ambulatory Visit | Attending: Radiation Oncology | Admitting: Radiation Oncology

## 2018-11-25 ENCOUNTER — Other Ambulatory Visit: Payer: Self-pay

## 2018-11-25 DIAGNOSIS — Z510 Encounter for antineoplastic radiation therapy: Secondary | ICD-10-CM | POA: Diagnosis not present

## 2018-11-25 DIAGNOSIS — Z170 Estrogen receptor positive status [ER+]: Secondary | ICD-10-CM | POA: Diagnosis not present

## 2018-11-25 DIAGNOSIS — C50111 Malignant neoplasm of central portion of right female breast: Secondary | ICD-10-CM | POA: Diagnosis not present

## 2018-11-25 NOTE — Progress Notes (Signed)
No show

## 2018-11-26 ENCOUNTER — Other Ambulatory Visit: Payer: Self-pay

## 2018-11-26 ENCOUNTER — Encounter: Payer: Self-pay | Admitting: Oncology

## 2018-11-26 ENCOUNTER — Ambulatory Visit
Admission: RE | Admit: 2018-11-26 | Discharge: 2018-11-26 | Disposition: A | Discharge status: Home / Self Care | Payer: BC Managed Care – PPO | Source: Ambulatory Visit | Attending: Radiation Oncology | Admitting: Radiation Oncology

## 2018-11-26 ENCOUNTER — Inpatient Hospital Stay: Payer: BC Managed Care – PPO | Attending: Oncology | Admitting: Oncology

## 2018-11-26 ENCOUNTER — Inpatient Hospital Stay: Payer: BC Managed Care – PPO

## 2018-11-26 DIAGNOSIS — C50412 Malignant neoplasm of upper-outer quadrant of left female breast: Secondary | ICD-10-CM

## 2018-11-26 DIAGNOSIS — Z170 Estrogen receptor positive status [ER+]: Secondary | ICD-10-CM

## 2018-11-26 DIAGNOSIS — Z510 Encounter for antineoplastic radiation therapy: Secondary | ICD-10-CM | POA: Diagnosis not present

## 2018-11-26 DIAGNOSIS — C50111 Malignant neoplasm of central portion of right female breast: Secondary | ICD-10-CM | POA: Diagnosis not present

## 2018-11-26 MED ORDER — EXEMESTANE 25 MG PO TABS: 25.0000 mg | ORAL_TABLET | Freq: Every day | ORAL | 4 refills | Status: DC

## 2018-11-27 ENCOUNTER — Ambulatory Visit
Admission: RE | Admit: 2018-11-27 | Discharge: 2018-11-27 | Disposition: A | Discharge status: Home / Self Care | Payer: BC Managed Care – PPO | Source: Ambulatory Visit | Attending: Radiation Oncology | Admitting: Radiation Oncology

## 2018-11-27 ENCOUNTER — Other Ambulatory Visit: Payer: Self-pay

## 2018-11-27 DIAGNOSIS — Z510 Encounter for antineoplastic radiation therapy: Secondary | ICD-10-CM | POA: Diagnosis not present

## 2018-11-27 DIAGNOSIS — Z170 Estrogen receptor positive status [ER+]: Secondary | ICD-10-CM | POA: Diagnosis not present

## 2018-11-27 DIAGNOSIS — C50111 Malignant neoplasm of central portion of right female breast: Secondary | ICD-10-CM | POA: Diagnosis not present

## 2018-11-28 ENCOUNTER — Other Ambulatory Visit: Payer: Self-pay

## 2018-11-28 ENCOUNTER — Ambulatory Visit
Admission: RE | Admit: 2018-11-28 | Discharge: 2018-11-28 | Disposition: A | Discharge status: Home / Self Care | Payer: BC Managed Care – PPO | Source: Ambulatory Visit | Attending: Radiation Oncology | Admitting: Radiation Oncology

## 2018-11-28 DIAGNOSIS — Z510 Encounter for antineoplastic radiation therapy: Secondary | ICD-10-CM | POA: Diagnosis not present

## 2018-11-28 DIAGNOSIS — C50111 Malignant neoplasm of central portion of right female breast: Secondary | ICD-10-CM | POA: Diagnosis not present

## 2018-11-28 DIAGNOSIS — Z170 Estrogen receptor positive status [ER+]: Secondary | ICD-10-CM | POA: Diagnosis not present

## 2018-12-01 ENCOUNTER — Other Ambulatory Visit: Payer: Self-pay

## 2018-12-01 ENCOUNTER — Ambulatory Visit
Admission: RE | Admit: 2018-12-01 | Discharge: 2018-12-01 | Disposition: A | Discharge status: Home / Self Care | Payer: BC Managed Care – PPO | Source: Ambulatory Visit | Attending: Radiation Oncology | Admitting: Radiation Oncology

## 2018-12-01 DIAGNOSIS — C50111 Malignant neoplasm of central portion of right female breast: Secondary | ICD-10-CM | POA: Diagnosis not present

## 2018-12-01 DIAGNOSIS — Z510 Encounter for antineoplastic radiation therapy: Secondary | ICD-10-CM | POA: Diagnosis not present

## 2018-12-01 DIAGNOSIS — Z170 Estrogen receptor positive status [ER+]: Secondary | ICD-10-CM | POA: Diagnosis not present

## 2018-12-02 ENCOUNTER — Other Ambulatory Visit: Payer: Self-pay

## 2018-12-02 ENCOUNTER — Ambulatory Visit
Admission: RE | Admit: 2018-12-02 | Discharge: 2018-12-02 | Disposition: A | Discharge status: Home / Self Care | Payer: BC Managed Care – PPO | Source: Ambulatory Visit | Attending: Radiation Oncology | Admitting: Radiation Oncology

## 2018-12-02 DIAGNOSIS — C50111 Malignant neoplasm of central portion of right female breast: Secondary | ICD-10-CM | POA: Insufficient documentation

## 2018-12-02 DIAGNOSIS — Z510 Encounter for antineoplastic radiation therapy: Secondary | ICD-10-CM | POA: Diagnosis not present

## 2018-12-02 DIAGNOSIS — Z170 Estrogen receptor positive status [ER+]: Secondary | ICD-10-CM | POA: Insufficient documentation

## 2018-12-03 ENCOUNTER — Ambulatory Visit
Admission: RE | Admit: 2018-12-03 | Discharge: 2018-12-03 | Disposition: A | Discharge status: Home / Self Care | Payer: BC Managed Care – PPO | Source: Ambulatory Visit | Attending: Radiation Oncology | Admitting: Radiation Oncology

## 2018-12-03 ENCOUNTER — Other Ambulatory Visit: Payer: Self-pay

## 2018-12-03 DIAGNOSIS — Z170 Estrogen receptor positive status [ER+]: Secondary | ICD-10-CM | POA: Diagnosis not present

## 2018-12-03 DIAGNOSIS — C50111 Malignant neoplasm of central portion of right female breast: Secondary | ICD-10-CM | POA: Diagnosis not present

## 2018-12-03 DIAGNOSIS — Z510 Encounter for antineoplastic radiation therapy: Secondary | ICD-10-CM | POA: Diagnosis not present

## 2018-12-04 ENCOUNTER — Other Ambulatory Visit: Payer: Self-pay

## 2018-12-04 ENCOUNTER — Ambulatory Visit
Admission: RE | Admit: 2018-12-04 | Discharge: 2018-12-04 | Disposition: A | Discharge status: Home / Self Care | Payer: BC Managed Care – PPO | Source: Ambulatory Visit | Attending: Radiation Oncology | Admitting: Radiation Oncology

## 2018-12-04 DIAGNOSIS — C50111 Malignant neoplasm of central portion of right female breast: Secondary | ICD-10-CM | POA: Diagnosis not present

## 2018-12-04 DIAGNOSIS — Z170 Estrogen receptor positive status [ER+]: Secondary | ICD-10-CM | POA: Diagnosis not present

## 2018-12-04 DIAGNOSIS — Z510 Encounter for antineoplastic radiation therapy: Secondary | ICD-10-CM | POA: Diagnosis not present

## 2018-12-05 ENCOUNTER — Other Ambulatory Visit: Payer: Self-pay

## 2018-12-05 ENCOUNTER — Ambulatory Visit
Admission: RE | Admit: 2018-12-05 | Discharge: 2018-12-05 | Disposition: A | Discharge status: Home / Self Care | Payer: BC Managed Care – PPO | Source: Ambulatory Visit | Attending: Radiation Oncology | Admitting: Radiation Oncology

## 2018-12-05 DIAGNOSIS — Z170 Estrogen receptor positive status [ER+]: Secondary | ICD-10-CM | POA: Diagnosis not present

## 2018-12-05 DIAGNOSIS — C50111 Malignant neoplasm of central portion of right female breast: Secondary | ICD-10-CM | POA: Diagnosis not present

## 2018-12-05 DIAGNOSIS — Z510 Encounter for antineoplastic radiation therapy: Secondary | ICD-10-CM | POA: Diagnosis not present

## 2018-12-08 NOTE — Progress Notes (Signed)
[place]  [phone] Fax:(336) [phone]     ID: [name]   DOB: [birth year]  MR#: [id number]  XKG#:[id number]  Patient Care Team: Erica Erp, MD as PCP - General (Internal Medicine) Mullins, Erica Dad, MD as Consulting Physician (Oncology) Erica Fredrickson, MD as Consulting Physician (Psychiatry) Erica Arabian, MD as Consulting Physician (Orthopedic Surgery) Erica Fallen, MD as Consulting Physician (Obstetrics and Gynecology) Erica Pray, MD as Consulting Physician (Radiation Oncology) Erica Kussmaul, MD as Consulting Physician (General Surgery)  OTHER MD: [name]   CHIEF COMPLAINT: Estrogen receptor positive breast cancer in BRCA-positive patient  CURRENT TREATMENT: Adjuvant radiation; [exemestane]   INTERVAL HISTORY: [name] returns today for follow-up and treatment of her contralateral breast cancer. She was last seen here on 11/26/2018.   She continues on adjuvant radiation under Dr. Sondra Mullins.  She is tolerating this remarkably well.  She has good days and not so good days.  On the not so good days she practices bridge on the Internet.  On the good days she walks 2 to 3 miles with her husband had.  Her skin is holding up well.  Since her last visit here, she has not undergone any additional studies.  She is due for repeat mammography at [facility] 03/09/2019   REVIEW OF SYSTEMS: [name] kept her 56-year-old grandchild this past extended weekend and greatly enjoyed it.  She has insomnia problems which are longstanding.  They are no worse than before.  There have been no unusual headaches visual changes cough phlegm production pleurisy shortness of breath or change in bowel or bladder habits.  A detailed review of systems was otherwise stable.   HISTORY OF BREAST CANCER: From the original intake note:   [name] had an unremarkable mammogram at [facility] on December 16, 2006.  Her breasts are dense, and of course that decreases the  sensitivity.  In any case the screening mammogram was read as unremarkable.  In December, however, [name] had her annual examination under [name], and [name] palpated a lump in the left breast at the 3 o'clock position.  The patient was referred back for ultrasound, and this showed a hypoechoic mass measuring 1.4 cm corresponding to the palpable abnormality.  Going back to the September mammogram, [name] was able to tease out what possibly could have been a mammographic finding, but really it was so equivocal, that even in retrospect, the mammogram was not informative.   This was followed up with breast specific gamma imaging on March 11, 2007, and this found a solitary lesion, measuring up to 1.7 cm.  In addition, there was a 1.0 cm low intensity uptake area in the axilla, considered an indeterminate finding.    With this information, the patient proceeded to biopsy under ultrasound guidance of the mass.  This was performed on December 9, and it showed an invasive ductal carcinoma, which was ER positive at 100%, PR positive at 56%, with a borderline/low proliferation marker at 17%, and Hercept test negative at 1+.  [name] was then referred to [name] for further evaluation, and direction for definitive surgery, and she underwent left lumpectomy and sentinel lymph node biopsy March 13, 2007 for what proved to be ([id number]) a 2.1 cm invasive ductal carcinoma with some micro-papillary features, grade 2, with evidence of lymphovascular invasion, but zero of two sentinel lymph nodes involved.  Margins were adequate for both the invasive and in situ components.   Her subsequent history is as detailed below.  PAST  MEDICAL HISTORY: Past Medical History:  Diagnosis Date  . Anxiety   . Anxiety disorder   . BRCA2 positive 07/14/2012  . Cancer [facility]) 2008   left breast  . Dyslipidemia   . Fall   . Goiter   . History of radiation therapy   . Hypothyroidism   . Osteoporosis      PAST SURGICAL HISTORY: Past Surgical History:  Procedure Laterality Date  . ABDOMINAL HYSTERECTOMY  2009   with BSO  . BREAST LUMPECTOMY  [id number], 1991  . BREAST LUMPECTOMY WITH RADIOACTIVE SEED LOCALIZATION Right 10/02/2018   Procedure: RIGHT BREAST LUMPECTOMY WITH RADIOACTIVE SEED LOCALIZATION;  Surgeon: Erica Kussmaul, MD;  Location: [facility];  Service: General;  Laterality: Right;  . COLONOSCOPY N/A 12/13/2015   Procedure: COLONOSCOPY;  Surgeon: Erica Fair, MD;  Location: [facility];  Service: Endoscopy;  Laterality: N/A;  . FRACTURE SURGERY  2015   left hip  . HARDWARE REMOVAL Left 07/08/2015   Procedure: LEFT HIP HARDWARE REMOVAL;  Surgeon: Erica Arabian, MD;  Location: [facility];  Service: Orthopedics;  Laterality: Left;    FAMILY HISTORY (updated OCT 2013) Family History  Problem Relation Age of Onset  . Breast cancer Mother   . Colon cancer Father   . Cancer Maternal Grandmother        unknown primary   The patient's father is currently 20, with a remote history of colon cancer.  The patient's mother died at the age of 32.  She had a history of breast cancer and the patient's mother's mother died from cancer, but the actual original site of cancer is unclear.  The patient has several stepbrothers and sisters, but only one half-sister, currently 28, with no history of cancer.    GYNECOLOGIC HISTORY: She is GX, P2.  First pregnancy age 54.  Last menstrual period age 3.  She did not take hormone replacement.     SOCIAL HISTORY: (updated OCT 2013) Of course she is [name]'s wife. He has 4 children from an earlier marriage. She has two from hers, a son who works at [employer] in [place], and a  son who lives in [place]. They have 3 grandchildren.    ADVANCED DIRECTIVES: in place   HEALTH MAINTENANCE: Social History   Tobacco Use  . Smoking status: Former Smoker    Types: Cigarettes  . Smokeless tobacco: Never Used  Substance Use Topics  .  Alcohol use: Yes    Comment: GLASS OF WINE MOST DAYS  . Drug use: No     Colonoscopy: August 2017/ [name]  PAP: April 2014  Bone density: 2013 AT [facility]/ stable osteoporosis  Lipid panel:  Allergies  Allergen Reactions  . Adhesive [Tape] Rash  . Chloraprep One Step [Chlorhexidine Gluconate] Itching    Skin tears  . Latex Rash  . Betadine [Povidone Iodine]   . Ampicillin Rash    Has patient had a PCN reaction causing immediate rash, facial/tongue/throat swelling, SOB or lightheadedness with hypotension: No Has patient had a PCN reaction causing severe rash involving mucus membranes or skin necrosis: No Has patient had a PCN reaction that required hospitalization No Has patient had a PCN reaction occurring within the last 10 years: No If all of the above answers are "NO", then may proceed with Cephalosporin use.  Clancy Gourd [Nitrofurantoin] Rash    Current Outpatient Medications  Medication Sig Dispense Refill  . alendronate (FOSAMAX) 70 MG tablet Take 70 mg by mouth once  a week.     Marland Kitchen atorvastatin (LIPITOR) 10 MG tablet Take 5 mg by mouth daily.     . Biotin 1 MG CAPS Take by mouth.    . BL EVENING PRIMROSE OIL PO Take by mouth.    . Cholecalciferol (VITAMIN D3) 2000 UNITS TABS Take 1 tablet by mouth daily.     . clorazepate (TRANXENE) 7.5 MG tablet Take 15 mg by mouth at bedtime.     Marland Kitchen exemestane (AROMASIN) 25 MG tablet Take 1 tablet (25 mg total) by mouth daily after breakfast. To start 01/15/2019 90 tablet 4  . HYDROcodone-acetaminophen (NORCO/VICODIN) 5-325 MG tablet Take 1-2 tablets by mouth every 6 (six) hours as needed for moderate pain or severe pain. (Patient not taking: Reported on 10/29/2018) 15 tablet 0  . ibuprofen (ADVIL,MOTRIN) 200 MG tablet Take 400 mg by mouth every 6 (six) hours as needed for mild pain.    Marland Kitchen levothyroxine (SYNTHROID, LEVOTHROID) 125 MCG tablet Take 125 mcg by mouth daily.      . multivitamin-lutein (OCUVITE-LUTEIN) CAPS capsule Take 1  capsule by mouth daily.    . raloxifene (EVISTA) 60 MG tablet TAKE 1 TABLET BY MOUTH ONCE DAILY (Patient not taking: Reported on 10/29/2018) 90 tablet 4  . traZODone (DESYREL) 100 MG tablet Take 100 mg by mouth at bedtime.    . tretinoin (RETIN-A) 0.025 % cream Apply 1 application topically at bedtime.      No current facility-administered medications for this visit.     OBJECTIVE: Middle-aged white woman who appears well  Vitals:   12/09/18 1042  BP: 131/66  Pulse: (!) 58  Resp: 18  Temp: 98.2 F (36.8 C)  SpO2: 98%   Wt Readings from Last 3 Encounters:  12/09/18 140 lb 9.6 oz (63.8 kg)  10/29/18 139 lb 3.2 oz (63.1 kg)  10/02/18 138 lb (62.6 kg)   Body mass index is 24.13 kg/m.    ECOG FS:1 - Symptomatic but completely ambulatory  Ocular: Sclerae unicteric, pupils round and equal Ear-nose-throat: Wearing a mask Lymphatic: No cervical or supraclavicular adenopathy Lungs no rales or rhonchi Heart regular rate and rhythm Abd soft, nontender, positive bowel sounds MSK no focal spinal tenderness, no joint edema Neuro: non-focal, well-oriented, appropriate affect Breasts: The right breast is status post lumpectomy and is currently receiving radiation.  There is a very minimal blush.  There is no desquamation.  The cosmetic result is excellent.  The left breast is status post remote lumpectomy and radiation.  There is no evidence of local recurrence.  Both axillae are benign.  LAB RESULTS: Lab Results  Component Value Date   WBC 4.1 12/09/2018   NEUTROABS 2.7 12/09/2018   HGB 13.1 12/09/2018   HCT 39.0 12/09/2018   MCV 86.9 12/09/2018   PLT 190 12/09/2018      Chemistry      Component Value Date/Time   NA 136 04/14/2018 0934   NA 138 02/15/2016 1229   K 4.3 04/14/2018 0934   K 4.5 02/15/2016 1229   CL 105 04/14/2018 0934   CL 106 07/16/2012 1201   CO2 26 04/14/2018 0934   CO2 26 02/15/2016 1229   BUN 15 04/14/2018 0934   BUN 13.7 02/15/2016 1229   CREATININE  0.99 04/14/2018 0934   CREATININE 0.9 02/15/2016 1229      Component Value Date/Time   CALCIUM 9.3 04/14/2018 0934   CALCIUM 10.4 02/15/2016 1229   ALKPHOS 54 04/14/2018 0934   ALKPHOS 54 02/15/2016 1229  AST 17 04/14/2018 0934   AST 22 02/15/2016 1229   ALT 19 04/14/2018 0934   ALT 25 02/15/2016 1229   BILITOT 0.4 04/14/2018 0934   BILITOT 0.39 02/15/2016 1229       Lab Results  Component Value Date   LABCA2 20 01/09/2012      STUDIES: No results found.   ASSESSMENT: 63 y.o. BRCA2 positive [place] woman   (1)  status post left upper outer quadrant lumpectomy and sentinel lymph node biopsy December 2008 for a T2 N0 invasive ductal carcinoma, grade 2, strongly estrogen and progesterone receptor positive, HER2 negative, with an MIB1 of 17%,  (a) Oncotype DX recurrence score of 20 predicted a 10-year risk of recurrence of 13% after 5 years on tamoxifen  (b) patient had taken tamoxifen for 5 years remotely for breast cancer prevention  (c) started Arimidex April 2009  (d) switched to tamoxifen January 2015  (e) switched to raloxifene January 2019--discontinued June 2020  (2) s/p TAH/BSO with benign pathology in July 2009  (3) on intensified screening, receiving mammography in December and breast MRI in May yearly  (4) right breast biopsy 09/10/2018 shows ductal carcinoma in situ, high-grade  (5) status post right lumpectomy 10/02/2022 a pT1b pNX, stage I invasive ductal carcinoma, grade 2, with negative margins  (a) no sentinel lymph node sampling performed  (6) adjuvant radiation to be completed 12/25/2018  (7) to start exemestane 01/01/2019   PLAN:  [place] is now nearly 12 years out from definitive surgery for her left breast cancer, with no evidence of disease recurrence.  She is currently receiving radiation to her right breast.  She is tolerating that well.  She will complete that 12/25/2018.  On January 01, 2019 she will start exemestane.  Today we  discussed the possible toxicities side effects and complications of this agent.  The plan is to continue that for 5 years.  Note that her osteopenia/osteoporosis problems are followed by Dr. Audelia Hives at [facility] will have her repeat bone densities there.  She remains on alendronate with good tolerance.  Her next mammogram was scheduled at [facility] March 09, 2019.  She will see me a few days later.  She knows to call for any other issues that may develop before then.   Mullins, Erica Dad, MD  12/09/18 10:57 AM Medical Oncology and Hematology [facility] [address] [place], [place] [address] Tel. [phone]    Fax. [phone]  I, Jacqualyn Posey am acting as a Education administrator for Chauncey Cruel, MD.   I, Lurline Del MD, have reviewed the above documentation for accuracy and completeness, and I agree with the above.

## 2018-12-09 ENCOUNTER — Ambulatory Visit
Admission: RE | Admit: 2018-12-09 | Discharge: 2018-12-09 | Disposition: A | Discharge status: Home / Self Care | Payer: BC Managed Care – PPO | Source: Ambulatory Visit | Attending: Radiation Oncology | Admitting: Radiation Oncology

## 2018-12-09 ENCOUNTER — Inpatient Hospital Stay: Payer: BC Managed Care – PPO

## 2018-12-09 ENCOUNTER — Other Ambulatory Visit: Payer: Self-pay

## 2018-12-09 ENCOUNTER — Inpatient Hospital Stay: Payer: BC Managed Care – PPO | Attending: Oncology | Admitting: Oncology

## 2018-12-09 VITALS — BP 131/66 | HR 58 | Temp 98.2°F | Resp 18 | Ht 64.0 in | Wt 140.6 lb

## 2018-12-09 DIAGNOSIS — D0511 Intraductal carcinoma in situ of right breast: Secondary | ICD-10-CM | POA: Insufficient documentation

## 2018-12-09 DIAGNOSIS — Z803 Family history of malignant neoplasm of breast: Secondary | ICD-10-CM | POA: Diagnosis not present

## 2018-12-09 DIAGNOSIS — C50111 Malignant neoplasm of central portion of right female breast: Secondary | ICD-10-CM

## 2018-12-09 DIAGNOSIS — Z170 Estrogen receptor positive status [ER+]: Secondary | ICD-10-CM

## 2018-12-09 DIAGNOSIS — C50412 Malignant neoplasm of upper-outer quadrant of left female breast: Secondary | ICD-10-CM

## 2018-12-09 DIAGNOSIS — E039 Hypothyroidism, unspecified: Secondary | ICD-10-CM | POA: Diagnosis not present

## 2018-12-09 DIAGNOSIS — Z79899 Other long term (current) drug therapy: Secondary | ICD-10-CM | POA: Diagnosis not present

## 2018-12-09 DIAGNOSIS — M810 Age-related osteoporosis without current pathological fracture: Secondary | ICD-10-CM | POA: Diagnosis not present

## 2018-12-09 DIAGNOSIS — Z923 Personal history of irradiation: Secondary | ICD-10-CM | POA: Diagnosis not present

## 2018-12-09 DIAGNOSIS — Z87891 Personal history of nicotine dependence: Secondary | ICD-10-CM | POA: Insufficient documentation

## 2018-12-09 DIAGNOSIS — C50912 Malignant neoplasm of unspecified site of left female breast: Secondary | ICD-10-CM

## 2018-12-09 DIAGNOSIS — Z79811 Long term (current) use of aromatase inhibitors: Secondary | ICD-10-CM | POA: Diagnosis not present

## 2018-12-09 DIAGNOSIS — F419 Anxiety disorder, unspecified: Secondary | ICD-10-CM | POA: Diagnosis not present

## 2018-12-09 DIAGNOSIS — Z800 Family history of malignant neoplasm of digestive organs: Secondary | ICD-10-CM | POA: Insufficient documentation

## 2018-12-09 DIAGNOSIS — Z90722 Acquired absence of ovaries, bilateral: Secondary | ICD-10-CM | POA: Diagnosis not present

## 2018-12-09 DIAGNOSIS — Z1501 Genetic susceptibility to malignant neoplasm of breast: Secondary | ICD-10-CM | POA: Diagnosis not present

## 2018-12-09 DIAGNOSIS — E785 Hyperlipidemia, unspecified: Secondary | ICD-10-CM | POA: Diagnosis not present

## 2018-12-09 DIAGNOSIS — Z853 Personal history of malignant neoplasm of breast: Secondary | ICD-10-CM | POA: Diagnosis not present

## 2018-12-09 DIAGNOSIS — Z90710 Acquired absence of both cervix and uterus: Secondary | ICD-10-CM | POA: Diagnosis not present

## 2018-12-09 DIAGNOSIS — Z510 Encounter for antineoplastic radiation therapy: Secondary | ICD-10-CM | POA: Diagnosis not present

## 2018-12-09 LAB — CBC WITH DIFFERENTIAL/PLATELET
Abs Immature Granulocytes: 0.02 10*3/uL (ref 0.00–0.07)
Basophils Absolute: 0 10*3/uL (ref 0.0–0.1)
Basophils Relative: 1 %
Eosinophils Absolute: 0.1 10*3/uL (ref 0.0–0.5)
Eosinophils Relative: 3 %
HCT: 39 % (ref 36.0–46.0)
Hemoglobin: 13.1 g/dL (ref 12.0–15.0)
Immature Granulocytes: 1 %
Lymphocytes Relative: 22 %
Lymphs Abs: 0.9 10*3/uL (ref 0.7–4.0)
MCH: 29.2 pg (ref 26.0–34.0)
MCHC: 33.6 g/dL (ref 30.0–36.0)
MCV: 86.9 fL (ref 80.0–100.0)
Monocytes Absolute: 0.4 10*3/uL (ref 0.1–1.0)
Monocytes Relative: 9 %
Neutro Abs: 2.7 10*3/uL (ref 1.7–7.7)
Neutrophils Relative %: 64 %
Platelets: 190 10*3/uL (ref 150–400)
RBC: 4.49 MIL/uL (ref 3.87–5.11)
RDW: 12 % (ref 11.5–15.5)
WBC: 4.1 10*3/uL (ref 4.0–10.5)
nRBC: 0 % (ref 0.0–0.2)

## 2018-12-09 LAB — COMPREHENSIVE METABOLIC PANEL
ALT: 28 U/L (ref 0–44)
AST: 19 U/L (ref 15–41)
Albumin: 4.3 g/dL (ref 3.5–5.0)
Alkaline Phosphatase: 55 U/L (ref 38–126)
Anion gap: 9 (ref 5–15)
BUN: 14 mg/dL (ref 8–23)
CO2: 27 mmol/L (ref 22–32)
Calcium: 9.6 mg/dL (ref 8.9–10.3)
Chloride: 105 mmol/L (ref 98–111)
Creatinine, Ser: 0.99 mg/dL (ref 0.44–1.00)
GFR calc Af Amer: >60 mL/min (ref >60)
GFR calc non Af Amer: >60 mL/min (ref >60)
Glucose, Bld: 99 mg/dL (ref 70–99)
Potassium: 4.1 mmol/L (ref 3.5–5.1)
Sodium: 141 mmol/L (ref 135–145)
Total Bilirubin: 0.4 mg/dL (ref 0.3–1.2)
Total Protein: 7.1 g/dL (ref 6.5–8.1)

## 2018-12-10 ENCOUNTER — Telehealth: Payer: Self-pay | Admitting: Oncology

## 2018-12-10 ENCOUNTER — Ambulatory Visit
Admission: RE | Admit: 2018-12-10 | Discharge: 2018-12-10 | Disposition: A | Discharge status: Home / Self Care | Payer: BC Managed Care – PPO | Source: Ambulatory Visit | Attending: Radiation Oncology | Admitting: Radiation Oncology

## 2018-12-10 ENCOUNTER — Other Ambulatory Visit: Payer: Self-pay

## 2018-12-10 DIAGNOSIS — Z510 Encounter for antineoplastic radiation therapy: Secondary | ICD-10-CM | POA: Diagnosis not present

## 2018-12-10 DIAGNOSIS — C50111 Malignant neoplasm of central portion of right female breast: Secondary | ICD-10-CM | POA: Diagnosis not present

## 2018-12-10 DIAGNOSIS — Z170 Estrogen receptor positive status [ER+]: Secondary | ICD-10-CM | POA: Diagnosis not present

## 2018-12-10 LAB — CANCER ANTIGEN 27.29: CA 27.29: 17 U/mL (ref 0.0–38.6)

## 2018-12-10 NOTE — Telephone Encounter (Signed)
I talk with patient regarding 9/10

## 2018-12-11 ENCOUNTER — Ambulatory Visit
Admission: RE | Admit: 2018-12-11 | Discharge: 2018-12-11 | Disposition: A | Discharge status: Home / Self Care | Payer: BC Managed Care – PPO | Source: Ambulatory Visit | Attending: Radiation Oncology | Admitting: Radiation Oncology

## 2018-12-11 ENCOUNTER — Other Ambulatory Visit: Payer: Self-pay

## 2018-12-11 DIAGNOSIS — Z510 Encounter for antineoplastic radiation therapy: Secondary | ICD-10-CM | POA: Diagnosis not present

## 2018-12-11 DIAGNOSIS — C50111 Malignant neoplasm of central portion of right female breast: Secondary | ICD-10-CM | POA: Diagnosis not present

## 2018-12-11 DIAGNOSIS — Z170 Estrogen receptor positive status [ER+]: Secondary | ICD-10-CM | POA: Diagnosis not present

## 2018-12-12 ENCOUNTER — Ambulatory Visit
Admission: RE | Admit: 2018-12-12 | Discharge: 2018-12-12 | Disposition: A | Discharge status: Home / Self Care | Payer: BC Managed Care – PPO | Source: Ambulatory Visit | Attending: Radiation Oncology | Admitting: Radiation Oncology

## 2018-12-12 ENCOUNTER — Other Ambulatory Visit: Payer: Self-pay

## 2018-12-12 DIAGNOSIS — C50111 Malignant neoplasm of central portion of right female breast: Secondary | ICD-10-CM | POA: Diagnosis not present

## 2018-12-12 DIAGNOSIS — Z170 Estrogen receptor positive status [ER+]: Secondary | ICD-10-CM | POA: Diagnosis not present

## 2018-12-12 DIAGNOSIS — Z510 Encounter for antineoplastic radiation therapy: Secondary | ICD-10-CM | POA: Diagnosis not present

## 2018-12-15 ENCOUNTER — Ambulatory Visit
Admission: RE | Admit: 2018-12-15 | Discharge: 2018-12-15 | Disposition: A | Discharge status: Home / Self Care | Payer: BC Managed Care – PPO | Source: Ambulatory Visit | Attending: Radiation Oncology | Admitting: Radiation Oncology

## 2018-12-15 ENCOUNTER — Other Ambulatory Visit: Payer: Self-pay

## 2018-12-15 DIAGNOSIS — C50111 Malignant neoplasm of central portion of right female breast: Secondary | ICD-10-CM | POA: Diagnosis not present

## 2018-12-15 DIAGNOSIS — Z510 Encounter for antineoplastic radiation therapy: Secondary | ICD-10-CM | POA: Diagnosis not present

## 2018-12-15 DIAGNOSIS — Z170 Estrogen receptor positive status [ER+]: Secondary | ICD-10-CM | POA: Diagnosis not present

## 2018-12-16 ENCOUNTER — Ambulatory Visit
Admission: RE | Admit: 2018-12-16 | Discharge: 2018-12-16 | Disposition: A | Discharge status: Home / Self Care | Payer: BC Managed Care – PPO | Source: Ambulatory Visit | Attending: Radiation Oncology | Admitting: Radiation Oncology

## 2018-12-16 DIAGNOSIS — Z170 Estrogen receptor positive status [ER+]: Secondary | ICD-10-CM

## 2018-12-16 DIAGNOSIS — C50111 Malignant neoplasm of central portion of right female breast: Secondary | ICD-10-CM

## 2018-12-16 DIAGNOSIS — Z510 Encounter for antineoplastic radiation therapy: Secondary | ICD-10-CM | POA: Diagnosis not present

## 2018-12-16 NOTE — Progress Notes (Signed)
.  Simulation verification  The patient was brought to the treatment machine and placed in the plan treatment position.  Clinical set up was verified to ensure that the target region is appropriately covered for the patient's upcoming electron boost treatment.  The targeted volume of tissue is appropriately covered by the radiation field.  Based on my personal review, I approve the simulation verification.  The patient's treatment will proceed as planned.  ------------------------------------------------  -----------------------------------  Shalini Mair D. Keiston Manley, PhD, MD  

## 2018-12-17 ENCOUNTER — Ambulatory Visit: Payer: BC Managed Care – PPO

## 2018-12-17 ENCOUNTER — Other Ambulatory Visit: Payer: Self-pay

## 2018-12-17 DIAGNOSIS — Z170 Estrogen receptor positive status [ER+]: Secondary | ICD-10-CM | POA: Diagnosis not present

## 2018-12-17 DIAGNOSIS — Z510 Encounter for antineoplastic radiation therapy: Secondary | ICD-10-CM | POA: Diagnosis not present

## 2018-12-17 DIAGNOSIS — C50812 Malignant neoplasm of overlapping sites of left female breast: Secondary | ICD-10-CM | POA: Diagnosis not present

## 2018-12-17 DIAGNOSIS — M810 Age-related osteoporosis without current pathological fracture: Secondary | ICD-10-CM | POA: Diagnosis not present

## 2018-12-17 DIAGNOSIS — C50811 Malignant neoplasm of overlapping sites of right female breast: Secondary | ICD-10-CM | POA: Diagnosis not present

## 2018-12-17 DIAGNOSIS — S72002E Fracture of unspecified part of neck of left femur, subsequent encounter for open fracture type I or II with routine healing: Secondary | ICD-10-CM | POA: Diagnosis not present

## 2018-12-17 DIAGNOSIS — C50111 Malignant neoplasm of central portion of right female breast: Secondary | ICD-10-CM | POA: Diagnosis not present

## 2018-12-17 DIAGNOSIS — Z6824 Body mass index (BMI) 24.0-24.9, adult: Secondary | ICD-10-CM | POA: Diagnosis not present

## 2018-12-18 ENCOUNTER — Ambulatory Visit: Payer: BC Managed Care – PPO

## 2018-12-18 DIAGNOSIS — Z170 Estrogen receptor positive status [ER+]: Secondary | ICD-10-CM | POA: Diagnosis not present

## 2018-12-18 DIAGNOSIS — C50111 Malignant neoplasm of central portion of right female breast: Secondary | ICD-10-CM | POA: Diagnosis not present

## 2018-12-18 DIAGNOSIS — Z510 Encounter for antineoplastic radiation therapy: Secondary | ICD-10-CM | POA: Diagnosis not present

## 2018-12-19 ENCOUNTER — Other Ambulatory Visit: Payer: Self-pay

## 2018-12-19 ENCOUNTER — Ambulatory Visit
Admission: RE | Admit: 2018-12-19 | Discharge: 2018-12-19 | Disposition: A | Discharge status: Home / Self Care | Payer: BC Managed Care – PPO | Source: Ambulatory Visit | Attending: Radiation Oncology | Admitting: Radiation Oncology

## 2018-12-19 DIAGNOSIS — Z510 Encounter for antineoplastic radiation therapy: Secondary | ICD-10-CM | POA: Diagnosis not present

## 2018-12-19 DIAGNOSIS — Z170 Estrogen receptor positive status [ER+]: Secondary | ICD-10-CM | POA: Diagnosis not present

## 2018-12-19 DIAGNOSIS — C50111 Malignant neoplasm of central portion of right female breast: Secondary | ICD-10-CM | POA: Diagnosis not present

## 2018-12-22 ENCOUNTER — Ambulatory Visit
Admission: RE | Admit: 2018-12-22 | Discharge: 2018-12-22 | Disposition: A | Discharge status: Home / Self Care | Payer: BC Managed Care – PPO | Source: Ambulatory Visit | Attending: Radiation Oncology | Admitting: Radiation Oncology

## 2018-12-22 ENCOUNTER — Other Ambulatory Visit: Payer: Self-pay

## 2018-12-22 DIAGNOSIS — Z510 Encounter for antineoplastic radiation therapy: Secondary | ICD-10-CM | POA: Diagnosis not present

## 2018-12-22 DIAGNOSIS — Z170 Estrogen receptor positive status [ER+]: Secondary | ICD-10-CM | POA: Diagnosis not present

## 2018-12-22 DIAGNOSIS — C50111 Malignant neoplasm of central portion of right female breast: Secondary | ICD-10-CM | POA: Diagnosis not present

## 2018-12-23 ENCOUNTER — Ambulatory Visit
Admission: RE | Admit: 2018-12-23 | Discharge: 2018-12-23 | Disposition: A | Discharge status: Home / Self Care | Payer: BC Managed Care – PPO | Source: Ambulatory Visit | Attending: Radiation Oncology | Admitting: Radiation Oncology

## 2018-12-23 ENCOUNTER — Other Ambulatory Visit: Payer: Self-pay

## 2018-12-23 DIAGNOSIS — C50111 Malignant neoplasm of central portion of right female breast: Secondary | ICD-10-CM | POA: Diagnosis not present

## 2018-12-23 DIAGNOSIS — Z170 Estrogen receptor positive status [ER+]: Secondary | ICD-10-CM | POA: Diagnosis not present

## 2018-12-23 DIAGNOSIS — Z510 Encounter for antineoplastic radiation therapy: Secondary | ICD-10-CM | POA: Diagnosis not present

## 2018-12-24 ENCOUNTER — Ambulatory Visit
Admission: RE | Admit: 2018-12-24 | Discharge: 2018-12-24 | Disposition: A | Discharge status: Home / Self Care | Payer: BC Managed Care – PPO | Source: Ambulatory Visit | Attending: Radiation Oncology | Admitting: Radiation Oncology

## 2018-12-24 ENCOUNTER — Other Ambulatory Visit: Payer: Self-pay

## 2018-12-24 DIAGNOSIS — Z510 Encounter for antineoplastic radiation therapy: Secondary | ICD-10-CM | POA: Diagnosis not present

## 2018-12-24 DIAGNOSIS — C50111 Malignant neoplasm of central portion of right female breast: Secondary | ICD-10-CM | POA: Diagnosis not present

## 2018-12-24 DIAGNOSIS — Z170 Estrogen receptor positive status [ER+]: Secondary | ICD-10-CM | POA: Diagnosis not present

## 2018-12-25 ENCOUNTER — Ambulatory Visit
Admission: RE | Admit: 2018-12-25 | Discharge: 2018-12-25 | Disposition: A | Discharge status: Home / Self Care | Payer: BC Managed Care – PPO | Source: Ambulatory Visit | Attending: Radiation Oncology | Admitting: Radiation Oncology

## 2018-12-25 ENCOUNTER — Encounter: Payer: Self-pay | Admitting: *Deleted

## 2018-12-25 ENCOUNTER — Other Ambulatory Visit: Payer: Self-pay

## 2018-12-25 ENCOUNTER — Encounter: Payer: Self-pay | Admitting: Radiation Oncology

## 2018-12-25 DIAGNOSIS — Z510 Encounter for antineoplastic radiation therapy: Secondary | ICD-10-CM | POA: Diagnosis not present

## 2018-12-25 DIAGNOSIS — C50111 Malignant neoplasm of central portion of right female breast: Secondary | ICD-10-CM | POA: Diagnosis not present

## 2018-12-25 DIAGNOSIS — Z170 Estrogen receptor positive status [ER+]: Secondary | ICD-10-CM | POA: Diagnosis not present

## 2018-12-30 DIAGNOSIS — B079 Viral wart, unspecified: Secondary | ICD-10-CM | POA: Diagnosis not present

## 2018-12-30 DIAGNOSIS — M7591 Shoulder lesion, unspecified, right shoulder: Secondary | ICD-10-CM | POA: Diagnosis not present

## 2018-12-30 DIAGNOSIS — D485 Neoplasm of uncertain behavior of skin: Secondary | ICD-10-CM | POA: Diagnosis not present

## 2018-12-30 DIAGNOSIS — C50911 Malignant neoplasm of unspecified site of right female breast: Secondary | ICD-10-CM | POA: Diagnosis not present

## 2018-12-30 DIAGNOSIS — R238 Other skin changes: Secondary | ICD-10-CM | POA: Diagnosis not present

## 2019-02-02 ENCOUNTER — Ambulatory Visit: Payer: Self-pay | Admitting: Radiation Oncology

## 2019-02-05 ENCOUNTER — Ambulatory Visit: Payer: BC Managed Care – PPO | Admitting: Radiation Oncology

## 2019-02-05 DIAGNOSIS — M810 Age-related osteoporosis without current pathological fracture: Secondary | ICD-10-CM | POA: Diagnosis not present

## 2019-02-09 ENCOUNTER — Encounter: Payer: Self-pay | Admitting: Radiation Oncology

## 2019-02-09 ENCOUNTER — Ambulatory Visit
Admission: RE | Admit: 2019-02-09 | Discharge: 2019-02-09 | Disposition: A | Discharge status: Home / Self Care | Payer: BC Managed Care – PPO | Source: Ambulatory Visit | Attending: Radiation Oncology | Admitting: Radiation Oncology

## 2019-02-09 ENCOUNTER — Other Ambulatory Visit: Payer: Self-pay

## 2019-02-09 VITALS — BP 97/80 | HR 58 | Temp 98.0°F | Resp 18 | Ht 64.0 in | Wt 143.4 lb

## 2019-02-09 DIAGNOSIS — Z923 Personal history of irradiation: Secondary | ICD-10-CM | POA: Diagnosis not present

## 2019-02-09 DIAGNOSIS — C50111 Malignant neoplasm of central portion of right female breast: Secondary | ICD-10-CM

## 2019-02-09 DIAGNOSIS — Z79899 Other long term (current) drug therapy: Secondary | ICD-10-CM | POA: Diagnosis not present

## 2019-02-09 DIAGNOSIS — Z170 Estrogen receptor positive status [ER+]: Secondary | ICD-10-CM | POA: Insufficient documentation

## 2019-02-09 DIAGNOSIS — Z79811 Long term (current) use of aromatase inhibitors: Secondary | ICD-10-CM | POA: Insufficient documentation

## 2019-02-09 DIAGNOSIS — M25552 Pain in left hip: Secondary | ICD-10-CM | POA: Insufficient documentation

## 2019-02-09 NOTE — Progress Notes (Signed)
Radiation Oncology         [phone] ________________________________  Name: [name] MRN: [id number]  Date: 02/09/2019  DOB: 1955/06/21  Follow-Up Visit Note  CC: Levin Erp, MD  Magrinat, Virgie Dad, MD    ICD-10-CM   1. Malignant neoplasm of central portion of right breast in female, estrogen receptor positive ([facility])  C50.111    Z17.0     Diagnosis: Stage IA (pT1b, pNX), central right breast invasive ductal carcinoma, ER/PR+, HER2-, grade 2  Interval Since Last Radiation: One month and two weeks.   /09/2018 through 12/25/2018 Site Technique Total Dose Dose per Fx Completed Fx Beam Energies  Breast: Breast_Rt 3D 50.4/50.4 1.8 28/28 6X  Breast: Breast_Rt_axilla 3D 45/45 1.8 25/25 6X, 10X  Breast: Breast_Rt_Bst specialPort 12/12 2 6/6 9E, 12E    Narrative:  The patient returns today for routine follow-up. Radiation-associated fatigue has resolved.   On review of systems, she reports mild pain in left hip. Denies pain in either breast.  She denies any nipple discharge or bleeding or swelling in either arm.  ALLERGIES:  is allergic to adhesive [tape]; chloraprep one step [chlorhexidine gluconate]; latex; betadine [povidone iodine]; ampicillin; and macrodantin [nitrofurantoin].  Meds: Current Outpatient Medications  Medication Sig Dispense Refill  . atorvastatin (LIPITOR) 10 MG tablet Take 5 mg by mouth daily.     . Biotin 1 MG CAPS Take by mouth.    . BL EVENING PRIMROSE OIL PO Take by mouth.    . Cholecalciferol (VITAMIN D3) 2000 UNITS TABS Take 1 tablet by mouth daily.     . clorazepate (TRANXENE) 7.5 MG tablet Take 15 mg by mouth at bedtime.     Marland Kitchen exemestane (AROMASIN) 25 MG tablet Take 1 tablet (25 mg total) by mouth daily after breakfast. To start 01/15/2019 90 tablet 4  . ibuprofen (ADVIL,MOTRIN) 200 MG tablet Take 400 mg by mouth every 6 (six) hours as needed for mild pain.    Marland Kitchen levothyroxine (SYNTHROID, LEVOTHROID) 125 MCG tablet Take 125 mcg by mouth daily.       . multivitamin-lutein (OCUVITE-LUTEIN) CAPS capsule Take 1 capsule by mouth daily.    . Romosozumab-aqqg (EVENITY [place]) Inject into the skin.    Marland Kitchen traZODone (DESYREL) 100 MG tablet Take 100 mg by mouth at bedtime.    Marland Kitchen alendronate (FOSAMAX) 70 MG tablet Take 70 mg by mouth once a week.     Marland Kitchen HYDROcodone-acetaminophen (NORCO/VICODIN) 5-325 MG tablet Take 1-2 tablets by mouth every 6 (six) hours as needed for moderate pain or severe pain. (Patient not taking: Reported on 10/29/2018) 15 tablet 0  . raloxifene (EVISTA) 60 MG tablet TAKE 1 TABLET BY MOUTH ONCE DAILY (Patient not taking: Reported on 10/29/2018) 90 tablet 4  . tretinoin (RETIN-A) 0.025 % cream Apply 1 application topically at bedtime.      No current facility-administered medications for this encounter.     Physical Findings: The patient is in no acute distress. Patient is alert and oriented. Lungs are clear to auscultation bilaterally. Heart has regular rate and rhythm. No palpable cervical, supraclavicular, or axillary adenopathy. Abdomen soft, non-tender, normal bowel sounds.  Examination of the left breast reveals no palpable mass nipple discharge or bleeding.  Examination of the right breast reveals the skin to be well-healed.  No dominant mass appreciated no nipple discharge or bleeding.  Excellent cosmetic result.   height is 5' 4"  (1.626 m) and weight is 143 lb 6 oz (65 kg). Her temporal temperature is 98  F (36.7 C). Her blood pressure is 97/80 and her pulse is 58 (abnormal). Her respiration is 18 and oxygen saturation is 100%. .  Lab Findings: Lab Results  Component Value Date   WBC 4.1 12/09/2018   HGB 13.1 12/09/2018   HCT 39.0 12/09/2018   MCV 86.9 12/09/2018   PLT 190 12/09/2018    Radiographic Findings: No results found.  Impression:  The patient has recovered from the effects of radiation therapy.  No signs of recurrence on clinical exam today.  She is tolerating estramustine well at this time.  Plan: As  needed follow-up in radiation oncology.  Patient will continue close follow-up with medical oncology.  ____________________________________ Gery Pray, MD    This document serves as a record of services personally performed by Gery Pray, MD. It was created on his behalf by Clerance Lav, a trained medical scribe. The creation of this record is based on the scribe's personal observations and the provider's statements to them. This document has been checked and approved by the attending provider.

## 2019-02-09 NOTE — Progress Notes (Incomplete)
Patient Name: [name] MRN: [id number] DOB: 03/16/1956 Referring Physician: Lurline Del (Profile Not Attached) Date of Service: 12/25/2018 [facility], [place]                                                        End Of Treatment Note  Diagnoses: C50.111-Malignant neoplasm of central portion of right female breast  Cancer Staging: Stage IA (pT1b, pN0) Invasive Ductal Carcinoma, ER+ Ysidro Evert /Her2-, Grade 2  Intent: Curative  Radiation Treatment Dates: 11/06/2018 through 12/25/2018 Site Technique Total Dose Dose per Fx Completed Fx Beam Energies  Breast: Breast_Rt 3D 50.4/50.4 1.8 28/28 6X  Breast: Breast_Rt_axilla 3D 45/45 1.8 25/25 6X, 10X  Breast: Breast_Rt_Bst specialPort 12/12 2 6/6 9E, 12E   Narrative: The patient tolerated radiation therapy relatively well. She reported mild to moderate fatigue. She experienced mild to moderate hyperpigmentation and erythema without skin breakdown. She denied pain.  Plan: The patient will follow-up with radiation oncology in 1 month.  ________________________________________________   Blair Promise, PhD, MD  This document serves as a record of services personally performed by Gery Pray, MD. It was created on his behalf by Wilburn Mylar, a trained medical scribe. The creation of this record is based on the scribe's personal observations and the provider's statements to them. This document has been checked and approved by the attending provider.

## 2019-02-09 NOTE — Progress Notes (Signed)
Pt presents today for f/u with Dr. Sondra Come. Pt reports radiation-associated fatigue has resolved. Pt denies c/o pain in breast, does report pain in LEFT hip, rated 4/10. Pt is not using any moisturizer on breast. Breast with slight hyperpigmentation in axilla.   BP 97/80 (BP Location: Left Arm, Patient Position: Standing)   Pulse (!) 58   Temp 98 F (36.7 C) (Temporal)   Resp 18   Ht 5\' 4"  (1.626 m)   Wt 143 lb 6 oz (65 kg)   SpO2 100%   BMI 24.61 kg/m   Wt Readings from Last 3 Encounters:  02/09/19 143 lb 6 oz (65 kg)  12/09/18 140 lb 9.6 oz (63.8 kg)  10/29/18 139 lb 3.2 oz (63.1 kg)   Loma Sousa, RN BSN

## 2019-02-09 NOTE — Patient Instructions (Signed)
Coronavirus (COVID-19) Are you at risk?  Are you at risk for the Coronavirus (COVID-19)?  To be considered HIGH RISK for Coronavirus (COVID-19), you have to meet the following criteria:  . Traveled to [place], [place], [place], [place] or [place]; or in the [place] to [place], [place], [place], or [place]; and have fever, cough, and shortness of breath within the last 2 weeks of travel OR . Been in close contact with a person diagnosed with COVID-19 within the last 2 weeks and have fever, cough, and shortness of breath . IF YOU DO NOT MEET THESE CRITERIA, YOU ARE CONSIDERED LOW RISK FOR COVID-19.  What to do if you are HIGH RISK for COVID-19?  . If you are having a medical emergency, call 911. . Seek medical care right away. Before you go to a doctor's office, urgent care or emergency department, call ahead and tell them about your recent travel, contact with someone diagnosed with COVID-19, and your symptoms. You should receive instructions from your physician's office regarding next steps of care.  . When you arrive at healthcare provider, tell the healthcare staff immediately you have returned from visiting [place], [place], [place], [place] or [place]; or traveled in the [place] to [place], [place], [place], or [place]; in the last two weeks or you have been in close contact with a person diagnosed with COVID-19 in the last 2 weeks.   . Tell the health care staff about your symptoms: fever, cough and shortness of breath. . After you have been seen by a medical provider, you will be either: o Tested for (COVID-19) and discharged home on quarantine except to seek medical care if symptoms worsen, and asked to  - Stay home and avoid contact with others until you get your results (4-5 days)  - Avoid travel on public transportation if possible (such as bus, train, or airplane) or o Sent to the Emergency Department by EMS for evaluation, COVID-19 testing, and possible  admission depending on your condition and test results.  What to do if you are LOW RISK for COVID-19?  Reduce your risk of any infection by using the same precautions used for avoiding the common cold or flu:  . Wash your hands often with soap and warm water for at least 20 seconds.  If soap and water are not readily available, use an alcohol-based hand sanitizer with at least 60% alcohol.  . If coughing or sneezing, cover your mouth and nose by coughing or sneezing into the elbow areas of your shirt or coat, into a tissue or into your sleeve (not your hands). . Avoid shaking hands with others and consider head nods or verbal greetings only. . Avoid touching your eyes, nose, or mouth with unwashed hands.  . Avoid close contact with people who are sick. . Avoid places or events with large numbers of people in one location, like concerts or sporting events. . Carefully consider travel plans you have or are making. . If you are planning any travel outside or inside the [place], visit the CDC's Travelers' Health webpage for the latest health notices. . If you have some symptoms but not all symptoms, continue to monitor at home and seek medical attention if your symptoms worsen. . If you are having a medical emergency, call 911.   ADDITIONAL HEALTHCARE OPTIONS FOR PATIENTS  [place] Telehealth / e-Visit: [email or web address]         [facility]: [phone]  [facility]: [phone]                   [facility]: [phone]   

## 2019-03-05 DIAGNOSIS — M810 Age-related osteoporosis without current pathological fracture: Secondary | ICD-10-CM | POA: Diagnosis not present

## 2019-03-09 DIAGNOSIS — Z853 Personal history of malignant neoplasm of breast: Secondary | ICD-10-CM | POA: Diagnosis not present

## 2019-03-10 DIAGNOSIS — F4322 Adjustment disorder with anxiety: Secondary | ICD-10-CM | POA: Diagnosis not present

## 2019-03-11 NOTE — Progress Notes (Signed)
[place]  Telephone:([phone] Fax:([phone]) [phone]     ID: [name]   DOB: 12/18/1955  MR#: [id number]  YYQ#:[id number]  Patient Care Team: Levin Erp, MD as PCP - General (Internal Medicine) , Virgie Dad, MD as Consulting Physician (Oncology) Norma Fredrickson, MD as Consulting Physician (Psychiatry) [name], MD as Consulting Physician (Orthopedic Surgery) Azucena Fallen, MD as Consulting Physician (Obstetrics and Gynecology) Gery Pray, MD as Consulting Physician (Radiation Oncology) Jovita Kussmaul, MD as Consulting Physician (General Surgery)  OTHER MD: Manon Hilding   CHIEF COMPLAINT: Estrogen receptor positive breast cancer in BRCA-positive patient  CURRENT TREATMENT: exemestane   INTERVAL HISTORY: [name] returns today for follow-up of her contralateral breast cancer.   She completed adjuvant radiation under Dr. Sondra Come on 12/25/2018. She tolerated this relatively well with minimal side effects.  She started on examestane on 01/01/2019.  She is tolerating this remarkably well, with no hot flashes at all and no problems with vaginal dryness.  Since her last visit, she underwent bilateral diagnostic mammography with tomography at [facility] on 03/09/2019 showing: breast density category C; no evidence of malignancy in either breast.   REVIEW OF SYSTEMS: [name] is exercising regularly.  She has had no unusual headaches visual changes cough phlegm production pleurisy shortness of breath or change in bowel or bladder habits.  She is taking appropriate pandemic precautions.  A detailed review of systems was otherwise stable.   HISTORY OF BREAST CANCER: From the original intake note:   [name] had an unremarkable mammogram at [facility] on December 16, 2006.  Her breasts are dense, and of course that decreases the sensitivity.  In any case the screening mammogram was read as unremarkable.  In December, however, Ryliee had her annual examination  under [name], and Dr. Irven Baltimore palpated a lump in the left breast at the 3 o'clock position.  The patient was referred back for ultrasound, and this showed a hypoechoic mass measuring 1.4 cm corresponding to the palpable abnormality.  Going back to the September mammogram, Dr. [name] was able to tease out what possibly could have been a mammographic finding, but really it was so equivocal, that even in retrospect, the mammogram was not informative.   This was followed up with breast specific gamma imaging on March 11, 2007, and this found a solitary lesion, measuring up to 1.7 cm.  In addition, there was a 1.0 cm low intensity uptake area in the axilla, considered an indeterminate finding.    With this information, the patient proceeded to biopsy under ultrasound guidance of the mass.  This was performed on December 9, and it showed an invasive ductal carcinoma, which was ER positive at 100%, PR positive at 56%, with a borderline/low proliferation marker at 17%, and Hercept test negative at 1+.  [name] was then referred to Dr. Hassell Done for further evaluation, and direction for definitive surgery, and she underwent left lumpectomy and sentinel lymph node biopsy March 13, 2007 for what proved to be ([id number]) a 2.1 cm invasive ductal carcinoma with some micro-papillary features, grade 2, with evidence of lymphovascular invasion, but zero of two sentinel lymph nodes involved.  Margins were adequate for both the invasive and in situ components.   Her subsequent history is as detailed below.   PAST MEDICAL HISTORY: Past Medical History:  Diagnosis Date  . Anxiety   . Anxiety disorder   . BRCA2 positive 07/14/2012  . Cancer [facility]) 2008   left breast  . Dyslipidemia   . Fall   .  Goiter   . History of radiation therapy   . Hypothyroidism   . Osteoporosis     PAST SURGICAL HISTORY: Past Surgical History:  Procedure Laterality Date  . ABDOMINAL HYSTERECTOMY  2009   with BSO  .  BREAST LUMPECTOMY  [id number], 1991  . BREAST LUMPECTOMY WITH RADIOACTIVE SEED LOCALIZATION Right 10/02/2018   Procedure: RIGHT BREAST LUMPECTOMY WITH RADIOACTIVE SEED LOCALIZATION;  Surgeon: Jovita Kussmaul, MD;  Location: [facility];  Service: General;  Laterality: Right;  . COLONOSCOPY N/A 12/13/2015   Procedure: COLONOSCOPY;  Surgeon: [name], MD;  Location: [facility];  Service: Endoscopy;  Laterality: N/A;  . FRACTURE SURGERY  2015   left hip  . HARDWARE REMOVAL Left 07/08/2015   Procedure: LEFT HIP HARDWARE REMOVAL;  Surgeon: [name], MD;  Location: [facility];  Service: Orthopedics;  Laterality: Left;    FAMILY HISTORY (updated OCT 2013) Family History  Problem Relation Age of Onset  . Breast cancer Mother   . Colon cancer Father   . Cancer Maternal Grandmother        unknown primary   The patient's father is currently 5, with a remote history of colon cancer.  The patient's mother died at the age of 30.  She had a history of breast cancer and the patient's mother's mother died from cancer, but the actual original site of cancer is unclear.  The patient has several stepbrothers and sisters, but only one half-sister, currently 63, with no history of cancer.    GYNECOLOGIC HISTORY: She is GX, P2.  First pregnancy age 5.  Last menstrual period age 23.  She did not take hormone replacement.     SOCIAL HISTORY: (updated OCT 2013) Of course she is [name]. He has 4 children from an earlier marriage. She has two from hers, a son who works at [employer] in [place], and a  son who lives in [place]. They have 3 grandchildren.    ADVANCED DIRECTIVES: in place   HEALTH MAINTENANCE: Social History   Tobacco Use  . Smoking status: Former Smoker    Types: Cigarettes  . Smokeless tobacco: Never Used  Substance Use Topics  . Alcohol use: Yes    Comment: GLASS OF WINE MOST DAYS  . Drug use: No     Colonoscopy: August 2017/ [facility]os  PAP: April  2014  Bone density: 2013 AT [facility]/ stable osteoporosis  Lipid panel:  Allergies  Allergen Reactions  . Adhesive [Tape] Rash  . Chloraprep One Step [Chlorhexidine Gluconate] Itching    Skin tears  . Latex Rash  . Betadine [Povidone Iodine]   . Ampicillin Rash    Has patient had a PCN reaction causing immediate rash, facial/tongue/throat swelling, SOB or lightheadedness with hypotension: No Has patient had a PCN reaction causing severe rash involving mucus membranes or skin necrosis: No Has patient had a PCN reaction that required hospitalization No Has patient had a PCN reaction occurring within the last 10 years: No If all of the above answers are "NO", then may proceed with Cephalosporin use.  Clancy Gourd [Nitrofurantoin] Rash    Current Outpatient Medications  Medication Sig Dispense Refill  . alendronate (FOSAMAX) 70 MG tablet Take 70 mg by mouth once a week.     Marland Kitchen atorvastatin (LIPITOR) 10 MG tablet Take 5 mg by mouth daily.     . Biotin 1 MG CAPS Take by mouth.    . BL EVENING PRIMROSE OIL PO Take by  mouth.    . Cholecalciferol (VITAMIN D3) 2000 UNITS TABS Take 1 tablet by mouth daily.     . clorazepate (TRANXENE) 7.5 MG tablet Take 15 mg by mouth at bedtime.     Marland Kitchen exemestane (AROMASIN) 25 MG tablet Take 1 tablet (25 mg total) by mouth daily after breakfast. To start 01/15/2019 90 tablet 4  . HYDROcodone-acetaminophen (NORCO/VICODIN) 5-325 MG tablet Take 1-2 tablets by mouth every 6 (six) hours as needed for moderate pain or severe pain. (Patient not taking: Reported on 10/29/2018) 15 tablet 0  . ibuprofen (ADVIL,MOTRIN) 200 MG tablet Take 400 mg by mouth every 6 (six) hours as needed for mild pain.    Marland Kitchen levothyroxine (SYNTHROID, LEVOTHROID) 125 MCG tablet Take 125 mcg by mouth daily.      . multivitamin-lutein (OCUVITE-LUTEIN) CAPS capsule Take 1 capsule by mouth daily.    . raloxifene (EVISTA) 60 MG tablet TAKE 1 TABLET BY MOUTH ONCE DAILY (Patient not taking: Reported on  10/29/2018) 90 tablet 4  . Romosozumab-aqqg (EVENITY [place]) Inject into the skin.    Marland Kitchen traZODone (DESYREL) 100 MG tablet Take 100 mg by mouth at bedtime.    . tretinoin (RETIN-A) 0.025 % cream Apply 1 application topically at bedtime.      No current facility-administered medications for this visit.    OBJECTIVE: Middle-aged white woman in no acute distress  Vitals:   03/12/19 1041  BP: 114/63  Pulse: (!) 59  Resp: 18  Temp: 98.4 F (36.9 C)  SpO2: 100%   Wt Readings from Last 3 Encounters:  03/12/19 142 lb 9.6 oz (64.7 kg)  02/09/19 143 lb 6 oz (65 kg)  12/09/18 140 lb 9.6 oz (63.8 kg)   Body mass index is 24.48 kg/m.    ECOG FS:1 - Symptomatic but completely ambulatory  Sclerae unicteric, EOMs intact Wearing a mask No cervical or supraclavicular adenopathy Lungs no rales or rhonchi Heart regular rate and rhythm Abd soft, nontender, positive bowel sounds MSK no focal spinal tenderness, no upper extremity lymphedema Neuro: nonfocal, well oriented, appropriate affect Breasts: The right breast is status post lumpectomy and radiation, with no palpable abnormality and no significant hyperpigmentation.  The left breast is status post remote lumpectomy and radiation with no evidence of local recurrence.  Both axillae are benign.   LAB RESULTS: Lab Results  Component Value Date   WBC 5.2 03/12/2019   NEUTROABS 3.2 03/12/2019   HGB 13.1 03/12/2019   HCT 40.5 03/12/2019   MCV 87.9 03/12/2019   PLT 228 03/12/2019      Chemistry      Component Value Date/Time   NA 139 03/12/2019 1021   NA 138 02/15/2016 1229   K 4.4 03/12/2019 1021   K 4.5 02/15/2016 1229   CL 104 03/12/2019 1021   CL 106 07/16/2012 1201   CO2 27 03/12/2019 1021   CO2 26 02/15/2016 1229   BUN 18 03/12/2019 1021   BUN 13.7 02/15/2016 1229   CREATININE 1.03 (H) 03/12/2019 1021   CREATININE 0.9 02/15/2016 1229      Component Value Date/Time   CALCIUM 9.4 03/12/2019 1021   CALCIUM 10.4 02/15/2016  1229   ALKPHOS 83 03/12/2019 1021   ALKPHOS 54 02/15/2016 1229   AST 34 03/12/2019 1021   AST 22 02/15/2016 1229   ALT 44 03/12/2019 1021   ALT 25 02/15/2016 1229   BILITOT 0.4 03/12/2019 1021   BILITOT 0.39 02/15/2016 1229      Lab Results  Component  Value Date   LABCA2 20 01/09/2012     STUDIES: No results found.   ASSESSMENT: 63 y.o. BRCA2 positive [place] woman   (1)  status post left upper outer quadrant lumpectomy and sentinel lymph node biopsy December 2008 for a T2 N0 invasive ductal carcinoma, grade 2, strongly estrogen and progesterone receptor positive, HER2 negative, with an MIB1 of 17%,  (a) Oncotype DX recurrence score of 20 predicted a 10-year risk of recurrence of 13% after 5 years on tamoxifen  (b) patient had taken tamoxifen for 5 years remotely for breast cancer prevention  (c) started Arimidex April 2009  (d) switched to tamoxifen January 2015  (e) switched to raloxifene January 2019--discontinued June 2020  (2) s/p TAH/BSO with benign pathology in July 2009  (3) on intensified screening, receiving mammography in December and breast MRI in May yearly  (4) right breast biopsy 09/10/2018 shows ductal carcinoma in situ, high-grade  (5) status post right lumpectomy 10/02/2022 a pT1b pNX, stage I invasive ductal carcinoma, grade 2, with negative margins  (a) no sentinel lymph node sampling performed  (6) adjuvant radiation 11/06/2018 - 12/25/2018  (a) right breast / 50.4 Gy in 28 fractions  (b) right axilla / 45 Gy in 25 fractions  (c) right breast boost / 12 Gy in 6 fractions  (7) started exemestane 01/01/2019   PLAN:  Drema is now 12 years out from definitive surgery for her right breast cancer and 5 months out from definitive surgery for her left breast cancer.  She is recovered very nicely from radiation and is tolerating exemestane well.  The plan is to continue exemestane a minimum of 5 years but more likely indefinitely.  I will add a  bone density to her mammography a year from now  Otherwise we are resuming intensified screening.  She will have a breast MRI in June and then repeat mammography in December.  She will see me right after the December mammogram.  She is taking appropriate pandemic precautions  She knows to call for any other issue that may develop before the next visit.     , Virgie Dad, MD  03/12/19 11:09 AM Medical Oncology and Hematology [facility], [place] [address] Tel. [phone]    Fax. [phone]   I, Wilburn Mylar, am acting as scribe for Dr. Virgie Dad. .  I, Lurline Del MD, have reviewed the above documentation for accuracy and completeness, and I agree with the above.

## 2019-03-12 ENCOUNTER — Other Ambulatory Visit: Payer: Self-pay

## 2019-03-12 ENCOUNTER — Inpatient Hospital Stay: Payer: BC Managed Care – PPO | Attending: Oncology | Admitting: Oncology

## 2019-03-12 ENCOUNTER — Inpatient Hospital Stay: Payer: BC Managed Care – PPO

## 2019-03-12 VITALS — BP 114/63 | HR 59 | Temp 98.4°F | Resp 18 | Ht 64.0 in | Wt 142.6 lb

## 2019-03-12 DIAGNOSIS — Z1501 Genetic susceptibility to malignant neoplasm of breast: Secondary | ICD-10-CM

## 2019-03-12 DIAGNOSIS — Z923 Personal history of irradiation: Secondary | ICD-10-CM | POA: Diagnosis not present

## 2019-03-12 DIAGNOSIS — C50912 Malignant neoplasm of unspecified site of left female breast: Secondary | ICD-10-CM

## 2019-03-12 DIAGNOSIS — Z800 Family history of malignant neoplasm of digestive organs: Secondary | ICD-10-CM | POA: Diagnosis not present

## 2019-03-12 DIAGNOSIS — F419 Anxiety disorder, unspecified: Secondary | ICD-10-CM | POA: Insufficient documentation

## 2019-03-12 DIAGNOSIS — D0511 Intraductal carcinoma in situ of right breast: Secondary | ICD-10-CM | POA: Diagnosis not present

## 2019-03-12 DIAGNOSIS — M810 Age-related osteoporosis without current pathological fracture: Secondary | ICD-10-CM | POA: Diagnosis not present

## 2019-03-12 DIAGNOSIS — E785 Hyperlipidemia, unspecified: Secondary | ICD-10-CM | POA: Diagnosis not present

## 2019-03-12 DIAGNOSIS — C50111 Malignant neoplasm of central portion of right female breast: Secondary | ICD-10-CM | POA: Diagnosis not present

## 2019-03-12 DIAGNOSIS — Z79811 Long term (current) use of aromatase inhibitors: Secondary | ICD-10-CM | POA: Insufficient documentation

## 2019-03-12 DIAGNOSIS — Z90722 Acquired absence of ovaries, bilateral: Secondary | ICD-10-CM | POA: Insufficient documentation

## 2019-03-12 DIAGNOSIS — Z90710 Acquired absence of both cervix and uterus: Secondary | ICD-10-CM | POA: Diagnosis not present

## 2019-03-12 DIAGNOSIS — E039 Hypothyroidism, unspecified: Secondary | ICD-10-CM | POA: Diagnosis not present

## 2019-03-12 DIAGNOSIS — Z791 Long term (current) use of non-steroidal anti-inflammatories (NSAID): Secondary | ICD-10-CM | POA: Diagnosis not present

## 2019-03-12 DIAGNOSIS — C50412 Malignant neoplasm of upper-outer quadrant of left female breast: Secondary | ICD-10-CM | POA: Diagnosis not present

## 2019-03-12 DIAGNOSIS — Z79899 Other long term (current) drug therapy: Secondary | ICD-10-CM | POA: Insufficient documentation

## 2019-03-12 DIAGNOSIS — Z170 Estrogen receptor positive status [ER+]: Secondary | ICD-10-CM | POA: Diagnosis not present

## 2019-03-12 LAB — COMPREHENSIVE METABOLIC PANEL
ALT: 44 U/L (ref 0–44)
AST: 34 U/L (ref 15–41)
Albumin: 4.1 g/dL (ref 3.5–5.0)
Alkaline Phosphatase: 83 U/L (ref 38–126)
Anion gap: 8 (ref 5–15)
BUN: 18 mg/dL (ref 8–23)
CO2: 27 mmol/L (ref 22–32)
Calcium: 9.4 mg/dL (ref 8.9–10.3)
Chloride: 104 mmol/L (ref 98–111)
Creatinine, Ser: 1.03 mg/dL — ABNORMAL HIGH (ref 0.44–1.00)
GFR calc Af Amer: >60 mL/min (ref >60)
GFR calc non Af Amer: 57 mL/min — ABNORMAL LOW (ref >60)
Glucose, Bld: 102 mg/dL — ABNORMAL HIGH (ref 70–99)
Potassium: 4.4 mmol/L (ref 3.5–5.1)
Sodium: 139 mmol/L (ref 135–145)
Total Bilirubin: 0.4 mg/dL (ref 0.3–1.2)
Total Protein: 7 g/dL (ref 6.5–8.1)

## 2019-03-12 LAB — CBC WITH DIFFERENTIAL/PLATELET
Abs Immature Granulocytes: 0.02 10*3/uL (ref 0.00–0.07)
Basophils Absolute: 0.1 10*3/uL (ref 0.0–0.1)
Basophils Relative: 1 %
Eosinophils Absolute: 0.2 10*3/uL (ref 0.0–0.5)
Eosinophils Relative: 4 %
HCT: 40.5 % (ref 36.0–46.0)
Hemoglobin: 13.1 g/dL (ref 12.0–15.0)
Immature Granulocytes: 0 %
Lymphocytes Relative: 21 %
Lymphs Abs: 1.1 10*3/uL (ref 0.7–4.0)
MCH: 28.4 pg (ref 26.0–34.0)
MCHC: 32.3 g/dL (ref 30.0–36.0)
MCV: 87.9 fL (ref 80.0–100.0)
Monocytes Absolute: 0.6 10*3/uL (ref 0.1–1.0)
Monocytes Relative: 12 %
Neutro Abs: 3.2 10*3/uL (ref 1.7–7.7)
Neutrophils Relative %: 62 %
Platelets: 228 10*3/uL (ref 150–400)
RBC: 4.61 MIL/uL (ref 3.87–5.11)
RDW: 12.5 % (ref 11.5–15.5)
WBC: 5.2 10*3/uL (ref 4.0–10.5)
nRBC: 0 % (ref 0.0–0.2)

## 2019-03-13 ENCOUNTER — Telehealth: Payer: Self-pay | Admitting: Oncology

## 2019-03-13 NOTE — Telephone Encounter (Signed)
I left a message regarding schedule  

## 2019-04-09 DIAGNOSIS — M810 Age-related osteoporosis without current pathological fracture: Secondary | ICD-10-CM | POA: Diagnosis not present

## 2019-04-13 ENCOUNTER — Other Ambulatory Visit: Payer: BLUE CROSS/BLUE SHIELD

## 2019-04-16 ENCOUNTER — Ambulatory Visit: Payer: BLUE CROSS/BLUE SHIELD | Admitting: Oncology

## 2019-04-30 DIAGNOSIS — E7800 Pure hypercholesterolemia, unspecified: Secondary | ICD-10-CM | POA: Diagnosis not present

## 2019-04-30 DIAGNOSIS — L908 Other atrophic disorders of skin: Secondary | ICD-10-CM | POA: Diagnosis not present

## 2019-04-30 DIAGNOSIS — E039 Hypothyroidism, unspecified: Secondary | ICD-10-CM | POA: Diagnosis not present

## 2019-04-30 DIAGNOSIS — M810 Age-related osteoporosis without current pathological fracture: Secondary | ICD-10-CM | POA: Diagnosis not present

## 2019-04-30 DIAGNOSIS — C50912 Malignant neoplasm of unspecified site of left female breast: Secondary | ICD-10-CM | POA: Diagnosis not present

## 2019-05-01 DIAGNOSIS — E7800 Pure hypercholesterolemia, unspecified: Secondary | ICD-10-CM | POA: Diagnosis not present

## 2019-05-01 DIAGNOSIS — C44319 Basal cell carcinoma of skin of other parts of face: Secondary | ICD-10-CM | POA: Diagnosis not present

## 2019-05-01 DIAGNOSIS — R238 Other skin changes: Secondary | ICD-10-CM | POA: Diagnosis not present

## 2019-05-01 DIAGNOSIS — L989 Disorder of the skin and subcutaneous tissue, unspecified: Secondary | ICD-10-CM | POA: Diagnosis not present

## 2019-05-04 DIAGNOSIS — D487 Neoplasm of uncertain behavior of other specified sites: Secondary | ICD-10-CM | POA: Diagnosis not present

## 2019-05-04 DIAGNOSIS — L918 Other hypertrophic disorders of the skin: Secondary | ICD-10-CM | POA: Diagnosis not present

## 2019-05-04 DIAGNOSIS — L909 Atrophic disorder of skin, unspecified: Secondary | ICD-10-CM | POA: Diagnosis not present

## 2019-05-07 DIAGNOSIS — M810 Age-related osteoporosis without current pathological fracture: Secondary | ICD-10-CM | POA: Diagnosis not present

## 2019-05-07 DIAGNOSIS — Z79899 Other long term (current) drug therapy: Secondary | ICD-10-CM | POA: Diagnosis not present

## 2019-05-21 ENCOUNTER — Other Ambulatory Visit: Payer: Self-pay | Admitting: Oncology

## 2019-06-01 DIAGNOSIS — D485 Neoplasm of uncertain behavior of skin: Secondary | ICD-10-CM | POA: Diagnosis not present

## 2019-06-03 DIAGNOSIS — T8484XA Pain due to internal orthopedic prosthetic devices, implants and grafts, initial encounter: Secondary | ICD-10-CM | POA: Diagnosis not present

## 2019-06-03 DIAGNOSIS — M25552 Pain in left hip: Secondary | ICD-10-CM | POA: Diagnosis not present

## 2019-06-03 DIAGNOSIS — C50811 Malignant neoplasm of overlapping sites of right female breast: Secondary | ICD-10-CM | POA: Diagnosis not present

## 2019-06-03 DIAGNOSIS — S72002E Fracture of unspecified part of neck of left femur, subsequent encounter for open fracture type I or II with routine healing: Secondary | ICD-10-CM | POA: Diagnosis not present

## 2019-06-03 DIAGNOSIS — M810 Age-related osteoporosis without current pathological fracture: Secondary | ICD-10-CM | POA: Diagnosis not present

## 2019-06-03 DIAGNOSIS — C50812 Malignant neoplasm of overlapping sites of left female breast: Secondary | ICD-10-CM | POA: Diagnosis not present

## 2019-06-03 DIAGNOSIS — Z170 Estrogen receptor positive status [ER+]: Secondary | ICD-10-CM | POA: Diagnosis not present

## 2019-06-03 DIAGNOSIS — E039 Hypothyroidism, unspecified: Secondary | ICD-10-CM | POA: Diagnosis not present

## 2019-06-03 DIAGNOSIS — E7800 Pure hypercholesterolemia, unspecified: Secondary | ICD-10-CM | POA: Diagnosis not present

## 2019-06-03 DIAGNOSIS — T8489XA Other specified complication of internal orthopedic prosthetic devices, implants and grafts, initial encounter: Secondary | ICD-10-CM | POA: Diagnosis not present

## 2019-06-03 DIAGNOSIS — Z7983 Long term (current) use of bisphosphonates: Secondary | ICD-10-CM | POA: Diagnosis not present

## 2019-06-03 DIAGNOSIS — Z1501 Genetic susceptibility to malignant neoplasm of breast: Secondary | ICD-10-CM | POA: Diagnosis not present

## 2019-06-03 DIAGNOSIS — Z79811 Long term (current) use of aromatase inhibitors: Secondary | ICD-10-CM | POA: Diagnosis not present

## 2019-06-03 DIAGNOSIS — F419 Anxiety disorder, unspecified: Secondary | ICD-10-CM | POA: Diagnosis not present

## 2019-06-03 DIAGNOSIS — Z780 Asymptomatic menopausal state: Secondary | ICD-10-CM | POA: Diagnosis not present

## 2019-06-03 DIAGNOSIS — Z79899 Other long term (current) drug therapy: Secondary | ICD-10-CM | POA: Diagnosis not present

## 2019-06-03 DIAGNOSIS — Z803 Family history of malignant neoplasm of breast: Secondary | ICD-10-CM | POA: Diagnosis not present

## 2019-06-09 DIAGNOSIS — L989 Disorder of the skin and subcutaneous tissue, unspecified: Secondary | ICD-10-CM | POA: Diagnosis not present

## 2019-09-26 ENCOUNTER — Ambulatory Visit
Admission: RE | Admit: 2019-09-26 | Discharge: 2019-09-26 | Disposition: A | Discharge status: Home / Self Care | Payer: Medicare Other | Source: Ambulatory Visit | Attending: Oncology | Admitting: Oncology

## 2019-09-26 ENCOUNTER — Other Ambulatory Visit: Payer: Self-pay

## 2019-09-26 DIAGNOSIS — C50412 Malignant neoplasm of upper-outer quadrant of left female breast: Secondary | ICD-10-CM

## 2019-09-26 DIAGNOSIS — C50111 Malignant neoplasm of central portion of right female breast: Secondary | ICD-10-CM

## 2019-09-26 DIAGNOSIS — C50912 Malignant neoplasm of unspecified site of left female breast: Secondary | ICD-10-CM

## 2019-09-26 DIAGNOSIS — Z170 Estrogen receptor positive status [ER+]: Secondary | ICD-10-CM

## 2019-09-26 DIAGNOSIS — Z1501 Genetic susceptibility to malignant neoplasm of breast: Secondary | ICD-10-CM

## 2019-09-26 MED ORDER — GADOBUTROL 1 MMOL/ML IV SOLN
7.0000 mL | Freq: Once | INTRAVENOUS | Status: AC | PRN
  Administered 2019-09-26: 7 mL via INTRAVENOUS

## 2019-09-28 ENCOUNTER — Telehealth: Payer: Self-pay | Admitting: *Deleted

## 2019-09-28 ENCOUNTER — Other Ambulatory Visit: Payer: Self-pay | Admitting: *Deleted

## 2019-09-28 ENCOUNTER — Telehealth: Payer: Self-pay | Admitting: Hematology and Oncology

## 2019-09-28 ENCOUNTER — Other Ambulatory Visit: Payer: Self-pay | Admitting: Hematology and Oncology

## 2019-09-28 DIAGNOSIS — R928 Other abnormal and inconclusive findings on diagnostic imaging of breast: Secondary | ICD-10-CM

## 2019-09-28 DIAGNOSIS — Z170 Estrogen receptor positive status [ER+]: Secondary | ICD-10-CM

## 2019-09-28 DIAGNOSIS — C50111 Malignant neoplasm of central portion of right female breast: Secondary | ICD-10-CM

## 2019-09-28 NOTE — Telephone Encounter (Signed)
I discussed the breast MRI report which suggested biopsying the breast for evaluation of DCIS versus fat necrosis.  I supported the recommendation based upon my review of her prior history as well as the current imaging studies. A biopsy will be scheduled and Dr. Jana Hakim will follow up on the results.

## 2019-10-05 ENCOUNTER — Other Ambulatory Visit: Payer: Self-pay | Admitting: Oncology

## 2019-10-06 ENCOUNTER — Other Ambulatory Visit: Payer: Self-pay | Admitting: Radiology

## 2019-10-06 ENCOUNTER — Other Ambulatory Visit: Payer: Self-pay | Admitting: *Deleted

## 2019-10-06 ENCOUNTER — Ambulatory Visit
Admission: RE | Admit: 2019-10-06 | Discharge: 2019-10-06 | Disposition: A | Discharge status: Home / Self Care | Payer: Medicare Other | Source: Ambulatory Visit | Attending: Hematology and Oncology | Admitting: Hematology and Oncology

## 2019-10-06 ENCOUNTER — Other Ambulatory Visit: Payer: Self-pay

## 2019-10-06 DIAGNOSIS — C50111 Malignant neoplasm of central portion of right female breast: Secondary | ICD-10-CM

## 2019-10-06 DIAGNOSIS — Z170 Estrogen receptor positive status [ER+]: Secondary | ICD-10-CM

## 2019-10-06 DIAGNOSIS — R928 Other abnormal and inconclusive findings on diagnostic imaging of breast: Secondary | ICD-10-CM

## 2019-10-06 MED ORDER — EXEMESTANE 25 MG PO TABS: 25.0000 mg | ORAL_TABLET | Freq: Every day | ORAL | 4 refills | Status: DC

## 2019-10-06 MED ORDER — GADOBUTROL 1 MMOL/ML IV SOLN
6.0000 mL | Freq: Once | INTRAVENOUS | Status: AC | PRN
  Administered 2019-10-06: 6 mL via INTRAVENOUS

## 2019-10-07 ENCOUNTER — Other Ambulatory Visit: Payer: Self-pay | Admitting: Oncology

## 2019-10-07 NOTE — Telephone Encounter (Signed)
No entry 

## 2019-10-09 ENCOUNTER — Other Ambulatory Visit: Payer: Self-pay | Admitting: Oncology

## 2019-10-09 DIAGNOSIS — C50412 Malignant neoplasm of upper-outer quadrant of left female breast: Secondary | ICD-10-CM

## 2019-10-09 NOTE — Progress Notes (Signed)
[facility] called to let me know they were appreciative of my partner Dr. Oliva Bustard help in getting them the information they needed.  He also tells me that [name] has been feeling chilly all the time and he wonders if she is hypothyroid.  We are setting her up for labs next week.

## 2019-10-12 NOTE — Progress Notes (Signed)
Error

## 2019-10-14 ENCOUNTER — Other Ambulatory Visit: Payer: Self-pay

## 2019-10-14 ENCOUNTER — Other Ambulatory Visit: Payer: Medicare Other

## 2019-10-14 ENCOUNTER — Inpatient Hospital Stay: Payer: Medicare Other | Attending: Oncology

## 2019-10-14 DIAGNOSIS — Z170 Estrogen receptor positive status [ER+]: Secondary | ICD-10-CM | POA: Diagnosis not present

## 2019-10-14 DIAGNOSIS — C50412 Malignant neoplasm of upper-outer quadrant of left female breast: Secondary | ICD-10-CM

## 2019-10-14 DIAGNOSIS — C50111 Malignant neoplasm of central portion of right female breast: Secondary | ICD-10-CM | POA: Insufficient documentation

## 2019-10-15 LAB — THYROID PANEL WITH TSH
Free Thyroxine Index: 2.6 (ref 1.2–4.9)
T3 Uptake Ratio: 29 % (ref 24–39)
T4, Total: 8.8 ug/dL (ref 4.5–12.0)
TSH: 2.47 u[IU]/mL (ref 0.450–4.500)

## 2019-11-19 ENCOUNTER — Encounter: Payer: Self-pay | Admitting: Genetic Counselor

## 2019-12-04 ENCOUNTER — Ambulatory Visit
Admission: RE | Admit: 2019-12-04 | Discharge: 2019-12-04 | Disposition: A | Discharge status: Home / Self Care | Payer: Medicare Other | Source: Ambulatory Visit | Attending: Internal Medicine | Admitting: Internal Medicine

## 2019-12-04 ENCOUNTER — Other Ambulatory Visit: Payer: Self-pay | Admitting: Internal Medicine

## 2019-12-04 DIAGNOSIS — R053 Chronic cough: Secondary | ICD-10-CM

## 2020-02-01 ENCOUNTER — Other Ambulatory Visit: Payer: Self-pay

## 2020-02-01 DIAGNOSIS — Z170 Estrogen receptor positive status [ER+]: Secondary | ICD-10-CM

## 2020-02-01 DIAGNOSIS — C50412 Malignant neoplasm of upper-outer quadrant of left female breast: Secondary | ICD-10-CM

## 2020-03-15 ENCOUNTER — Other Ambulatory Visit: Payer: BC Managed Care – PPO

## 2020-03-15 ENCOUNTER — Ambulatory Visit: Payer: Self-pay | Admitting: Oncology

## 2020-03-21 NOTE — Progress Notes (Signed)
[place]  [phone] [phone]     ID: [name]   DOB: [birth year]  MR#: [id number]  JJH#:[id number]  Patient Care Team: Levin Erp, MD as PCP - General (Internal Medicine) Magrinat, Virgie Dad, MD as Consulting Physician (Oncology) Norma Fredrickson, MD as Consulting Physician (Psychiatry) [name], MD as Consulting Physician (Orthopedic Surgery) Azucena Fallen, MD as Consulting Physician (Obstetrics and Gynecology) Gery Pray, MD as Consulting Physician (Radiation Oncology) Jovita Kussmaul, MD as Consulting Physician (General Surgery)  OTHER MD: Manon Hilding   CHIEF COMPLAINT: Estrogen receptor positive breast cancer in BRCA-positive patient  CURRENT TREATMENT: exemestane   INTERVAL HISTORY: [name] returns today for follow-up of her contralateral breast cancer.   She started on examestane on 01/01/2019.  She is tolerating this remarkably well, with no hot flashes at all and no problems with vaginal dryness.  Since her last visit, she underwent breast MRI on 09/26/2019 showing: breast composition C; 4.6 cm area of patchy, non-mass enhancement in right breast extending from 6 o'clock to lower-outer quadrant in area of previous lumpectomy; no evidence of left breast malignancy or abnormal lymph nodes.  She proceeded to biopsy of the right breast area in question on 10/06/2019. Pathology from the procedure ([id number]) showed fibrosis and cysts with mild epithelial atypia, features most consistent with therapy-related atypia.   REVIEW OF SYSTEMS: [name] remains very stable. She exercises by walking and also is doing some Jazzercise. A detailed review of systems today was otherwise noncontributory   COVID 19 VACCINATION STATUS: Status post Pfizer x2 plus booster   HISTORY OF BREAST CANCER: From the original intake note:   Rieley had an unremarkable mammogram at [facility] on December 16, 2006.  Her breasts are dense, and of course  that decreases the sensitivity.  In any case the screening mammogram was read as unremarkable.  In December, however, [name] had her annual examination under [name], and Dr. Irven Baltimore palpated a lump in the left breast at the 3 oclock position.  The patient was referred back for ultrasound, and this showed a hypoechoic mass measuring 1.4 cm corresponding to the palpable abnormality.  Going back to the September mammogram, Dr. [name] was able to tease out what possibly could have been a mammographic finding, but really it was so equivocal, that even in retrospect, the mammogram was not informative.   This was followed up with breast specific gamma imaging on March 11, 2007, and this found a solitary lesion, measuring up to 1.7 cm.  In addition, there was a 1.0 cm low intensity uptake area in the axilla, considered an indeterminate finding.    With this information, the patient proceeded to biopsy under ultrasound guidance of the mass.  This was performed on December 9, and it showed an invasive ductal carcinoma, which was ER positive at 100%, PR positive at 56%, with a borderline/low proliferation marker at 17%, and Hercept test negative at 1+.  [name] was then referred to Dr. Hassell Done for further evaluation, and direction for definitive surgery, and she underwent left lumpectomy and sentinel lymph node biopsy March 13, 2007 for what proved to be ([id number]) a 2.1 cm invasive ductal carcinoma with some micro-papillary features, grade 2, with evidence of lymphovascular invasion, but zero of two sentinel lymph nodes involved.  Margins were adequate for both the invasive and in situ components.   Her subsequent history is as detailed below.   PAST MEDICAL HISTORY: Past Medical History:  Diagnosis Date   Anxiety  Anxiety disorder    BRCA2 positive 07/14/2012   Cancer ([place]) 2008   left breast   Dyslipidemia    Fall    Goiter    History of radiation therapy     Hypothyroidism    Osteoporosis     PAST SURGICAL HISTORY: Past Surgical History:  Procedure Laterality Date   ABDOMINAL HYSTERECTOMY  2009   with BSO   BREAST LUMPECTOMY  2595,6387, 1991   BREAST LUMPECTOMY WITH RADIOACTIVE SEED LOCALIZATION Right 10/02/2018   Procedure: RIGHT BREAST LUMPECTOMY WITH RADIOACTIVE SEED LOCALIZATION;  Surgeon: Jovita Kussmaul, MD;  Location: [facility];  Service: General;  Laterality: Right;   COLONOSCOPY N/A 12/13/2015   Procedure: COLONOSCOPY;  Surgeon: [name], MD;  Location: [facility];  Service: Endoscopy;  Laterality: N/A;   FRACTURE SURGERY  2015   left hip   HARDWARE REMOVAL Left 07/08/2015   Procedure: LEFT HIP HARDWARE REMOVAL;  Surgeon: [name], MD;  Location: [facility];  Service: Orthopedics;  Laterality: Left;    FAMILY HISTORY (updated OCT 2013) Family History  Problem Relation Age of Onset   Breast cancer Mother    Colon cancer Father    Cancer Maternal Grandmother        unknown primary   The patients father is currently 62, with a remote history of colon cancer.  The patients mother died at the age of 28.  She had a history of breast cancer and the patients mothers mother died from cancer, but the actual original site of cancer is unclear.  The patient has several stepbrothers and sisters, but only one half-sister, currently 36, with no history of cancer.    GYNECOLOGIC HISTORY: She is GX, P2.  First pregnancy age 19.  Last menstrual period age 5.  She did not take hormone replacement.     SOCIAL HISTORY: (updated OCT 2013) Of course she is [name] wife. He has 4 children from an earlier marriage. She has two from hers, a son who works at [employer] in [place], and a  son who lives in [place]. They have 3 grandchildren.    ADVANCED DIRECTIVES: in place   HEALTH MAINTENANCE: Social History   Tobacco Use   Smoking status: Former Smoker    Types: Cigarettes   Smokeless tobacco: Never  Used  Substance Use Topics   Alcohol use: Yes    Comment: GLASS OF WINE MOST DAYS   Drug use: No     Colonoscopy: August 2017/ [facility]  PAP: April 2014  Bone density: 2013 AT [facility]/ stable osteoporosis  Lipid panel:  Allergies  Allergen Reactions   Adhesive [Tape] Rash   Chloraprep One Step [Chlorhexidine Gluconate] Itching    Skin tears   Latex Rash   Betadine [Povidone Iodine]    Ampicillin Rash    Has patient had a PCN reaction causing immediate rash, facial/tongue/throat swelling, SOB or lightheadedness with hypotension: No Has patient had a PCN reaction causing severe rash involving mucus membranes or skin necrosis: No Has patient had a PCN reaction that required hospitalization No Has patient had a PCN reaction occurring within the last 10 years: No If all of the above answers are "NO", then may proceed with Cephalosporin use.   Macrodantin [Nitrofurantoin] Rash    Current Outpatient Medications  Medication Sig Dispense Refill   atorvastatin (LIPITOR) 10 MG tablet Take 5 mg by mouth daily.      Biotin 1 MG CAPS Take by mouth.  BL EVENING PRIMROSE OIL PO Take by mouth.     Cholecalciferol (VITAMIN D3) 2000 UNITS TABS Take 1 tablet by mouth daily.      clorazepate (TRANXENE) 7.5 MG tablet Take 15 mg by mouth at bedtime.      exemestane (AROMASIN) 25 MG tablet Take 1 tablet (25 mg total) by mouth daily after breakfast. To start 01/15/2019 90 tablet 4   HYDROcodone-acetaminophen (NORCO/VICODIN) 5-325 MG tablet Take 1-2 tablets by mouth every 6 (six) hours as needed for moderate pain or severe pain. (Patient not taking: Reported on 10/29/2018) 15 tablet 0   ibuprofen (ADVIL,MOTRIN) 200 MG tablet Take 400 mg by mouth every 6 (six) hours as needed for mild pain.     levothyroxine (SYNTHROID, LEVOTHROID) 125 MCG tablet Take 125 mcg by mouth daily.       multivitamin-lutein (OCUVITE-LUTEIN) CAPS capsule Take 1 capsule by mouth daily.     Romosozumab-aqqg  (EVENITY [place]) Inject into the skin.     traZODone (DESYREL) 100 MG tablet Take 100 mg by mouth at bedtime.     No current facility-administered medications for this visit.    OBJECTIVE: white woman who appears younger than stated age  58:   03/22/20 1109  BP: (!) 129/57  Pulse: 75  Resp: 18  Temp: 97.6 F (36.4 C)  SpO2: 100%   Wt Readings from Last 3 Encounters:  03/22/20 142 lb 3.2 oz (64.5 kg)  03/12/19 142 lb 9.6 oz (64.7 kg)  02/09/19 143 lb 6 oz (65 kg)   Body mass index is 24.41 kg/m.    ECOG FS:1 - Symptomatic but completely ambulatory  Sclerae unicteric, EOMs intact Wearing a mask No cervical or supraclavicular adenopathy Lungs no rales or rhonchi Heart regular rate and rhythm Abd soft, nontender, positive bowel sounds MSK no focal spinal tenderness, no upper extremity lymphedema Neuro: nonfocal, well oriented, appropriate affect Breasts: The right breast is status post lumpectomy followed by radiation. There is no palpable abnormality. The left breast is status post remote lumpectomy and radiation. There is no evidence of local recurrence. Both axillae are benign   LAB RESULTS: Lab Results  Component Value Date   WBC 4.5 03/22/2020   NEUTROABS 2.7 03/22/2020   HGB 13.5 03/22/2020   HCT 40.5 03/22/2020   MCV 87.7 03/22/2020   PLT 193 03/22/2020      Chemistry      Component Value Date/Time   NA 139 03/12/2019 1021   NA 138 02/15/2016 1229   K 4.4 03/12/2019 1021   K 4.5 02/15/2016 1229   CL 104 03/12/2019 1021   CL 106 07/16/2012 1201   CO2 27 03/12/2019 1021   CO2 26 02/15/2016 1229   BUN 18 03/12/2019 1021   BUN 13.7 02/15/2016 1229   CREATININE 1.03 (H) 03/12/2019 1021   CREATININE 0.9 02/15/2016 1229      Component Value Date/Time   CALCIUM 9.4 03/12/2019 1021   CALCIUM 10.4 02/15/2016 1229   ALKPHOS 83 03/12/2019 1021   ALKPHOS 54 02/15/2016 1229   AST 34 03/12/2019 1021   AST 22 02/15/2016 1229   ALT 44 03/12/2019 1021   ALT  25 02/15/2016 1229   BILITOT 0.4 03/12/2019 1021   BILITOT 0.39 02/15/2016 1229      Lab Results  Component Value Date   LABCA2 20 01/09/2012    STUDIES: No results found.  Annual mammogram. BRCA2 . Personal history of breast cancer in right breast in 2020 at age 70 with lumpectomy  and radiation therapy, along with history of breast cancer in the left breast in 2008 at age 19 with lumpectomy and radiation therapy. Mother was diagnosed with breast cancer at age 83. Maternal grandmother was diagnosd with breast cancer at age 51.  Digital breast tomosynthesis imaging has been obtained as part of this examination. Breast Composition Category C: The breast tissue is heterogeneously dense, which may obscure small masses.  Current study was also evaluated with a Computer Aided Detection (CAD) system. Previous lumpectomy changes on the right are noted.  No significant masses, calcifications, or other findings are seen in either breast.  IMPRESSION: BENIGN There is no mammographic evidence of malignancy. A follow-up mammogram in 12 months is recommended.(03/15/2021)  This exam was interpreted at the [facility] location.  Darleen Crocker D.O. kf/penrad:03/15/2020 13:50:48   Letter: Written- These findings were given to the patient in writing at the time of examination. BI-RADS Category 2: Benign   Electronically Signed By: Darleen Crocker DO  ASSESSMENT: 64 y.o. BRCA2 positive [place] woman   (1)  status post left upper outer quadrant lumpectomy and sentinel lymph node biopsy December 2008 for a T2 N0 invasive ductal carcinoma, grade 2, strongly estrogen and progesterone receptor positive, HER2 negative, with an MIB1 of 17%,  (a) Oncotype DX recurrence score of 20 predicted a 10-year risk of recurrence of 13% after 5 years on tamoxifen  (b) patient had taken tamoxifen for 5 years remotely for breast cancer prevention  (c) started Arimidex April 2009  (d) switched to tamoxifen  January 2015  (e) switched to raloxifene January 2019--discontinued June 2020  (2) s/p TAH/BSO with benign pathology in July 2009  (3) on intensified screening, receiving mammography in December and breast MRI in May yearly  (4) right breast biopsy 09/10/2018 shows ductal carcinoma in situ, high-grade  (5) status post right lumpectomy 10/02/2018 for a pT1b pNX, stage I invasive ductal carcinoma, grade 2, with negative margins  (a) no sentinel lymph node sampling performed  (6) adjuvant radiation 11/06/2018 - 12/25/2018  (a) right breast / 50.4 Gy in 28 fractions  (b) right axilla / 45 Gy in 25 fractions  (c) right breast boost / 12 Gy in 6 fractions  (7) started exemestane 01/01/2019   PLAN:  [name] is now 13 years out from her left breast cancer and 1-1/2 years out from her right breast cancer. There is no evidence of disease activity. This is very favorable.  She is tolerating exemestane well. The plan is to continue that for a total of 5 years  I have commended her excellent exercise program. She will have breast MRI in June 2022. If it turns out to be too expensive we will try the "fast" version. She will then see me in December actually before she has her December mammogram 2022  She knows to call for any other issue that may develop before the next visit  Total encounter time 25 minutes.*  Magrinat, Virgie Dad, MD  03/22/20 11:27 AM Medical Oncology and Hematology [facility] [place], [place] [address] Tel. [phone]    Fax. [phone]   I, Wilburn Mylar, am acting as scribe for Dr. Virgie Dad. Magrinat.  I, Lurline Del MD, have reviewed the above documentation for accuracy and completeness, and I agree with the above.   *Total Encounter Time as defined by the Centers for Medicare and Medicaid Services includes, in addition to the face-to-face time of a patient visit (documented in the note above) non-face-to-face time:  obtaining  and reviewing outside history, ordering and reviewing medications, tests or procedures, care coordination (communications with other health care professionals or caregivers) and documentation in the medical record.

## 2020-03-22 ENCOUNTER — Inpatient Hospital Stay (HOSPITAL_BASED_OUTPATIENT_CLINIC_OR_DEPARTMENT_OTHER): Payer: Medicare Other | Admitting: Oncology

## 2020-03-22 ENCOUNTER — Other Ambulatory Visit: Payer: Self-pay

## 2020-03-22 ENCOUNTER — Other Ambulatory Visit: Payer: Self-pay | Admitting: *Deleted

## 2020-03-22 ENCOUNTER — Telehealth: Payer: Self-pay | Admitting: Oncology

## 2020-03-22 ENCOUNTER — Inpatient Hospital Stay: Payer: Medicare Other | Attending: Oncology

## 2020-03-22 VITALS — BP 129/57 | HR 75 | Temp 97.6°F | Resp 18 | Ht 64.0 in | Wt 142.2 lb

## 2020-03-22 DIAGNOSIS — C50412 Malignant neoplasm of upper-outer quadrant of left female breast: Secondary | ICD-10-CM | POA: Diagnosis present

## 2020-03-22 DIAGNOSIS — Z87891 Personal history of nicotine dependence: Secondary | ICD-10-CM | POA: Insufficient documentation

## 2020-03-22 DIAGNOSIS — Z170 Estrogen receptor positive status [ER+]: Secondary | ICD-10-CM

## 2020-03-22 DIAGNOSIS — E785 Hyperlipidemia, unspecified: Secondary | ICD-10-CM | POA: Diagnosis not present

## 2020-03-22 DIAGNOSIS — E039 Hypothyroidism, unspecified: Secondary | ICD-10-CM | POA: Insufficient documentation

## 2020-03-22 DIAGNOSIS — Z809 Family history of malignant neoplasm, unspecified: Secondary | ICD-10-CM | POA: Insufficient documentation

## 2020-03-22 DIAGNOSIS — Z90710 Acquired absence of both cervix and uterus: Secondary | ICD-10-CM | POA: Diagnosis not present

## 2020-03-22 DIAGNOSIS — Z800 Family history of malignant neoplasm of digestive organs: Secondary | ICD-10-CM | POA: Diagnosis not present

## 2020-03-22 DIAGNOSIS — Z79811 Long term (current) use of aromatase inhibitors: Secondary | ICD-10-CM | POA: Diagnosis not present

## 2020-03-22 DIAGNOSIS — Z803 Family history of malignant neoplasm of breast: Secondary | ICD-10-CM | POA: Insufficient documentation

## 2020-03-22 DIAGNOSIS — C50111 Malignant neoplasm of central portion of right female breast: Secondary | ICD-10-CM

## 2020-03-22 DIAGNOSIS — F419 Anxiety disorder, unspecified: Secondary | ICD-10-CM | POA: Insufficient documentation

## 2020-03-22 DIAGNOSIS — C50912 Malignant neoplasm of unspecified site of left female breast: Secondary | ICD-10-CM

## 2020-03-22 DIAGNOSIS — Z90722 Acquired absence of ovaries, bilateral: Secondary | ICD-10-CM | POA: Insufficient documentation

## 2020-03-22 DIAGNOSIS — M810 Age-related osteoporosis without current pathological fracture: Secondary | ICD-10-CM | POA: Insufficient documentation

## 2020-03-22 DIAGNOSIS — Z1501 Genetic susceptibility to malignant neoplasm of breast: Secondary | ICD-10-CM

## 2020-03-22 LAB — CMP (CANCER CENTER ONLY)
ALT: 43 U/L (ref 0–44)
AST: 27 U/L (ref 15–41)
Albumin: 4.2 g/dL (ref 3.5–5.0)
Alkaline Phosphatase: 60 U/L (ref 38–126)
Anion gap: 8 (ref 5–15)
BUN: 16 mg/dL (ref 8–23)
CO2: 26 mmol/L (ref 22–32)
Calcium: 10 mg/dL (ref 8.9–10.3)
Chloride: 104 mmol/L (ref 98–111)
Creatinine: 1.04 mg/dL — ABNORMAL HIGH (ref 0.44–1.00)
GFR, Estimated: 60 mL/min — ABNORMAL LOW (ref >60)
Glucose, Bld: 100 mg/dL — ABNORMAL HIGH (ref 70–99)
Potassium: 4.1 mmol/L (ref 3.5–5.1)
Sodium: 138 mmol/L (ref 135–145)
Total Bilirubin: 0.4 mg/dL (ref 0.3–1.2)
Total Protein: 7.4 g/dL (ref 6.5–8.1)

## 2020-03-22 LAB — CBC WITH DIFFERENTIAL (CANCER CENTER ONLY)
Abs Immature Granulocytes: 0.01 10*3/uL (ref 0.00–0.07)
Basophils Absolute: 0 10*3/uL (ref 0.0–0.1)
Basophils Relative: 1 %
Eosinophils Absolute: 0.2 10*3/uL (ref 0.0–0.5)
Eosinophils Relative: 4 %
HCT: 40.5 % (ref 36.0–46.0)
Hemoglobin: 13.5 g/dL (ref 12.0–15.0)
Immature Granulocytes: 0 %
Lymphocytes Relative: 26 %
Lymphs Abs: 1.2 10*3/uL (ref 0.7–4.0)
MCH: 29.2 pg (ref 26.0–34.0)
MCHC: 33.3 g/dL (ref 30.0–36.0)
MCV: 87.7 fL (ref 80.0–100.0)
Monocytes Absolute: 0.4 10*3/uL (ref 0.1–1.0)
Monocytes Relative: 9 %
Neutro Abs: 2.7 10*3/uL (ref 1.7–7.7)
Neutrophils Relative %: 60 %
Platelet Count: 193 10*3/uL (ref 150–400)
RBC: 4.62 MIL/uL (ref 3.87–5.11)
RDW: 12.5 % (ref 11.5–15.5)
WBC Count: 4.5 10*3/uL (ref 4.0–10.5)
nRBC: 0 % (ref 0.0–0.2)

## 2020-03-22 MED ORDER — EXEMESTANE 25 MG PO TABS: 25.0000 mg | ORAL_TABLET | Freq: Every day | ORAL | 4 refills | Status: DC

## 2020-03-22 NOTE — Telephone Encounter (Signed)
Scheduled appts per 12/21 los. Gave pt a print out of AVS.

## 2020-03-29 ENCOUNTER — Other Ambulatory Visit: Payer: Self-pay | Admitting: *Deleted

## 2020-03-29 MED ORDER — EXEMESTANE 25 MG PO TABS: 25.0000 mg | ORAL_TABLET | Freq: Every day | ORAL | 4 refills | Status: DC

## 2020-06-10 ENCOUNTER — Encounter: Payer: Self-pay | Admitting: Oncology

## 2020-09-06 ENCOUNTER — Encounter: Payer: Self-pay | Admitting: Oncology

## 2020-09-10 IMAGING — CR LEFT RIBS - 2 VIEW
3 series · 3 of 3 positions shown · non-contrast
Comparison: Two-view chest radiographs today reported separately.
Chest radiographs 03/25/2017. Chest CT 11/02/2016.

CLINICAL DATA: 63-year-old female with left lower rib injury 2
weeks ago with continued pain.

EXAM:
LEFT RIBS - 2 VIEW

[w ribs ap upper left]
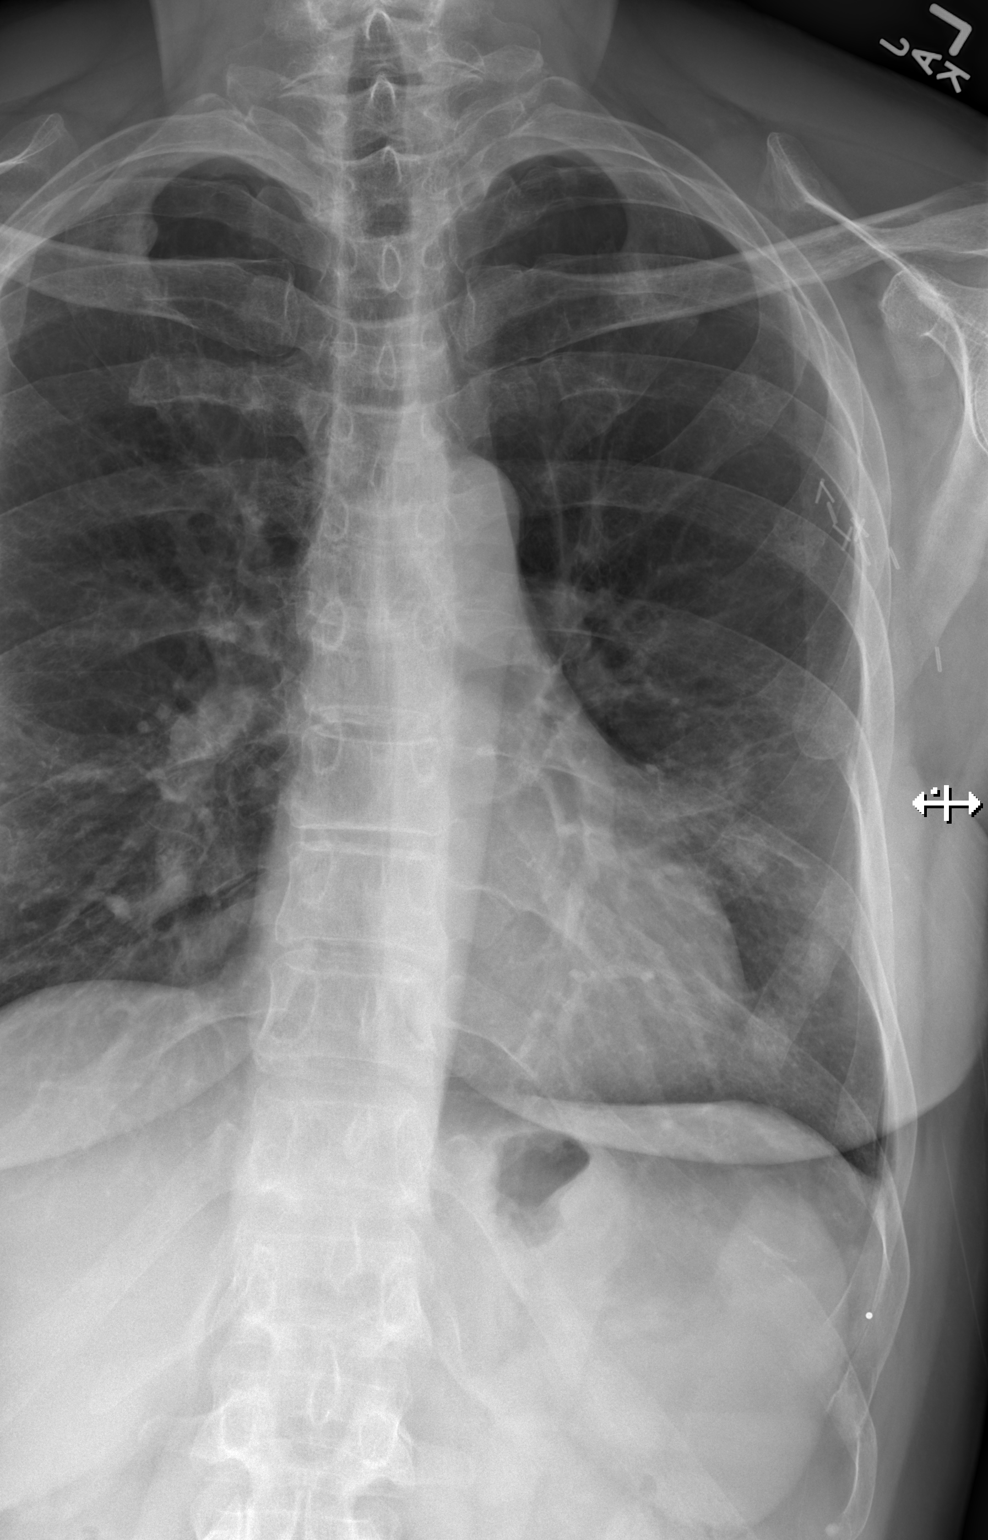

[w ribs obl left]
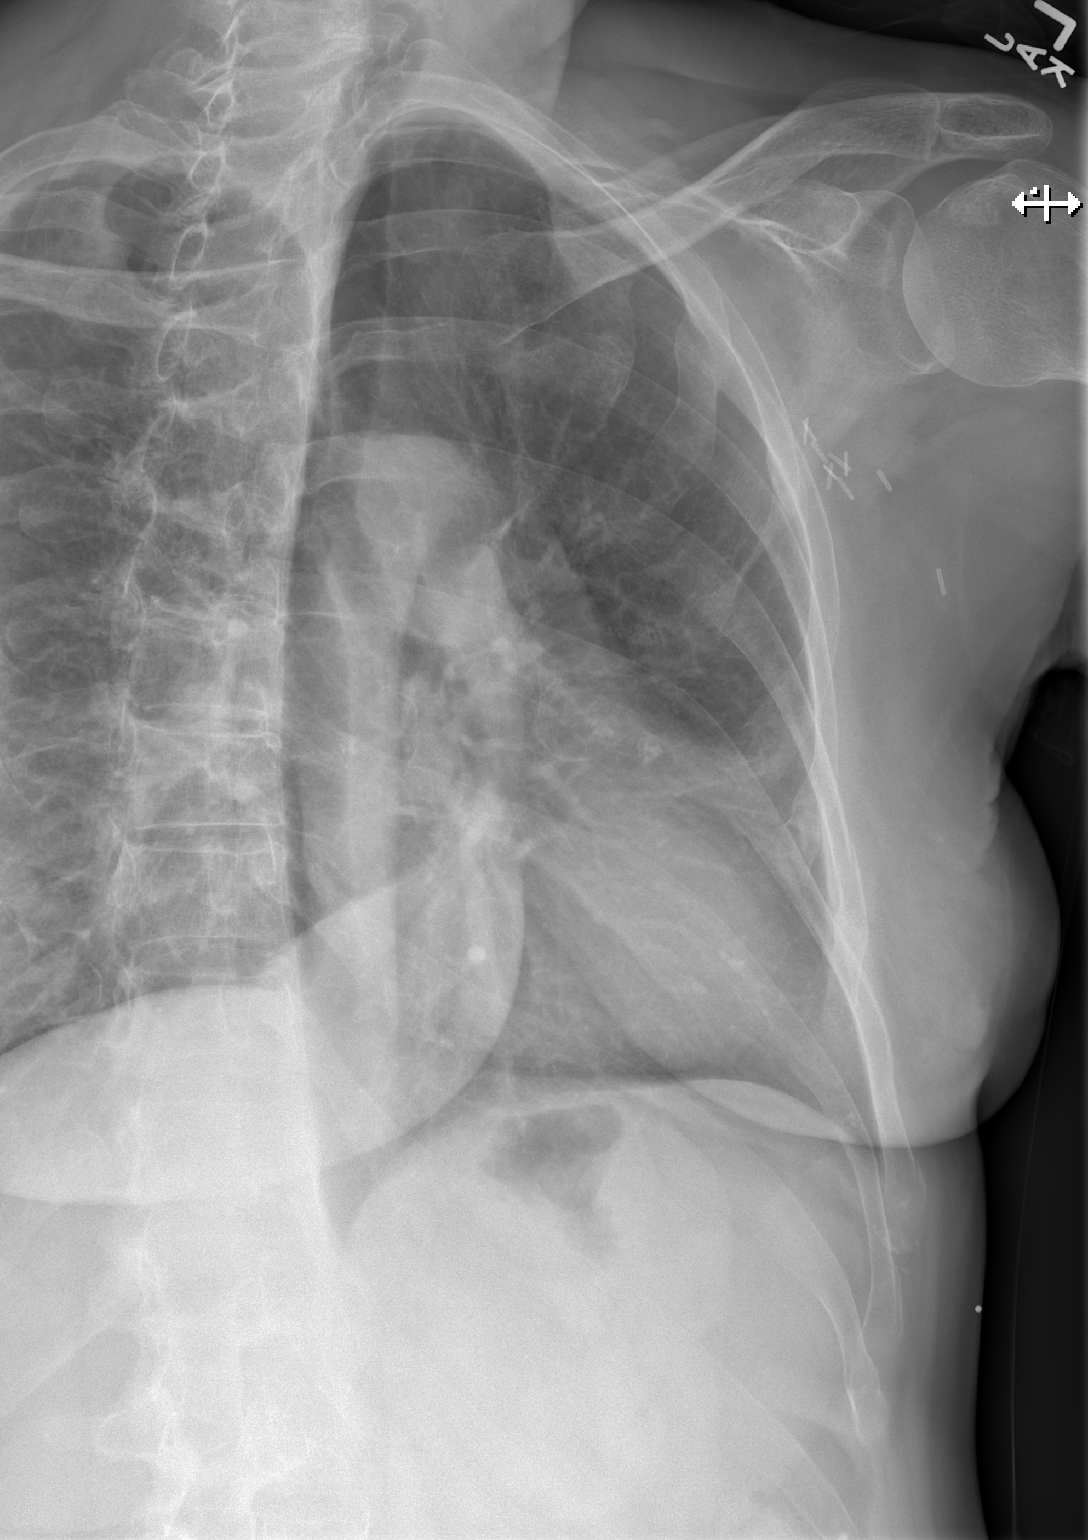

[w ribs ap lower left]
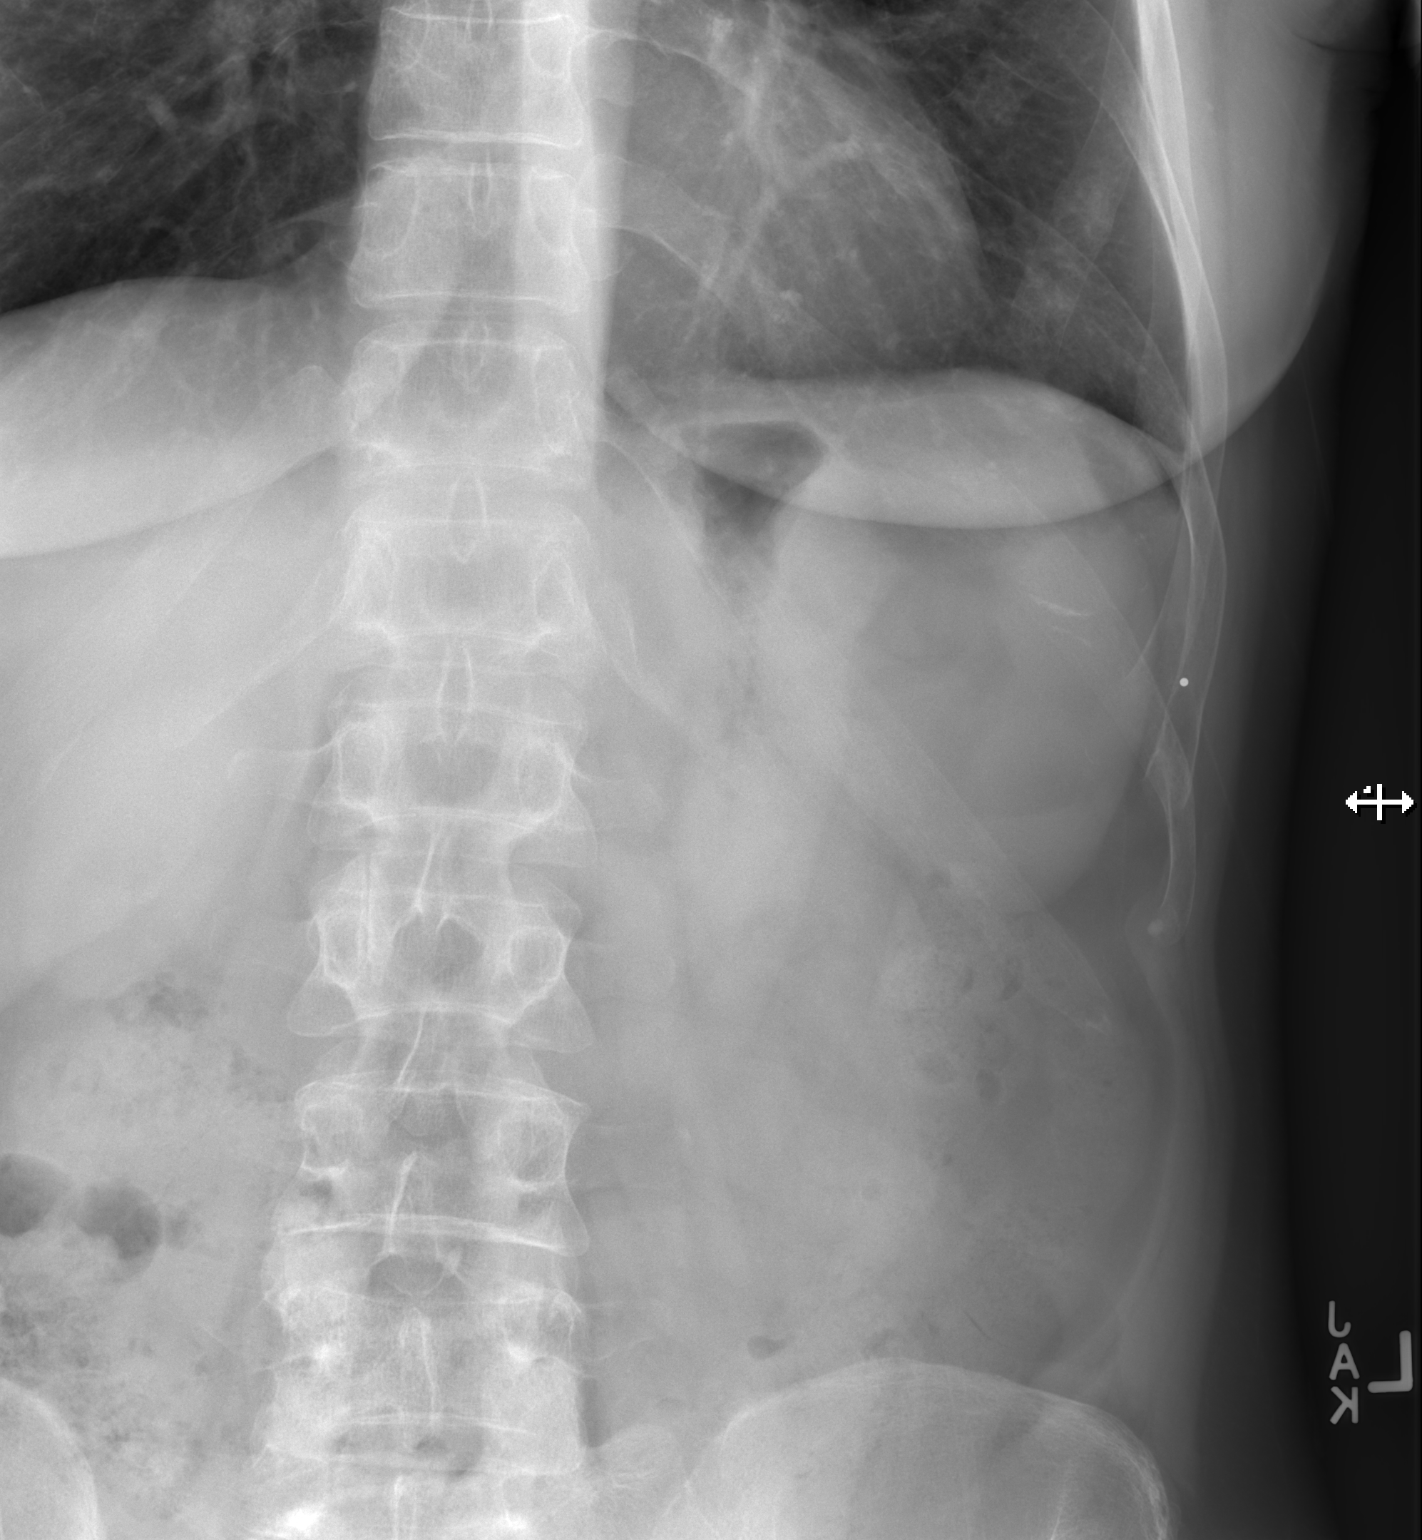

[3 of 3 positions shown; findings below may reference images not displayed]

FINDINGS: Chronic left axillary lymph node dissection. Mediastinal contours
appear stable and within normal limits. Visualized tracheal air
column is within normal limits. Stable visible lung parenchyma.

Left rib marker placed at the anterior left [DATE] rib level. There is
a nondisplaced fracture suspected at the anterior left 7th rib.
Superimposed chronic anterior 1st and 2nd rib fractures.
Questionable healing left lateral 6th rib fracture also. Elsewhere
visible osseous structures appear intact. Negative visible bowel gas
pattern.
IMPRESSION: 1. Nondisplaced fractures suspected at the left 6th and 7th ribs.
2. No left lung pneumothorax or pleural effusion.

## 2020-09-10 IMAGING — CR CHEST - 2 VIEW
2 series · 2 of 2 positions shown · non-contrast
Comparison: 03/25/2017 and rib radiograph 09/05/2018

CLINICAL DATA: Rib pain on the left side. History of breast cancer.

EXAM:
CHEST - 2 VIEW

[w chest pa]
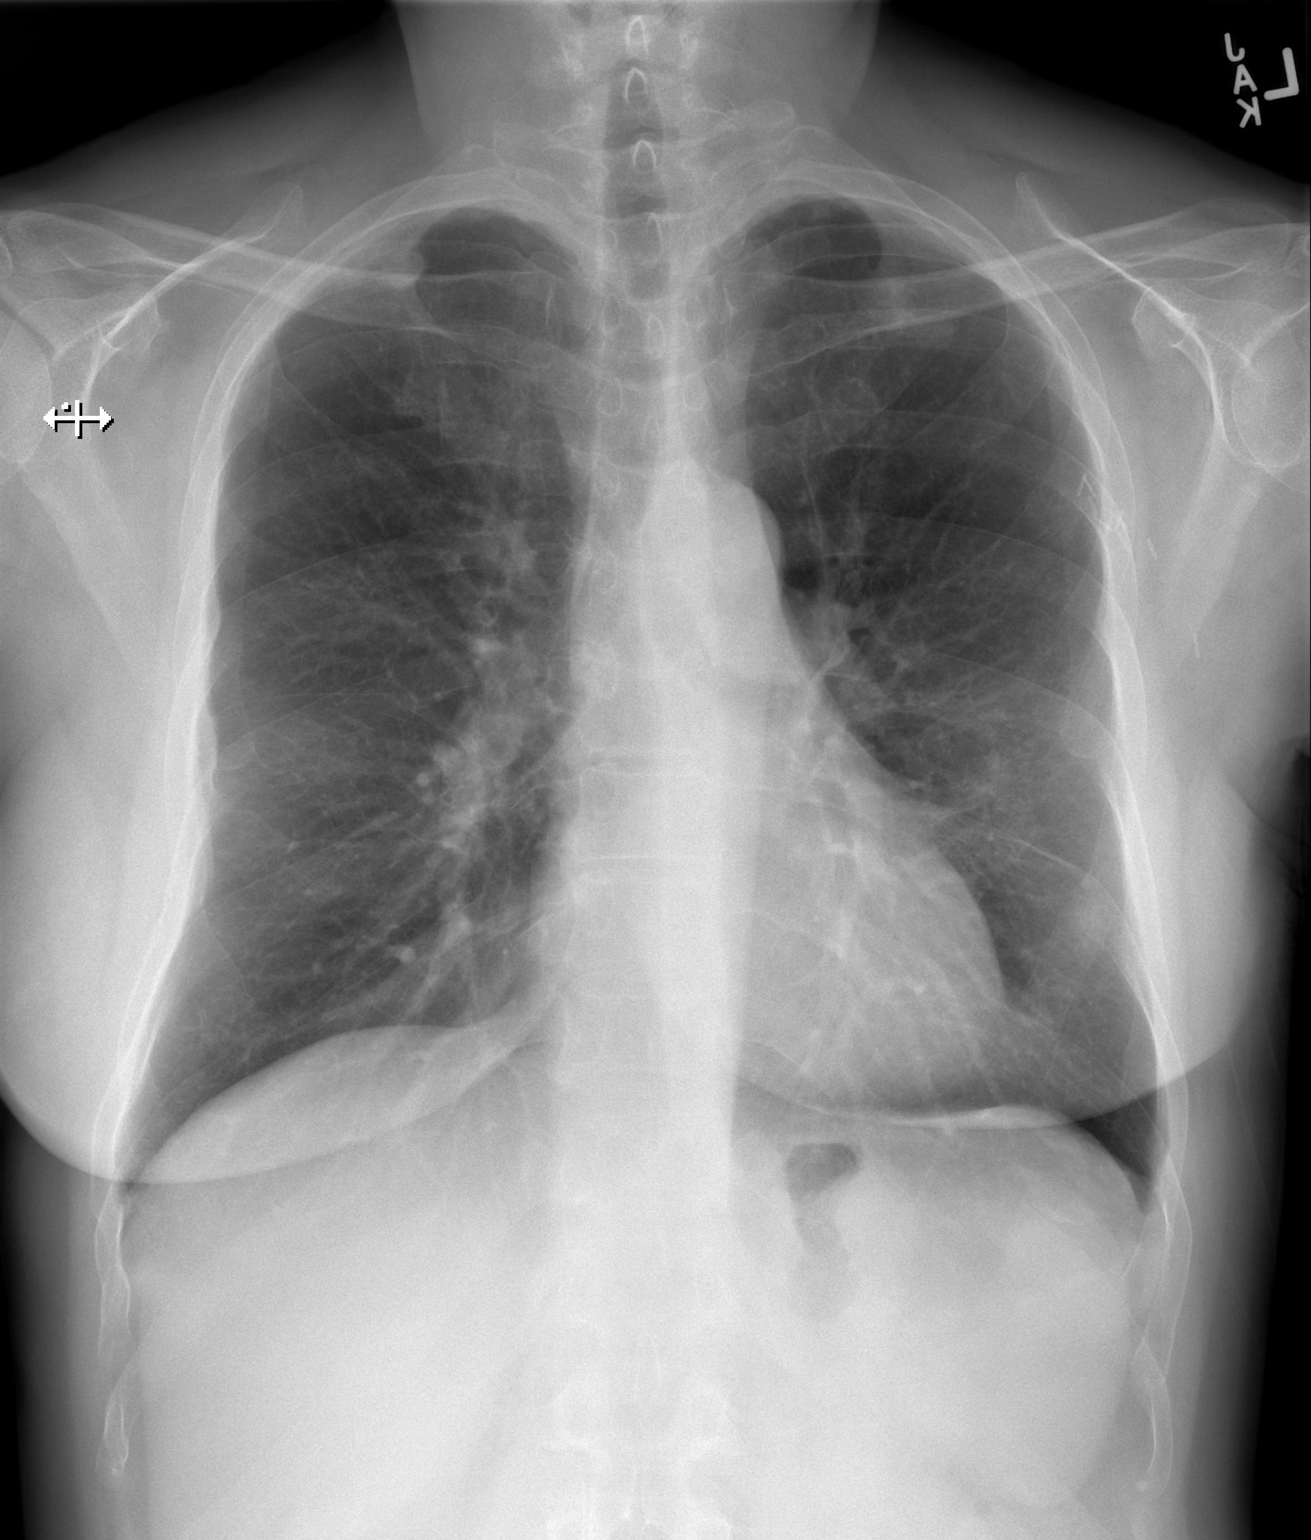

[w chest lat]
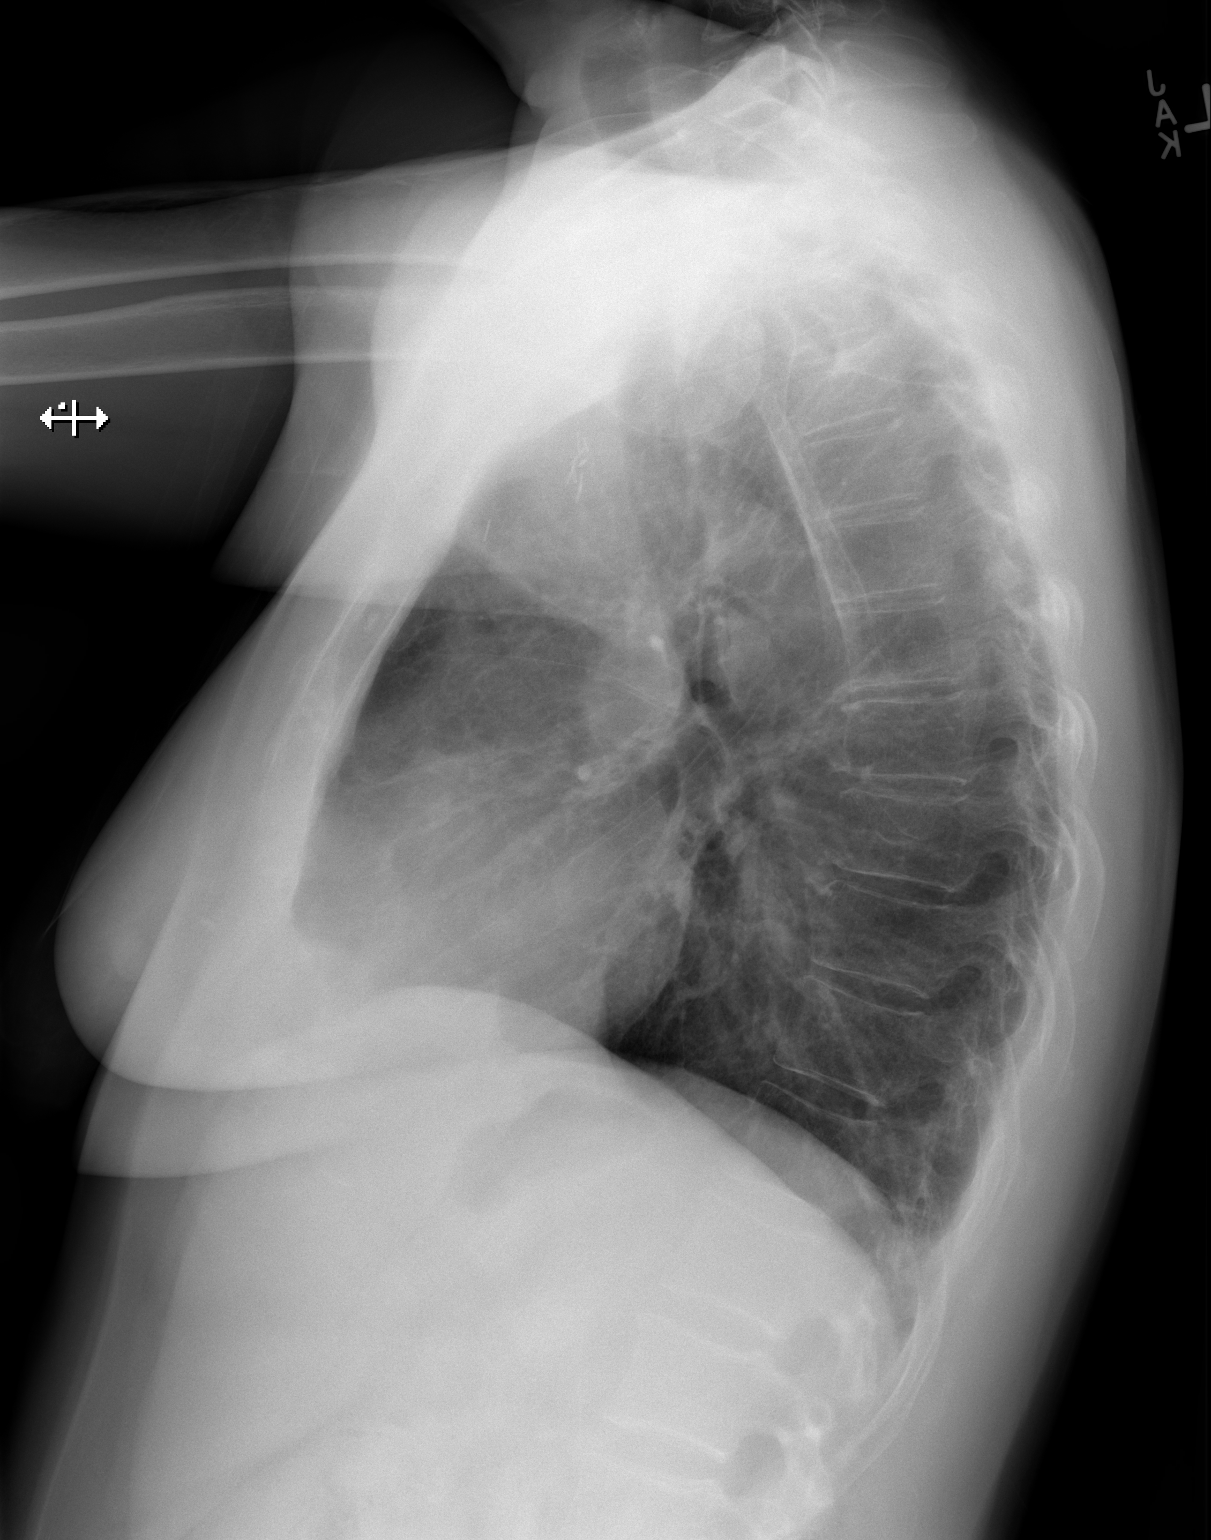

[2 of 2 positions shown; findings below may reference images not displayed]

FINDINGS: Surgical clips in the left axilla. Lungs are clear. Negative for a
pneumothorax. Old fractures involving the left first, second and
third ribs. Probable old fracture involving the anterior left sixth
rib. Heart and mediastinum are within normal limits. No pleural
effusions. Multiple old right rib fractures.
IMPRESSION: No active cardiopulmonary disease.

Old rib fractures.

## 2020-09-26 ENCOUNTER — Ambulatory Visit
Admission: RE | Admit: 2020-09-26 | Discharge: 2020-09-26 | Disposition: A | Discharge status: Home / Self Care | Payer: Medicare Other | Source: Ambulatory Visit | Attending: Oncology | Admitting: Oncology

## 2020-09-26 ENCOUNTER — Other Ambulatory Visit: Payer: Self-pay

## 2020-09-26 DIAGNOSIS — C50412 Malignant neoplasm of upper-outer quadrant of left female breast: Secondary | ICD-10-CM

## 2020-09-26 DIAGNOSIS — C50912 Malignant neoplasm of unspecified site of left female breast: Secondary | ICD-10-CM

## 2020-09-26 DIAGNOSIS — Z170 Estrogen receptor positive status [ER+]: Secondary | ICD-10-CM

## 2020-09-26 DIAGNOSIS — C50111 Malignant neoplasm of central portion of right female breast: Secondary | ICD-10-CM

## 2020-09-26 MED ORDER — GADOBUTROL 1 MMOL/ML IV SOLN
7.0000 mL | Freq: Once | INTRAVENOUS | Status: AC | PRN
  Administered 2020-09-26: 7 mL via INTRAVENOUS

## 2020-09-28 ENCOUNTER — Other Ambulatory Visit: Payer: Self-pay | Admitting: Oncology

## 2021-03-20 ENCOUNTER — Encounter: Payer: Self-pay | Admitting: Oncology

## 2021-03-21 ENCOUNTER — Other Ambulatory Visit: Payer: Self-pay

## 2021-03-21 ENCOUNTER — Telehealth: Payer: Self-pay | Admitting: Gastroenterology

## 2021-03-21 DIAGNOSIS — Z170 Estrogen receptor positive status [ER+]: Secondary | ICD-10-CM

## 2021-03-21 NOTE — Telephone Encounter (Signed)
Hi Dr. Loletha Carrow,    We received a referral for patient to have another colonoscopy. Patient brought in the report from 2017 for you to review and advise on scheduling.    Thanks

## 2021-03-21 NOTE — Progress Notes (Signed)
[place]  [phone] [phone]     ID: [name]   DOB: 01-26-1956  MR#: [id number]  IHK#:[id number]  Patient Care Team: Levin Erp, MD as PCP - General (Internal Medicine) Ronav Furney, Virgie Dad, MD as Consulting Physician (Oncology) Norma Fredrickson, MD as Consulting Physician (Psychiatry) [name], MD as Consulting Physician (Orthopedic Surgery) Azucena Fallen, MD as Consulting Physician (Obstetrics and Gynecology) Gery Pray, MD as Consulting Physician (Radiation Oncology) Jovita Kussmaul, MD as Consulting Physician (General Surgery)  OTHER MD: Manon Hilding   CHIEF COMPLAINT: Estrogen receptor positive breast cancer in BRCA-positive patient  CURRENT TREATMENT: Observation   INTERVAL HISTORY: [name] returns today for follow-up of her contralateral breast cancer.   She started examestane on 01/01/2019.  She is paying approximately $70 a month for this.  It is causing her some vaginal dryness issues.  It is also likely contributing to her osteoporosis problems.    As part of her intensified screening she underwent breast MRI on 09/26/2020 showing: breast composition C; no evidence to suggest malignancy in either breast.  This is improved from her prior density D.  This was followed by bilateral diagnostic mammography at [facility] 03/20/2021, showing breast composition C, no mammographic evidence of malignancy.   REVIEW OF SYSTEMS: [name] walks her dog on a daily basis.  She enjoys playing bridge.  A detailed review of systems today was otherwise benign.   COVID 19 VACCINATION STATUS: Status post Pfizer x2 plus booster   HISTORY OF BREAST CANCER: From the original intake note:   [name] had an unremarkable mammogram at [facility] on December 16, 2006.  Her breasts are dense, and of course that decreases the sensitivity.  In any case the screening mammogram was read as unremarkable.  In December, however, [name] had her annual  examination under [name], and Dr. Irven Baltimore palpated a lump in the left breast at the 3 oclock position.  The patient was referred back for ultrasound, and this showed a hypoechoic mass measuring 1.4 cm corresponding to the palpable abnormality.  Going back to the September mammogram, Dr. [name] was able to tease out what possibly could have been a mammographic finding, but really it was so equivocal, that even in retrospect, the mammogram was not informative.   This was followed up with breast specific gamma imaging on March 11, 2007, and this found a solitary lesion, measuring up to 1.7 cm.  In addition, there was a 1.0 cm low intensity uptake area in the axilla, considered an indeterminate finding.    With this information, the patient proceeded to biopsy under ultrasound guidance of the mass.  This was performed on December 9, and it showed an invasive ductal carcinoma, which was ER positive at 100%, PR positive at 56%, with a borderline/low proliferation marker at 17%, and Hercept test negative at 1+.  [name] was then referred to Dr. Hassell Done for further evaluation, and direction for definitive surgery, and she underwent left lumpectomy and sentinel lymph node biopsy March 13, 2007 for what proved to be ([id number]) a 2.1 cm invasive ductal carcinoma with some micro-papillary features, grade 2, with evidence of lymphovascular invasion, but zero of two sentinel lymph nodes involved.  Margins were adequate for both the invasive and in situ components.   Her subsequent history is as detailed below.   PAST MEDICAL HISTORY: Past Medical History:  Diagnosis Date   Anxiety    Anxiety disorder    BRCA2 positive 07/14/2012   Cancer ([place]) 2008  left breast   Dyslipidemia    Fall    Goiter    History of radiation therapy    Hypothyroidism    Osteoporosis     PAST SURGICAL HISTORY: Past Surgical History:  Procedure Laterality Date   ABDOMINAL HYSTERECTOMY  2009   with BSO    BREAST LUMPECTOMY  [id number], 1991   BREAST LUMPECTOMY WITH RADIOACTIVE SEED LOCALIZATION Right 10/02/2018   Procedure: RIGHT BREAST LUMPECTOMY WITH RADIOACTIVE SEED LOCALIZATION;  Surgeon: Jovita Kussmaul, MD;  Location: [facility];  Service: General;  Laterality: Right;   COLONOSCOPY N/A 12/13/2015   Procedure: COLONOSCOPY;  Surgeon: [name], MD;  Location: [facility];  Service: Endoscopy;  Laterality: N/A;   FRACTURE SURGERY  2015   left hip   HARDWARE REMOVAL Left 07/08/2015   Procedure: LEFT HIP HARDWARE REMOVAL;  Surgeon: [name], MD;  Location: [facility];  Service: Orthopedics;  Laterality: Left;    FAMILY HISTORY  Family History  Problem Relation Age of Onset   Breast cancer Mother    Colon cancer Father    Cancer Maternal Grandmother        unknown primary  The patients father is currently 76, with a remote history of colon cancer.  The patients mother died at the age of 15.  She had a history of breast cancer and the patients mothers mother died from cancer, but the actual original site of cancer is unclear.  The patient has several stepbrothers and sisters, but only one half-sister, currently 34, with no history of cancer.    GYNECOLOGIC HISTORY: She is GX, P2.  First pregnancy age 9.  Last menstrual period age 52.  She did not take hormone replacement.     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  Her husband [name] is a retired Consulting civil engineer.  He has 4 children from an earlier marriage. She has two from hers, a son who works at [employer] in [place], and a  son who lives in [place].  They have a total of 3 grandchildren.   ADVANCED DIRECTIVES: in place   HEALTH MAINTENANCE: Social History   Tobacco Use   Smoking status: Former    Types: Cigarettes   Smokeless tobacco: Never  Substance Use Topics   Alcohol use: Yes    Comment: GLASS OF WINE MOST DAYS   Drug use: No     Colonoscopy: August 2017/ [facility]  PAP: April 2014  Bone density: 2013 AT [facility]/ stable osteoporosis  Lipid panel:  Allergies  Allergen Reactions   Adhesive [Tape] Rash   Chloraprep One Step [Chlorhexidine Gluconate] Itching    Skin tears   Latex Rash   Betadine [Povidone Iodine]    Ampicillin Rash    Has patient had a PCN reaction causing immediate rash, facial/tongue/throat swelling, SOB or lightheadedness with hypotension: No Has patient had a PCN reaction causing severe rash involving mucus membranes or skin necrosis: No Has patient had a PCN reaction that required hospitalization No Has patient had a PCN reaction occurring within the last 10 years: No If all of the above answers are "NO", then may proceed with Cephalosporin use.   Macrodantin [Nitrofurantoin] Rash    Current Outpatient Medications  Medication Sig Dispense Refill   atorvastatin (LIPITOR) 10 MG tablet Take 5 mg by mouth daily.      Biotin 1 MG CAPS Take by mouth.     BL EVENING PRIMROSE OIL PO Take  by mouth.     Cholecalciferol (VITAMIN D3) 2000 UNITS TABS Take 1 tablet by mouth daily.      clorazepate (TRANXENE) 7.5 MG tablet Take 15 mg by mouth at bedtime.      exemestane (AROMASIN) 25 MG tablet Take 1 tablet (25 mg total) by mouth daily after breakfast. To start 01/15/2019 90 tablet 4   HYDROcodone-acetaminophen (NORCO/VICODIN) 5-325 MG tablet Take 1-2 tablets by mouth every 6 (six) hours as needed for moderate pain or severe pain. (Patient not taking: Reported on 10/29/2018) 15 tablet 0   ibuprofen (ADVIL,MOTRIN) 200 MG tablet Take 400 mg by mouth every 6 (six) hours as needed for mild pain.     levothyroxine (SYNTHROID, LEVOTHROID) 125 MCG tablet Take 125 mcg by mouth daily.       multivitamin-lutein (OCUVITE-LUTEIN) CAPS capsule Take 1 capsule by mouth daily.     Romosozumab-aqqg (EVENITY Badger) Inject into the skin.     traZODone (DESYREL) 100 MG tablet Take 100 mg by mouth at bedtime.     No current facility-administered medications  for this visit.    OBJECTIVE: white woman who appears younger than stated age  26:   03/22/21 1030  BP: 113/67  Pulse: (!) 56  Resp: 16  Temp: 97.7 F (36.5 C)  SpO2: 100%    Wt Readings from Last 3 Encounters:  03/22/21 145 lb 9.6 oz (66 kg)  03/22/20 142 lb 3.2 oz (64.5 kg)  03/12/19 142 lb 9.6 oz (64.7 kg)   Body mass index is 24.99 kg/m.    ECOG FS:1 - Symptomatic but completely ambulatory  Sclerae unicteric, EOMs intact Wearing a mask No cervical or supraclavicular adenopathy Lungs no rales or rhonchi Heart regular rate and rhythm Abd soft, nontender, positive bowel sounds MSK no focal spinal tenderness, no upper extremity lymphedema Neuro: nonfocal, well oriented, appropriate affect Breasts: Both breasts are status post lumpectomy and radiation.  The cosmetic result is good.  There is no evidence of local recurrence.  Both axillae are benign   LAB RESULTS: Lab Results  Component Value Date   WBC 5.1 03/22/2021   NEUTROABS 3.0 03/22/2021   HGB 13.5 03/22/2021   HCT 40.7 03/22/2021   MCV 87.9 03/22/2021   PLT 216 03/22/2021      Chemistry      Component Value Date/Time   NA 140 03/22/2021 1006   NA 138 02/15/2016 1229   K 4.2 03/22/2021 1006   K 4.5 02/15/2016 1229   CL 106 03/22/2021 1006   CL 106 07/16/2012 1201   CO2 27 03/22/2021 1006   CO2 26 02/15/2016 1229   BUN 12 03/22/2021 1006   BUN 13.7 02/15/2016 1229   CREATININE 1.04 (H) 03/22/2021 1006   CREATININE 0.9 02/15/2016 1229      Component Value Date/Time   CALCIUM 9.5 03/22/2021 1006   CALCIUM 10.4 02/15/2016 1229   ALKPHOS 58 03/22/2021 1006   ALKPHOS 54 02/15/2016 1229   AST 19 03/22/2021 1006   AST 22 02/15/2016 1229   ALT 20 03/22/2021 1006   ALT 25 02/15/2016 1229   BILITOT 0.5 03/22/2021 1006   BILITOT 0.39 02/15/2016 1229      Lab Results  Component Value Date   LABCA2 20 01/09/2012    STUDIES: No results found.   ASSESSMENT: 65 y.o. BRCA2 positive  [place] woman   (1)  status post left upper outer quadrant lumpectomy and sentinel lymph node biopsy December 2008 for a T2 N0 invasive ductal carcinoma,  grade 2, strongly estrogen and progesterone receptor positive, HER2 negative, with an MIB1 of 17%,  (a) Oncotype DX recurrence score of 20 predicted a 10-year risk of recurrence of 13% after 5 years on tamoxifen  (b) patient had taken tamoxifen for 5 years remotely for breast cancer prevention  (c) started Arimidex April 2009  (d) switched to tamoxifen January 2015  (e) switched to raloxifene January 2019--discontinued June 2020  (2) s/p TAH/BSO with benign pathology in July 2009  (3) on intensified screening, receiving mammography in December and breast MRI in May yearly  (4) right breast biopsy 09/10/2018 shows ductal carcinoma in situ, high-grade  (5) status post right lumpectomy 10/02/2018 for a pT1b pNX, stage I invasive ductal carcinoma, grade 2, with negative margins  (a) no sentinel lymph node sampling performed  (6) adjuvant radiation 11/06/2018 - 12/25/2018  (a) right breast / 50.4 Gy in 28 fractions  (b) right axilla / 45 Gy in 25 fractions  (c) right breast boost / 12 Gy in 6 fractions  (7) started exemestane 01/01/2019, discontinued December 2022   PLAN:  Yailine is now 14 years out from her left breast cancer and 2-1/2 years out from her right breast cancer. There is no evidence of disease activity. This is very favorable.  She has been on exemestane for the last 2 years.  This may have contributed to her decrease in breast density and that is favorable.  On the other hand it is causing vaginal dryness issues and it is contributing to her osteoporosis.  I do not have any data that it is doing anything positive at this point since she has had antiestrogens for so many years.  After discussion today we decided to stop.  We discussed the fact that both zoledronate and denosumab have been associated with decrease in  breast cancer risk so if she wishes to pursue those options both for the osteoporosis and breast cancer risk reduction we can certainly do those here at her discretion.  She is worried that if the cancer comes back she will need to have mastectomies but in many cases these days we are doing a lumpectomies on previously irradiated breasts and that particular standard of care (mastectomy with local recurrence in previously irradiated breast) may be changing.  I commended her exercise program.    We are continuing of course the intensified screening.  She will have her MRI of the breast in June and she will see [place] again shortly after that.  Likely visit will be yearly from that point as before  Total encounter time 25 minutes.*  Eron Goble, Virgie Dad, MD  03/22/21 11:04 AM Medical Oncology and Hematology [facility] [place], [place] [address] Tel. [phone]    Fax. [phone]   I, Wilburn Mylar, am acting as scribe for Dr. Virgie Dad. Thurman Sarver.  I, Lurline Del MD, have reviewed the above documentation for accuracy and completeness, and I agree with the above.   *Total Encounter Time as defined by the Centers for Medicare and Medicaid Services includes, in addition to the face-to-face time of a patient visit (documented in the note above) non-face-to-face time: obtaining and reviewing outside history, ordering and reviewing medications, tests or procedures, care coordination (communications with other health care professionals or caregivers) and documentation in the medical record.

## 2021-03-22 ENCOUNTER — Other Ambulatory Visit: Payer: Self-pay

## 2021-03-22 ENCOUNTER — Inpatient Hospital Stay: Payer: Medicare Other

## 2021-03-22 ENCOUNTER — Inpatient Hospital Stay: Payer: Medicare Other | Attending: Oncology | Admitting: Oncology

## 2021-03-22 VITALS — BP 113/67 | HR 56 | Temp 97.7°F | Resp 16 | Ht 64.0 in | Wt 145.6 lb

## 2021-03-22 DIAGNOSIS — Z170 Estrogen receptor positive status [ER+]: Secondary | ICD-10-CM

## 2021-03-22 DIAGNOSIS — Z9079 Acquired absence of other genital organ(s): Secondary | ICD-10-CM | POA: Insufficient documentation

## 2021-03-22 DIAGNOSIS — Z90710 Acquired absence of both cervix and uterus: Secondary | ICD-10-CM | POA: Insufficient documentation

## 2021-03-22 DIAGNOSIS — R5383 Other fatigue: Secondary | ICD-10-CM | POA: Diagnosis not present

## 2021-03-22 DIAGNOSIS — C50412 Malignant neoplasm of upper-outer quadrant of left female breast: Secondary | ICD-10-CM

## 2021-03-22 DIAGNOSIS — Z79899 Other long term (current) drug therapy: Secondary | ICD-10-CM | POA: Diagnosis not present

## 2021-03-22 DIAGNOSIS — Z923 Personal history of irradiation: Secondary | ICD-10-CM | POA: Insufficient documentation

## 2021-03-22 DIAGNOSIS — Z853 Personal history of malignant neoplasm of breast: Secondary | ICD-10-CM | POA: Insufficient documentation

## 2021-03-22 DIAGNOSIS — C50111 Malignant neoplasm of central portion of right female breast: Secondary | ICD-10-CM | POA: Diagnosis not present

## 2021-03-22 DIAGNOSIS — Z90722 Acquired absence of ovaries, bilateral: Secondary | ICD-10-CM | POA: Diagnosis not present

## 2021-03-22 LAB — CMP (CANCER CENTER ONLY)
ALT: 20 U/L (ref 0–44)
AST: 19 U/L (ref 15–41)
Albumin: 4.6 g/dL (ref 3.5–5.0)
Alkaline Phosphatase: 58 U/L (ref 38–126)
Anion gap: 7 (ref 5–15)
BUN: 12 mg/dL (ref 8–23)
CO2: 27 mmol/L (ref 22–32)
Calcium: 9.5 mg/dL (ref 8.9–10.3)
Chloride: 106 mmol/L (ref 98–111)
Creatinine: 1.04 mg/dL — ABNORMAL HIGH (ref 0.44–1.00)
GFR, Estimated: 59 mL/min — ABNORMAL LOW (ref >60)
Glucose, Bld: 96 mg/dL (ref 70–99)
Potassium: 4.2 mmol/L (ref 3.5–5.1)
Sodium: 140 mmol/L (ref 135–145)
Total Bilirubin: 0.5 mg/dL (ref 0.3–1.2)
Total Protein: 7.3 g/dL (ref 6.5–8.1)

## 2021-03-22 LAB — CBC WITH DIFFERENTIAL (CANCER CENTER ONLY)
Abs Immature Granulocytes: 0.01 10*3/uL (ref 0.00–0.07)
Basophils Absolute: 0.1 10*3/uL (ref 0.0–0.1)
Basophils Relative: 1 %
Eosinophils Absolute: 0.3 10*3/uL (ref 0.0–0.5)
Eosinophils Relative: 5 %
HCT: 40.7 % (ref 36.0–46.0)
Hemoglobin: 13.5 g/dL (ref 12.0–15.0)
Immature Granulocytes: 0 %
Lymphocytes Relative: 27 %
Lymphs Abs: 1.4 10*3/uL (ref 0.7–4.0)
MCH: 29.2 pg (ref 26.0–34.0)
MCHC: 33.2 g/dL (ref 30.0–36.0)
MCV: 87.9 fL (ref 80.0–100.0)
Monocytes Absolute: 0.4 10*3/uL (ref 0.1–1.0)
Monocytes Relative: 8 %
Neutro Abs: 3 10*3/uL (ref 1.7–7.7)
Neutrophils Relative %: 59 %
Platelet Count: 216 10*3/uL (ref 150–400)
RBC: 4.63 MIL/uL (ref 3.87–5.11)
RDW: 12.4 % (ref 11.5–15.5)
WBC Count: 5.1 10*3/uL (ref 4.0–10.5)
nRBC: 0 % (ref 0.0–0.2)

## 2021-03-22 MED ORDER — EXEMESTANE 25 MG PO TABS: 25.0000 mg | ORAL_TABLET | Freq: Every day | ORAL | 4 refills | Status: DC

## 2021-03-22 NOTE — Addendum Note (Signed)
Addended by: Chauncey Cruel on: 03/22/2021 04:14 PM   Modules accepted: Orders

## 2021-03-28 NOTE — Telephone Encounter (Signed)
(  For documentation purposes: This patient had a colonoscopy with Dr. Earle Gell on 12/13/2015, indication history of colon cancer, father diagnosed with CRC under age 66.  Complete exam to cecum, bowel preparation reportedly good. Normal exam.)   This patient is due for a colonoscopy due to family history of colon cancer.  It can be scheduled directly with me through [facility] previsit.  Please help make the arrangements.  Madelon Lips, MD

## 2021-03-29 ENCOUNTER — Encounter: Payer: Self-pay | Admitting: Gastroenterology

## 2021-04-02 HISTORY — PX: COLONOSCOPY: SHX174

## 2021-04-26 ENCOUNTER — Ambulatory Visit (AMBULATORY_SURGERY_CENTER): Payer: Medicare Other | Admitting: *Deleted

## 2021-04-26 ENCOUNTER — Other Ambulatory Visit: Payer: Self-pay

## 2021-04-26 VITALS — Ht 64.0 in | Wt 149.0 lb

## 2021-04-26 DIAGNOSIS — Z800 Family history of malignant neoplasm of digestive organs: Secondary | ICD-10-CM

## 2021-04-26 MED ORDER — NA SULFATE-K SULFATE-MG SULF 17.5-3.13-1.6 GM/177ML PO SOLN: 1.0000 | ORAL | 0 refills | Status: DC

## 2021-04-26 NOTE — Progress Notes (Deleted)
.  sutabc

## 2021-04-26 NOTE — Progress Notes (Signed)
Patient's pre-visit was done today over the phone with the patient. Name,DOB and address verified. Patient denies any allergies to Eggs and Soy. Patient denies any problems with anesthesia/sedation. Patient is not taking any diet pills or blood thinners. No home Oxygen.   Prep instructions sent to pt's MyChart-pt aware. Patient understands to call us back with any questions or concerns. Patient is aware of our care-partner policy and [id number] safety protocol. Pt will use singlecare for suprep rx.  EMMI education assigned to the patient for the procedure, sent to [place].   The patient is COVID-19 vaccinated.

## 2021-05-05 ENCOUNTER — Other Ambulatory Visit: Payer: Self-pay

## 2021-05-05 ENCOUNTER — Ambulatory Visit (AMBULATORY_SURGERY_CENTER): Payer: Medicare Other | Admitting: Gastroenterology

## 2021-05-05 ENCOUNTER — Encounter: Payer: Self-pay | Admitting: Gastroenterology

## 2021-05-05 VITALS — BP 136/61 | HR 56 | Temp 98.0°F | Resp 11 | Ht 64.0 in | Wt 149.0 lb

## 2021-05-05 DIAGNOSIS — Z800 Family history of malignant neoplasm of digestive organs: Secondary | ICD-10-CM | POA: Diagnosis not present

## 2021-05-05 DIAGNOSIS — Z1211 Encounter for screening for malignant neoplasm of colon: Secondary | ICD-10-CM | POA: Diagnosis not present

## 2021-05-05 DIAGNOSIS — D120 Benign neoplasm of cecum: Secondary | ICD-10-CM

## 2021-05-05 MED ORDER — SODIUM CHLORIDE 0.9 % IV SOLN: 500.0000 mL | INTRAVENOUS | Status: DC

## 2021-05-05 NOTE — Progress Notes (Signed)
Pt non-responsive, VVS, Report to RN  °

## 2021-05-05 NOTE — Patient Instructions (Signed)
Resume previous diet and medications. Awaiting pathology results. Repeat Colonoscopy in 5 years for screening purposes.  YOU HAD AN ENDOSCOPIC PROCEDURE TODAY AT [facility]:   Refer to the procedure report that was given to you for any specific questions about what was found during the examination.  If the procedure report does not answer your questions, please call your gastroenterologist to clarify.  If you requested that your care partner not be given the details of your procedure findings, then the procedure report has been included in a sealed envelope for you to review at your convenience later.  YOU SHOULD EXPECT: Some feelings of bloating in the abdomen. Passage of more gas than usual.  Walking can help get rid of the air that was put into your GI tract during the procedure and reduce the bloating. If you had a lower endoscopy (such as a colonoscopy or flexible sigmoidoscopy) you may notice spotting of blood in your stool or on the toilet paper. If you underwent a bowel prep for your procedure, you may not have a normal bowel movement for a few days.  Please Note:  You might notice some irritation and congestion in your nose or some drainage.  This is from the oxygen used during your procedure.  There is no need for concern and it should clear up in a day or so.  SYMPTOMS TO REPORT IMMEDIATELY:  Following lower endoscopy (colonoscopy or flexible sigmoidoscopy):  Excessive amounts of blood in the stool  Significant tenderness or worsening of abdominal pains  Swelling of the abdomen that is new, acute  Fever of 100F or higher  For urgent or emergent issues, a gastroenterologist can be reached at any hour by calling [phone]. Do not use MyChart messaging for urgent concerns.    DIET:  We do recommend a small meal at first, but then you may proceed to your regular diet.  Drink plenty of fluids but you should avoid alcoholic beverages for 24 hours.  ACTIVITY:  You  should plan to take it easy for the rest of today and you should NOT DRIVE or use heavy machinery until tomorrow (because of the sedation medicines used during the test).    FOLLOW UP: Our staff will call the number listed on your records 48-72 hours following your procedure to check on you and address any questions or concerns that you may have regarding the information given to you following your procedure. If we do not reach you, we will leave a message.  We will attempt to reach you two times.  During this call, we will ask if you have developed any symptoms of COVID 19. If you develop any symptoms (ie: fever, flu-like symptoms, shortness of breath, cough etc.) before then, please call [phone].  If you test positive for Covid 19 in the 2 weeks post procedure, please call and report this information to [place].    If any biopsies were taken you will be contacted by phone or by letter within the next 1-3 weeks.  Please call us at [phone] if you have not heard about the biopsies in 3 weeks.    SIGNATURES/CONFIDENTIALITY: You and/or your care partner have signed paperwork which will be entered into your electronic medical record.  These signatures attest to the fact that that the information above on your After Visit Summary has been reviewed and is understood.  Full responsibility of the confidentiality of this discharge information lies with you and/or your care-partner.

## 2021-05-05 NOTE — Progress Notes (Signed)
Called to room to assist during endoscopic procedure.  Patient ID and intended procedure confirmed with present staff. Received instructions for my participation in the procedure from the performing physician.  

## 2021-05-05 NOTE — Op Note (Signed)
[place] Patient Name: [name] Procedure Date: 05/05/2021 11:20 AM MRN: [id number] Endoscopist: [place]. Loletha Carrow , MD Age: 66 Referring MD:  Date of Birth: 09-13-1955 Gender: Female Account #: [id number] Procedure:                Colonoscopy Indications:              Colon cancer screening in patient at increased                            risk: Colorectal cancer in father                           No polyps last colonoscopy 12/2015 (Dr. Earle Gell) Medicines:                Monitored Anesthesia Care Procedure:                Pre-Anesthesia Assessment:                           - Prior to the procedure, a History and Physical                            was performed, and patient medications and                            allergies were reviewed. The patient's tolerance of                            previous anesthesia was also reviewed. The risks                            and benefits of the procedure and the sedation                            options and risks were discussed with the patient.                            All questions were answered, and informed consent                            was obtained. Prior Anticoagulants: The patient has                            taken no previous anticoagulant or antiplatelet                            agents. ASA Grade Assessment: II - A patient with                            mild systemic disease. After reviewing the risks  and benefits, the patient was deemed in                            satisfactory condition to undergo the procedure.                           After obtaining informed consent, the colonoscope                            was passed under direct vision. Throughout the                            procedure, the patient's blood pressure, pulse, and                            oxygen saturations were monitored continuously. The                             CF [id number] #[id number] was introduced through the anus                            and advanced to the the cecum, identified by                            appendiceal orifice and ileocecal valve. The                            colonoscopy was performed without difficulty. The                            patient tolerated the procedure well. The quality                            of the bowel preparation was excellent. The                            ileocecal valve, appendiceal orifice, and rectum                            were photographed. Scope In: 11:39:19 AM Scope Out: 11:54:14 AM Scope Withdrawal Time: 0 hours 11 minutes 45 seconds  Total Procedure Duration: 0 hours 14 minutes 55 seconds  Findings:                 The perianal and digital rectal examinations were                            normal.                           A 4 mm polyp was found in the cecum. The polyp was                            semi-sessile. The polyp was removed with a cold  snare. Resection and retrieval were complete.                           Repeat examination of right colon under NBI                            performed.                           The exam was otherwise without abnormality on                            direct and retroflexion views. Complications:            No immediate complications. Estimated Blood Loss:     Estimated blood loss was minimal. Impression:               - One 4 mm polyp in the cecum, removed with a cold                            snare. Resected and retrieved.                           - The examination was otherwise normal on direct                            and retroflexion views. Recommendation:           - Patient has a contact number available for                            emergencies. The signs and symptoms of potential                            delayed complications were discussed with the                            patient. Return to  normal activities tomorrow.                            Written discharge instructions were provided to the                            patient.                           - Resume previous diet.                           - Continue present medications.                           - Await pathology results.                           - Repeat colonoscopy in 5 years for surveillance  and family history of colon cancer. Kehinde Bowdish L. Loletha Carrow, MD 05/05/2021 11:59:19 AM This report has been signed electronically.

## 2021-05-05 NOTE — Progress Notes (Signed)
Pt's states no medical or surgical changes since previsit or office visit. 

## 2021-05-05 NOTE — Progress Notes (Signed)
History and Physical:  This patient presents for endoscopic testing for: Encounter Diagnosis  Name Primary?   Family history of colon cancer Yes    Last colonoscopy Sept 2017 Patient denies chronic abdominal pain, rectal bleeding, constipation or diarrhea.   ROS: Patient denies chest pain or cough   Past Medical History: Past Medical History:  Diagnosis Date   Anxiety    Anxiety disorder    BRCA2 positive 07/14/2012   Cancer ([place]) 2008   left breast   Dyslipidemia    Fall    Goiter    History of radiation therapy    Hypothyroidism    Osteoporosis      Past Surgical History: Past Surgical History:  Procedure Laterality Date   ABDOMINAL HYSTERECTOMY  2009   with BSO   BREAST LUMPECTOMY  9833,8250, 1991   BREAST LUMPECTOMY WITH RADIOACTIVE SEED LOCALIZATION Right 10/02/2018   Procedure: RIGHT BREAST LUMPECTOMY WITH RADIOACTIVE SEED LOCALIZATION;  Surgeon: Jovita Kussmaul, MD;  Location: [facility];  Service: General;  Laterality: Right;   COLONOSCOPY N/A 12/13/2015   Procedure: COLONOSCOPY;  Surgeon: [name], MD;  [facility];  Service: Endoscopy;  Laterality: N/A;   FRACTURE SURGERY  2015   left hip   HARDWARE REMOVAL Left 07/08/2015   Procedure: LEFT HIP HARDWARE REMOVAL;  Surgeon: [name], MD;  Location: [facility];  Service: Orthopedics;  Laterality: Left;    Allergies: Allergies  Allergen Reactions   Adhesive [Tape] Rash   Chloraprep One Step [Chlorhexidine Gluconate] Itching    Skin tears   Latex Rash   Betadine [Povidone Iodine]    Cephalexin Other (See Comments)   Ampicillin Rash    Has patient had a PCN reaction causing immediate rash, facial/tongue/throat swelling, SOB or lightheadedness with hypotension: No Has patient had a PCN reaction causing severe rash involving mucus membranes or skin necrosis: No Has patient had a PCN reaction that required hospitalization No Has patient had a PCN reaction occurring within the  last 10 years: No If all of the above answers are "NO", then may proceed with Cephalosporin use.   Macrodantin [Nitrofurantoin] Rash    Outpatient Meds: Current Outpatient Medications  Medication Sig Dispense Refill   atorvastatin (LIPITOR) 10 MG tablet Take 5 mg by mouth daily.      Biotin 1 MG CAPS Take by mouth.     BL EVENING PRIMROSE OIL PO Take by mouth.     CALCIUM PO Take by mouth.     clorazepate (TRANXENE) 7.5 MG tablet Take 15 mg by mouth at bedtime.      levothyroxine (SYNTHROID, LEVOTHROID) 125 MCG tablet Take 125 mcg by mouth daily.       Na Sulfate-K Sulfate-Mg Sulf 17.5-3.13-1.6 GM/177ML SOLN Take 1 kit by mouth as directed. May use generic Suprep 354 mL 0   traZODone (DESYREL) 100 MG tablet Take 100 mg by mouth at bedtime.     VITAMIN D PO Take by mouth. drops     Current Facility-Administered Medications  Medication Dose Route Frequency Provider Last Rate Last Admin   0.9 %  sodium chloride infusion  500 mL Intravenous Continuous Nelida Meuse III, MD          ___________________________________________________________________ Objective   Exam:  BP 131/76    Pulse 62    Temp 98 F (36.7 C) (Temporal)    Ht 5' 4"  (1.626 m)    Wt 149 lb (67.6 kg)    SpO2 98%  BMI 25.58 kg/m   CV: RRR without murmur, S1/S2 Resp: clear to auscultation bilaterally, normal RR and effort noted GI: soft, no tenderness, with active bowel sounds.   Assessment: Encounter Diagnosis  Name Primary?   Family history of colon cancer Yes     Plan: Colonoscopy  The benefits and risks of the planned procedure were described in detail with the patient or (when appropriate) their health care proxy.  Risks were outlined as including, but not limited to, bleeding, infection, perforation, adverse medication reaction leading to cardiac or pulmonary decompensation, pancreatitis (if ERCP).  The limitation of incomplete mucosal visualization was also discussed.  No guarantees or warranties  were given.    The patient is appropriate for an endoscopic procedure in the ambulatory setting.   - Wilfrid Lund, MD

## 2021-05-09 ENCOUNTER — Telehealth: Payer: Self-pay

## 2021-05-09 ENCOUNTER — Telehealth: Payer: Self-pay | Admitting: *Deleted

## 2021-05-09 NOTE — Telephone Encounter (Signed)
°  Follow up Call-  Call back number 05/05/2021  Post procedure Call Back phone  # [phone]  Permission to leave phone message Yes  Some recent data might be hidden     Patient questions:  Do you have a fever, pain , or abdominal swelling? No. Pain Score  0 *  Have you tolerated food without any problems? Yes.    Have you been able to return to your normal activities? Yes.    Do you have any questions about your discharge instructions: Diet   No. Medications  No. Follow up visit  No.  Do you have questions or concerns about your Care? No.  Actions: * If pain score is 4 or above: No action needed, pain <4.  Have you developed a fever since your procedure? no  2.   Have you had an respiratory symptoms (SOB or cough) since your procedure? no  3.   Have you tested positive for COVID 19 since your procedure no  4.   Have you had any family members/close contacts diagnosed with the COVID 19 since your procedure?  no   If yes to any of these questions please route to Joylene John, RN and Joella Prince, RN

## 2021-05-09 NOTE — Telephone Encounter (Signed)
°  Follow up Call-  Call back number 05/05/2021  Post procedure Call Back phone  # [phone]  Permission to leave phone message Yes  Some recent data might be hidden     First follow up call, LVM

## 2021-05-11 ENCOUNTER — Encounter: Payer: Self-pay | Admitting: Gastroenterology

## 2021-08-16 ENCOUNTER — Other Ambulatory Visit: Payer: Self-pay | Admitting: Hematology

## 2021-08-16 DIAGNOSIS — Z170 Estrogen receptor positive status [ER+]: Secondary | ICD-10-CM

## 2021-08-18 ENCOUNTER — Ambulatory Visit
Admission: RE | Admit: 2021-08-18 | Discharge: 2021-08-18 | Disposition: A | Discharge status: Home / Self Care | Payer: Medicare Other | Source: Ambulatory Visit | Attending: Geriatric Medicine | Admitting: Geriatric Medicine

## 2021-08-18 ENCOUNTER — Other Ambulatory Visit: Payer: Self-pay | Admitting: Geriatric Medicine

## 2021-08-18 DIAGNOSIS — M545 Low back pain, unspecified: Secondary | ICD-10-CM

## 2021-09-05 ENCOUNTER — Ambulatory Visit
Admission: RE | Admit: 2021-09-05 | Discharge: 2021-09-05 | Disposition: A | Discharge status: Home / Self Care | Payer: Medicare Other | Source: Ambulatory Visit | Attending: Hematology | Admitting: Hematology

## 2021-09-05 DIAGNOSIS — C50412 Malignant neoplasm of upper-outer quadrant of left female breast: Secondary | ICD-10-CM

## 2021-09-05 MED ORDER — GADOBUTROL 1 MMOL/ML IV SOLN
7.0000 mL | Freq: Once | INTRAVENOUS | Status: AC | PRN
  Administered 2021-09-05: 7 mL via INTRAVENOUS

## 2021-09-15 ENCOUNTER — Other Ambulatory Visit: Payer: Self-pay

## 2021-09-15 DIAGNOSIS — Z170 Estrogen receptor positive status [ER+]: Secondary | ICD-10-CM

## 2021-09-15 DIAGNOSIS — C50912 Malignant neoplasm of unspecified site of left female breast: Secondary | ICD-10-CM

## 2021-09-18 ENCOUNTER — Other Ambulatory Visit: Payer: Self-pay

## 2021-09-18 ENCOUNTER — Encounter: Payer: Self-pay | Admitting: Hematology

## 2021-09-18 ENCOUNTER — Inpatient Hospital Stay (HOSPITAL_BASED_OUTPATIENT_CLINIC_OR_DEPARTMENT_OTHER): Payer: Medicare Other | Admitting: Hematology

## 2021-09-18 ENCOUNTER — Inpatient Hospital Stay: Payer: Medicare Other | Attending: Hematology

## 2021-09-18 VITALS — BP 127/68 | HR 56 | Temp 97.8°F | Resp 19 | Ht 64.0 in | Wt 148.6 lb

## 2021-09-18 DIAGNOSIS — C50111 Malignant neoplasm of central portion of right female breast: Secondary | ICD-10-CM | POA: Diagnosis not present

## 2021-09-18 DIAGNOSIS — Z9079 Acquired absence of other genital organ(s): Secondary | ICD-10-CM | POA: Diagnosis not present

## 2021-09-18 DIAGNOSIS — Z15060 Genetic susceptibility to colorectal cancer: Secondary | ICD-10-CM

## 2021-09-18 DIAGNOSIS — Z90722 Acquired absence of ovaries, bilateral: Secondary | ICD-10-CM | POA: Diagnosis not present

## 2021-09-18 DIAGNOSIS — C50412 Malignant neoplasm of upper-outer quadrant of left female breast: Secondary | ICD-10-CM

## 2021-09-18 DIAGNOSIS — C50912 Malignant neoplasm of unspecified site of left female breast: Secondary | ICD-10-CM

## 2021-09-18 DIAGNOSIS — Z853 Personal history of malignant neoplasm of breast: Secondary | ICD-10-CM | POA: Insufficient documentation

## 2021-09-18 DIAGNOSIS — Z9013 Acquired absence of bilateral breasts and nipples: Secondary | ICD-10-CM | POA: Diagnosis not present

## 2021-09-18 DIAGNOSIS — Z923 Personal history of irradiation: Secondary | ICD-10-CM | POA: Diagnosis not present

## 2021-09-18 DIAGNOSIS — Z90710 Acquired absence of both cervix and uterus: Secondary | ICD-10-CM | POA: Diagnosis not present

## 2021-09-18 DIAGNOSIS — Z1501 Genetic susceptibility to malignant neoplasm of breast: Secondary | ICD-10-CM

## 2021-09-18 DIAGNOSIS — Z170 Estrogen receptor positive status [ER+]: Secondary | ICD-10-CM

## 2021-09-18 DIAGNOSIS — Z79899 Other long term (current) drug therapy: Secondary | ICD-10-CM | POA: Diagnosis not present

## 2021-09-18 LAB — CMP (CANCER CENTER ONLY)
ALT: 13 U/L (ref 0–44)
AST: 16 U/L (ref 15–41)
Albumin: 4.4 g/dL (ref 3.5–5.0)
Alkaline Phosphatase: 55 U/L (ref 38–126)
Anion gap: 3 — ABNORMAL LOW (ref 5–15)
BUN: 14 mg/dL (ref 8–23)
CO2: 30 mmol/L (ref 22–32)
Calcium: 9.8 mg/dL (ref 8.9–10.3)
Chloride: 105 mmol/L (ref 98–111)
Creatinine: 0.88 mg/dL (ref 0.44–1.00)
GFR, Estimated: >60 mL/min (ref >60)
Glucose, Bld: 104 mg/dL — ABNORMAL HIGH (ref 70–99)
Potassium: 4.2 mmol/L (ref 3.5–5.1)
Sodium: 138 mmol/L (ref 135–145)
Total Bilirubin: 0.4 mg/dL (ref 0.3–1.2)
Total Protein: 7.2 g/dL (ref 6.5–8.1)

## 2021-09-18 LAB — CBC WITH DIFFERENTIAL (CANCER CENTER ONLY)
Abs Immature Granulocytes: 0.02 10*3/uL (ref 0.00–0.07)
Basophils Absolute: 0.1 10*3/uL (ref 0.0–0.1)
Basophils Relative: 1 %
Eosinophils Absolute: 0.7 10*3/uL — ABNORMAL HIGH (ref 0.0–0.5)
Eosinophils Relative: 12 %
HCT: 39.2 % (ref 36.0–46.0)
Hemoglobin: 13.3 g/dL (ref 12.0–15.0)
Immature Granulocytes: 0 %
Lymphocytes Relative: 26 %
Lymphs Abs: 1.5 10*3/uL (ref 0.7–4.0)
MCH: 29.5 pg (ref 26.0–34.0)
MCHC: 33.9 g/dL (ref 30.0–36.0)
MCV: 86.9 fL (ref 80.0–100.0)
Monocytes Absolute: 0.4 10*3/uL (ref 0.1–1.0)
Monocytes Relative: 7 %
Neutro Abs: 3.2 10*3/uL (ref 1.7–7.7)
Neutrophils Relative %: 54 %
Platelet Count: 211 10*3/uL (ref 150–400)
RBC: 4.51 MIL/uL (ref 3.87–5.11)
RDW: 12.3 % (ref 11.5–15.5)
WBC Count: 5.9 10*3/uL (ref 4.0–10.5)
nRBC: 0 % (ref 0.0–0.2)

## 2021-09-18 NOTE — Progress Notes (Signed)
[facility]   [phone] [phone]   Clinic Follow up Note   Patient Care Team: Lajean Manes, MD as PCP - General (Internal Medicine) Norma Fredrickson, MD as Consulting Physician (Psychiatry) [name], MD as Consulting Physician (Orthopedic Surgery) Azucena Fallen, MD as Consulting Physician (Obstetrics and Gynecology) Gery Pray, MD as Consulting Physician (Radiation Oncology) Jovita Kussmaul, MD as Consulting Physician (General Surgery) Truitt Merle, MD as Consulting Physician (Hematology)  Date of Service:  09/18/2021  CHIEF COMPLAINT: f/u of left breast cancer, BRCA2+  CURRENT THERAPY:  Surveillance  ASSESSMENT & PLAN:  [name] is a 66 y.o. post-hysterectomy female with   1. Malignant neoplasm of central portion of right breast, stage 1, pT1b, cN0 IDC, grade 2 -I have reviewed her medical records extensively.  Initially diagnosed with DCIS 09/10/18. Lumpectomy 10/02/18 showed invasive ductal carcinoma. S/p radiation 11/06/18 - 12/25/18. Took exemestane 01/2019 - 03/2021, discontinued due to osteoporosis and small benefit. -most recent MM 03/20/21 and breast MRI 09/05/21 were both negative. -she is clinically doing very well. Labs reviewed, WNL. Physical exam was unremarkable. There is no clinical concern for recurrence. -continue surveillance; she is very diligent with annual MM and MRI. -We again reviewed risk of recurrence from previous breast cancers, and things to watch at home. -f/u in 6 months  2. BRCA2+ -found to be BRCA2+ at time of first breast cancer. Bilateral mastectomies not felt to be necessary at that time, but she did undergo TAH w/ BSO 10/2007. -one son is positive, other son has not been tested. -Again reviewed her future breast cancer. -No family history of pancreatic cancer, no need routine screening.  3. Osteoporosis -most recent DEXA 03/23/21 showed T-score -2.9 at right hip. Though density in lumbar spine  worsened some, her hip slightly improved from -3.1 in 03/2020. -she was previously on fosamax for many years then prolia injections for one year. -follows with Dr[facility]uddie [facility]oco at [facility].  4. H/o Left breast stage T2 N0 IDC, ER+/PR+/Her2-  -s/p lumpectomy 03/2007. Oncotype RS 20, low risk. Took antiestrogens from 07/2007 - 09/2018 (Arimidex, tamoxifen, and raloxifene). -I discussed her risk of recurrence in left breast is very low now.   PLAN: -continue surveillance, next MM 03/2022 -lab and f/u with NP Lacie in 6 months   No problem-specific Assessment & Plan notes found for this encounter.   SUMMARY OF ONCOLOGIC HISTORY: Oncology History  Malignant neoplasm of central portion of right breast in female, estrogen receptor positive ([place])  09/12/2018 Initial Diagnosis   Ductal carcinoma in situ (DCIS) of right breast   09/17/2018 Cancer Staging   Staging form: Breast, AJCC 8th Edition - Clinical: Stage 0 (cTis (DCIS), cN0, cM0, ER+, PR+, HER2: Not Assessed) - Signed by Gardenia Phlegm, NP on 09/17/2018   10/02/2018 Cancer Staging   Staging form: Breast, AJCC 8th Edition - Pathologic stage from 10/02/2018: Stage IA (pT1b, pN0, cM0, G2, ER+, PR+, HER2-) - Signed by Gardenia Phlegm, NP on 10/15/2018      INTERVAL HISTORY:  [name] is here for a follow up of breast cancer. She was last seen by Dr. Jana Hakim on 03/22/21. She presents to the clinic alone. She reports she is doing well overall, no new concerns.   All other systems were reviewed with the patient and are negative.  MEDICAL HISTORY:  Past Medical History:  Diagnosis Date   Anxiety    Anxiety disorder    BRCA2 positive 07/14/2012   Cancer ([place])  2008   left breast   Dyslipidemia    Fall    Goiter    History of radiation therapy    Hypothyroidism    Osteoporosis     SURGICAL HISTORY: Past Surgical History:  Procedure Laterality Date   ABDOMINAL HYSTERECTOMY  2009   with BSO   BREAST LUMPECTOMY   [id number], 1991   BREAST LUMPECTOMY WITH RADIOACTIVE SEED LOCALIZATION Right 10/02/2018   Procedure: RIGHT BREAST LUMPECTOMY WITH RADIOACTIVE SEED LOCALIZATION;  Surgeon: Jovita Kussmaul, MD;  Location: [facility];  Service: General;  Laterality: Right;   COLONOSCOPY N/A 12/13/2015   Procedure: COLONOSCOPY;  Surgeon: [name], MD;  [facility];  Service: Endoscopy;  Laterality: N/A;   FRACTURE SURGERY  2015   left hip   HARDWARE REMOVAL Left 07/08/2015   Procedure: LEFT HIP HARDWARE REMOVAL;  Surgeon: [name], MD;  [facility];  Service: Orthopedics;  Laterality: Left;    I have reviewed the social history and family history with the patient and they are unchanged from previous note.  ALLERGIES:  is allergic to adhesive [tape], chloraprep one step [chlorhexidine gluconate], latex, betadine [povidone iodine], cephalexin, ampicillin, and macrodantin [nitrofurantoin].  MEDICATIONS:  Current Outpatient Medications  Medication Sig Dispense Refill   atorvastatin (LIPITOR) 10 MG tablet Take 5 mg by mouth daily.      Biotin 1 MG CAPS Take by mouth.     BL EVENING PRIMROSE OIL PO Take by mouth.     CALCIUM PO Take by mouth.     clorazepate (TRANXENE) 7.5 MG tablet Take 15 mg by mouth at bedtime.      levothyroxine (SYNTHROID, LEVOTHROID) 125 MCG tablet Take 125 mcg by mouth daily.       Na Sulfate-K Sulfate-Mg Sulf 17.5-3.13-1.6 GM/177ML SOLN Take 1 kit by mouth as directed. May use generic Suprep 354 mL 0   traZODone (DESYREL) 100 MG tablet Take 100 mg by mouth at bedtime.     VITAMIN D PO Take by mouth. drops     No current facility-administered medications for this visit.    PHYSICAL EXAMINATION: ECOG PERFORMANCE STATUS: 0 - Asymptomatic  Vitals:   09/18/21 1047  BP: 127/68  Pulse: (!) 56  Resp: 19  Temp: 97.8 F (36.6 C)  SpO2: 99%   Wt Readings from Last 3 Encounters:  09/18/21 148 lb 9.6 oz (67.4 kg)  05/05/21 149 lb (67.6 kg)   04/26/21 149 lb (67.6 kg)     GENERAL:alert, no distress and comfortable SKIN: skin color, texture, turgor are normal, no rashes or significant lesions EYES: normal, Conjunctiva are pink and non-injected, sclera clear  NECK: supple, thyroid normal size, non-tender, without nodularity LYMPH:  no palpable lymphadenopathy in the cervical, axillary LUNGS: clear to auscultation and percussion with normal breathing effort HEART: regular rate & rhythm and no murmurs and no lower extremity edema ABDOMEN:abdomen soft, non-tender and normal bowel sounds Musculoskeletal:no cyanosis of digits and no clubbing  NEURO: alert & oriented x 3 with fluent speech, no focal motor/sensory deficits BREAST: No palpable mass, nodules or adenopathy bilaterally. Breast exam benign.   LABORATORY DATA:  I have reviewed the data as listed    Latest Ref Rng & Units 09/18/2021   10:08 AM 03/22/2021   10:06 AM 03/22/2020   10:48 AM  CBC  WBC 4.0 - 10.5 K/uL 5.9  5.1  4.5   Hemoglobin 12.0 - 15.0 g/dL 13.3  13.5  13.5   Hematocrit 36.0 -  46.0 % 39.2  40.7  40.5   Platelets 150 - 400 K/uL 211  216  193         Latest Ref Rng & Units 09/18/2021   10:08 AM 03/22/2021   10:06 AM 03/22/2020   10:48 AM  CMP  Glucose 70 - 99 mg/dL 104  96  100   BUN 8 - 23 mg/dL _0 Creatinine 0.44 - 1.00 mg/dL 0.88  1.04  1.04   Sodium 135 - 145 mmol/L 138  140  138   Potassium 3.5 - 5.1 mmol/L 4.2  4.2  4.1   Chloride 98 - 111 mmol/L 105  106  104   CO2 22 - 32 mmol/L _1 Calcium 8.9 - 10.3 mg/dL 9.8  9.5  10.0   Total Protein 6.5 - 8.1 g/dL 7.2  7.3  7.4   Total Bilirubin 0.3 - 1.2 mg/dL 0.4  0.5  0.4   Alkaline Phos 38 - 126 U/L 55  58  60   AST 15 - 41 U/L _2 ALT 0 - 44 U/L 13  20  43       RADIOGRAPHIC STUDIES: I have personally reviewed the radiological images as listed and agreed with the findings in the report. No results found.    No orders of the defined types were placed in  this encounter.  All questions were answered. The patient knows to call the clinic with any problems, questions or concerns. No barriers to learning was detected. The total time spent in the appointment was 40 minutes.     Truitt Merle, MD 09/18/2021   I, Wilburn Mylar, am acting as scribe for Truitt Merle, MD.   I have reviewed the above documentation for accuracy and completeness, and I agree with the above.

## 2022-03-18 NOTE — Progress Notes (Unsigned)
[facility]   [phone] [phone]   Clinic Follow up Note   Patient Care Team: Stoneking, Hal, MD as PCP - General (Internal Medicine) Plovsky, Gerald, MD as Consulting Physician (Psychiatry) Aluisio, Frank, MD as Consulting Physician (Orthopedic Surgery) Mody, Vaishali, MD as Consulting Physician (Obstetrics and Gynecology) Kinard, James, MD as Consulting Physician (Radiation Oncology) Toth, Paul III, MD as Consulting Physician (General Surgery) Feng, Yan, MD as Consulting Physician (Hematology) 03/18/2022  CHIEF COMPLAINT: Follow up left breast cancer, BRCA2+  CURRENT THERAPY: Surveillance   INTERVAL HISTORY: [name] returns for follow up as scheduled. Last seen by Dr. Feng 09/18/21. She is diligent with mammogram and screening MRIs, next mammo due in December.    REVIEW OF SYSTEMS:   Constitutional: Denies fevers, chills or abnormal weight loss Eyes: Denies blurriness of vision Ears, nose, mouth, throat, and face: Denies mucositis or sore throat Respiratory: Denies cough, dyspnea or wheezes Cardiovascular: Denies palpitation, chest discomfort or lower extremity swelling Gastrointestinal:  Denies nausea, heartburn or change in bowel habits Skin: Denies abnormal skin rashes Lymphatics: Denies new lymphadenopathy or easy bruising Neurological:Denies numbness, tingling or new weaknesses Behavioral/Psych: Mood is stable, no new changes  All other systems were reviewed with the patient and are negative.  MEDICAL HISTORY:  Past Medical History:  Diagnosis Date   Anxiety    Anxiety disorder    BRCA2 positive 07/14/2012   Cancer ([facility]) 2008   left breast   Dyslipidemia    Fall    Goiter    History of radiation therapy    Hypothyroidism    Osteoporosis     SURGICAL HISTORY: Past Surgical History:  Procedure Laterality Date   ABDOMINAL HYSTERECTOMY  2009   with BSO   BREAST LUMPECTOMY  2008,1976, 1991   BREAST LUMPECTOMY WITH  RADIOACTIVE SEED LOCALIZATION Right 10/02/2018   Procedure: RIGHT BREAST LUMPECTOMY WITH RADIOACTIVE SEED LOCALIZATION;  Surgeon: Toth, Paul III, MD;  Location: [facility];  Service: General;  Laterality: Right;   COLONOSCOPY N/A 12/13/2015   Procedure: COLONOSCOPY;  Surgeon: Martin K Johnson, MD;  [facility];  Service: Endoscopy;  Laterality: N/A;   FRACTURE SURGERY  2015   left hip   HARDWARE REMOVAL Left 07/08/2015   Procedure: LEFT HIP HARDWARE REMOVAL;  Surgeon: Frank Aluisio, MD;  [facility];  Service: Orthopedics;  Laterality: Left;    I have reviewed the social history and family history with the patient and they are unchanged from previous note.  ALLERGIES:  is allergic to adhesive [tape], chloraprep one step [chlorhexidine gluconate], latex, betadine [povidone iodine], cephalexin, ampicillin, and macrodantin [nitrofurantoin].  MEDICATIONS:  Current Outpatient Medications  Medication Sig Dispense Refill   atorvastatin (LIPITOR) 10 MG tablet Take 5 mg by mouth daily.      Biotin 1 MG CAPS Take by mouth.     BL EVENING PRIMROSE OIL PO Take by mouth.     CALCIUM PO Take by mouth.     clorazepate (TRANXENE) 7.5 MG tablet Take 15 mg by mouth at bedtime.      levothyroxine (SYNTHROID, LEVOTHROID) 125 MCG tablet Take 125 mcg by mouth daily.       Na Sulfate-K Sulfate-Mg Sulf 17.5-3.13-1.6 GM/177ML SOLN Take 1 kit by mouth as directed. May use generic Suprep 354 mL 0   traZODone (DESYREL) 100 MG tablet Take 100 mg by mouth at bedtime.     VITAMIN D PO Take by mouth. drops     No current   facility-administered medications for this visit.    PHYSICAL EXAMINATION: ECOG PERFORMANCE STATUS: {CHL ONC ECOG PS:1154000200}  There were no vitals filed for this visit. There were no vitals filed for this visit.  GENERAL:alert, no distress and comfortable SKIN: skin color, texture, turgor are normal, no rashes or significant lesions EYES: normal, Conjunctiva are  pink and non-injected, sclera clear OROPHARYNX:no exudate, no erythema and lips, buccal mucosa, and tongue normal  NECK: supple, thyroid normal size, non-tender, without nodularity LYMPH:  no palpable lymphadenopathy in the cervical, axillary or inguinal LUNGS: clear to auscultation and percussion with normal breathing effort HEART: regular rate & rhythm and no murmurs and no lower extremity edema ABDOMEN:abdomen soft, non-tender and normal bowel sounds Musculoskeletal:no cyanosis of digits and no clubbing  NEURO: alert & oriented x 3 with fluent speech, no focal motor/sensory deficits  LABORATORY DATA:  I have reviewed the data as listed    Latest Ref Rng & Units 09/18/2021   10:08 AM 03/22/2021   10:06 AM 03/22/2020   10:48 AM  CBC  WBC 4.0 - 10.5 K/uL 5.9  5.1  4.5   Hemoglobin 12.0 - 15.0 g/dL 13.3  13.5  13.5   Hematocrit 36.0 - 46.0 % 39.2  40.7  40.5   Platelets 150 - 400 K/uL 211  216  193         Latest Ref Rng & Units 09/18/2021   10:08 AM 03/22/2021   10:06 AM 03/22/2020   10:48 AM  CMP  Glucose 70 - 99 mg/dL 104  96  100   BUN 8 - 23 mg/dL 14  12  16   Creatinine 0.44 - 1.00 mg/dL 0.88  1.04  1.04   Sodium 135 - 145 mmol/L 138  140  138   Potassium 3.5 - 5.1 mmol/L 4.2  4.2  4.1   Chloride 98 - 111 mmol/L 105  106  104   CO2 22 - 32 mmol/L 30  27  26   Calcium 8.9 - 10.3 mg/dL 9.8  9.5  10.0   Total Protein 6.5 - 8.1 g/dL 7.2  7.3  7.4   Total Bilirubin 0.3 - 1.2 mg/dL 0.4  0.5  0.4   Alkaline Phos 38 - 126 U/L 55  58  60   AST 15 - 41 U/L 16  19  27   ALT 0 - 44 U/L 13  20  43       RADIOGRAPHIC STUDIES: I have personally reviewed the radiological images as listed and agreed with the findings in the report. No results found.   ASSESSMENT & PLAN:  No problem-specific Assessment & Plan notes found for this encounter.   No orders of the defined types were placed in this encounter.  All questions were answered. The patient knows to call the clinic with  any problems, questions or concerns. No barriers to learning was detected. I spent {CHL ONC TIME VISIT - SWIFT:1154000130} counseling the patient face to face. The total time spent in the appointment was {CHL ONC TIME VISIT - SWIFT:1154000130} and more than 50% was on counseling and review of test results     Lacie K Burton, NP 03/18/22     

## 2022-03-20 ENCOUNTER — Encounter: Payer: Self-pay | Admitting: Nurse Practitioner

## 2022-03-20 ENCOUNTER — Inpatient Hospital Stay (HOSPITAL_BASED_OUTPATIENT_CLINIC_OR_DEPARTMENT_OTHER): Payer: Medicare Other | Admitting: Nurse Practitioner

## 2022-03-20 ENCOUNTER — Inpatient Hospital Stay: Payer: Medicare Other | Attending: Hematology

## 2022-03-20 VITALS — BP 138/63 | HR 57 | Temp 97.8°F | Resp 18 | Ht 64.0 in | Wt 151.6 lb

## 2022-03-20 DIAGNOSIS — Z170 Estrogen receptor positive status [ER+]: Secondary | ICD-10-CM

## 2022-03-20 DIAGNOSIS — C50111 Malignant neoplasm of central portion of right female breast: Secondary | ICD-10-CM

## 2022-03-20 DIAGNOSIS — Z853 Personal history of malignant neoplasm of breast: Secondary | ICD-10-CM | POA: Insufficient documentation

## 2022-03-20 DIAGNOSIS — C50912 Malignant neoplasm of unspecified site of left female breast: Secondary | ICD-10-CM

## 2022-03-20 DIAGNOSIS — M810 Age-related osteoporosis without current pathological fracture: Secondary | ICD-10-CM | POA: Insufficient documentation

## 2022-03-20 DIAGNOSIS — Z90710 Acquired absence of both cervix and uterus: Secondary | ICD-10-CM | POA: Diagnosis not present

## 2022-03-20 DIAGNOSIS — Z1501 Genetic susceptibility to malignant neoplasm of breast: Secondary | ICD-10-CM

## 2022-03-20 DIAGNOSIS — C50412 Malignant neoplasm of upper-outer quadrant of left female breast: Secondary | ICD-10-CM | POA: Diagnosis not present

## 2022-03-20 DIAGNOSIS — Z800 Family history of malignant neoplasm of digestive organs: Secondary | ICD-10-CM | POA: Diagnosis not present

## 2022-03-20 LAB — CBC WITH DIFFERENTIAL (CANCER CENTER ONLY)
Abs Immature Granulocytes: 0.02 10*3/uL (ref 0.00–0.07)
Basophils Absolute: 0.1 10*3/uL (ref 0.0–0.1)
Basophils Relative: 1 %
Eosinophils Absolute: 0.2 10*3/uL (ref 0.0–0.5)
Eosinophils Relative: 4 %
HCT: 38.5 % (ref 36.0–46.0)
Hemoglobin: 12.9 g/dL (ref 12.0–15.0)
Immature Granulocytes: 0 %
Lymphocytes Relative: 25 %
Lymphs Abs: 1.3 10*3/uL (ref 0.7–4.0)
MCH: 28.9 pg (ref 26.0–34.0)
MCHC: 33.5 g/dL (ref 30.0–36.0)
MCV: 86.3 fL (ref 80.0–100.0)
Monocytes Absolute: 0.4 10*3/uL (ref 0.1–1.0)
Monocytes Relative: 8 %
Neutro Abs: 3.2 10*3/uL (ref 1.7–7.7)
Neutrophils Relative %: 62 %
Platelet Count: 223 10*3/uL (ref 150–400)
RBC: 4.46 MIL/uL (ref 3.87–5.11)
RDW: 12.4 % (ref 11.5–15.5)
WBC Count: 5.2 10*3/uL (ref 4.0–10.5)
nRBC: 0 % (ref 0.0–0.2)

## 2022-03-20 LAB — CMP (CANCER CENTER ONLY)
ALT: 16 U/L (ref 0–44)
AST: 19 U/L (ref 15–41)
Albumin: 3.5 g/dL (ref 3.5–5.0)
Alkaline Phosphatase: 47 U/L (ref 38–126)
Anion gap: 7 (ref 5–15)
BUN: 16 mg/dL (ref 8–23)
CO2: 27 mmol/L (ref 22–32)
Calcium: 9.9 mg/dL (ref 8.9–10.3)
Chloride: 107 mmol/L (ref 98–111)
Creatinine: 1.05 mg/dL — ABNORMAL HIGH (ref 0.44–1.00)
GFR, Estimated: 58 mL/min — ABNORMAL LOW (ref >60)
Glucose, Bld: 106 mg/dL — ABNORMAL HIGH (ref 70–99)
Potassium: 4.4 mmol/L (ref 3.5–5.1)
Sodium: 141 mmol/L (ref 135–145)
Total Bilirubin: 0.6 mg/dL (ref 0.3–1.2)
Total Protein: 7 g/dL (ref 6.5–8.1)

## 2022-03-21 NOTE — Progress Notes (Signed)
Epic faxed order for MRI Bilateral Breast to [facility].  Stated that appt is not needed until June 2024.  Epic fax confirmation received.

## 2022-04-09 ENCOUNTER — Encounter: Payer: Self-pay | Admitting: Hematology

## 2022-06-10 DIAGNOSIS — Z1502 Genetic susceptibility to malignant neoplasm of ovary: Secondary | ICD-10-CM | POA: Insufficient documentation

## 2022-09-11 ENCOUNTER — Ambulatory Visit
Admission: RE | Admit: 2022-09-11 | Discharge: 2022-09-11 | Disposition: A | Discharge status: Home / Self Care | Payer: Medicare Other | Source: Ambulatory Visit | Attending: Nurse Practitioner | Admitting: Nurse Practitioner

## 2022-09-11 ENCOUNTER — Other Ambulatory Visit: Payer: Self-pay | Admitting: Nurse Practitioner

## 2022-09-11 ENCOUNTER — Ambulatory Visit (HOSPITAL_COMMUNITY)
Admission: RE | Admit: 2022-09-11 | Discharge: 2022-09-11 | Disposition: A | Discharge status: Home / Self Care | Payer: Medicare Other | Source: Ambulatory Visit | Attending: Nurse Practitioner | Admitting: Nurse Practitioner

## 2022-09-11 DIAGNOSIS — R928 Other abnormal and inconclusive findings on diagnostic imaging of breast: Secondary | ICD-10-CM | POA: Insufficient documentation

## 2022-09-11 DIAGNOSIS — R9389 Abnormal findings on diagnostic imaging of other specified body structures: Secondary | ICD-10-CM

## 2022-09-11 DIAGNOSIS — Z170 Estrogen receptor positive status [ER+]: Secondary | ICD-10-CM

## 2022-09-11 DIAGNOSIS — C50912 Malignant neoplasm of unspecified site of left female breast: Secondary | ICD-10-CM

## 2022-09-11 MED ORDER — GADOPICLENOL 0.5 MMOL/ML IV SOLN
6.0000 mL | Freq: Once | INTRAVENOUS | Status: AC | PRN
  Administered 2022-09-11: 6 mL via INTRAVENOUS

## 2022-09-11 NOTE — Progress Notes (Signed)
I reviewed screening breast MRI independently and with Dr. Mosetta Putt and discussed findings with pt. She agrees to proceed with MRI guided bx left br non-mass enhancement. Will proceed with CXR to further evaluate incidental R lung finding. Will follow results and call pt.   Santiago Glad, NP

## 2022-09-14 ENCOUNTER — Ambulatory Visit: Payer: BLUE CROSS/BLUE SHIELD

## 2022-09-14 ENCOUNTER — Ambulatory Visit
Admission: RE | Admit: 2022-09-14 | Discharge: 2022-09-14 | Disposition: A | Discharge status: Home / Self Care | Payer: Medicare Other | Source: Ambulatory Visit | Attending: Nurse Practitioner

## 2022-09-14 ENCOUNTER — Other Ambulatory Visit: Payer: Self-pay | Admitting: Nurse Practitioner

## 2022-09-14 DIAGNOSIS — R9389 Abnormal findings on diagnostic imaging of other specified body structures: Secondary | ICD-10-CM

## 2022-09-14 MED ORDER — GADOPICLENOL 0.5 MMOL/ML IV SOLN
7.0000 mL | Freq: Once | INTRAVENOUS | Status: AC | PRN
  Administered 2022-09-14: 7 mL via INTRAVENOUS

## 2022-09-16 NOTE — Progress Notes (Signed)
Patient Care Team: Erica Mood, MD as PCP - General (Hematology) Archer Asa, MD as Consulting Physician (Psychiatry) Ollen Gross, MD as Consulting Physician (Orthopedic Surgery) Shea Evans, MD as Consulting Physician (Obstetrics and Gynecology) Antony Blackbird, MD as Consulting Physician (Radiation Oncology) Griselda Miner, MD as Consulting Physician (General Surgery) Erica Mood, MD as Consulting Physician (Hematology) [facility] as Consulting Physician (Radiology)   CHIEF COMPLAINT: Follow up h/o bilateral breast cancer and BRCA2+  Oncology History  Malignant neoplasm of central portion of right breast in female, estrogen receptor positive (HCC)  09/12/2018 Initial Diagnosis   Ductal carcinoma in situ (DCIS) of right breast   09/17/2018 Cancer Staging   Staging form: Breast, AJCC 8th Edition - Clinical: Stage 0 (cTis (DCIS), cN0, cM0, ER+, PR+, HER2: Not Assessed) - Signed by Loa Socks, NP on 09/17/2018   10/02/2018 Cancer Staging   Staging form: Breast, AJCC 8th Edition - Pathologic stage from 10/02/2018: Stage IA (pT1b, pN0, cM0, G2, ER+, PR+, HER2-) - Signed by Loa Socks, NP on 10/15/2018      CURRENT THERAPY: Surveillance   INTERVAL HISTORY Mrs. [name] returns for follow up as scheduled. Last seen by me 03/20/22. [facility] mammogram 03/30/22 was benign. Screening MRI 09/11/22 detected a L breast non mass enhancement and incidental opacity in the R lung. Follow up CXR reports the finding is not visualized. She went back for MRI guided Bx 09/14/22 but the area appeared more like a vessel, and bx was not done. The plan is for close 3 month repeat MRI. She has had a stressful week, but otherwise doing well physically. Denies unintentional weight loss, abdominal pain/bloating, change in bowel habits, or any other new/specific complaints  ROS  All other systems reviewed and negative  Past Medical History:  Diagnosis Date    Anxiety    Anxiety disorder    BRCA2 positive 07/14/2012   Cancer (HCC) 2008   left breast   Dyslipidemia    Fall    Goiter    History of radiation therapy    Hypothyroidism    Osteoporosis      Past Surgical History:  Procedure Laterality Date   ABDOMINAL HYSTERECTOMY  2009   with BSO   BREAST LUMPECTOMY  [id number], 1991   BREAST LUMPECTOMY WITH RADIOACTIVE SEED LOCALIZATION Right 10/02/2018   Procedure: RIGHT BREAST LUMPECTOMY WITH RADIOACTIVE SEED LOCALIZATION;  Surgeon: Griselda Miner, MD;  Location: [facility];  Service: General;  Laterality: Right;   COLONOSCOPY N/A 12/13/2015   Procedure: COLONOSCOPY;  Surgeon: Charolett Bumpers, MD;  Location: [facility];  Service: Endoscopy;  Laterality: N/A;   FRACTURE SURGERY  2015   left hip   HARDWARE REMOVAL Left 07/08/2015   Procedure: LEFT HIP HARDWARE REMOVAL;  Surgeon: Ollen Gross, MD;  Location: [facility];  Service: Orthopedics;  Laterality: Left;     Outpatient Encounter Medications as of 09/18/2022  Medication Sig   atorvastatin (LIPITOR) 10 MG tablet Take 5 mg by mouth daily.    Biotin 1 MG CAPS Take by mouth.   BL EVENING PRIMROSE OIL PO Take by mouth.   CALCIUM PO Take by mouth.   clorazepate (TRANXENE) 7.5 MG tablet Take 15 mg by mouth at bedtime.    levothyroxine (SYNTHROID, LEVOTHROID) 125 MCG tablet Take 125 mcg by mouth daily.     traZODone (DESYREL) 100 MG tablet Take 100 mg by mouth at bedtime.   VITAMIN D PO Take by mouth. drops   [  DISCONTINUED] Na Sulfate-K Sulfate-Mg Sulf 17.5-3.13-1.6 GM/177ML SOLN Take 1 kit by mouth as directed. May use generic Suprep (Patient not taking: Reported on 03/20/2022)   No facility-administered encounter medications on file as of 09/18/2022.     Today's Vitals   09/18/22 1049  BP: (!) 146/75  Pulse: 64  Resp: 18  Temp: 98.2 F (36.8 C)  TempSrc: Oral  SpO2: 100%  Weight: 149 lb 6.4 oz (67.8 kg)  Height: 5\' 4"  (1.626 m)   Body mass index is 25.64 kg/m.    PHYSICAL EXAM GENERAL:alert, no distress and comfortable SKIN: no rash  EYES: sclera clear NECK: without mass LYMPH:  no palpable cervical or supraclavicular lymphadenopathy  LUNGS: clear with normal breathing effort HEART: regular rate & rhythm, no lower extremity edema ABDOMEN: abdomen soft, non-tender and normal bowel sounds NEURO: alert & oriented x 3 with fluent speech, no focal motor/sensory deficits Breast exam: No nipple discharge or inversion.  S/p bilateral lumpectomies, incisions completely healed with mild scar tissue.  No palpable mass or nodularity in either breast or axilla that I could appreciate    CBC    Component Value Date/Time   WBC 5.2 09/18/2022 1032   WBC 5.2 03/12/2019 1021   RBC 4.40 09/18/2022 1032   HGB 12.9 09/18/2022 1032   HGB 12.5 02/15/2016 1229   HCT 37.5 09/18/2022 1032   HCT 37.6 02/15/2016 1229   PLT 217 09/18/2022 1032   PLT 224 02/15/2016 1229   MCV 85.2 09/18/2022 1032   MCV 87.0 02/15/2016 1229   MCH 29.3 09/18/2022 1032   MCHC 34.4 09/18/2022 1032   RDW 12.4 09/18/2022 1032   RDW 12.2 02/15/2016 1229   LYMPHSABS 1.3 09/18/2022 1032   LYMPHSABS 1.9 02/15/2016 1229   MONOABS 0.4 09/18/2022 1032   MONOABS 0.6 02/15/2016 1229   EOSABS 0.3 09/18/2022 1032   EOSABS 0.2 02/15/2016 1229   BASOSABS 0.1 09/18/2022 1032   BASOSABS 0.0 02/15/2016 1229     CMP     Component Value Date/Time   NA 138 09/18/2022 1032   NA 138 02/15/2016 1229   K 4.3 09/18/2022 1032   K 4.5 02/15/2016 1229   CL 107 09/18/2022 1032   CL 106 07/16/2012 1201   CO2 26 09/18/2022 1032   CO2 26 02/15/2016 1229   GLUCOSE 112 (H) 09/18/2022 1032   GLUCOSE 92 02/15/2016 1229   GLUCOSE 98 07/16/2012 1201   BUN 14 09/18/2022 1032   BUN 13.7 02/15/2016 1229   CREATININE 0.90 09/18/2022 1032   CREATININE 0.9 02/15/2016 1229   CALCIUM 9.6 09/18/2022 1032   CALCIUM 10.4 02/15/2016 1229   PROT 6.5 09/18/2022 1032   PROT 7.3 02/15/2016 1229   ALBUMIN 4.0  09/18/2022 1032   ALBUMIN 3.9 02/15/2016 1229   AST 22 09/18/2022 1032   AST 22 02/15/2016 1229   ALT 23 09/18/2022 1032   ALT 25 02/15/2016 1229   ALKPHOS 60 09/18/2022 1032   ALKPHOS 54 02/15/2016 1229   BILITOT 0.4 09/18/2022 1032   BILITOT 0.39 02/15/2016 1229   GFRNONAA >60 09/18/2022 1032   GFRAA >60 03/12/2019 1021     ASSESSMENT & PLAN: [name] is a 67 yo post hysterectomy female with    1. Malignant neoplasm of central portion of right breast, stage 1, pT1b, cN0 IDC, grade 2 -Initially diagnosed with DCIS 09/10/18. Lumpectomy 10/02/18 showed invasive ductal carcinoma. S/p radiation 11/06/18 - 12/25/18.  -Took exemestane 01/2019 - 03/2021, discontinued due to osteoporosis and  small benefit. -Mammo 03/2022 was benign, breast MRI 09/11/22 detected a L breast non mass enhancement and incidental opacity in the R lung.  -Follow up CXR image is clear to me, report notes the MRI finding is not well  visualized. She has no respiratory symptoms, non smoker. We decided to hold on CT chest. She will let me know if she changes her mind after discussing with her family  -She went back for MRI guided Bx of L br on 09/14/22 but the area appeared more like a vessel, and bx was not done. The plan is for close 3 month repeat MRI. -Mrs. Strother is clinically doing well. Breast exam is benign, labs are normal. Overall no clinical concern for recurrence. Will continue surveillance  -F/up in 6 months, or sooner if needed -Case reviewed with Dr. Mosetta Putt    2.  H/o Left breast stage T2 N0 IDC, ER+/PR+/Her2-  -s/p lumpectomy 03/2007. Oncotype RS 20, low risk.  -Took antiestrogens from 07/2007 - 09/2018 (Arimidex, tamoxifen, and raloxifene).   3. BRCA2+ -found to be BRCA2+ at time of first breast cancer. Bilateral mastectomies not felt to be necessary at that time, but she did undergo TAH w/ BSO 10/2007. -She has adult 2 sons, one is positive, other son has not been tested. -A maternal first cousin had  pancreatic cancer. We have discussed screening MRI, but will hold for now, given the stress from recent breast MRI -Asymptomatic    4. Osteoporosis -most recent DEXA 03/23/21 showed T-score -2.9 at right hip. Though density in lumbar spine worsened some, her hip slightly improved from -3.1 in 03/2020. -she was previously on fosamax for many years then prolia injections for one year.  Currently on a treatment holiday -follows with Dr. Donnie Coffin at [facility].   5. Health maintenance  -I encouraged her to continue healthy active lifestyle and risk reducing behaviors.  -She will establish with new PCP in August    PLAN: -Recent breast MRI, chest Xray, and today's labs reviewed -Continue breast cancer surveillance -Repeat L breast MRI 12/2022, then bilateral mammogram at [facility] 03/2023; will call with results -Defer CT chest and pancreatic cancer screening for now, will let me know if she changes her mind based on family discussion -Follow up with me in 6 months (no lab), or sooner if needed  -Plan reviewed with Dr. Mosetta Putt    Orders Placed This Encounter  Procedures   MR BREAST LEFT W WO CONTRAST INC CAD    Standing Status:   Future    Standing Expiration Date:   09/18/2023    Order Specific Question:   If indicated for the ordered procedure, I authorize the administration of contrast media per Radiology protocol    Answer:   Yes    Order Specific Question:   What is the patient's sedation requirement?    Answer:   No Sedation    Order Specific Question:   Does the patient have a pacemaker or implanted devices?    Answer:   No    Order Specific Question:   Preferred imaging location?    Answer:   [facility] (table limit-550lbs)   MM 3D SCREENING MAMMOGRAM BILATERAL BREAST    Standing Status:   Future    Standing Expiration Date:   09/18/2023    Order Specific Question:   Reason for Exam (SYMPTOM  OR DIAGNOSIS REQUIRED)    Answer:   h/o bilateral breast cancer L 2008, R 2020 s/p lumpectomy  and radiation  Order Specific Question:   Preferred imaging location?    Answer:   [facility]      All questions were answered. The patient knows to call the clinic with any problems, questions or concerns. No barriers to learning were detected. I spent 20 minutes counseling the patient face to face. The total time spent in the appointment was 30 minutes and more than 50% was on counseling, review of test results, and coordination of care.   Santiago Glad, NP-C 09/18/2022

## 2022-09-17 ENCOUNTER — Other Ambulatory Visit: Payer: Self-pay

## 2022-09-17 DIAGNOSIS — C50912 Malignant neoplasm of unspecified site of left female breast: Secondary | ICD-10-CM

## 2022-09-17 DIAGNOSIS — C50111 Malignant neoplasm of central portion of right female breast: Secondary | ICD-10-CM

## 2022-09-17 DIAGNOSIS — Z170 Estrogen receptor positive status [ER+]: Secondary | ICD-10-CM

## 2022-09-18 ENCOUNTER — Other Ambulatory Visit: Payer: Self-pay

## 2022-09-18 ENCOUNTER — Other Ambulatory Visit: Payer: Self-pay | Admitting: Nurse Practitioner

## 2022-09-18 ENCOUNTER — Encounter: Payer: Self-pay | Admitting: Nurse Practitioner

## 2022-09-18 ENCOUNTER — Inpatient Hospital Stay: Payer: Medicare Other | Attending: Nurse Practitioner | Admitting: Nurse Practitioner

## 2022-09-18 ENCOUNTER — Inpatient Hospital Stay: Payer: Medicare Other

## 2022-09-18 VITALS — BP 146/75 | HR 64 | Temp 98.2°F | Resp 18 | Ht 64.0 in | Wt 149.4 lb

## 2022-09-18 DIAGNOSIS — C50111 Malignant neoplasm of central portion of right female breast: Secondary | ICD-10-CM

## 2022-09-18 DIAGNOSIS — Z170 Estrogen receptor positive status [ER+]: Secondary | ICD-10-CM

## 2022-09-18 DIAGNOSIS — E785 Hyperlipidemia, unspecified: Secondary | ICD-10-CM | POA: Diagnosis not present

## 2022-09-18 DIAGNOSIS — Z79899 Other long term (current) drug therapy: Secondary | ICD-10-CM | POA: Diagnosis not present

## 2022-09-18 DIAGNOSIS — R928 Other abnormal and inconclusive findings on diagnostic imaging of breast: Secondary | ICD-10-CM

## 2022-09-18 DIAGNOSIS — M810 Age-related osteoporosis without current pathological fracture: Secondary | ICD-10-CM | POA: Insufficient documentation

## 2022-09-18 DIAGNOSIS — Z1501 Genetic susceptibility to malignant neoplasm of breast: Secondary | ICD-10-CM | POA: Diagnosis not present

## 2022-09-18 DIAGNOSIS — Z79890 Hormone replacement therapy (postmenopausal): Secondary | ICD-10-CM | POA: Diagnosis not present

## 2022-09-18 DIAGNOSIS — Z923 Personal history of irradiation: Secondary | ICD-10-CM | POA: Diagnosis not present

## 2022-09-18 DIAGNOSIS — C50912 Malignant neoplasm of unspecified site of left female breast: Secondary | ICD-10-CM | POA: Diagnosis not present

## 2022-09-18 DIAGNOSIS — E039 Hypothyroidism, unspecified: Secondary | ICD-10-CM | POA: Diagnosis not present

## 2022-09-18 DIAGNOSIS — Z1505 Genetic susceptibility to other malignant neoplasm of digestive system: Secondary | ICD-10-CM

## 2022-09-18 DIAGNOSIS — C50412 Malignant neoplasm of upper-outer quadrant of left female breast: Secondary | ICD-10-CM | POA: Diagnosis not present

## 2022-09-18 DIAGNOSIS — Z1231 Encounter for screening mammogram for malignant neoplasm of breast: Secondary | ICD-10-CM

## 2022-09-18 LAB — CBC WITH DIFFERENTIAL (CANCER CENTER ONLY)
Abs Immature Granulocytes: 0.03 10*3/uL (ref 0.00–0.07)
Basophils Absolute: 0.1 10*3/uL (ref 0.0–0.1)
Basophils Relative: 1 %
Eosinophils Absolute: 0.3 10*3/uL (ref 0.0–0.5)
Eosinophils Relative: 5 %
HCT: 37.5 % (ref 36.0–46.0)
Hemoglobin: 12.9 g/dL (ref 12.0–15.0)
Immature Granulocytes: 1 %
Lymphocytes Relative: 25 %
Lymphs Abs: 1.3 10*3/uL (ref 0.7–4.0)
MCH: 29.3 pg (ref 26.0–34.0)
MCHC: 34.4 g/dL (ref 30.0–36.0)
MCV: 85.2 fL (ref 80.0–100.0)
Monocytes Absolute: 0.4 10*3/uL (ref 0.1–1.0)
Monocytes Relative: 8 %
Neutro Abs: 3.2 10*3/uL (ref 1.7–7.7)
Neutrophils Relative %: 60 %
Platelet Count: 217 10*3/uL (ref 150–400)
RBC: 4.4 MIL/uL (ref 3.87–5.11)
RDW: 12.4 % (ref 11.5–15.5)
WBC Count: 5.2 10*3/uL (ref 4.0–10.5)
nRBC: 0 % (ref 0.0–0.2)

## 2022-09-18 LAB — CMP (CANCER CENTER ONLY)
ALT: 23 U/L (ref 0–44)
AST: 22 U/L (ref 15–41)
Albumin: 4 g/dL (ref 3.5–5.0)
Alkaline Phosphatase: 60 U/L (ref 38–126)
Anion gap: 5 (ref 5–15)
BUN: 14 mg/dL (ref 8–23)
CO2: 26 mmol/L (ref 22–32)
Calcium: 9.6 mg/dL (ref 8.9–10.3)
Chloride: 107 mmol/L (ref 98–111)
Creatinine: 0.9 mg/dL (ref 0.44–1.00)
GFR, Estimated: >60 mL/min (ref >60)
Glucose, Bld: 112 mg/dL — ABNORMAL HIGH (ref 70–99)
Potassium: 4.3 mmol/L (ref 3.5–5.1)
Sodium: 138 mmol/L (ref 135–145)
Total Bilirubin: 0.4 mg/dL (ref 0.3–1.2)
Total Protein: 6.5 g/dL (ref 6.5–8.1)

## 2022-11-18 DIAGNOSIS — E039 Hypothyroidism, unspecified: Secondary | ICD-10-CM | POA: Insufficient documentation

## 2022-11-18 DIAGNOSIS — M810 Age-related osteoporosis without current pathological fracture: Secondary | ICD-10-CM | POA: Insufficient documentation

## 2022-11-18 DIAGNOSIS — E785 Hyperlipidemia, unspecified: Secondary | ICD-10-CM | POA: Insufficient documentation

## 2022-11-18 NOTE — Patient Instructions (Incomplete)
    It was nice to meet you.    Blood work was ordered.   The lab is on the first floor.    Medications changes include :       A referral was ordered and someone will call you to schedule an appointment.     No follow-ups on file.

## 2022-11-18 NOTE — Progress Notes (Unsigned)
Subjective:    Patient ID: [name], female    DOB: 28-Oct-1955, 66 y.o.   MRN: [id number]     HPI Ita is here for follow up of her chronic medical problems.  She is here to establish with a new pcp.      Here to establish with a new PCP.  Her last PCP retired.  She has no major concerns  She walks regularly for exercise  Medications and allergies reviewed with patient and updated if appropriate.  Current Outpatient Medications on File Prior to Visit  Medication Sig Dispense Refill   atorvastatin (LIPITOR) 10 MG tablet Take 5 mg by mouth daily.      Biotin 1 MG CAPS Take by mouth.     BL EVENING PRIMROSE OIL PO Take by mouth.     CALCIUM PO Take by mouth.     clorazepate (TRANXENE) 7.5 MG tablet Take 15 mg by mouth at bedtime.      levothyroxine (SYNTHROID, LEVOTHROID) 125 MCG tablet Take 125 mcg by mouth daily.       traZODone (DESYREL) 100 MG tablet Take 100 mg by mouth at bedtime.     VITAMIN D PO Take by mouth. drops     Omega-3 1000 MG CAPS Take by mouth.     No current facility-administered medications on file prior to visit.     Review of Systems  Constitutional:  Negative for fever.  Respiratory:  Negative for cough, shortness of breath and wheezing.   Cardiovascular:  Negative for chest pain, palpitations and leg swelling.  Gastrointestinal:  Negative for abdominal pain, blood in stool, constipation and diarrhea.  Musculoskeletal:  Negative for arthralgias and back pain.  Neurological:  Negative for light-headedness and headaches.  Psychiatric/Behavioral:  Positive for sleep disturbance (Controlled). Negative for dysphoric mood. The patient is nervous/anxious (Controlled).        Objective:   Vitals:   11/19/22 1111  BP: 102/68  Pulse: 80  Temp: 98.2 F (36.8 C)  SpO2: 95%   BP Readings from Last 3 Encounters:  11/19/22 102/68  09/18/22 (!) 146/75  03/20/22 138/63   Wt Readings from Last 3 Encounters:  11/19/22 147 lb 12.8 oz (67  kg)  09/18/22 149 lb 6.4 oz (67.8 kg)  03/20/22 151 lb 9.6 oz (68.8 kg)   Body mass index is 25.37 kg/m.    Physical Exam Constitutional:      General: She is not in acute distress.    Appearance: Normal appearance.  HENT:     Head: Normocephalic and atraumatic.  Eyes:     Conjunctiva/sclera: Conjunctivae normal.  Cardiovascular:     Rate and Rhythm: Normal rate and regular rhythm.     Heart sounds: Normal heart sounds.  Pulmonary:     Effort: Pulmonary effort is normal. No respiratory distress.     Breath sounds: Normal breath sounds. No wheezing.  Abdominal:     General: There is no distension.     Palpations: Abdomen is soft.     Tenderness: There is no abdominal tenderness. There is no guarding or rebound.  Musculoskeletal:     Cervical back: Neck supple.     Right lower leg: No edema.     Left lower leg: No edema.  Lymphadenopathy:     Cervical: No cervical adenopathy.  Skin:    General: Skin is warm and dry.     Findings: No rash.  Neurological:     Mental Status: She  is alert. Mental status is at baseline.  Psychiatric:        Mood and Affect: Mood normal.        Behavior: Behavior normal.        Lab Results  Component Value Date   WBC 5.2 09/18/2022   HGB 12.9 09/18/2022   HCT 37.5 09/18/2022   PLT 217 09/18/2022   GLUCOSE 112 (H) 09/18/2022   ALT 23 09/18/2022   AST 22 09/18/2022   NA 138 09/18/2022   K 4.3 09/18/2022   CL 107 09/18/2022   CREATININE 0.90 09/18/2022   BUN 14 09/18/2022   CO2 26 09/18/2022   TSH 2.470 10/14/2019     Assessment & Plan:    See Problem List for Assessment and Plan of chronic medical problems.

## 2022-11-19 ENCOUNTER — Ambulatory Visit (INDEPENDENT_AMBULATORY_CARE_PROVIDER_SITE_OTHER): Payer: Medicare Other | Admitting: Internal Medicine

## 2022-11-19 ENCOUNTER — Encounter: Payer: Self-pay | Admitting: Internal Medicine

## 2022-11-19 VITALS — BP 102/68 | HR 80 | Temp 98.2°F | Ht 64.0 in | Wt 147.8 lb

## 2022-11-19 DIAGNOSIS — M810 Age-related osteoporosis without current pathological fracture: Secondary | ICD-10-CM

## 2022-11-19 DIAGNOSIS — E039 Hypothyroidism, unspecified: Secondary | ICD-10-CM | POA: Diagnosis not present

## 2022-11-19 DIAGNOSIS — F419 Anxiety disorder, unspecified: Secondary | ICD-10-CM | POA: Insufficient documentation

## 2022-11-19 DIAGNOSIS — R739 Hyperglycemia, unspecified: Secondary | ICD-10-CM

## 2022-11-19 DIAGNOSIS — E7849 Other hyperlipidemia: Secondary | ICD-10-CM | POA: Diagnosis not present

## 2022-11-19 LAB — CBC WITH DIFFERENTIAL/PLATELET
Basophils Absolute: 0 10*3/uL (ref 0.0–0.1)
Basophils Relative: 0.7 % (ref 0.0–3.0)
Eosinophils Absolute: 0.2 10*3/uL (ref 0.0–0.7)
Eosinophils Relative: 4 % (ref 0.0–5.0)
HCT: 41.6 % (ref 36.0–46.0)
Hemoglobin: 13.6 g/dL (ref 12.0–15.0)
Lymphocytes Relative: 26.1 % (ref 12.0–46.0)
Lymphs Abs: 1.6 10*3/uL (ref 0.7–4.0)
MCHC: 32.7 g/dL (ref 30.0–36.0)
MCV: 86.2 fl (ref 78.0–100.0)
Monocytes Absolute: 0.5 10*3/uL (ref 0.1–1.0)
Monocytes Relative: 7.7 % (ref 3.0–12.0)
Neutro Abs: 3.7 10*3/uL (ref 1.4–7.7)
Neutrophils Relative %: 61.5 % (ref 43.0–77.0)
Platelets: 246 10*3/uL (ref 150.0–400.0)
RBC: 4.82 Mil/uL (ref 3.87–5.11)
RDW: 13.2 % (ref 11.5–15.5)
WBC: 6 10*3/uL (ref 4.0–10.5)

## 2022-11-19 LAB — HEMOGLOBIN A1C: Hgb A1c MFr Bld: 5.8 % (ref 4.6–6.5)

## 2022-11-19 LAB — TSH: TSH: 0.22 u[IU]/mL — ABNORMAL LOW (ref 0.35–5.50)

## 2022-11-19 LAB — VITAMIN D 25 HYDROXY (VIT D DEFICIENCY, FRACTURES): VITD: 53.79 ng/mL (ref 30.00–100.00)

## 2022-11-19 NOTE — Assessment & Plan Note (Addendum)
Chronic Following at [facility] endocrinology Previous treatments-Fosamax for many years, Evenity, Reclast x 1 Currently on bone holiday DEXA up to date-next due February 2025  Exercising regularly Taking oral liquid vitamin D, calcium Exercises regularly

## 2022-11-19 NOTE — Assessment & Plan Note (Addendum)
Chronic History of multinodular goiter Clinically euthyroid Stable on medication Currently taking levothyroxine 125 mcg daily Check TSH, will titrate dose if necessary

## 2022-11-19 NOTE — Assessment & Plan Note (Signed)
Chronic Well-controlled Management per Dr. Donell Beers On clorazepate 15 mg at bedtime, trazodone 100 mg at bedtime

## 2022-11-19 NOTE — Assessment & Plan Note (Signed)
Chronic Continue regular exercise and healthy diet  Check lipid panel, CMP Continue atorvastatin 5 mg daily

## 2022-11-20 LAB — COMPREHENSIVE METABOLIC PANEL
ALT: 15 U/L (ref 0–35)
AST: 16 U/L (ref 0–37)
Albumin: 4.6 g/dL (ref 3.5–5.2)
Alkaline Phosphatase: 64 U/L (ref 39–117)
BUN: 14 mg/dL (ref 6–23)
CO2: 27 meq/L (ref 19–32)
Calcium: 10.1 mg/dL (ref 8.4–10.5)
Chloride: 103 meq/L (ref 96–112)
Creatinine, Ser: 0.93 mg/dL (ref 0.40–1.20)
GFR: 63.23 mL/min (ref >60.00)
Glucose, Bld: 106 mg/dL — ABNORMAL HIGH (ref 70–99)
Potassium: 4.3 meq/L (ref 3.5–5.1)
Sodium: 140 mEq/L (ref 135–145)
Total Bilirubin: 0.7 mg/dL (ref 0.2–1.2)
Total Protein: 7.6 g/dL (ref 6.0–8.3)

## 2022-11-20 LAB — LIPID PANEL
Cholesterol: 241 mg/dL — ABNORMAL HIGH (ref 0–200)
HDL: 62.3 mg/dL (ref >39.00)
LDL Cholesterol: 148 mg/dL — ABNORMAL HIGH (ref 0–99)
NonHDL: 178.5
Total CHOL/HDL Ratio: 4
Triglycerides: 154 mg/dL — ABNORMAL HIGH (ref 0.0–149.0)
VLDL: 30.8 mg/dL (ref 0.0–40.0)

## 2022-12-14 ENCOUNTER — Ambulatory Visit
Admission: RE | Admit: 2022-12-14 | Discharge: 2022-12-14 | Disposition: A | Discharge status: Home / Self Care | Payer: Medicare Other | Source: Ambulatory Visit | Attending: Nurse Practitioner | Admitting: Nurse Practitioner

## 2022-12-14 DIAGNOSIS — R928 Other abnormal and inconclusive findings on diagnostic imaging of breast: Secondary | ICD-10-CM

## 2022-12-14 DIAGNOSIS — C50912 Malignant neoplasm of unspecified site of left female breast: Secondary | ICD-10-CM

## 2022-12-14 DIAGNOSIS — Z170 Estrogen receptor positive status [ER+]: Secondary | ICD-10-CM

## 2022-12-14 MED ORDER — GADOPICLENOL 0.5 MMOL/ML IV SOLN
7.0000 mL | Freq: Once | INTRAVENOUS | Status: AC | PRN
  Administered 2022-12-14: 7 mL via INTRAVENOUS

## 2022-12-18 ENCOUNTER — Other Ambulatory Visit: Payer: Self-pay | Admitting: Nurse Practitioner

## 2022-12-18 DIAGNOSIS — R9389 Abnormal findings on diagnostic imaging of other specified body structures: Secondary | ICD-10-CM

## 2022-12-20 ENCOUNTER — Other Ambulatory Visit: Payer: Self-pay | Admitting: Body Imaging

## 2022-12-20 ENCOUNTER — Ambulatory Visit
Admission: RE | Admit: 2022-12-20 | Discharge: 2022-12-20 | Disposition: A | Discharge status: Home / Self Care | Payer: Medicare Other | Source: Ambulatory Visit | Attending: Nurse Practitioner | Admitting: Nurse Practitioner

## 2022-12-20 DIAGNOSIS — R9389 Abnormal findings on diagnostic imaging of other specified body structures: Secondary | ICD-10-CM

## 2022-12-20 MED ORDER — GADOPICLENOL 0.5 MMOL/ML IV SOLN
7.5000 mL | Freq: Once | INTRAVENOUS | Status: AC | PRN
  Administered 2022-12-20: 7.5 mL via INTRAVENOUS

## 2022-12-21 LAB — SURGICAL PATHOLOGY

## 2022-12-28 ENCOUNTER — Encounter: Payer: Self-pay | Admitting: Internal Medicine

## 2022-12-28 DIAGNOSIS — E7849 Other hyperlipidemia: Secondary | ICD-10-CM

## 2022-12-28 DIAGNOSIS — E039 Hypothyroidism, unspecified: Secondary | ICD-10-CM

## 2023-03-18 ENCOUNTER — Ambulatory Visit (INDEPENDENT_AMBULATORY_CARE_PROVIDER_SITE_OTHER): Payer: Medicare Other

## 2023-03-18 VITALS — Ht 64.0 in | Wt 147.0 lb

## 2023-03-18 DIAGNOSIS — Z0000 Encounter for general adult medical examination without abnormal findings: Secondary | ICD-10-CM

## 2023-03-18 NOTE — Progress Notes (Signed)
Subjective:   [name] is a 67 y.o. female who presents for Medicare Annual (Subsequent) preventive examination.  Visit Complete: Virtual I connected with  [name] on 03/18/23 by a audio enabled telemedicine application and verified that I am speaking with the correct person using two identifiers.  Patient Location: Home  Provider Location: Home Office  I discussed the limitations of evaluation and management by telemedicine. The patient expressed understanding and agreed to proceed.  Vital Signs: Because this visit was a virtual/telehealth visit, some criteria may be missing or patient reported. Any vitals not documented were not able to be obtained and vitals that have been documented are patient reported.    Cardiac Risk Factors include: advanced age (>58men, >70 women)     Objective:    Today's Vitals   03/18/23 0912  Weight: 147 lb (66.7 kg)  Height: 5\' 4"  (1.626 m)   Body mass index is 25.23 kg/m.     03/18/2023    9:17 AM 02/09/2019   11:24 AM 10/02/2018   12:11 PM 09/24/2018    9:00 AM 09/23/2018    4:17 PM 11/03/2016    1:00 AM 11/02/2016    8:37 PM  Advanced Directives  Does Patient Have a Medical Advance Directive? Yes Yes Yes Yes Yes Yes No  Type of Estate agent of [place];Living will Healthcare Power of [place];Living will Healthcare Power of [place];Living will Healthcare Power of [place];Living will Healthcare Power of [place];Living will Healthcare Power of [place];Living will   Does patient want to make changes to medical advance directive?   No - Patient declined   No - Patient declined   Copy of Healthcare Power of Attorney in Chart? No - copy requested No - copy requested No - copy requested No - copy requested No - copy requested No - copy requested   Would patient like information on creating a medical advance directive?       No - Patient declined    Current Medications (verified) Outpatient Encounter  Medications as of 03/18/2023  Medication Sig   atorvastatin (LIPITOR) 10 MG tablet Take 5 mg by mouth daily.    Biotin 1 MG CAPS Take by mouth.   BL EVENING PRIMROSE OIL PO Take by mouth.   CALCIUM PO Take by mouth.   clorazepate (TRANXENE) 7.5 MG tablet Take 15 mg by mouth at bedtime.    levothyroxine (SYNTHROID, LEVOTHROID) 125 MCG tablet Take 125 mcg by mouth daily. Take 5 days a  week.   Omega-3 1000 MG CAPS Take by mouth.   traZODone (DESYREL) 100 MG tablet Take 100 mg by mouth at bedtime.   VITAMIN D PO Take by mouth. drops   No facility-administered encounter medications on file as of 03/18/2023.    Allergies (verified) Adhesive [tape], Chloraprep one step [chlorhexidine gluconate], Latex, Betadine [povidone iodine], Cephalexin, Ampicillin, and Macrodantin [nitrofurantoin]   History: Past Medical History:  Diagnosis Date   Anxiety    Anxiety disorder    BRCA2 positive 07/14/2012   Cancer (HCC) 2008   left breast   Dyslipidemia    Fall    Goiter    History of radiation therapy    Hypothyroidism    Osteoporosis    Past Surgical History:  Procedure Laterality Date   ABDOMINAL HYSTERECTOMY  2009   with BSO   BREAST LUMPECTOMY  1610,9604, 1991   BREAST LUMPECTOMY WITH RADIOACTIVE SEED LOCALIZATION Right 10/02/2018   Procedure: RIGHT BREAST LUMPECTOMY WITH RADIOACTIVE SEED LOCALIZATION;  Surgeon: Griselda Miner, MD;  Location: [facility];  Service: General;  Laterality: Right;   COLONOSCOPY N/A 12/13/2015   Procedure: COLONOSCOPY;  Surgeon: Charolett Bumpers, MD;  Location: [facility];  Service: Endoscopy;  Laterality: N/A;   FRACTURE SURGERY  2015   left hip   HARDWARE REMOVAL Left 07/08/2015   Procedure: LEFT HIP HARDWARE REMOVAL;  Surgeon: Ollen Gross, MD;  Location: [facility];  Service: Orthopedics;  Laterality: Left;   Family History  Problem Relation Age of Onset   Breast cancer Mother    Colon cancer Father 61   Cancer Maternal Grandmother         unknown primary   Esophageal cancer Neg Hx    Stomach cancer Neg Hx    Social History   Socioeconomic History   Marital status: Married    Spouse name: [name]   Number of children: 2   Years of education: Not on file   Highest education level: Not on file  Occupational History   Occupation: retired  Tobacco Use   Smoking status: Former    Types: Cigarettes   Smokeless tobacco: Never  Vaping Use   Vaping status: Never Used  Substance and Sexual Activity   Alcohol use: Yes    Alcohol/week: 5.0 standard drinks of alcohol    Types: 5 Glasses of wine per week   Drug use: No   Sexual activity: Not on file  Other Topics Concern   Not on file  Social History Narrative   Lives with husband 1 dog   Social Drivers of Health   Financial Resource Strain: Low Risk  (03/18/2023)   Overall Financial Resource Strain (CARDIA)    Difficulty of Paying Living Expenses: Not very hard  Food Insecurity: No Food Insecurity (03/18/2023)   Hunger Vital Sign    Worried About Running Out of Food in the Last Year: Never true    Ran Out of Food in the Last Year: Never true  Transportation Needs: No Transportation Needs (03/18/2023)   PRAPARE - Administrator, Civil Service (Medical): No    Lack of Transportation (Non-Medical): No  Physical Activity: Sufficiently Active (03/18/2023)   Exercise Vital Sign    Days of Exercise per Week: 4 days    Minutes of Exercise per Session: 60 min  Stress: No Stress Concern Present (03/18/2023)   [employer] of Occupational Health - Occupational Stress Questionnaire    Feeling of Stress : Only a little  Social Connections: Socially Integrated (03/18/2023)   Social Connection and Isolation Panel [NHANES]    Frequency of Communication with Friends and Family: More than three times a week    Frequency of Social Gatherings with Friends and Family: Once a week    Attends Religious Services: More than 4 times per year    Active Member of [employer]  or Organizations: Yes    Attends Engineer, structural: More than 4 times per year    Marital Status: Married    Tobacco Counseling Counseling given: Not Answered   Clinical Intake:  Pre-visit preparation completed: Yes  Pain : No/denies pain     BMI - recorded: 25.23 Nutritional Status: BMI 25 -29 Overweight Nutritional Risks: None  How often do you need to have someone help you when you read instructions, pamphlets, or other written materials from your doctor or pharmacy?: 1 - Never  Interpreter Needed?: No      Activities of Daily Living    03/18/2023  9:14 AM  In your present state of health, do you have any difficulty performing the following activities:  Hearing? 0  Vision? 0  Difficulty concentrating or making decisions? 0  Walking or climbing stairs? 0  Dressing or bathing? 0  Doing errands, shopping? 0  Preparing Food and eating ? N  Using the Toilet? N  In the past six months, have you accidently leaked urine? N  Do you have problems with loss of bowel control? N  Managing your Medications? N  Managing your Finances? N  Housekeeping or managing your Housekeeping? N    Patient Care Team: Pincus Sanes, MD as PCP - General (Internal Medicine) Archer Asa, MD as Consulting Physician (Psychiatry) Ollen Gross, MD as Consulting Physician (Orthopedic Surgery) Shea Evans, MD as Consulting Physician (Obstetrics and Gynecology) Antony Blackbird, MD as Consulting Physician (Radiation Oncology) Griselda Miner, MD as Consulting Physician (General Surgery) Malachy Mood, MD as Consulting Physician (Hematology) [facility] as Consulting Physician (Radiology) Gelene Mink, OD as Referring Physician (Optometry)  Indicate any recent Medical Services you may have received from other than [facility] providers in the past year (date may be approximate).     Assessment:   This is a routine wellness examination for  [place].  Hearing/Vision screen Hearing Screening - Comments:: Denies hearing difficulties   Vision Screening - Comments:: Wears eyeglasses or contacts   Goals Addressed               This Visit's Progress     Patient Stated (pt-stated)        Lose a little weight      Depression Screen    03/18/2023    9:21 AM  PHQ 2/9 Scores  PHQ - 2 Score 0    Fall Risk    03/18/2023    9:18 AM  Fall Risk   Falls in the past year? 0  Number falls in past yr: 0  Injury with Fall? 0  Risk for fall due to : No Fall Risks  Follow up Falls prevention discussed;Falls evaluation completed    MEDICARE RISK AT HOME: Medicare Risk at Home Any stairs in or around the home?: Yes If so, are there any without handrails?: Yes Home free of loose throw rugs in walkways, pet beds, electrical cords, etc?: Yes Adequate lighting in your home to reduce risk of falls?: Yes Life alert?: No Use of a cane, walker or w/c?: No Grab bars in the bathroom?: No Shower chair or bench in shower?: Yes Elevated toilet seat or a handicapped toilet?: Yes  TIMED UP AND GO:  Was the test performed?  No    Cognitive Function:        03/18/2023    9:30 AM  6CIT Screen  What Year? 0 points  What month? 0 points  What time? 0 points  Count back from 20 0 points  Months in reverse 0 points  Repeat phrase 0 points  Total Score 0 points    Immunizations Immunization History  Administered Date(s) Administered   Influenza, High Dose Seasonal PF 02/10/2020   Pneumococcal-Unspecified 12/01/2020   Tdap 09/24/2018   Unspecified SARS-COV-2 Vaccination 06/10/2019, 07/08/2019, 01/09/2020, 12/13/2020    TDAP status: Up to date  Flu Vaccine status: Up to date  Pneumococcal vaccine status: Due, Education has been provided regarding the importance of this vaccine. Advised may receive this vaccine at local pharmacy or Health Dept. Aware to provide a copy of the vaccination record if obtained  from local  pharmacy or Health Dept. Verbalized acceptance and understanding.  Covid-19 vaccine status: Declined, Education has been provided regarding the importance of this vaccine but patient still declined. Advised may receive this vaccine at local pharmacy or Health Dept.or vaccine clinic. Aware to provide a copy of the vaccination record if obtained from local pharmacy or Health Dept. Verbalized acceptance and understanding.  Qualifies for Shingles Vaccine? Yes   Zostavax completed Yes   Shingrix Completed?: No.    Education has been provided regarding the importance of this vaccine. Patient has been advised to call insurance company to determine out of pocket expense if they have not yet received this vaccine. Advised may also receive vaccine at local pharmacy or Health Dept. Verbalized acceptance and understanding.  Screening Tests Health Maintenance  Topic Date Due   Hepatitis C Screening  Never done   Zoster Vaccines- Shingrix (1 of 2) Never done   Pneumonia Vaccine 35+ Years old (1 of 1 - PCV) 07/18/2019   INFLUENZA VACCINE  11/01/2022   COVID-19 Vaccine (5 - 2024-25 season) 12/02/2022   Medicare Annual Wellness (AWV)  03/17/2024   MAMMOGRAM  12/13/2024   Colonoscopy  05/05/2026   DTaP/Tdap/Td (2 - Td or Tdap) 09/23/2028   DEXA SCAN  Completed   HPV VACCINES  Aged Out    Health Maintenance  Health Maintenance Due  Topic Date Due   Hepatitis C Screening  Never done   Zoster Vaccines- Shingrix (1 of 2) Never done   Pneumonia Vaccine 49+ Years old (1 of 1 - PCV) 07/18/2019   INFLUENZA VACCINE  11/01/2022   COVID-19 Vaccine (5 - 2024-25 season) 12/02/2022    Colorectal cancer screening: Type of screening: Colonoscopy. Completed 05/05/2021. Repeat every 5 years  Mammogram status: Completed 12/14/2022. Repeat every year  Bone Density status: Completed 03/23/2021. Results reflect: Bone density results: OSTEOPOROSIS. Repeat every 2 years.  Lung Cancer Screening: (Low Dose CT Chest  recommended if Age 101-80 years, 20 pack-year currently smoking OR have quit w/in 15years.) does not qualify.   Lung Cancer Screening Referral: N/A  Additional Screening:  Hepatitis C Screening: does qualify; C  Vision Screening: Recommended annual ophthalmology exams for early detection of glaucoma and other disorders of the eye. Is the patient up to date with their annual eye exam?  Yes  Who is the provider or what is the name of the office in which the patient attends annual eye exams? Dr. Sharlot Gowda If pt is not established with a provider, would they like to be referred to a provider to establish care? No .   Dental Screening: Recommended annual dental exams for proper oral hygiene   Community Resource Referral / Chronic Care Management: CRR required this visit?  No   CCM required this visit?  No     Plan:     I have personally reviewed and noted the following in the patient's chart:   Medical and social history Use of alcohol, tobacco or illicit drugs  Current medications and supplements including opioid prescriptions. Patient is not currently taking opioid prescriptions. Functional ability and status Nutritional status Physical activity Advanced directives List of other physicians Hospitalizations, surgeries, and ER visits in previous 12 months Vitals Screenings to include cognitive, depression, and falls Referrals and appointments  In addition, I have reviewed and discussed with patient certain preventive protocols, quality metrics, and best practice recommendations. A written personalized care plan for preventive services as well as general preventive health recommendations were provided to patient.  Konstantinos Cordoba L Shanel Prazak, CMA   03/18/2023   After Visit Summary: (MyChart) Due to this being a telephonic visit, the after visit summary with patients personalized plan was offered to patient via MyChart   Nurse Notes: Patient is due for a #2 Shingrix vaccine and a 2nd  Pneumonia vaccine.  Patient is also due for a Hep C screening.  She had no other concerns to address today.

## 2023-03-18 NOTE — Patient Instructions (Signed)
Ms. Hottel , Thank you for taking time to come for your Medicare Wellness Visit. I appreciate your ongoing commitment to your health goals. Please review the following plan we discussed and let me know if I can assist you in the future.   Referrals/Orders/Follow-Ups/Clinician Recommendations: You are due for a 2nd Shingrix vaccine and also a Pneumonia vaccine.    This is a list of the screening recommended for you and due dates:  Health Maintenance  Topic Date Due   Hepatitis C Screening  Never done   Zoster (Shingles) Vaccine (1 of 2) Never done   Pneumonia Vaccine (1 of 1 - PCV) 07/18/2019   COVID-19 Vaccine (5 - 2024-25 season) 04/03/2023*   Medicare Annual Wellness Visit  03/17/2024   Mammogram  12/13/2024   Colon Cancer Screening  05/05/2026   DTaP/Tdap/Td vaccine (2 - Td or Tdap) 09/23/2028   Flu Shot  Completed   DEXA scan (bone density measurement)  Completed   HPV Vaccine  Aged Out  *Topic was postponed. The date shown is not the original due date.    Advanced directives: (Copy Requested) Please bring a copy of your health care power of attorney and living will to the office to be added to your chart at your convenience.  Next Medicare Annual Wellness Visit scheduled for next year: Yes

## 2023-03-21 ENCOUNTER — Encounter: Payer: Self-pay | Admitting: Nurse Practitioner

## 2023-03-21 ENCOUNTER — Inpatient Hospital Stay: Payer: Medicare Other | Attending: Nurse Practitioner | Admitting: Nurse Practitioner

## 2023-03-21 VITALS — BP 137/71 | HR 58 | Temp 97.3°F | Resp 15 | Wt 147.1 lb

## 2023-03-21 DIAGNOSIS — Z853 Personal history of malignant neoplasm of breast: Secondary | ICD-10-CM | POA: Diagnosis present

## 2023-03-21 DIAGNOSIS — C50912 Malignant neoplasm of unspecified site of left female breast: Secondary | ICD-10-CM

## 2023-03-21 DIAGNOSIS — R928 Other abnormal and inconclusive findings on diagnostic imaging of breast: Secondary | ICD-10-CM | POA: Diagnosis not present

## 2023-03-21 DIAGNOSIS — M810 Age-related osteoporosis without current pathological fracture: Secondary | ICD-10-CM | POA: Diagnosis not present

## 2023-03-21 DIAGNOSIS — C50412 Malignant neoplasm of upper-outer quadrant of left female breast: Secondary | ICD-10-CM | POA: Diagnosis not present

## 2023-03-21 DIAGNOSIS — Z1501 Genetic susceptibility to malignant neoplasm of breast: Secondary | ICD-10-CM | POA: Insufficient documentation

## 2023-03-21 DIAGNOSIS — Z170 Estrogen receptor positive status [ER+]: Secondary | ICD-10-CM

## 2023-03-21 DIAGNOSIS — C50111 Malignant neoplasm of central portion of right female breast: Secondary | ICD-10-CM

## 2023-03-21 NOTE — Progress Notes (Signed)
Patient Care Team: Pincus Sanes, MD as PCP - General (Internal Medicine) Archer Asa, MD as Consulting Physician (Psychiatry) Ollen Gross, MD as Consulting Physician (Orthopedic Surgery) Shea Evans, MD as Consulting Physician (Obstetrics and Gynecology) Antony Blackbird, MD as Consulting Physician (Radiation Oncology) Griselda Miner, MD as Consulting Physician (General Surgery) Malachy Mood, MD as Consulting Physician (Hematology) [facility] as Consulting Physician (Radiology) Gelene Mink, OD as Referring Physician (Optometry)   CHIEF COMPLAINT: Follow-up history of bilateral breast cancer and BRCA2 +  Oncology History  Malignant neoplasm of central portion of right breast in female, estrogen receptor positive (HCC)  09/12/2018 Initial Diagnosis   Ductal carcinoma in situ (DCIS) of right breast   09/17/2018 Cancer Staging   Staging form: Breast, AJCC 8th Edition - Clinical: Stage 0 (cTis (DCIS), cN0, cM0, ER+, PR+, HER2: Not Assessed) - Signed by Loa Socks, NP on 09/17/2018   10/02/2018 Cancer Staging   Staging form: Breast, AJCC 8th Edition - Pathologic stage from 10/02/2018: Stage IA (pT1b, pN0, cM0, G2, ER+, PR+, HER2-) - Signed by Loa Socks, NP on 10/15/2018      CURRENT THERAPY: Surveillance  INTERVAL HISTORY [name] returns for follow-up as scheduled, last seen by me 09/18/2022.  Doing well in the interim, remains healthy and active.  Takes care of her grandkids, social, and feeling well without changes.  Mammogram coming up at [facility].  ROS  All other systems reviewed and negative  Past Medical History:  Diagnosis Date   Anxiety    Anxiety disorder    BRCA2 positive 07/14/2012   Cancer (HCC) 2008   left breast   Dyslipidemia    Fall    Goiter    History of radiation therapy    Hypothyroidism    Osteoporosis      Past Surgical History:  Procedure Laterality Date   ABDOMINAL HYSTERECTOMY  2009    with BSO   BREAST LUMPECTOMY  [id number], 1991   BREAST LUMPECTOMY WITH RADIOACTIVE SEED LOCALIZATION Right 10/02/2018   Procedure: RIGHT BREAST LUMPECTOMY WITH RADIOACTIVE SEED LOCALIZATION;  Surgeon: Griselda Miner, MD;  Location: [facility];  Service: General;  Laterality: Right;   COLONOSCOPY N/A 12/13/2015   Procedure: COLONOSCOPY;  Surgeon: Charolett Bumpers, MD;  Location: [facility];  Service: Endoscopy;  Laterality: N/A;   FRACTURE SURGERY  2015   left hip   HARDWARE REMOVAL Left 07/08/2015   Procedure: LEFT HIP HARDWARE REMOVAL;  Surgeon: Ollen Gross, MD;  Location: [facility];  Service: Orthopedics;  Laterality: Left;     Outpatient Encounter Medications as of 03/21/2023  Medication Sig   atorvastatin (LIPITOR) 10 MG tablet Take 5 mg by mouth daily.    Biotin 1 MG CAPS Take by mouth.   BL EVENING PRIMROSE OIL PO Take by mouth.   CALCIUM PO Take by mouth.   clorazepate (TRANXENE) 7.5 MG tablet Take 15 mg by mouth at bedtime.    levothyroxine (SYNTHROID, LEVOTHROID) 125 MCG tablet Take 125 mcg by mouth daily. Take 5 days a  week.   Omega-3 1000 MG CAPS Take by mouth.   traZODone (DESYREL) 100 MG tablet Take 100 mg by mouth at bedtime.   VITAMIN D PO Take by mouth. drops   No facility-administered encounter medications on file as of 03/21/2023.     Today's Vitals   03/21/23 1100 03/21/23 1123  BP:  137/71  Pulse:  (!) 58  Resp:  15  Temp:  Marland Kitchen)  97.3 F (36.3 C)  TempSrc:  Temporal  SpO2:  99%  Weight:  147 lb 1.6 oz (66.7 kg)  PainSc: 0-No pain    Body mass index is 25.25 kg/m.   PHYSICAL EXAM GENERAL:alert, no distress and comfortable SKIN: no rash  EYES: sclera clear NECK: without mass LYMPH:  no palpable cervical or supraclavicular lymphadenopathy  LUNGS:  normal breathing effort HEART:  no lower extremity edema ABDOMEN: abdomen soft, non-tender and normal bowel sounds NEURO: alert & oriented x 3 with fluent speech, no focal motor/sensory  deficits Breast exam: No nipple discharge or inversion.  S/p bilateral lumpectomies, incisions completely healed.  No palpable mass or nodularity in either breast or axilla that I could appreciate    CBC    Component Value Date/Time   WBC 6.0 11/19/2022 1201   RBC 4.82 11/19/2022 1201   HGB 13.6 11/19/2022 1201   HGB 12.9 09/18/2022 1032   HGB 12.5 02/15/2016 1229   HCT 41.6 11/19/2022 1201   HCT 37.6 02/15/2016 1229   PLT 246.0 11/19/2022 1201   PLT 217 09/18/2022 1032   PLT 224 02/15/2016 1229   MCV 86.2 11/19/2022 1201   MCV 87.0 02/15/2016 1229   MCH 29.3 09/18/2022 1032   MCHC 32.7 11/19/2022 1201   RDW 13.2 11/19/2022 1201   RDW 12.2 02/15/2016 1229   LYMPHSABS 1.6 11/19/2022 1201   LYMPHSABS 1.9 02/15/2016 1229   MONOABS 0.5 11/19/2022 1201   MONOABS 0.6 02/15/2016 1229   EOSABS 0.2 11/19/2022 1201   EOSABS 0.2 02/15/2016 1229   BASOSABS 0.0 11/19/2022 1201   BASOSABS 0.0 02/15/2016 1229     CMP     Component Value Date/Time   NA 140 11/19/2022 1201   NA 138 02/15/2016 1229   K 4.3 11/19/2022 1201   K 4.5 02/15/2016 1229   CL 103 11/19/2022 1201   CL 106 07/16/2012 1201   CO2 27 11/19/2022 1201   CO2 26 02/15/2016 1229   GLUCOSE 106 (H) 11/19/2022 1201   GLUCOSE 92 02/15/2016 1229   GLUCOSE 98 07/16/2012 1201   BUN 14 11/19/2022 1201   BUN 13.7 02/15/2016 1229   CREATININE 0.93 11/19/2022 1201   CREATININE 0.90 09/18/2022 1032   CREATININE 0.9 02/15/2016 1229   CALCIUM 10.1 11/19/2022 1201   CALCIUM 10.4 02/15/2016 1229   PROT 7.6 11/19/2022 1201   PROT 7.3 02/15/2016 1229   ALBUMIN 4.6 11/19/2022 1201   ALBUMIN 3.9 02/15/2016 1229   AST 16 11/19/2022 1201   AST 22 09/18/2022 1032   AST 22 02/15/2016 1229   ALT 15 11/19/2022 1201   ALT 23 09/18/2022 1032   ALT 25 02/15/2016 1229   ALKPHOS 64 11/19/2022 1201   ALKPHOS 54 02/15/2016 1229   BILITOT 0.7 11/19/2022 1201   BILITOT 0.4 09/18/2022 1032   BILITOT 0.39 02/15/2016 1229   GFRNONAA >60  09/18/2022 1032   GFRAA >60 03/12/2019 1021     ASSESSMENT & PLAN:[name] is a 67 yo post hysterectomy female with    1. Malignant neoplasm of central portion of right breast, stage 1, pT1b, cN0 IDC, grade 2 -Initially diagnosed with DCIS 09/10/18. Lumpectomy 10/02/18 showed invasive ductal carcinoma. S/p radiation 11/06/18 - 12/25/18.  -Took exemestane 01/2019 - 03/2021, discontinued due to osteoporosis and small benefit. -Mammo 03/2022 was benign, breast MRI 09/11/22 detected a L breast non mass enhancement and incidental opacity in the R lung.  -Follow up CXR image is clear to me, report notes the  MRI finding is not well  visualized. She has no respiratory symptoms, non smoker. We decided to hold on CT chest.  -She went back for MRI guided Bx of L br on 09/14/22 but the area appeared more like a vessel, and bx was not done. Short term MRI 12/14/22 showed persistent linear non mass enhancement in the lateral posterior left breast, bx was benign and concordant. -Ms. Rae is clinically doing well.  Exam is benign, recent PCP labs are unremarkable.  Overall no clinical concern for recurrence. -Continue surveillance with annual mammo/MRI staggered 6 months apart -I reviewed her imaging to date; will do next mammo 03/2023 at [facility] and next MRI 06/2023, then 12/2023 -Follow up with me in 1 year, or sooner if needed   2.  H/o Left breast stage T2 N0 IDC, ER+/PR+/Her2-  -s/p lumpectomy 03/2007. Oncotype RS 20, low risk.  -Took antiestrogens from 07/2007 - 09/2018 (Arimidex, tamoxifen, and raloxifene).   3. BRCA2+ -found to be BRCA2+ at time of first breast cancer. Bilateral mastectomies not felt to be necessary at that time, but she did undergo TAH w/ BSO 10/2007. -She has adult 2 sons, both tested positive and followed by oncologists where they live -We continue to discuss pancreatic screening and I previously discussed with her GI Dr. Myrtie Neither.  -A maternal first cousin who was BRCA+ had pancreatic  cancer.  -I again reviewed the guidelines/data which is not definitive but recommends screening if BRCA2+ with 1 first degree relative or 2 non-first degree relatives -We weighed risks/benefits of screening MRI including the stress that an additional MRI/findings/possible bx might bring -Given the above, we agreed to hold for now.  -She knows to let me know if she changes her mind   4. Osteoporosis -most recent DEXA 03/23/21 showed T-score -2.9 at right hip. Though density in lumbar spine worsened some, her hip slightly improved from -3.1 in 03/2020. -she was previously on fosamax for many years then prolia injections for one year.  Currently on a treatment holiday -follows with Dr. Donnie Coffin at [facility].   5. Health maintenance  -I encouraged her to continue healthy active lifestyle and risk reducing behaviors.  -Established with new PCP Dr. Lawerance Bach PCP in August 2024. I reviewed those labs   PLAN: -Recent imaging and outside labs reviewed -Continue breast cancer surveillance - Mammo 03/2023 at [facility], then breast MRI 06/2023 -Continued discussion re: pancreatic cancer screening, on hold -F/up with me in 1 year -Continue healthy active lifestyle and age appropriate health maintenance with PCP, GI, care team   Orders Placed This Encounter  Procedures   MR BREAST BILATERAL W WO CONTRAST INC CAD    Standing Status:   Future    Expected Date:   06/19/2023    Expiration Date:   03/20/2024    If indicated for the ordered procedure, I authorize the administration of contrast media per Radiology protocol:   Yes    What is the patient's sedation requirement?:   No Sedation    Does the patient have a pacemaker or implanted devices?:   No    Preferred imaging location?:   [facility] (table limit-550lbs)     All questions were answered. The patient knows to call the clinic with any problems, questions or concerns. No barriers to learning were detected. I spent 20 minutes counseling the patient  face to face. The total time spent in the appointment was 30 minutes and more than 50% was on counseling, review of test results, and  coordination of care.   Santiago Glad, NP-C 03/21/2023

## 2023-04-08 ENCOUNTER — Encounter: Payer: Self-pay | Admitting: Nurse Practitioner

## 2023-04-15 ENCOUNTER — Encounter: Payer: Self-pay | Admitting: Nurse Practitioner

## 2023-04-16 ENCOUNTER — Encounter: Payer: Self-pay | Admitting: Nurse Practitioner

## 2023-04-22 ENCOUNTER — Encounter: Payer: Self-pay | Admitting: Hematology

## 2023-04-23 ENCOUNTER — Encounter: Payer: Self-pay | Admitting: Nurse Practitioner

## 2023-06-07 ENCOUNTER — Other Ambulatory Visit: Payer: Self-pay | Admitting: Internal Medicine

## 2023-06-07 NOTE — Telephone Encounter (Signed)
 Copied from CRM [id number]. Topic: Clinical - Medication Refill >> Jun 07, 2023  3:11 PM Sim Boast F wrote: Most Recent Primary Care Visit:  Provider: Wyvonne Lenz  Department: [facility]  Visit Type: MEDICARE AWV, INITIAL  Date: 03/18/2023  Medication: Atovastatin and Levothyroxine  Has the patient contacted their pharmacy? Yes, needs new script sent to [facility]   (Agent: If no, request that the patient contact the pharmacy for the refill. If patient does not wish to contact the pharmacy document the reason why and proceed with request.) (Agent: If yes, when and what did the pharmacy advise?)  Is this the correct pharmacy for this prescription? Yes If no, delete pharmacy and type the correct one.  This is the patient's preferred pharmacy:   [facility] - [place], [place] - [address] [[id number]]  Has the prescription been filled recently? Yes  Is the patient out of the medication? No  Has the patient been seen for an appointment in the last year OR does the patient have an upcoming appointment? Yes  Can we respond through MyChart? Yes  Agent: Please be advised that Rx refills may take up to 3 business days. We ask that you follow-up with your pharmacy.

## 2023-06-10 MED ORDER — ATORVASTATIN CALCIUM 10 MG PO TABS: 5.0000 mg | ORAL_TABLET | Freq: Every day | ORAL | 1 refills | Status: DC

## 2023-06-10 MED ORDER — LEVOTHYROXINE SODIUM 125 MCG PO TABS: 125.0000 ug | ORAL_TABLET | Freq: Every day | ORAL | 1 refills | Status: DC

## 2023-06-13 ENCOUNTER — Encounter: Payer: Self-pay | Admitting: Internal Medicine

## 2023-06-14 ENCOUNTER — Other Ambulatory Visit: Payer: Self-pay

## 2023-06-14 MED ORDER — ATORVASTATIN CALCIUM 10 MG PO TABS: 5.0000 mg | ORAL_TABLET | Freq: Every day | ORAL | 1 refills | Status: AC

## 2023-06-14 MED ORDER — LEVOTHYROXINE SODIUM 125 MCG PO TABS: 125.0000 ug | ORAL_TABLET | Freq: Every day | ORAL | 1 refills | Status: DC

## 2023-06-21 ENCOUNTER — Ambulatory Visit
Admission: RE | Admit: 2023-06-21 | Discharge: 2023-06-21 | Disposition: A | Discharge status: Home / Self Care | Payer: Medicare Other | Source: Ambulatory Visit | Attending: Nurse Practitioner | Admitting: Nurse Practitioner

## 2023-06-21 DIAGNOSIS — C50111 Malignant neoplasm of central portion of right female breast: Secondary | ICD-10-CM

## 2023-06-21 DIAGNOSIS — C50912 Malignant neoplasm of unspecified site of left female breast: Secondary | ICD-10-CM

## 2023-06-21 DIAGNOSIS — C50412 Malignant neoplasm of upper-outer quadrant of left female breast: Secondary | ICD-10-CM

## 2023-06-21 DIAGNOSIS — R928 Other abnormal and inconclusive findings on diagnostic imaging of breast: Secondary | ICD-10-CM

## 2023-06-21 MED ORDER — GADOPICLENOL 0.5 MMOL/ML IV SOLN
6.6000 mL | Freq: Once | INTRAVENOUS | Status: AC | PRN
  Administered 2023-06-21: 6.6 mL via INTRAVENOUS

## 2023-07-09 ENCOUNTER — Encounter: Payer: Self-pay | Admitting: Internal Medicine

## 2023-07-15 ENCOUNTER — Other Ambulatory Visit: Payer: Self-pay | Admitting: Radiology

## 2023-07-16 LAB — SURGICAL PATHOLOGY

## 2023-07-17 ENCOUNTER — Encounter: Payer: Self-pay | Admitting: Internal Medicine

## 2023-07-17 ENCOUNTER — Telehealth: Payer: Self-pay | Admitting: Hematology

## 2023-07-17 DIAGNOSIS — Z171 Estrogen receptor negative status [ER-]: Secondary | ICD-10-CM | POA: Insufficient documentation

## 2023-07-17 NOTE — Assessment & Plan Note (Addendum)
-  cT1bN0M0, stage IB, ER-/PR-/HER2- -breast MRI 09/11/22 detected a non-mass enhancement in L breast but it was felt to be a blood vessel when biopsy was attempted.  A close follow-up breast MRI 12/14/22 showed persistent linear non mass enhancement in the lateral posterior left breast, bx was benign and concordant.  -breast MRI 06/21/2023 showed a 1.0 cm of non mass enhancement/possible enhancing mass with progressive Kinetics which is suspicious for malignancy, biopsy showed grade 2 invasive ductal carcinoma, triple negative.  The area measures 0.8 cm on ultrasound. -I recommend left mastectomy, also discussed option of bilateral mastectomy due to the third episode of bilateral breast cancer and BRCA2 mutation (+). -She will likely need adjuvant chemotherapy given his triple negative disease.

## 2023-07-17 NOTE — Assessment & Plan Note (Signed)
-  found to be BRCA2+ at time of first breast cancer. Bilateral mastectomies not felt to be necessary at that time, but she did undergo TAH w/ BSO 10/2007. -She has adult 2 sons, both tested positive and followed by oncologists where they live -We continue to discuss pancreatic screening and I previously discussed with her GI Dr. Dominic Friendly.  -A maternal first cousin who was BRCA+ had pancreatic cancer.  -I again reviewed the guidelines/data which is not definitive but recommends screening if BRCA2+ with 1 first degree relative or 2 non-first degree relatives

## 2023-07-17 NOTE — Assessment & Plan Note (Signed)
 stage T2 N0 IDC, ER+/PR+/Her2-  -s/p lumpectomy 03/2007. Oncotype RS 20, low risk.  -Took antiestrogens from 07/2007 - 09/2018 (Arimidex, tamoxifen, and raloxifene).

## 2023-07-17 NOTE — Assessment & Plan Note (Signed)
 stage 1, pT1b, cN0 IDC, grade 2 -Initially diagnosed with DCIS 09/10/18. Lumpectomy 10/02/18 showed invasive ductal carcinoma. S/p radiation 11/06/18 - 12/25/18.  -Took exemestane 01/2019 - 03/2021, discontinued due to osteoporosis and small benefit.

## 2023-07-18 ENCOUNTER — Other Ambulatory Visit: Payer: Self-pay | Admitting: *Deleted

## 2023-07-18 ENCOUNTER — Inpatient Hospital Stay

## 2023-07-18 ENCOUNTER — Ambulatory Visit: Admitting: Nurse Practitioner

## 2023-07-18 ENCOUNTER — Telehealth: Payer: Self-pay | Admitting: *Deleted

## 2023-07-18 ENCOUNTER — Inpatient Hospital Stay: Attending: Hematology | Admitting: Hematology

## 2023-07-18 VITALS — BP 138/80 | HR 76 | Temp 97.6°F | Resp 17 | Wt 144.5 lb

## 2023-07-18 DIAGNOSIS — C50912 Malignant neoplasm of unspecified site of left female breast: Secondary | ICD-10-CM

## 2023-07-18 DIAGNOSIS — C50112 Malignant neoplasm of central portion of left female breast: Secondary | ICD-10-CM

## 2023-07-18 DIAGNOSIS — Z1501 Genetic susceptibility to malignant neoplasm of breast: Secondary | ICD-10-CM | POA: Diagnosis not present

## 2023-07-18 DIAGNOSIS — C50111 Malignant neoplasm of central portion of right female breast: Secondary | ICD-10-CM | POA: Insufficient documentation

## 2023-07-18 DIAGNOSIS — Z1502 Genetic susceptibility to malignant neoplasm of ovary: Secondary | ICD-10-CM

## 2023-07-18 DIAGNOSIS — C50412 Malignant neoplasm of upper-outer quadrant of left female breast: Secondary | ICD-10-CM | POA: Diagnosis not present

## 2023-07-18 DIAGNOSIS — Z171 Estrogen receptor negative status [ER-]: Secondary | ICD-10-CM | POA: Insufficient documentation

## 2023-07-18 DIAGNOSIS — Z79890 Hormone replacement therapy (postmenopausal): Secondary | ICD-10-CM | POA: Diagnosis not present

## 2023-07-18 DIAGNOSIS — Z79899 Other long term (current) drug therapy: Secondary | ICD-10-CM | POA: Insufficient documentation

## 2023-07-18 DIAGNOSIS — E785 Hyperlipidemia, unspecified: Secondary | ICD-10-CM | POA: Diagnosis not present

## 2023-07-18 DIAGNOSIS — Z170 Estrogen receptor positive status [ER+]: Secondary | ICD-10-CM

## 2023-07-18 DIAGNOSIS — Z1509 Genetic susceptibility to other malignant neoplasm: Secondary | ICD-10-CM

## 2023-07-18 DIAGNOSIS — M810 Age-related osteoporosis without current pathological fracture: Secondary | ICD-10-CM | POA: Diagnosis not present

## 2023-07-18 DIAGNOSIS — E039 Hypothyroidism, unspecified: Secondary | ICD-10-CM | POA: Diagnosis not present

## 2023-07-18 DIAGNOSIS — Z923 Personal history of irradiation: Secondary | ICD-10-CM | POA: Diagnosis not present

## 2023-07-18 LAB — CMP (CANCER CENTER ONLY)
ALT: 17 U/L (ref 0–44)
AST: 19 U/L (ref 15–41)
Albumin: 4.8 g/dL (ref 3.5–5.0)
Alkaline Phosphatase: 59 U/L (ref 38–126)
Anion gap: 4 — ABNORMAL LOW (ref 5–15)
BUN: 12 mg/dL (ref 8–23)
CO2: 29 mmol/L (ref 22–32)
Calcium: 10 mg/dL (ref 8.9–10.3)
Chloride: 106 mmol/L (ref 98–111)
Creatinine: 0.93 mg/dL (ref 0.44–1.00)
GFR, Estimated: >60 mL/min (ref >60)
Glucose, Bld: 105 mg/dL — ABNORMAL HIGH (ref 70–99)
Potassium: 4.2 mmol/L (ref 3.5–5.1)
Sodium: 139 mmol/L (ref 135–145)
Total Bilirubin: 0.6 mg/dL (ref 0.0–1.2)
Total Protein: 7.7 g/dL (ref 6.5–8.1)

## 2023-07-18 LAB — CBC WITH DIFFERENTIAL (CANCER CENTER ONLY)
Abs Immature Granulocytes: 0.01 10*3/uL (ref 0.00–0.07)
Basophils Absolute: 0.1 10*3/uL (ref 0.0–0.1)
Basophils Relative: 1 %
Eosinophils Absolute: 0.1 10*3/uL (ref 0.0–0.5)
Eosinophils Relative: 2 %
HCT: 39.9 % (ref 36.0–46.0)
Hemoglobin: 13.3 g/dL (ref 12.0–15.0)
Immature Granulocytes: 0 %
Lymphocytes Relative: 23 %
Lymphs Abs: 1.1 10*3/uL (ref 0.7–4.0)
MCH: 28.5 pg (ref 26.0–34.0)
MCHC: 33.3 g/dL (ref 30.0–36.0)
MCV: 85.4 fL (ref 80.0–100.0)
Monocytes Absolute: 0.3 10*3/uL (ref 0.1–1.0)
Monocytes Relative: 7 %
Neutro Abs: 3.3 10*3/uL (ref 1.7–7.7)
Neutrophils Relative %: 67 %
Platelet Count: 263 10*3/uL (ref 150–400)
RBC: 4.67 MIL/uL (ref 3.87–5.11)
RDW: 12.1 % (ref 11.5–15.5)
WBC Count: 4.9 10*3/uL (ref 4.0–10.5)
nRBC: 0 % (ref 0.0–0.2)

## 2023-07-18 NOTE — Telephone Encounter (Signed)
 LVM to patient in reference to upcoming appointment, left my contact to call back also gave number to scheduling as well

## 2023-07-18 NOTE — Progress Notes (Signed)
 [facility]   Telephone:(336) [phone] Fax:(336) [phone]   Clinic Follow up Note   Patient Care Team: Colene Dauphin, MD as PCP - General (Internal Medicine) Alba Ally, MD as Consulting Physician (Psychiatry) Liliane Rei, MD as Consulting Physician (Orthopedic Surgery) Terri Fester, MD as Consulting Physician (Obstetrics and Gynecology) Retta Caster, MD as Consulting Physician (Radiation Oncology) Caralyn Chandler, MD as Consulting Physician (General Surgery) Sonja Poulan, MD as Consulting Physician (Hematology) [facility] as Consulting Physician (Radiology) Guinevere Lefevre, OD as Referring Physician (Optometry)  Date of Service:  07/18/2023  CHIEF COMPLAINT: newly diagnosed left triple negative breast cancer  CURRENT THERAPY:  Pending   Oncology History   Malignant neoplasm of central portion of right breast in female, estrogen receptor positive (HCC) stage 1, pT1b, cN0 IDC, grade 2 -Initially diagnosed with DCIS 09/10/18. Lumpectomy 10/02/18 showed invasive ductal carcinoma. S/p radiation 11/06/18 - 12/25/18.  -Took exemestane 01/2019 - 03/2021, discontinued due to osteoporosis and small benefit.  BRCA2 gene mutation positive in female -found to be BRCA2+ at time of first breast cancer. Bilateral mastectomies not felt to be necessary at that time, but she did undergo TAH w/ BSO 10/2007. -She has adult 2 sons, both tested positive and followed by oncologists where they live -We continue to discuss pancreatic screening and I previously discussed with her GI Dr. Dominic Friendly.  -A maternal first cousin who was BRCA+ had pancreatic cancer.  -I again reviewed the guidelines/data which is not definitive but recommends screening if BRCA2+ with 1 first degree relative or 2 non-first degree relatives  Malignant neoplasm of upper-outer quadrant of left breast in female, estrogen receptor positive (HCC) stage T2 N0 IDC, ER+/PR+/Her2-  -s/p lumpectomy  03/2007. Oncotype RS 20, low risk.  -Took antiestrogens from 07/2007 - 09/2018 (Arimidex, tamoxifen, and raloxifene).  Malignant neoplasm of central portion of left breast in female, estrogen receptor negative (HCC) -cT1bN0M0, stage IB, ER-/PR-/HER2- -breast MRI 09/11/22 detected a non-mass enhancement in L breast but it was felt to be a blood vessel when biopsy was attempted.  A close follow-up breast MRI 12/14/22 showed persistent linear non mass enhancement in the lateral posterior left breast, bx was benign and concordant.  -breast MRI 06/21/2023 showed a 1.0 cm of non mass enhancement/possible enhancing mass with progressive Kinetics which is suspicious for malignancy, biopsy showed grade 2 invasive ductal carcinoma, triple negative.  The area measures 0.8 cm on ultrasound. -I recommend left mastectomy, also discussed option of bilateral mastectomy due to the third episode of bilateral breast cancer and BRCA2 mutation (+). -She will likely need adjuvant chemotherapy given his triple negative disease.   Assessment & Plan Triple negative left breast cancer Newly diagnosed triple negative breast cancer in the right breast, measuring approximately 0.8 to 1 cm. This is a different type from previous ER-positive cancers. The tumor is small, and lymph nodes appear uninvolved. She has a history of breast cancer in both breasts, with previous radiation to the left breast in 2008. Due to prior radiation, mastectomy is recommended to prevent recurrence. She is considering a bilateral mastectomy for stress reduction and prevention. Chemotherapy is indicated due to the aggressive nature of triple negative breast cancer, despite the early stage and small tumor size. She is informed about the potential need for chemotherapy post-surgery, with a regimen of TC (docetaxel and cyclophosphamide) every three weeks for four cycles if the tumor is small and not in lymph nodes. Discussed the potential side effects of  chemotherapy,  including fatigue, low blood counts, risk of infection, and hair loss. She is interested in using a DigniCap to reduce hair loss. She is concerned about the curative potential of treatment and is informed that the chance of cure is approximately 80% if the cancer is not in the lymph nodes. Discussed the possibility of a more intensive chemotherapy regimen if the tumor is larger or involves lymph nodes, which would extend treatment to five or six months with three drugs. - Recommend bilateral mastectomy with reconstruction. - Refer to plastic surgeon for reconstruction consultation. - Order CT scan and bone scan to rule out metastatic disease, pending insurance approval. - Schedule chemotherapy education class after surgery. - Discuss DigniCap for hair preservation during chemotherapy. - Order labs to check kidney function and tumor marker CA 27.29. - Coordinate with breast cancer navigator for care management.  History of bilateral ER-positive breast cancer ER-positive breast cancer in both breasts, with previous radiation to the left breast in 2008. She has undergone treatment with tamoxifen and had a hysterectomy in the past. The current diagnosis of triple negative breast cancer is a new primary cancer, not a recurrence.  Plan - I reviewed her recent left breast biopsy results -Will order CT chest, abdomen pelvis with contrast and bone scan for staging -She is scheduled for a second opinion with Dr. Dwain Sarna on May 2nd.  I recommend port placement during surgery. -refer her to  plastic surgeon Dr. Leta Baptist - Follow-up 2 to 3 weeks after surgery, to finalize her adjuvant chemo.     SUMMARY OF ONCOLOGIC HISTORY: Oncology History  Malignant neoplasm of central portion of right breast in female, estrogen receptor positive (HCC)  09/12/2018 Initial Diagnosis   Ductal carcinoma in situ (DCIS) of right breast   09/17/2018 Cancer Staging   Staging form: Breast, AJCC 8th  Edition - Clinical: Stage 0 (cTis (DCIS), cN0, cM0, ER+, PR+, HER2: Not Assessed) - Signed by Loa Socks, NP on 09/17/2018   10/02/2018 Cancer Staging   Staging form: Breast, AJCC 8th Edition - Pathologic stage from 10/02/2018: Stage IA (pT1b, pN0, cM0, G2, ER+, PR+, HER2-) - Signed by Loa Socks, NP on 10/15/2018   Malignant neoplasm of central portion of left breast in female, estrogen receptor negative (HCC)  07/15/2023 Cancer Staging   Staging form: Breast, AJCC 8th Edition - Clinical stage from 07/15/2023: Stage IB (cT1b, cN0, cM0, G2, ER-, PR-, HER2-) - Signed by Malachy Mood, MD on 07/17/2023 Stage prefix: Initial diagnosis Histologic grading system: 3 grade system   07/17/2023 Initial Diagnosis   Malignant neoplasm of central portion of left breast in female, estrogen receptor negative (HCC)      Discussed the use of AI scribe software for clinical note transcription with the patient, who gave verbal consent to proceed.  History of Present Illness A 68 year old patient with a history of breast cancer presents for a follow-up visit after a recent diagnosis of triple negative breast cancer. The patient reports that the cancer was discovered on a mammogram and confirmed by biopsy. This is the third time the patient has been diagnosed with breast cancer, with previous instances being ER positive. The patient has previously undergone radiation therapy and lumpectomy. The patient expresses anxiety and stress related to the diagnosis and the prospect of undergoing treatment again. The patient has not previously undergone chemotherapy but is open to the possibility as part of the treatment plan for the current diagnosis.     All other systems were reviewed with  the patient and are negative.  MEDICAL HISTORY:  Past Medical History:  Diagnosis Date   Anxiety    Anxiety disorder    BRCA2 positive 07/14/2012   Cancer (HCC) 2008   left breast   Dyslipidemia    Fall     Goiter    History of radiation therapy    Hypothyroidism    Osteoporosis     SURGICAL HISTORY: Past Surgical History:  Procedure Laterality Date   ABDOMINAL HYSTERECTOMY  2009   with BSO   BREAST LUMPECTOMY  1610,9604, 1991   BREAST LUMPECTOMY WITH RADIOACTIVE SEED LOCALIZATION Right 10/02/2018   Procedure: RIGHT BREAST LUMPECTOMY WITH RADIOACTIVE SEED LOCALIZATION;  Surgeon: Griselda Miner, MD;  Location: [facility];  Service: General;  Laterality: Right;   COLONOSCOPY N/A 12/13/2015   Procedure: COLONOSCOPY;  Surgeon: Charolett Bumpers, MD;  Location: [facility];  Service: Endoscopy;  Laterality: N/A;   FRACTURE SURGERY  2015   left hip   HARDWARE REMOVAL Left 07/08/2015   Procedure: LEFT HIP HARDWARE REMOVAL;  Surgeon: Ollen Gross, MD;  Location: [facility];  Service: Orthopedics;  Laterality: Left;    I have reviewed the social history and family history with the patient and they are unchanged from previous note.  ALLERGIES:  is allergic to adhesive [tape], chloraprep one step [chlorhexidine gluconate], latex, betadine [povidone iodine], cephalexin, ampicillin, and macrodantin [nitrofurantoin].  MEDICATIONS:  Current Outpatient Medications  Medication Sig Dispense Refill   atorvastatin (LIPITOR) 10 MG tablet Take 0.5 tablets (5 mg total) by mouth daily. 90 tablet 1   Biotin 1 MG CAPS Take by mouth.     clorazepate (TRANXENE) 7.5 MG tablet Take 15 mg by mouth at bedtime.      levothyroxine (SYNTHROID) 125 MCG tablet Take 1 tablet (125 mcg total) by mouth daily. Take 5 days a  week. 90 tablet 1   Omega-3 1000 MG CAPS Take by mouth.     traZODone (DESYREL) 100 MG tablet Take 100 mg by mouth at bedtime.     VITAMIN D PO Take by mouth. drops     BL EVENING PRIMROSE OIL PO Take by mouth.     CALCIUM PO Take by mouth.     No current facility-administered medications for this visit.    PHYSICAL EXAMINATION: ECOG PERFORMANCE STATUS: 0 - Asymptomatic  Vitals:    07/18/23 0843 07/18/23 0858  BP: (!) 150/80 138/80  Pulse: 76   Resp: 17   Temp: 97.6 F (36.4 C)   SpO2: 100%    Wt Readings from Last 3 Encounters:  07/18/23 144 lb 8 oz (65.5 kg)  03/21/23 147 lb 1.6 oz (66.7 kg)  03/18/23 147 lb (66.7 kg)     GENERAL:alert, no distress and comfortable SKIN: skin color, texture, turgor are normal, no rashes or significant lesions EYES: normal, Conjunctiva are pink and non-injected, sclera clear NECK: supple, thyroid normal size, non-tender, without nodularity LYMPH:  no palpable lymphadenopathy in the cervical, axillary  LUNGS: clear to auscultation and percussion with normal breathing effort HEART: regular rate & rhythm and no murmurs and no lower extremity edema ABDOMEN:abdomen soft, non-tender and normal bowel sounds Musculoskeletal:no cyanosis of digits and no clubbing  NEURO: alert & oriented x 3 with fluent speech, no focal motor/sensory deficits  Physical Exam    LABORATORY DATA:  I have reviewed the data as listed    Latest Ref Rng & Units 11/19/2022   12:01 PM 09/18/2022   10:32 AM 03/20/2022  10:03 AM  CBC  WBC 4.0 - 10.5 K/uL 6.0  5.2  5.2   Hemoglobin 12.0 - 15.0 g/dL 16.1  09.6  04.5   Hematocrit 36.0 - 46.0 % 41.6  37.5  38.5   Platelets 150.0 - 400.0 K/uL 246.0  217  223         Latest Ref Rng & Units 11/19/2022   12:01 PM 09/18/2022   10:32 AM 03/20/2022   10:03 AM  CMP  Glucose 70 - 99 mg/dL 409  811  914   BUN 6 - 23 mg/dL 14  14  16    Creatinine 0.40 - 1.20 mg/dL 7.82  9.56  2.13   Sodium 135 - 145 mEq/L 140  138  141   Potassium 3.5 - 5.1 mEq/L 4.3  4.3  4.4   Chloride 96 - 112 mEq/L 103  107  107   CO2 19 - 32 mEq/L 27  26  27    Calcium 8.4 - 10.5 mg/dL 08.6  9.6  9.9   Total Protein 6.0 - 8.3 g/dL 7.6  6.5  7.0   Total Bilirubin 0.2 - 1.2 mg/dL 0.7  0.4  0.6   Alkaline Phos 39 - 117 U/L 64  60  47   AST 0 - 37 U/L 16  22  19    ALT 0 - 35 U/L 15  23  16        RADIOGRAPHIC STUDIES: I have  personally reviewed the radiological images as listed and agreed with the findings in the report. No results found.    Orders Placed This Encounter  Procedures   CT CHEST ABDOMEN PELVIS W CONTRAST    Standing Status:   Future    Expected Date:   08/01/2023    Expiration Date:   07/17/2024    If indicated for the ordered procedure, I authorize the administration of contrast media per Radiology protocol:   Yes    Does the patient have a contrast media/X-ray dye allergy?:   No    Preferred imaging location?:   [facility]    If indicated for the ordered procedure, I authorize the administration of oral contrast media per Radiology protocol:   Yes   NM Bone Scan Whole Body    Standing Status:   Future    Expected Date:   08/01/2023    Expiration Date:   07/17/2024    If indicated for the ordered procedure, I authorize the administration of a radiopharmaceutical per Radiology protocol:   Yes    Preferred imaging location?:   [facility]   [place] 27.29    Standing Status:   Future    Expected Date:   07/18/2023    Expiration Date:   07/17/2024   Ambulatory referral to Plastic Surgery    Referral Priority:   Routine    Referral Type:   Surgical    Referral Reason:   Specialty Services Required    Requested Specialty:   Plastic Surgery    Number of Visits Requested:   1   All questions were answered. The patient knows to call the clinic with any problems, questions or concerns. No barriers to learning was detected. The total time spent in the appointment was 40 minutes.     Sonja Mercer, MD 07/18/2023

## 2023-07-19 ENCOUNTER — Other Ambulatory Visit: Payer: Self-pay

## 2023-07-19 LAB — CANCER ANTIGEN 27.29: CA 27.29: 18.5 U/mL (ref 0.0–38.6)

## 2023-07-19 NOTE — Progress Notes (Signed)
 Faxed new patient referral to Dr. Arelia at [facility] as per Dr. Lanny. Faxed copy of referral, copy of insurance cards, last office note, and copy of demographics. Received fax confirmation of receipt. [phone].

## 2023-07-29 ENCOUNTER — Encounter (HOSPITAL_COMMUNITY)

## 2023-07-29 ENCOUNTER — Other Ambulatory Visit (HOSPITAL_COMMUNITY)

## 2023-07-31 ENCOUNTER — Encounter (HOSPITAL_COMMUNITY)
Admission: RE | Admit: 2023-07-31 | Discharge: 2023-07-31 | Disposition: A | Discharge status: Home / Self Care | Source: Ambulatory Visit | Attending: Hematology | Admitting: Hematology

## 2023-07-31 DIAGNOSIS — C50111 Malignant neoplasm of central portion of right female breast: Secondary | ICD-10-CM | POA: Diagnosis present

## 2023-07-31 DIAGNOSIS — Z170 Estrogen receptor positive status [ER+]: Secondary | ICD-10-CM | POA: Insufficient documentation

## 2023-07-31 MED ORDER — TECHNETIUM TC 99M MEDRONATE IV KIT
19.2000 | PACK | Freq: Once | INTRAVENOUS | Status: AC
  Administered 2023-07-31: 19.2 via INTRAVENOUS

## 2023-08-01 ENCOUNTER — Encounter: Payer: Self-pay | Admitting: *Deleted

## 2023-08-01 ENCOUNTER — Encounter (HOSPITAL_COMMUNITY)
Admission: RE | Admit: 2023-08-01 | Discharge: 2023-08-01 | Disposition: A | Discharge status: Home / Self Care | Source: Ambulatory Visit | Attending: Hematology | Admitting: Hematology

## 2023-08-01 DIAGNOSIS — C50111 Malignant neoplasm of central portion of right female breast: Secondary | ICD-10-CM

## 2023-08-01 MED ORDER — IOHEXOL 300 MG/ML  SOLN
100.0000 mL | Freq: Once | INTRAMUSCULAR | Status: AC | PRN
  Administered 2023-08-01: 100 mL via INTRAVENOUS

## 2023-08-02 ENCOUNTER — Encounter: Payer: Self-pay | Admitting: *Deleted

## 2023-08-02 ENCOUNTER — Encounter: Payer: Self-pay | Admitting: Hematology

## 2023-08-08 ENCOUNTER — Encounter: Payer: Self-pay | Admitting: *Deleted

## 2023-08-14 ENCOUNTER — Other Ambulatory Visit: Payer: Self-pay | Admitting: General Surgery

## 2023-08-14 NOTE — Pre-Procedure Instructions (Signed)
 Surgical Instructions   Your procedure is scheduled on Aug 22, 2023. Report to [facility] Main Entrance "A" at 7:30 A.M., then check in with the Admitting office. Any questions or running late day of surgery: call [phone]  Questions prior to your surgery date: call [phone], Monday-Friday, 8am-4pm. If you experience any cold or flu symptoms such as cough, fever, chills, shortness of breath, etc. between now and your scheduled surgery, please notify us  at the above number.     Remember:  Do not eat after midnight the night before your surgery   You may drink clear liquids until 6:30 AM the morning of your surgery.   Clear liquids allowed are: Water, Non-Citrus Juices (without pulp), Carbonated Beverages, Clear Tea (no milk, honey, etc.), Black Coffee Only (NO MILK, CREAM OR POWDERED CREAMER of any kind), and Gatorade.    Take these medicines the morning of surgery with A SIP OF WATER: atorvastatin  (LIPITOR)  levothyroxine  (SYNTHROID )    May take these medicines IF NEEDED: clorazepate (TRANXENE)    One week prior to surgery, STOP taking any Aspirin (unless otherwise instructed by your surgeon) Aleve, Naproxen, Ibuprofen , Motrin , Advil , Goody's, BC's, all herbal medications, fish oil, and non-prescription vitamins.                     Do NOT Smoke (Tobacco/Vaping) for 24 hours prior to your procedure.  If you use a CPAP at night, you may bring your mask/headgear for your overnight stay.   You will be asked to remove any contacts, glasses, piercing's, hearing aid's, dentures/partials prior to surgery. Please bring cases for these items if needed.    Patients discharged the day of surgery will not be allowed to drive home, and someone needs to stay with them for 24 hours.  SURGICAL WAITING ROOM VISITATION Patients may have no more than 2 support people in the waiting area - these visitors may rotate.   Pre-op nurse will coordinate an appropriate time for 1 ADULT support  person, who may not rotate, to accompany patient in pre-op.  Children under the age of 45 must have an adult with them who is not the patient and must remain in the main waiting area with an adult.  If the patient needs to stay at the hospital during part of their recovery, the visitor guidelines for inpatient rooms apply.  Please refer to the [facility] website for the visitor guidelines for any additional information.   If you received a COVID test during your pre-op visit  it is requested that you wear a mask when out in public, stay away from anyone that may not be feeling well and notify your surgeon if you develop symptoms. If you have been in contact with anyone that has tested positive in the last 10 days please notify you surgeon.      Pre-operative CHG Bathing Instructions   You can play a key role in reducing the risk of infection after surgery. Your skin needs to be as free of germs as possible. You can reduce the number of germs on your skin by washing with CHG (chlorhexidine  gluconate) soap before surgery. CHG is an antiseptic soap that kills germs and continues to kill germs even after washing.   DO NOT use if you have an allergy to chlorhexidine /CHG or antibacterial soaps. If your skin becomes reddened or irritated, stop using the CHG and notify one of our RNs at [phone].  TAKE A SHOWER THE NIGHT BEFORE SURGERY AND THE DAY OF SURGERY    Please keep in mind the following:  DO NOT shave, including legs and underarms, 48 hours prior to surgery.   You may shave your face before/day of surgery.  Place clean sheets on your bed the night before surgery Use a clean washcloth (not used since being washed) for each shower. DO NOT sleep with pet's night before surgery.  CHG Shower Instructions:  Wash your face and private area with normal soap. If you choose to wash your hair, wash first with your normal shampoo.  After you use shampoo/soap, rinse your hair and  body thoroughly to remove shampoo/soap residue.  Turn the water OFF and apply half the bottle of CHG soap to a CLEAN washcloth.  Apply CHG soap ONLY FROM YOUR NECK DOWN TO YOUR TOES (washing for 3-5 minutes)  DO NOT use CHG soap on face, private areas, open wounds, or sores.  Pay special attention to the area where your surgery is being performed.  If you are having back surgery, having someone wash your back for you may be helpful. Wait 2 minutes after CHG soap is applied, then you may rinse off the CHG soap.  Pat dry with a clean towel  Put on clean pajamas    Additional instructions for the day of surgery: DO NOT APPLY any lotions, deodorants, cologne, or perfumes.   Do not wear jewelry or makeup Do not wear nail polish, gel polish, artificial nails, or any other type of covering on natural nails (fingers and toes) Do not bring valuables to the hospital. [facility] is not responsible for valuables/personal belongings. Put on clean/comfortable clothes.  Please brush your teeth.  Ask your nurse before applying any prescription medications to the skin.

## 2023-08-15 ENCOUNTER — Encounter (HOSPITAL_COMMUNITY)
Admission: RE | Admit: 2023-08-15 | Discharge: 2023-08-15 | Disposition: A | Discharge status: Home / Self Care | Source: Ambulatory Visit | Attending: General Surgery | Admitting: General Surgery

## 2023-08-15 ENCOUNTER — Other Ambulatory Visit: Payer: Self-pay

## 2023-08-15 ENCOUNTER — Encounter (HOSPITAL_COMMUNITY): Payer: Self-pay

## 2023-08-15 VITALS — BP 128/56 | HR 58 | Temp 97.9°F | Resp 18 | Ht 64.0 in | Wt 146.0 lb

## 2023-08-15 DIAGNOSIS — Z01818 Encounter for other preprocedural examination: Secondary | ICD-10-CM | POA: Diagnosis present

## 2023-08-15 LAB — BASIC METABOLIC PANEL WITH GFR
Anion gap: 7 (ref 5–15)
BUN: 11 mg/dL (ref 8–23)
CO2: 27 mmol/L (ref 22–32)
Calcium: 9.7 mg/dL (ref 8.9–10.3)
Chloride: 105 mmol/L (ref 98–111)
Creatinine, Ser: 0.95 mg/dL (ref 0.44–1.00)
GFR, Estimated: >60 mL/min (ref >60)
Glucose, Bld: 111 mg/dL — ABNORMAL HIGH (ref 70–99)
Potassium: 4.6 mmol/L (ref 3.5–5.1)
Sodium: 139 mmol/L (ref 135–145)

## 2023-08-15 LAB — CBC
HCT: 40.2 % (ref 36.0–46.0)
Hemoglobin: 13.3 g/dL (ref 12.0–15.0)
MCH: 29 pg (ref 26.0–34.0)
MCHC: 33.1 g/dL (ref 30.0–36.0)
MCV: 87.6 fL (ref 80.0–100.0)
Platelets: 233 10*3/uL (ref 150–400)
RBC: 4.59 MIL/uL (ref 3.87–5.11)
RDW: 12.2 % (ref 11.5–15.5)
WBC: 6.5 10*3/uL (ref 4.0–10.5)
nRBC: 0 % (ref 0.0–0.2)

## 2023-08-15 NOTE — Progress Notes (Addendum)
 PCP - Dr. Oma Bias Cardiologist - Denies  PPM/ICD - Denies Device Orders - n/a Rep Notified - n/a  Chest x-ray - Denies - Recent CT chest 08/01/2023 EKG - Per pt, many years ago Stress Test - Denies ECHO - Denies Cardiac Cath - Denies  Sleep Study - Denies CPAP - n/a  No DM  Last dose of GLP1 agonist- n/a GLP1 instructions: n/a  Blood Thinner Instructions: n/a Aspirin Instructions: n/a  ERAS Protcol - Clear liquids until 0630 morning of surgery PRE-SURGERY Ensure or G2- n/a  COVID TEST- n/a   Anesthesia review: No.   Patient denies shortness of breath, fever, cough and chest pain at PAT appointment. Pt denies any respiratory illness/infection in the last two months.   All instructions explained to the patient and patients husband, Ed, with a verbal understanding of the material. Patient agrees to go over the instructions while at home for a better understanding. Patient also instructed to self quarantine after being tested for COVID-19. The opportunity to ask questions was provided.  CHG soap contraindicated for patient due to allergy. Pt instructed to use Dial Soap for her showers the night before surgery and the morning of surgery. All questions answered.

## 2023-08-21 NOTE — H&P (Signed)
 68 year old female who is BRCA2 positive. She found this out in 2008. At that time she underwent a left lumpectomy, sentinel node biopsy followed by radiotherapy and an antiestrogen for 5 years. She did well until 2020 when she had a right breast cancer that she underwent lumpectomy and radiation for. She had some exemestane  afterwards. She was doing well. She remains active. She has had a total abdominal hysterectomy and oophorectomy. She is retired. She was being followed and had an MRI with an abnormality and had short-term follow-up. This ended up getting a biopsy on a 2 cm area but on ultrasound this area was found to be 8 mm in size. She has C density breast tissue. This was biopsied and ends up being a triple negative invasive ductal carcinoma. She is here to discuss her options. She is planned for chemotherapy with oncology.  Review of Systems: A complete review of systems was obtained from the patient. I have reviewed this information and discussed as appropriate with the patient. See HPI as well for other ROS.  Review of Systems  All other systems reviewed and are negative.   Medical History: Past Medical History:  Diagnosis Date  History of cancer  Thyroid  disease   Patient Active Problem List  Diagnosis  BRCA2 positive  Mass of upper outer quadrant of left breast  Malignant neoplasm of upper-outer quadrant of left breast in female, estrogen receptor negative (CMS/HHS-HCC)   Past Surgical History:  Procedure Laterality Date  HYSTERECTOMY  MASTECTOMY PARTIAL / LUMPECTOMY    Allergies  Allergen Reactions  Adhesive Rash  Chlorhexidine  Gluconate Itching  Skin tears  Latex Rash and Swelling  latex  Cephalexin Other (See Comments)  Other reaction(s): Other (See Comments)  Nitrofurantoin Macrocrystal Hives  Povidone-Iodine  Other (See Comments)  Betadine   Ampicillin Hives and Rash  Has patient had a PCN reaction causing immediate rash, facial/tongue/throat swelling, SOB  or lightheadedness with hypotension: No Has patient had a PCN reaction causing severe rash involving mucus membranes or skin necrosis: No Has patient had a PCN reaction that required hospitalization No Has patient had a PCN reaction occurring within the last 10 years: No If all of the above answers are "NO", then may proceed with Cephalosporin use.  Nitrofurantoin Rash   Current Outpatient Medications on File Prior to Visit  Medication Sig Dispense Refill  atorvastatin  (LIPITOR) 10 MG tablet  biotin 1 mg Cap Take by mouth  cholecalciferol (VITAMIN D3) 2,000 unit tablet Take 50 mcg by mouth  clobetasoL (TEMOVATE) 0.05 % ointment APPLY THIN LAYER TOPICALLY TO THE AFFECTED AREA TWICE DAILY  clorazepate (TRANXENE) 15 MG tablet TAKE 1/2 TABLET BY MOUTH EVERY MORNING AND 1 TABLET AT BEDTIME  DOCOSAHEXAENOIC ACID ORAL Take 2 g by mouth once daily  levothyroxine  (SYNTHROID ) 125 MCG tablet TAKE 1 TABLET BY MOUTH 5 TIMES PER WEEK  omega-3 fatty acids 1,000 mg capsule Take by mouth  traZODone (DESYREL) 100 MG tablet  tretinoin (RETIN-A) 0.1 % cream APPLY 1 APPLICATION TO FACE EXTERNALLY IN THE EVENING ONCE A DAY FOR 30 DAYS   Family History  Problem Relation Age of Onset  Skin cancer Mother  Hyperlipidemia (Elevated cholesterol) Mother  Breast cancer Mother  Skin cancer Father  Coronary Artery Disease (Blocked arteries around heart) Father    Social History   Tobacco Use  Smoking Status Former  Types: Cigarettes  Smokeless Tobacco Never  Marital status: Married  Tobacco Use  Smoking status: Former  Types: Cigarettes  Smokeless tobacco: Never  Vaping  Use  Vaping status: Never Used  Substance and Sexual Activity  Alcohol use: Yes  Drug use: Never    Objective:   Vitals:  08/02/23 1432  BP: 138/79  Pulse: 77  Temp: 36.7 C (98 F)  SpO2: 96%  Weight: 65.3 kg (144 lb)  Height: 162.6 cm (5\' 4" )   Body mass index is 24.72 kg/m.  Physical Exam Vitals reviewed.   Constitutional:  Appearance: Normal appearance.  Chest:  Breasts: Right: No inverted nipple, mass or nipple discharge.  Left: No inverted nipple, mass or nipple discharge.  Lymphadenopathy:  Upper Body:  Right upper body: No supraclavicular or axillary adenopathy.  Left upper body: No supraclavicular or axillary adenopathy.  Neurological:  Mental Status: She is alert.     Assessment and Plan:   Recurrent left breast cancer  Double mastectomies with left axillary sentinel lymph node biopsy, port placement  I discussed this recommendation with her today and she was agreeable to that. I think with the prior cancer and radiation as well as the BRCA mutation this is reasonable at age 68.  We discussed reconstruction and what would be involved with this. She is going to see plastic surgery but I think in the end the best plan would be to do a delayed reconstruction if she does still desires it.  We discussed double mastectomy. We discussed risks as well as the recovery associated with this. We discussed an overnight stay as well as drain placement. We discussed physical therapy postoperatively. We did also discussed a attempt at a repeat sentinel lymph node biopsy as well as port placement. I am going to work on scheduling her soon.

## 2023-08-22 ENCOUNTER — Observation Stay (HOSPITAL_COMMUNITY)
Admission: RE | Admit: 2023-08-22 | Discharge: 2023-08-23 | Disposition: A | Discharge status: Home / Self Care | Attending: General Surgery | Admitting: General Surgery

## 2023-08-22 ENCOUNTER — Ambulatory Visit (HOSPITAL_COMMUNITY): Admitting: Anesthesiology

## 2023-08-22 ENCOUNTER — Encounter (HOSPITAL_COMMUNITY): Payer: Self-pay | Admitting: General Surgery

## 2023-08-22 ENCOUNTER — Encounter (HOSPITAL_COMMUNITY)
Admission: RE | Disposition: A | Discharge status: Home / Self Care | Payer: Self-pay | Source: Home / Self Care | Attending: General Surgery

## 2023-08-22 ENCOUNTER — Encounter: Payer: Self-pay | Admitting: *Deleted

## 2023-08-22 ENCOUNTER — Other Ambulatory Visit: Payer: Self-pay

## 2023-08-22 ENCOUNTER — Ambulatory Visit (HOSPITAL_COMMUNITY)

## 2023-08-22 DIAGNOSIS — D6189 Other specified aplastic anemias and other bone marrow failure syndromes: Secondary | ICD-10-CM | POA: Insufficient documentation

## 2023-08-22 DIAGNOSIS — Z171 Estrogen receptor negative status [ER-]: Secondary | ICD-10-CM | POA: Insufficient documentation

## 2023-08-22 DIAGNOSIS — C50412 Malignant neoplasm of upper-outer quadrant of left female breast: Secondary | ICD-10-CM

## 2023-08-22 DIAGNOSIS — Z9013 Acquired absence of bilateral breasts and nipples: Principal | ICD-10-CM

## 2023-08-22 DIAGNOSIS — Z87891 Personal history of nicotine dependence: Secondary | ICD-10-CM | POA: Diagnosis not present

## 2023-08-22 DIAGNOSIS — Z91040 Latex allergy status: Secondary | ICD-10-CM | POA: Insufficient documentation

## 2023-08-22 DIAGNOSIS — Z79899 Other long term (current) drug therapy: Secondary | ICD-10-CM | POA: Diagnosis not present

## 2023-08-22 DIAGNOSIS — Z1501 Genetic susceptibility to malignant neoplasm of breast: Secondary | ICD-10-CM | POA: Diagnosis not present

## 2023-08-22 HISTORY — PX: MASTECTOMY W/ SENTINEL NODE BIOPSY: SHX2001

## 2023-08-22 HISTORY — PX: PORTACATH PLACEMENT: SHX2246

## 2023-08-22 HISTORY — PX: TOTAL MASTECTOMY: SHX6129

## 2023-08-22 SURGERY — MASTECTOMY WITH SENTINEL LYMPH NODE BIOPSY
Anesthesia: General | Site: Breast | Laterality: Right

## 2023-08-22 MED ORDER — TRANEXAMIC ACID 1000 MG/10ML IV SOLN
2000.0000 mg | Freq: Once | INTRAVENOUS | Status: AC
  Administered 2023-08-22: 2000 mg via TOPICAL
  Filled 2023-08-22: qty 20

## 2023-08-22 MED ORDER — HYDROMORPHONE HCL 1 MG/ML IJ SOLN
0.2500 mg | INTRAMUSCULAR | Status: DC | PRN
Start: 2023-08-22 — End: 2023-08-22
  Administered 2023-08-22 (×2): 0.5 mg via INTRAVENOUS

## 2023-08-22 MED ORDER — ACETAMINOPHEN 500 MG PO TABS
1000.0000 mg | ORAL_TABLET | Freq: Four times a day (QID) | ORAL | Status: DC
  Administered 2023-08-22 – 2023-08-23 (×3): 1000 mg via ORAL
  Filled 2023-08-22 (×3): qty 2

## 2023-08-22 MED ORDER — ACETAMINOPHEN 500 MG PO TABS
1000.0000 mg | ORAL_TABLET | Freq: Four times a day (QID) | ORAL | Status: DC
  Administered 2023-08-22: 1000 mg via ORAL
  Filled 2023-08-22: qty 2

## 2023-08-22 MED ORDER — LEVOTHYROXINE SODIUM 25 MCG PO TABS
125.0000 ug | ORAL_TABLET | ORAL | Status: DC
  Administered 2023-08-23: 125 ug via ORAL
  Filled 2023-08-22: qty 1

## 2023-08-22 MED ORDER — BUPIVACAINE HCL (PF) 0.25 % IJ SOLN
INTRAMUSCULAR | Status: DC | PRN
  Administered 2023-08-22 (×2): 10 mL via PERINEURAL

## 2023-08-22 MED ORDER — BUPIVACAINE HCL 0.25 % IJ SOLN
INTRAMUSCULAR | Status: DC | PRN
  Administered 2023-08-22: 4 mL

## 2023-08-22 MED ORDER — MIDAZOLAM HCL 5 MG/5ML IJ SOLN
INTRAMUSCULAR | Status: DC | PRN
  Administered 2023-08-22: 1 mg via INTRAVENOUS

## 2023-08-22 MED ORDER — 0.9 % SODIUM CHLORIDE (POUR BTL) OPTIME
TOPICAL | Status: DC | PRN
  Administered 2023-08-22: 1000 mL

## 2023-08-22 MED ORDER — DEXAMETHASONE SODIUM PHOSPHATE 4 MG/ML IJ SOLN
INTRAMUSCULAR | Status: DC | PRN
Start: 2023-08-22 — End: 2023-08-22
  Administered 2023-08-22 (×2): 3 mg via PERINEURAL

## 2023-08-22 MED ORDER — FENTANYL CITRATE (PF) 250 MCG/5ML IJ SOLN
INTRAMUSCULAR | Status: DC | PRN
  Administered 2023-08-22: 25 ug via INTRAVENOUS
  Administered 2023-08-22 (×2): 50 ug via INTRAVENOUS

## 2023-08-22 MED ORDER — BUPIVACAINE HCL (PF) 0.5 % IJ SOLN: INTRAMUSCULAR | Status: DC | PRN

## 2023-08-22 MED ORDER — LACTATED RINGERS IV SOLN: INTRAVENOUS | Status: DC

## 2023-08-22 MED ORDER — MAGTRACE LYMPHATIC TRACER
INTRAMUSCULAR | Status: DC | PRN
  Administered 2023-08-22: 2 mL via INTRAMUSCULAR

## 2023-08-22 MED ORDER — FENTANYL CITRATE (PF) 250 MCG/5ML IJ SOLN: INTRAMUSCULAR | Status: DC | PRN

## 2023-08-22 MED ORDER — SUGAMMADEX SODIUM 200 MG/2ML IV SOLN
INTRAVENOUS | Status: DC | PRN
  Administered 2023-08-22: 150 mg via INTRAVENOUS

## 2023-08-22 MED ORDER — ONDANSETRON 4 MG PO TBDP
4.0000 mg | ORAL_TABLET | Freq: Four times a day (QID) | ORAL | Status: DC | PRN
  Administered 2023-08-22: 4 mg via ORAL
  Filled 2023-08-22: qty 1

## 2023-08-22 MED ORDER — CLONIDINE HCL (ANALGESIA) 100 MCG/ML EP SOLN
EPIDURAL | Status: DC | PRN
  Administered 2023-08-22 (×2): 4 ug

## 2023-08-22 MED ORDER — HEPARIN 6000 UNIT IRRIGATION SOLUTION
Status: AC
  Filled 2023-08-22: qty 500

## 2023-08-22 MED ORDER — LIDOCAINE HCL (PF) 2 % IJ SOLN: INTRAMUSCULAR | Status: DC | PRN

## 2023-08-22 MED ORDER — TRAZODONE HCL 100 MG PO TABS
200.0000 mg | ORAL_TABLET | Freq: Every day | ORAL | Status: DC
  Administered 2023-08-22: 200 mg via ORAL
  Filled 2023-08-22: qty 2

## 2023-08-22 MED ORDER — METHOCARBAMOL 1000 MG/10ML IJ SOLN: 500.0000 mg | Freq: Three times a day (TID) | INTRAMUSCULAR | Status: DC | PRN

## 2023-08-22 MED ORDER — HEPARIN SOD (PORK) LOCK FLUSH 100 UNIT/ML IV SOLN
INTRAVENOUS | Status: AC
  Filled 2023-08-22: qty 5

## 2023-08-22 MED ORDER — BUPIVACAINE HCL (PF) 0.25 % IJ SOLN
INTRAMUSCULAR | Status: AC
  Filled 2023-08-22: qty 30

## 2023-08-22 MED ORDER — CHLORHEXIDINE GLUCONATE 0.12 % MT SOLN
15.0000 mL | Freq: Once | OROMUCOSAL | Status: DC
Start: 2023-08-22 — End: 2023-08-22

## 2023-08-22 MED ORDER — PHENYLEPHRINE 80 MCG/ML (10ML) SYRINGE FOR IV PUSH (FOR BLOOD PRESSURE SUPPORT)
PREFILLED_SYRINGE | INTRAVENOUS | Status: DC | PRN
  Administered 2023-08-22: 80 ug via INTRAVENOUS
  Administered 2023-08-22: 40 ug via INTRAVENOUS

## 2023-08-22 MED ORDER — METHOCARBAMOL 500 MG PO TABS: 500.0000 mg | ORAL_TABLET | Freq: Three times a day (TID) | ORAL | Status: DC | PRN

## 2023-08-22 MED ORDER — FENTANYL CITRATE (PF) 250 MCG/5ML IJ SOLN
INTRAMUSCULAR | Status: AC
  Filled 2023-08-22: qty 5

## 2023-08-22 MED ORDER — SODIUM CHLORIDE 0.9 % IV SOLN: INTRAVENOUS | Status: DC

## 2023-08-22 MED ORDER — METHOCARBAMOL 500 MG PO TABS
500.0000 mg | ORAL_TABLET | Freq: Three times a day (TID) | ORAL | 0 refills | Status: AC | PRN
Start: 2023-08-22 — End: ?

## 2023-08-22 MED ORDER — FENTANYL CITRATE PF 50 MCG/ML IJ SOSY
50.0000 ug | PREFILLED_SYRINGE | Freq: Once | INTRAMUSCULAR | Status: AC
  Administered 2023-08-22: 50 ug via INTRAVENOUS
  Filled 2023-08-22: qty 1

## 2023-08-22 MED ORDER — LEVOTHYROXINE SODIUM 25 MCG PO TABS
125.0000 ug | ORAL_TABLET | Freq: Once | ORAL | Status: AC
  Administered 2023-08-22: 125 ug via ORAL
  Filled 2023-08-22: qty 1

## 2023-08-22 MED ORDER — ACETAMINOPHEN 500 MG PO TABS
1000.0000 mg | ORAL_TABLET | ORAL | Status: AC
  Administered 2023-08-22: 1000 mg via ORAL
  Filled 2023-08-22: qty 2

## 2023-08-22 MED ORDER — CEFAZOLIN SODIUM-DEXTROSE 2-4 GM/100ML-% IV SOLN
2.0000 g | INTRAVENOUS | Status: AC
  Administered 2023-08-22: 2 g via INTRAVENOUS
  Filled 2023-08-22: qty 100

## 2023-08-22 MED ORDER — HYDROMORPHONE HCL 1 MG/ML IJ SOLN
INTRAMUSCULAR | Status: AC
  Filled 2023-08-22: qty 1

## 2023-08-22 MED ORDER — LEVOTHYROXINE SODIUM 25 MCG PO TABS: 125.0000 ug | ORAL_TABLET | ORAL | Status: DC

## 2023-08-22 MED ORDER — SIMETHICONE 80 MG PO CHEW: 40.0000 mg | CHEWABLE_TABLET | Freq: Four times a day (QID) | ORAL | Status: DC | PRN

## 2023-08-22 MED ORDER — PROPOFOL 1000 MG/100ML IV EMUL
INTRAVENOUS | Status: AC
  Filled 2023-08-22: qty 300

## 2023-08-22 MED ORDER — HEMOSTATIC AGENTS (NO CHARGE) OPTIME
TOPICAL | Status: DC | PRN
  Administered 2023-08-22: 1 via TOPICAL

## 2023-08-22 MED ORDER — OXYCODONE HCL 5 MG PO TABS: 5.0000 mg | ORAL_TABLET | Freq: Four times a day (QID) | ORAL | 0 refills | Status: DC | PRN

## 2023-08-22 MED ORDER — ONDANSETRON HCL 4 MG/2ML IJ SOLN
INTRAMUSCULAR | Status: DC | PRN
  Administered 2023-08-22: 4 mg via INTRAVENOUS

## 2023-08-22 MED ORDER — HEPARIN SOD (PORK) LOCK FLUSH 100 UNIT/ML IV SOLN
INTRAVENOUS | Status: DC | PRN
  Administered 2023-08-22: 400 [IU] via INTRAVENOUS

## 2023-08-22 MED ORDER — HEPARIN 6000 UNIT IRRIGATION SOLUTION
Status: DC | PRN
  Administered 2023-08-22: 1

## 2023-08-22 MED ORDER — ATORVASTATIN CALCIUM 10 MG PO TABS
5.0000 mg | ORAL_TABLET | Freq: Every day | ORAL | Status: DC
  Administered 2023-08-22 – 2023-08-23 (×2): 5 mg via ORAL
  Filled 2023-08-22 (×2): qty 1

## 2023-08-22 MED ORDER — PROPOFOL 10 MG/ML IV BOLUS
INTRAVENOUS | Status: DC | PRN
  Administered 2023-08-22: 80 ug/kg/min via INTRAVENOUS
  Administered 2023-08-22: 20 mg via INTRAVENOUS
  Administered 2023-08-22: 110 mg via INTRAVENOUS

## 2023-08-22 MED ORDER — MIDAZOLAM HCL 2 MG/2ML IJ SOLN
INTRAMUSCULAR | Status: AC
  Administered 2023-08-22: 1 mg via INTRAVENOUS
  Filled 2023-08-22: qty 2

## 2023-08-22 MED ORDER — ONDANSETRON HCL 4 MG/2ML IJ SOLN
4.0000 mg | Freq: Four times a day (QID) | INTRAMUSCULAR | Status: DC | PRN
Start: 2023-08-22 — End: 2023-08-23
  Administered 2023-08-23: 4 mg via INTRAVENOUS
  Filled 2023-08-22: qty 2

## 2023-08-22 MED ORDER — ROCURONIUM BROMIDE 100 MG/10ML IV SOLN
INTRAVENOUS | Status: DC | PRN
  Administered 2023-08-22: 10 mg via INTRAVENOUS
  Administered 2023-08-22: 20 mg via INTRAVENOUS
  Administered 2023-08-22: 10 mg via INTRAVENOUS
  Administered 2023-08-22: 50 mg via INTRAVENOUS

## 2023-08-22 MED ORDER — BUPIVACAINE HCL (PF) 0.5 % IJ SOLN
INTRAMUSCULAR | Status: DC | PRN
  Administered 2023-08-22 (×2): 10 mL via PERINEURAL

## 2023-08-22 MED ORDER — LIDOCAINE HCL (CARDIAC) PF 100 MG/5ML IV SOSY
PREFILLED_SYRINGE | INTRAVENOUS | Status: DC | PRN
  Administered 2023-08-22: 60 mg via INTRATRACHEAL

## 2023-08-22 MED ORDER — OXYCODONE HCL 5 MG PO TABS: 5.0000 mg | ORAL_TABLET | ORAL | Status: DC | PRN

## 2023-08-22 MED ORDER — FENTANYL CITRATE (PF) 100 MCG/2ML IJ SOLN
INTRAMUSCULAR | Status: AC
  Filled 2023-08-22: qty 2

## 2023-08-22 MED ORDER — MIDAZOLAM HCL 2 MG/2ML IJ SOLN
1.0000 mg | Freq: Once | INTRAMUSCULAR | Status: AC
  Filled 2023-08-22: qty 1

## 2023-08-22 MED ORDER — ORAL CARE MOUTH RINSE
15.0000 mL | Freq: Once | OROMUCOSAL | Status: AC
  Administered 2023-08-22: 15 mL via OROMUCOSAL

## 2023-08-22 MED ORDER — DOCUSATE SODIUM 50 MG PO CAPS
250.0000 mg | ORAL_CAPSULE | Freq: Every day | ORAL | Status: DC
  Administered 2023-08-22 – 2023-08-23 (×2): 250 mg via ORAL
  Filled 2023-08-22 (×2): qty 1

## 2023-08-22 MED ORDER — MIDAZOLAM HCL 2 MG/2ML IJ SOLN
INTRAMUSCULAR | Status: AC
  Filled 2023-08-22: qty 2

## 2023-08-22 MED ORDER — MORPHINE SULFATE (PF) 2 MG/ML IV SOLN
2.0000 mg | INTRAVENOUS | Status: DC | PRN
  Administered 2023-08-22 – 2023-08-23 (×4): 2 mg via INTRAVENOUS
  Filled 2023-08-22 (×4): qty 1

## 2023-08-22 SURGICAL SUPPLY — 71 items
BAG COUNTER SPONGE SURGICOUNT (BAG) ×3 IMPLANT
BAG DECANTER FOR FLEXI CONT (MISCELLANEOUS) ×3 IMPLANT
BINDER BREAST LRG (GAUZE/BANDAGES/DRESSINGS) IMPLANT
BINDER BREAST XLRG (GAUZE/BANDAGES/DRESSINGS) IMPLANT
BIOPATCH RED 1 DISK 7.0 (GAUZE/BANDAGES/DRESSINGS) ×3 IMPLANT
CANISTER SUCTION 3000ML PPV (SUCTIONS) ×3 IMPLANT
CHLORAPREP W/TINT 26 (MISCELLANEOUS) ×3 IMPLANT
CLIP APPLIE 11 MED OPEN (CLIP) IMPLANT
CLIP APPLIE 9.375 MED OPEN (MISCELLANEOUS) ×3 IMPLANT
CNTNR URN SCR LID CUP LEK RST (MISCELLANEOUS) ×3 IMPLANT
COVER PROBE W GEL 5X96 (DRAPES) ×3 IMPLANT
COVER SURGICAL LIGHT HANDLE (MISCELLANEOUS) ×3 IMPLANT
DERMABOND ADVANCED .7 DNX12 (GAUZE/BANDAGES/DRESSINGS) ×3 IMPLANT
DRAIN CHANNEL 19F RND (DRAIN) ×3 IMPLANT
DRAPE C-ARM 42X120 X-RAY (DRAPES) ×3 IMPLANT
DRAPE CHEST BREAST 15X10 FENES (DRAPES) ×3 IMPLANT
DRAPE TOP ARMCOVERS (MISCELLANEOUS) ×3 IMPLANT
DRAPE U-SHAPE 76X120 STRL (DRAPES) ×3 IMPLANT
DRSG TEGADERM 4X4.75 (GAUZE/BANDAGES/DRESSINGS) ×3 IMPLANT
ELECT CAUTERY BLADE 6.4 (BLADE) ×3 IMPLANT
ELECTRODE BLDE 4.0 EZ CLN MEGD (MISCELLANEOUS) ×3 IMPLANT
ELECTRODE REM PT RTRN 9FT ADLT (ELECTROSURGICAL) ×3 IMPLANT
EVACUATOR SILICONE 100CC (DRAIN) ×3 IMPLANT
GAUZE 4X4 16PLY ~~LOC~~+RFID DBL (SPONGE) ×3 IMPLANT
GAUZE PAD ABD 8X10 STRL (GAUZE/BANDAGES/DRESSINGS) ×6 IMPLANT
GAUZE SPONGE 4X4 12PLY STRL (GAUZE/BANDAGES/DRESSINGS) ×3 IMPLANT
GEL ULTRASOUND 20GR AQUASONIC (MISCELLANEOUS) ×3 IMPLANT
GLOVE BIO SURGEON STRL SZ7 (GLOVE) ×3 IMPLANT
GLOVE BIOGEL PI IND STRL 7.5 (GLOVE) ×3 IMPLANT
GOWN STRL REUS W/ TWL LRG LVL3 (GOWN DISPOSABLE) ×9 IMPLANT
INTRODUCER COOK 11FR (CATHETERS) IMPLANT
KIT BASIN OR (CUSTOM PROCEDURE TRAY) ×3 IMPLANT
KIT PORT POWER 8FR ISP CVUE (Port) ×3 IMPLANT
KIT TURNOVER KIT B (KITS) ×3 IMPLANT
MARKER SKIN DUAL TIP RULER LAB (MISCELLANEOUS) ×3 IMPLANT
NDL 18GX1X1/2 (RX/OR ONLY) (NEEDLE) IMPLANT
NDL FILTER BLUNT 18X1 1/2 (NEEDLE) IMPLANT
NDL HYPO 25GX1X1/2 BEV (NEEDLE) IMPLANT
NEEDLE 18GX1X1/2 (RX/OR ONLY) (NEEDLE) IMPLANT
NEEDLE FILTER BLUNT 18X1 1/2 (NEEDLE) IMPLANT
NEEDLE HYPO 25GX1X1/2 BEV (NEEDLE) IMPLANT
NS IRRIG 1000ML POUR BTL (IV SOLUTION) ×3 IMPLANT
PACK GENERAL/GYN (CUSTOM PROCEDURE TRAY) ×3 IMPLANT
PAD ARMBOARD POSITIONER FOAM (MISCELLANEOUS) ×6 IMPLANT
PENCIL BUTTON HOLSTER BLD 10FT (ELECTRODE) ×3 IMPLANT
PENCIL SMOKE EVACUATOR (MISCELLANEOUS) ×3 IMPLANT
PIN SAFETY STERILE (MISCELLANEOUS) ×3 IMPLANT
POSITIONER HEAD DONUT 9IN (MISCELLANEOUS) ×3 IMPLANT
POWDER SURGICEL 3.0 GRAM (HEMOSTASIS) IMPLANT
SET INTRODUCER 12FR PACEMAKER (INTRODUCER) IMPLANT
SET SHEATH INTRODUCER 10FR (MISCELLANEOUS) IMPLANT
SHEATH COOK PEEL AWAY SET 9F (SHEATH) IMPLANT
SPECIMEN JAR X LARGE (MISCELLANEOUS) ×3 IMPLANT
SPIKE FLUID TRANSFER (MISCELLANEOUS) ×3 IMPLANT
STAPLER VISISTAT 35W (STAPLE) ×3 IMPLANT
STRIP CLOSURE SKIN 1/2X4 (GAUZE/BANDAGES/DRESSINGS) ×3 IMPLANT
SUT ETHILON 2 0 FS 18 (SUTURE) ×3 IMPLANT
SUT MNCRL AB 4-0 PS2 18 (SUTURE) ×3 IMPLANT
SUT PROLENE 2 0 SH DA (SUTURE) ×3 IMPLANT
SUT SILK 2 0 SH (SUTURE) ×3 IMPLANT
SUT SILK 2-0 18XBRD TIE 12 (SUTURE) IMPLANT
SUT VIC AB 3-0 54X BRD REEL (SUTURE) ×3 IMPLANT
SUT VIC AB 3-0 SH 18 (SUTURE) ×3 IMPLANT
SUT VIC AB 3-0 SH 27XBRD (SUTURE) ×3 IMPLANT
SUT VIC AB 3-0 SH 8-18 (SUTURE) ×3 IMPLANT
SYR 5ML LUER SLIP (SYRINGE) ×3 IMPLANT
SYR CONTROL 10ML LL (SYRINGE) IMPLANT
TOWEL GREEN STERILE (TOWEL DISPOSABLE) ×3 IMPLANT
TOWEL GREEN STERILE FF (TOWEL DISPOSABLE) ×3 IMPLANT
TRACER MAGTRACE VIAL (MISCELLANEOUS) IMPLANT
TRAY LAPAROSCOPIC MC (CUSTOM PROCEDURE TRAY) ×3 IMPLANT

## 2023-08-22 NOTE — Discharge Instructions (Signed)
 [facility], [place] [phone]  MASTECTOMY: POST OP INSTRUCTIONS Take 400 mg of ibuprofen  every 8 hours or 650 mg tylenol  every 6 hours for next 72 hours then as needed. Use ice several times daily also.   A prescription for pain medication may be given to you upon discharge.  Take your pain medication as prescribed, if needed.  If narcotic pain medicine is not needed, then you may take acetaminophen  (Tylenol ), naprosyn (Alleve) or ibuprofen  (Advil ) as needed. Take your usually prescribed medications unless otherwise directed. If you need a refill on your pain medication, please contact your pharmacy.  They will contact our office to request authorization.  Prescriptions will not be filled after 5pm or on week-ends. You should follow a light diet the first 24 hours after surgery.  Resume your normal diet the day after surgery. Most patients will experience some swelling and bruising on the chest and underarm.  Ice packs will help.  Swelling and bruising can take several days to resolve. Wear the bra for 72 hours day and night. After night please wear during the day.  It is common to experience some constipation if taking pain medication after surgery.  Increasing fluid intake and taking a stool softener (such as Colace) will usually help or prevent this problem from occurring.  A mild laxative (Milk of Magnesia or Miralax) should be taken according to package instructions if there are no bowel movements after 48 hours. There is glue and steristrips on your incision. They will come off in the next few weeks.  You may take a shower 48 hours after surgery.  Any sutures will be removed at an office visit DRAINS:  If you have drains in place, it is important to keep a list of the amount of drainage produced each day in your drains.  Before leaving the hospital, you should be instructed on drain care.  Call our office if you have any questions about your drains. I will remove your drains when  they put out less than 30 cc or ml for 2 consecutive days. ACTIVITIES:  You may resume regular (light) daily activities beginning the next day--such as daily self-care, walking, climbing stairs--gradually increasing activities as tolerated.  You may have sexual intercourse when it is comfortable.  Refrain from any heavy lifting or straining until approved by your doctor. You may drive when you are no longer taking prescription pain medication, you can comfortably wear a seatbelt, and you can safely maneuver your car and apply brakes. RETURN TO WORK:  __________________________________________________________ [name] should see your doctor in the office for a follow-up appointment approximately 2 weeks after your surgery.  Your doctor's nurse will typically make your follow-up appointment when she calls you with your pathology report.  Expect your pathology report 3-4 business days after surgery. OTHER INSTRUCTIONS: ______________________________________________________________________________________________ ____________________________________________________________________________________________ WHEN TO CALL YOUR DR Cristie Mckinney: Fever over 101.0 Nausea and/or vomiting Extreme swelling or bruising Continued bleeding from incision. Increased pain, redness, or drainage from the incision. The clinic staff is available to answer your questions during regular business hours.  Please don't hesitate to call and ask to speak to one of the nurses for clinical concerns.  If you have a medical emergency, go to the nearest emergency room or call 911.  A surgeon from [facility] is always on call at the hospital. [address], [place], [place]  [address] ? [address] [address], [place], [place]   [address] [phone] ? [phone] ? [phone] Web site: [email or web address]

## 2023-08-22 NOTE — Anesthesia Postprocedure Evaluation (Signed)
 Anesthesia Post Note  Patient: [name]  Procedure(s) Performed: MASTECTOMY WITH SENTINEL LYMPH NODE BIOPSY (Left: Breast) MASTECTOMY, SIMPLE (Right) INSERTION, TUNNELED CENTRAL VENOUS DEVICE, WITH PORT     Patient location during evaluation: PACU Anesthesia Type: General Level of consciousness: sedated and patient cooperative Pain management: pain level controlled Vital Signs Assessment: post-procedure vital signs reviewed and stable Respiratory status: spontaneous breathing Cardiovascular status: stable Anesthetic complications: no   No notable events documented.  Last Vitals:  Vitals:   08/22/23 1300 08/22/23 1318  BP: 133/61 125/66  Pulse: (!) 53 (!) 53  Resp: 15 18  Temp: 36.6 C   SpO2: 94% 98%    Last Pain:  Vitals:   08/22/23 1243  TempSrc:   PainSc: 6                  Gorman Laughter

## 2023-08-22 NOTE — Plan of Care (Signed)

## 2023-08-22 NOTE — Anesthesia Preprocedure Evaluation (Signed)
 Anesthesia Evaluation  Patient identified by MRN, date of birth, ID band Patient awake    Reviewed: Allergy & Precautions, NPO status , Patient's Chart, lab work & pertinent test results  Airway Mallampati: II  TM Distance: >3 FB Neck ROM: Full    Dental  (+) Dental Advisory Given, Teeth Intact, Missing   Pulmonary former smoker   Pulmonary exam normal breath sounds clear to auscultation       Cardiovascular negative cardio ROS Normal cardiovascular exam Rhythm:Regular Rate:Normal     Neuro/Psych  PSYCHIATRIC DISORDERS Anxiety     negative neurological ROS     GI/Hepatic negative GI ROS, Neg liver ROS,,,  Endo/Other  Hypothyroidism    Renal/GU negative Renal ROS     Musculoskeletal negative musculoskeletal ROS (+)    Abdominal   Peds  Hematology negative hematology ROS (+)   Anesthesia Other Findings   Reproductive/Obstetrics negative OB ROS                             Anesthesia Physical Anesthesia Plan  ASA: 2  Anesthesia Plan: General   Post-op Pain Management: Tylenol  PO (pre-op)* and Regional block*   Induction: Intravenous  PONV Risk Score and Plan: 3 and Ondansetron , Dexamethasone , Midazolam  and Treatment may vary due to age or medical condition  Airway Management Planned: Oral ETT  Additional Equipment:   Intra-op Plan:   Post-operative Plan: Extubation in OR  Informed Consent: I have reviewed the patients History and Physical, chart, labs and discussed the procedure including the risks, benefits and alternatives for the proposed anesthesia with the patient or authorized representative who has indicated his/her understanding and acceptance.     Dental advisory given  Plan Discussed with: CRNA  Anesthesia Plan Comments:         Anesthesia Quick Evaluation

## 2023-08-22 NOTE — Interval H&P Note (Signed)
 History and Physical Interval Note:  08/22/2023 8:54 AM  [name]  has presented today for surgery, with the diagnosis of LEFT BREAST CANCER, BRCA POSITIVE.  The various methods of treatment have been discussed with the patient and family. After consideration of risks, benefits and other options for treatment, the patient has consented to  Procedure(s) with comments: MASTECTOMY WITH SENTINEL LYMPH NODE BIOPSY (Left) - LMA w/ PEC BLOCK 150 MINUTES LEFT MASTECTOMY LEFT AXILLARY SENTINEL NODE BIOPSY MAGTRACE AND BLUE DYE INJECTION MASTECTOMY, SIMPLE (Right) - RIGHT RISK REDUCING MASTECTOMY INSERTION, TUNNELED CENTRAL VENOUS DEVICE, WITH PORT (N/A) - PORT PLACEMENT WITH ULTRASOUND GUIDANCE as a surgical intervention.  The patient's history has been reviewed, patient examined, no change in status, stable for surgery.  I have reviewed the patient's chart and labs.  Questions were answered to the patient's satisfaction.     Enid Harry

## 2023-08-22 NOTE — Op Note (Signed)
 Preoperative diagnosis: BRCA 2 positive, left breast cancer clinical stage I Postoperative diagnosis: saa Procedure: Right risk-reducing mastectomy   Left mastectomy .  Injection of mag trace for sentinel lymph node identification   Left axillary sentinel lymph node biopsy  Placement of right internal jugular vein port Surgeon: Dr. Donavan Fuchs Anesthesia: General With bilateral pectoral blocks Specimens: 1.  Right breast tissue marked short superior, long lateral 2.  Left breast tissue marked short superior, long lateral 3.  Left deep axillary sentinel lymph nodes Complications: None Drains: 19 French Blake drains to bilateral mastectomy sites Sponge count was correct completion Disposition recovery stable addition  Indications: This is 68 year old female who is BRCA positive.  She has a history of a left and right breast cancer treated with lumpectomy sentinel node biopsy and radiotherapy.  She now had an MRI follow-up for an area and ends up having a 8 mm cancer on ultrasound.  This is a triple negative invasive ductal carcinoma.  We discussed all of her options and due to the [facility] CA positivity we have elected to proceed with double mastectomies and left axillary sentinel lymph node biopsy.  I am going to place a port for oncology as well.  Procedure: After informed consent was obtained she first underwent bilateral pectoral blocks.  She was given antibiotics.  SCDs were in place.  She was then placed under general anesthesia without complication.  She has multiple allergies to prior preps.  We chose to use DuraPrep.  So she was prepped and draped in standard sterile surgical fashion.  Surgical timeout was then performed.  I injected 1.5 cc of mag trace in the upper outer quadrant of the left breast.  I then massaged this.  I first did the right mastectomy.  I made a large elliptical incision to encompass the nipple and areola.  Both sides were difficult due to her prior radiation.  I  then remove the breast tissue after creating flaps of the clavicle, latissimus, parasternal area and the inframammary fold.  I obliterated the inframammary fold to later close this.  This was then passed off the table.  I placed a TXA soaked sponge in the cavity and when I relooked at it it was hemostatic.  Eventually I placed a 27 [place] Blake drain.  This was secured with a 2-0 nylon.  I then closed this with 3-0 Vicryl 4-0 Monocryl.  Steri-Strips and glue were placed.  Due to the prior radiation and the tight skin I did place a single 2-0 nylon vertical mattress suture in the middle.  I then did the left mastectomy in a similar fashion.  The breast was removed and marked as the last 1.  I then entered into the axilla.  There was some very faint activity and I could see a couple of nodes at her prior sentinel node site.  I removed this packet of nodes.  There were no palpable or other abnormal nodes present.  I obtained hemostasis in the axilla.  I then placed a TXA soaked sponge on this side for 5 minutes and then relooked at it.  I obtained hemostasis.  I placed a 69 [place] Blake drain.  I closed this with 3-0 Vicryl and 4-0 Monocryl.  Glue and Steri-Strips were placed.  I placed a 2-0 nylon vertical mattress suture on this side to release some of the tension as well.  I then brought her right arm to her side.  With ultrasound identified a right into internal jugular vein.  I accessed with a needle.  I placed the wire.  The wire was in position with fluoroscopy.  The wire was also in the vein by ultrasound.  I then made an incision below her clavicle and created a pocket for the port.  I then tunneled the line between the 2 sites.  I then placed the dilator over the wire under fluoroscopy.  I remove the wire assembly.  The line was placed through the sheath.  I then removed the peel-away sheath.  I pulled the line back to be in the distal vena cava.  The line is in good position is ready for use.  I attached  to the port.  I placed the port in the pocket and sutured this down with one 2-0 Prolene suture.  The port was functional.  Aspirated blood and flushed.  I packed heparin ended.  I then closed this with 3-0 Vicryl and 4-0 Monocryl.  A Steri-Strip was placed.  She tolerated Carlon Chester well was extubated and transferred to recovery stable.

## 2023-08-22 NOTE — Anesthesia Procedure Notes (Addendum)
 Procedure Name: Intubation Date/Time: 08/22/2023 9:47 AM  Performed by: Ralston Burkes, CRNAPre-anesthesia Checklist: Patient identified, Emergency Drugs available, Suction available, Patient being monitored and Timeout performed Patient Re-evaluated:Patient Re-evaluated prior to induction Oxygen Delivery Method: Circle system utilized Preoxygenation: Pre-oxygenation with 100% oxygen Induction Type: IV induction Ventilation: Mask ventilation without difficulty Laryngoscope Size: Miller and 2 Grade View: Grade I Tube type: Oral Tube size: 7.0 mm Number of attempts: 1 Airway Equipment and Method: Stylet Placement Confirmation: ETT inserted through vocal cords under direct vision, breath sounds checked- equal and bilateral and positive ETCO2 Secured at: 22 cm Tube secured with: Tape

## 2023-08-22 NOTE — Anesthesia Procedure Notes (Signed)
 Anesthesia Regional Block: Pectoralis block   Pre-Anesthetic Checklist: , timeout performed,  Correct Patient, Correct Site, Correct Laterality,  Correct Procedure, Correct Position, site marked,  Risks and benefits discussed,  Surgical consent,  Pre-op evaluation,  At surgeon's request and post-op pain management  Laterality: Right and Left  Prep: chloraprep       Needles:   Needle Type: Stimiplex     Needle Length: 9cm      Additional Needles:   Procedures:,,,, ultrasound used (permanent image in chart),,    Narrative:  Start time: 08/22/2023 8:52 AM End time: 08/22/2023 9:12 AM Injection made incrementally with aspirations every 5 mL.  Performed by: Personally  Anesthesiologist: Gorman Laughter, MD  Additional Notes: BP cuff, SpO2 and EKG monitors applied. Sedation begun.  Anesthetic injected incrementally, slowly, and after neg aspirations under direct ultrasound guidance. Good fascial spread noted. Patient tolerated well.

## 2023-08-22 NOTE — Transfer of Care (Signed)
 Immediate Anesthesia Transfer of Care Note  Patient: [name]  Procedure(s) Performed: MASTECTOMY WITH SENTINEL LYMPH NODE BIOPSY (Left: Breast) MASTECTOMY, SIMPLE (Right) INSERTION, TUNNELED CENTRAL VENOUS DEVICE, WITH PORT  Patient Location: PACU  Anesthesia Type:General and GA combined with regional for post-op pain  Level of Consciousness: awake  Airway & Oxygen Therapy: Patient Spontanous Breathing  Post-op Assessment: Report given to RN  Post vital signs: stable  Last Vitals:  Vitals Value Taken Time  BP 130/58 08/22/23 1215  Temp    Pulse 56 08/22/23 1216  Resp 14 08/22/23 1216  SpO2 97 % 08/22/23 1216  Vitals shown include unfiled device data.  Last Pain:  Vitals:   08/22/23 0835  TempSrc:   PainSc: 0-No pain         Complications: No notable events documented.

## 2023-08-23 ENCOUNTER — Encounter (HOSPITAL_COMMUNITY): Payer: Self-pay | Admitting: General Surgery

## 2023-08-23 DIAGNOSIS — C50412 Malignant neoplasm of upper-outer quadrant of left female breast: Secondary | ICD-10-CM | POA: Diagnosis not present

## 2023-08-23 MED ORDER — PROPOFOL 1000 MG/100ML IV EMUL
INTRAVENOUS | Status: AC
  Filled 2023-08-23: qty 300

## 2023-08-23 NOTE — Plan of Care (Signed)
  Problem: Education: Goal: Knowledge of General Education information will improve Description: Including pain rating scale, medication(s)/side effects and non-pharmacologic comfort measures Outcome: Progressing   Problem: Activity: Goal: Risk for activity intolerance will decrease Outcome: Progressing   Problem: Nutrition: Goal: Adequate nutrition will be maintained Outcome: Progressing   Problem: Coping: Goal: Level of anxiety will decrease Outcome: Progressing   Problem: Elimination: Goal: Will not experience complications related to bowel motility Outcome: Progressing   Problem: Pain Managment: Goal: General experience of comfort will improve and/or be controlled Outcome: Progressing

## 2023-08-23 NOTE — Discharge Summary (Signed)
 Physician Discharge Summary  Patient ID: [name] MRN: [id number] DOB/AGE: 1955-08-25 68 y.o.  Admit date: 08/22/2023 Discharge date: 08/23/2023  Admission Diagnoses:breast cancer   Discharge Diagnoses:  Principal Problem:   S/P bilateral mastectomy   Discharged Condition: good  Hospital Course: pt did well Went home POD 1  No immediate complications noted       Treatments: surgery: bilateral mastectomy  port placement   Discharge Exam: Blood pressure (!) 166/64, pulse (!) 51, temperature 98.4 F (36.9 C), temperature source Oral, resp. rate 18, height 5\' 4"  (1.626 m), weight 66.2 kg, SpO2 98%. General appearance: alert and cooperative Resp: clear to auscultation bilaterally Cardio: nsr Incision/Wound:incisions CDI no hematoma flaps viable port site intact   Disposition: Discharge disposition: 01-Home or Self Care       Discharge Instructions     Diet - low sodium heart healthy   Complete by: As directed    Increase activity slowly   Complete by: As directed       Allergies as of 08/23/2023       Reactions   Adhesive [tape] Rash   Chloraprep One Step [chlorhexidine  Gluconate] Itching   Skin tears   Latex Rash   Shellfish Allergy Anaphylaxis   Cephalexin Other (See Comments)   Unknown reaction    Ampicillin Rash   Betadine  [povidone Iodine ] Rash   Macrodantin [nitrofurantoin] Rash        Medication List     TAKE these medications    atorvastatin  10 MG tablet Commonly known as: LIPITOR Take 0.5 tablets (5 mg total) by mouth daily.   clorazepate 15 MG tablet Commonly known as: TRANXENE Take 7.5-15 mg by mouth See admin instructions. Take 15 mg at bedtime, may take a 7.5 mg dose in the morning as needed anxiety   docusate sodium 250 MG capsule Commonly known as: COLACE Take 250 mg by mouth daily.   levothyroxine  125 MCG tablet Commonly known as: SYNTHROID  Take 1 tablet (125 mcg total) by mouth daily. Take 5 days a  week. What  changed: when to take this   Lutein-Zeaxanthin Tabs Take 1 tablet by mouth daily.   methocarbamol  500 MG tablet Commonly known as: ROBAXIN  Take 1 tablet (500 mg total) by mouth every 8 (eight) hours as needed (use for muscle cramps/pain).   oxyCODONE  5 MG immediate release tablet Commonly known as: Oxy IR/ROXICODONE  Take 1 tablet (5 mg total) by mouth every 6 (six) hours as needed.   traZODone 100 MG tablet Commonly known as: DESYREL Take 200 mg by mouth at bedtime.   VITAMIN D  PO Place 1 Dose under the tongue daily. drops        Follow-up Information     Enid Harry, MD Follow up in 2 week(s).   Specialty: General Surgery Contact information: [address] [place] [address] [phone]                 Signed: Rodrigo Clara 08/23/2023, 8:42 AM

## 2023-08-23 NOTE — Progress Notes (Addendum)
 Reached out to covering provider per patient request to speak with them prior to discharge. Provider was able to speak to patient prior to discharge. Discussed drain education with patient and spouse and provided supplies to care for drains at home. Educated on AVS instructions and patient and spouse had no further questions regarding discharge. IV removed. Transportation called for assistance downstairs to exit.   Per Dr. Delane Fear through secure chat, Duraprep was used for this patient, due to patient having existing allergies to Chlorhexidine .

## 2023-08-27 LAB — SURGICAL PATHOLOGY

## 2023-08-28 ENCOUNTER — Encounter: Payer: Self-pay | Admitting: *Deleted

## 2023-08-29 ENCOUNTER — Encounter: Payer: Self-pay | Admitting: *Deleted

## 2023-09-05 ENCOUNTER — Inpatient Hospital Stay: Attending: Hematology | Admitting: Hematology

## 2023-09-05 ENCOUNTER — Encounter: Payer: Self-pay | Admitting: *Deleted

## 2023-09-05 ENCOUNTER — Encounter: Payer: Self-pay | Admitting: Hematology

## 2023-09-05 VITALS — BP 112/62 | HR 55 | Temp 97.0°F | Resp 15 | Ht 64.0 in | Wt 146.1 lb

## 2023-09-05 DIAGNOSIS — C50912 Malignant neoplasm of unspecified site of left female breast: Secondary | ICD-10-CM | POA: Diagnosis not present

## 2023-09-05 DIAGNOSIS — Z9013 Acquired absence of bilateral breasts and nipples: Secondary | ICD-10-CM | POA: Diagnosis not present

## 2023-09-05 DIAGNOSIS — Z5111 Encounter for antineoplastic chemotherapy: Secondary | ICD-10-CM | POA: Insufficient documentation

## 2023-09-05 DIAGNOSIS — Z171 Estrogen receptor negative status [ER-]: Secondary | ICD-10-CM | POA: Insufficient documentation

## 2023-09-05 DIAGNOSIS — L03111 Cellulitis of right axilla: Secondary | ICD-10-CM | POA: Insufficient documentation

## 2023-09-05 DIAGNOSIS — Z923 Personal history of irradiation: Secondary | ICD-10-CM | POA: Insufficient documentation

## 2023-09-05 DIAGNOSIS — Z800 Family history of malignant neoplasm of digestive organs: Secondary | ICD-10-CM | POA: Insufficient documentation

## 2023-09-05 DIAGNOSIS — Z1501 Genetic susceptibility to malignant neoplasm of breast: Secondary | ICD-10-CM | POA: Insufficient documentation

## 2023-09-05 DIAGNOSIS — C50112 Malignant neoplasm of central portion of left female breast: Secondary | ICD-10-CM | POA: Diagnosis not present

## 2023-09-05 DIAGNOSIS — Z1732 Human epidermal growth factor receptor 2 negative status: Secondary | ICD-10-CM | POA: Insufficient documentation

## 2023-09-05 DIAGNOSIS — I509 Heart failure, unspecified: Secondary | ICD-10-CM | POA: Diagnosis not present

## 2023-09-05 DIAGNOSIS — C50111 Malignant neoplasm of central portion of right female breast: Secondary | ICD-10-CM | POA: Insufficient documentation

## 2023-09-05 DIAGNOSIS — M810 Age-related osteoporosis without current pathological fracture: Secondary | ICD-10-CM | POA: Insufficient documentation

## 2023-09-05 DIAGNOSIS — Z1722 Progesterone receptor negative status: Secondary | ICD-10-CM | POA: Diagnosis not present

## 2023-09-05 MED ORDER — LIDOCAINE-PRILOCAINE 2.5-2.5 % EX CREA: TOPICAL_CREAM | CUTANEOUS | 3 refills | Status: DC

## 2023-09-05 MED ORDER — DEXAMETHASONE 4 MG PO TABS: ORAL_TABLET | ORAL | 1 refills | Status: DC

## 2023-09-05 MED ORDER — PROCHLORPERAZINE MALEATE 10 MG PO TABS: 10.0000 mg | ORAL_TABLET | Freq: Four times a day (QID) | ORAL | 1 refills | Status: DC | PRN

## 2023-09-05 MED ORDER — ONDANSETRON HCL 8 MG PO TABS: ORAL_TABLET | ORAL | 1 refills | Status: AC

## 2023-09-05 NOTE — Progress Notes (Signed)
START ON PATHWAY REGIMEN - Breast     Cycles 1 through 4: A cycle is every 14 days:     Doxorubicin      Cyclophosphamide      Pegfilgrastim-xxxx    Cycles 5 through 16: A cycle is every 7 days:     Paclitaxel   **Always confirm dose/schedule in your pharmacy ordering system**  Patient Characteristics: Postoperative without Neoadjuvant Therapy, M0 (Pathologic Staging), Invasive Disease, Adjuvant Therapy, HER2 Negative, ER Negative, Node Negative, pT1a-c, N1mi or pT1c or Higher, pN0 Therapeutic Status: Postoperative without Neoadjuvant Therapy, M0 (Pathologic Staging) AJCC Grade: G3 AJCC N Category: pN0 AJCC M Category: cM0 ER Status: Negative (-) AJCC 8 Stage Grouping: IIA HER2 Status: Negative (-) Oncotype Dx Recurrence Score: Not Appropriate AJCC T Category: pT2 PR Status: Negative (-) Intent of Therapy: Curative Intent, Discussed with Patient 

## 2023-09-05 NOTE — Progress Notes (Signed)
 [facility]   [phone] [phone]   Clinic Follow up Note   Patient Care Team: Sonja Millcreek, MD as PCP - General (Hematology) Alba Ally, MD as Consulting Physician (Psychiatry) Liliane Rei, MD as Consulting Physician (Orthopedic Surgery) Terri Fester, MD as Consulting Physician (Obstetrics and Gynecology) Retta Caster, MD as Consulting Physician (Radiation Oncology) Sonja Lafayette, MD as Consulting Physician (Hematology) [facility] as Consulting Physician (Radiology) Guinevere Lefevre, OD as Referring Physician (Optometry) Enid Harry, MD as Consulting Physician (General Surgery) Auther Bo, RN as Oncology Nurse Navigator Alane Hsu, RN as Oncology Nurse Navigator  Date of Service:  09/05/2023  CHIEF COMPLAINT: f/u of breast cancer  CURRENT THERAPY:  Pending adjuvant chemo  Oncology History   Malignant neoplasm of central portion of left breast in female, estrogen receptor negative (HCC) -cT1bN0M0, stage IB, ER-/PR-/HER2- -breast MRI 09/11/22 detected a non-mass enhancement in L breast but it was felt to be a blood vessel when biopsy was attempted.  A close follow-up breast MRI 12/14/22 showed persistent linear non mass enhancement in the lateral posterior left breast, bx was benign and concordant.  -breast MRI 06/21/2023 showed a 1.0 cm of non mass enhancement/possible enhancing mass with progressive Kinetics which is suspicious for malignancy, biopsy showed grade 2 invasive ductal carcinoma, triple negative.  The area measures 0.8 cm on ultrasound. - She underwent a bilateral mastectomy in the left lymph node dissection on Aug 22, 2023. -Surgical path showed 2.8 cm triple negative left breast adeno carcinoma, negative margins, or 6 lymph nodes were negative. -I recommend adjuvant chemotherapy Assessment & Plan Breast cancer, stage 2, triple negative Stage 2 triple negative breast cancer, grade 3, with  a 2.8 cm tumor in the left breast. Lymph nodes are negative. High KI-67 indicates an aggressive tumor. Post-surgery, the cancer is considered removed, but there is concern for microscopic disease. Adjuvant chemotherapy is recommended to reduce recurrence risk  - I discussed adjuvant chemotherapy regimen Adriamycin and Cytoxan every two weeks for four treatments, followed by Paclitaxel weekly for twelve weeks or every two weeks for four treatments if tolerated.  Alternative regiment of TC was also discussed.  Due to her stage II disease, I recommend AC-T - Order echocardiogram before the first cycle of chemotherapy and after the third cycle to monitor heart function. - Provide premedication with steroids and antiemetics on the day of chemotherapy. - Encourage use of ice packs on hands and feet during Paclitaxel to reduce neuropathy risk by 40%. - Discuss potential side effects of chemotherapy, including fatigue, nausea, diarrhea, neuropathy, hair loss, and long-term risks such as heart failure (10-15% risk) and leukemia (<5% risk). - Offer DigniCap for scalp cooling to reduce hair loss, with a 60% chance of preserving hair, noting it adds two hours to infusion time. - Schedule a chemotherapy class to educate on treatment and side effects. - Prescribe dexamethasone  and antiemetics for use as needed post-chemotherapy. - Perform blood testing every 3-6 months to monitor for recurrence, using Signatera if desired.  BRCA2 mutation BRCA2 mutation positive, contributing to the recurrence and aggressive nature of the breast cancer.  Previous history of breast cancer She has had 3 episodes of breast cancer so far, 2008 (ER positive, left breast), 2020 (invasive, right breast), and 2025 (triple negative, left breast). Previous treatments included tamoxifen , Raloxifene , and Arimidex . No prior chemotherapy.  Plan - Surgical pathology reviewed - She is recovering well from surgery, incision site healing well -  I recommend  adjuvant chemotherapy AC-T, plan to start in 3 to 4 weeks - Patient would like to use Dignicap      SUMMARY OF ONCOLOGIC HISTORY: Oncology History  Breast cancer, BRCA2 positive, left (HCC)  04/15/2017 Initial Diagnosis   Breast cancer, BRCA2 positive, left (HCC)   09/24/2023 -  Chemotherapy   Patient is on Treatment Plan : BREAST DOSE DENSE AC q14d / PACLitaxel q7d     Malignant neoplasm of central portion of right breast in female, estrogen receptor positive (HCC)  09/12/2018 Initial Diagnosis   Ductal carcinoma in situ (DCIS) of right breast   09/17/2018 Cancer Staging   Staging form: Breast, AJCC 8th Edition - Clinical: Stage 0 (cTis (DCIS), cN0, cM0, ER+, PR+, HER2: Not Assessed) - Signed by Percival Brace, NP on 09/17/2018   10/02/2018 Cancer Staging   Staging form: Breast, AJCC 8th Edition - Pathologic stage from 10/02/2018: Stage IA (pT1b, pN0, cM0, G2, ER+, PR+, HER2-) - Signed by Percival Brace, NP on 10/15/2018   Malignant neoplasm of central portion of left breast in female, estrogen receptor negative (HCC)  07/15/2023 Cancer Staging   Staging form: Breast, AJCC 8th Edition - Clinical stage from 07/15/2023: Stage IB (cT1b, cN0, cM0, G2, ER-, PR-, HER2-) - Signed by Sonja Litchfield, MD on 07/17/2023 Stage prefix: Initial diagnosis Histologic grading system: 3 grade system   07/17/2023 Initial Diagnosis   Malignant neoplasm of central portion of left breast in female, estrogen receptor negative (HCC)      Discussed the use of AI scribe software for clinical note transcription with the patient, who gave verbal consent to proceed.  History of Present Illness [name] is a 68 year old female with breast cancer who presents for follow-up after surgery.  She underwent a mastectomy on Aug 22, 2023, for a 2.8 cm tumor in the left breast, with all lymph nodes testing negative. Her current cancer is triple negative and grade three. She has a BRCA2  mutation. She has a history of breast cancer in 2008 and 2020, with the first being ER positive and the second initially thought to be DCIS but later found to be invasive. She has previously taken tamoxifen  and raloxifene .  Post-surgery, she experiences sensitivity to bandages and adhesives, managing the resulting rash with cortisone creams. The drainage tube was removed last Friday, and there is expected soreness at the surgical site. She transitioned from oxycodone  to tramadol for pain management yesterday. She is mobile but avoids strenuous activities to reduce the risk of blood clots. There is improvement in her recovery over the past few days. She is not currently on chemotherapy medications but is taking tramadol for pain.     All other systems were reviewed with the patient and are negative.  MEDICAL HISTORY:  Past Medical History:  Diagnosis Date   Anxiety    Anxiety disorder    BRCA2 positive 07/14/2012   Cancer (HCC) 2008   left breast. Right Breast Cancer in 2020   Dyslipidemia    Fall    Goiter    History of radiation therapy    Hypothyroidism    Osteoporosis     SURGICAL HISTORY: Past Surgical History:  Procedure Laterality Date   ABDOMINAL HYSTERECTOMY  2009   with BSO   BREAST LUMPECTOMY  0981,1914, 1991   BREAST LUMPECTOMY WITH RADIOACTIVE SEED LOCALIZATION Right 10/02/2018   Procedure: RIGHT BREAST LUMPECTOMY WITH RADIOACTIVE SEED LOCALIZATION;  Surgeon: Caralyn Chandler, MD;  Location: [facility];  Service: General;  Laterality: Right;   COLONOSCOPY N/A 12/13/2015   Procedure: COLONOSCOPY;  Surgeon: Garrett Kallman, MD;  Location: [facility];  Service: Endoscopy;  Laterality: N/A;   COLONOSCOPY  2023   FRACTURE SURGERY  2015   left hip   HARDWARE REMOVAL Left 07/08/2015   Procedure: LEFT HIP HARDWARE REMOVAL;  Surgeon: Liliane Rei, MD;  Location: [facility];  Service: Orthopedics;  Laterality: Left;   MASTECTOMY W/ SENTINEL NODE BIOPSY Left  08/22/2023   Procedure: MASTECTOMY WITH SENTINEL LYMPH NODE BIOPSY;  Surgeon: Enid Harry, MD;  Location: [facility];  Service: General;  Laterality: Left;  LMA w/ PEC BLOCK 150 MINUTES LEFT MASTECTOMY LEFT AXILLARY SENTINEL NODE BIOPSY MAGTRACE AND BLUE DYE INJECTION   PORTACATH PLACEMENT N/A 08/22/2023   Procedure: INSERTION, TUNNELED CENTRAL VENOUS DEVICE, WITH PORT;  Surgeon: Enid Harry, MD;  Location: [facility];  Service: General;  Laterality: N/A;  PORT PLACEMENT WITH ULTRASOUND GUIDANCE   TOTAL MASTECTOMY Right 08/22/2023   Procedure: MASTECTOMY, SIMPLE;  Surgeon: Enid Harry, MD;  Location: [facility];  Service: General;  Laterality: Right;  RIGHT RISK REDUCING MASTECTOMY    I have reviewed the social history and family history with the patient and they are unchanged from previous note.  ALLERGIES:  is allergic to adhesive [tape], chloraprep one step [chlorhexidine  gluconate], latex, shellfish allergy, cephalexin, ampicillin, betadine  [povidone iodine ], and macrodantin [nitrofurantoin].  MEDICATIONS:  Current Outpatient Medications  Medication Sig Dispense Refill   atorvastatin  (LIPITOR) 10 MG tablet Take 0.5 tablets (5 mg total) by mouth daily. 90 tablet 1   clorazepate (TRANXENE) 15 MG tablet Take 7.5-15 mg by mouth See admin instructions. Take 15 mg at bedtime, may take a 7.5 mg dose in the morning as needed anxiety     docusate sodium  (COLACE) 250 MG capsule Take 250 mg by mouth daily.     levothyroxine  (SYNTHROID ) 125 MCG tablet Take 1 tablet (125 mcg total) by mouth daily. Take 5 days a  week. (Patient taking differently: Take 125 mcg by mouth every Monday, Tuesday, Wednesday, Thursday, and Friday. Take 5 days a  week.) 90 tablet 1   methocarbamol  (ROBAXIN ) 500 MG tablet Take 1 tablet (500 mg total) by mouth every 8 (eight) hours as needed (use for muscle cramps/pain). 30 tablet 0   traMADol (ULTRAM) 50 MG tablet Take 50 mg by mouth every 6 (six) hours as needed.      traZODone  (DESYREL ) 100 MG tablet Take 200 mg by mouth at bedtime.     VITAMIN D  PO Place 1 Dose under the tongue daily. drops     dexamethasone  (DECADRON ) 4 MG tablet Take 2 tablets (8 mg total) by mouth daily for 3 days. Start the day after doxorubicin/cyclophosphamide chemotherapy. Take with food. 30 tablet 1   lidocaine -prilocaine (EMLA) cream Apply to affected area once 30 g 3   Multiple Vitamins-Minerals (LUTEIN-ZEAXANTHIN) TABS Take 1 tablet by mouth daily. (Patient not taking: Reported on 08/22/2023)     ondansetron  (ZOFRAN ) 8 MG tablet Take 1 tab (8 mg) by mouth every 8 hrs as needed for nausea/vomiting. Start third day after doxorubicin/cyclophosphamide chemotherapy. 30 tablet 1   oxyCODONE  (OXY IR/ROXICODONE ) 5 MG immediate release tablet Take 1 tablet (5 mg total) by mouth every 6 (six) hours as needed. (Patient not taking: Reported on 09/05/2023) 10 tablet 0   prochlorperazine (COMPAZINE) 10 MG tablet Take 1 tablet (10 mg total) by mouth every 6 (six) hours as needed for nausea or vomiting. 30 tablet  1   No current facility-administered medications for this visit.    PHYSICAL EXAMINATION: ECOG PERFORMANCE STATUS: 1 - Symptomatic but completely ambulatory  Vitals:   09/05/23 1505  BP: 112/62  Pulse: (!) 55  Resp: 15  Temp: (!) 97 F (36.1 C)  SpO2: 98%   Wt Readings from Last 3 Encounters:  09/05/23 146 lb 1.6 oz (66.3 kg)  08/22/23 146 lb (66.2 kg)  08/15/23 146 lb (66.2 kg)     GENERAL:alert, no distress and comfortable SKIN: skin color, texture, turgor are normal, no rashes or significant lesions EYES: normal, Conjunctiva are pink and non-injected, sclera clear NECK: supple, thyroid  normal size, non-tender, without nodularity LYMPH:  no palpable lymphadenopathy in the cervical, axillary  LUNGS: clear to auscultation and percussion with normal breathing effort HEART: regular rate & rhythm and no murmurs and no lower extremity edema ABDOMEN:abdomen soft, non-tender and  normal bowel sounds Musculoskeletal:no cyanosis of digits and no clubbing  NEURO: alert & oriented x 3 with fluent speech, no focal motor/sensory deficits BREAST: Status post bilateral mastectomy and port placement.  Breast incision appears okay. Diffuse redness around incisions, and scattered rash in the upper abdomen.  Likely allergy reactions. Physical Exam   LABORATORY DATA:  I have reviewed the data as listed    Latest Ref Rng & Units 08/15/2023   11:30 AM 07/18/2023   10:39 AM 11/19/2022   12:01 PM  CBC  WBC 4.0 - 10.5 K/uL 6.5  4.9  6.0   Hemoglobin 12.0 - 15.0 g/dL 16.1  09.6  04.5   Hematocrit 36.0 - 46.0 % 40.2  39.9  41.6   Platelets 150 - 400 K/uL 233  263  246.0         Latest Ref Rng & Units 08/15/2023   11:30 AM 07/18/2023   10:39 AM 11/19/2022   12:01 PM  CMP  Glucose 70 - 99 mg/dL 409  811  914   BUN 8 - 23 mg/dL 11  12  14    Creatinine 0.44 - 1.00 mg/dL 7.82  9.56  2.13   Sodium 135 - 145 mmol/L 139  139  140   Potassium 3.5 - 5.1 mmol/L 4.6  4.2  4.3   Chloride 98 - 111 mmol/L 105  106  103   CO2 22 - 32 mmol/L 27  29  27    Calcium  8.9 - 10.3 mg/dL 9.7  08.6  57.8   Total Protein 6.5 - 8.1 g/dL  7.7  7.6   Total Bilirubin 0.0 - 1.2 mg/dL  0.6  0.7   Alkaline Phos 38 - 126 U/L  59  64   AST 15 - 41 U/L  19  16   ALT 0 - 44 U/L  17  15       RADIOGRAPHIC STUDIES: I have personally reviewed the radiological images as listed and agreed with the findings in the report. No results found.    Orders Placed This Encounter  Procedures   Consent Attestation for Oncology Treatment    The patient is informed of risks, benefits, side-effects of the prescribed oncology treatment. Potential short term and long term side effects and response rates discussed. After a long discussion, the patient made informed decision to proceed.:   Yes   CBC with Differential (Cancer Center Only)    Standing Status:   Future    Expected Date:   09/24/2023    Expiration Date:    09/23/2024   CMP (Cancer Center only)  Standing Status:   Future    Expected Date:   09/24/2023    Expiration Date:   09/23/2024   CBC with Differential (Cancer Center Only)    Standing Status:   Future    Expected Date:   10/08/2023    Expiration Date:   10/07/2024   CMP (Cancer Center only)    Standing Status:   Future    Expected Date:   10/08/2023    Expiration Date:   10/07/2024   CBC with Differential (Cancer Center Only)    Standing Status:   Future    Expected Date:   10/22/2023    Expiration Date:   10/21/2024   CMP (Cancer Center only)    Standing Status:   Future    Expected Date:   10/22/2023    Expiration Date:   10/21/2024   CBC with Differential (Cancer Center Only)    Standing Status:   Future    Expected Date:   11/05/2023    Expiration Date:   11/04/2024   CMP (Cancer Center only)    Standing Status:   Future    Expected Date:   11/05/2023    Expiration Date:   11/04/2024   PHYSICIAN COMMUNICATION ORDER    A baseline Echo/ Muga should be obtained prior to initiation of Anthracycline Chemotherapy   ECHOCARDIOGRAM COMPLETE    Standing Status:   Future    Expected Date:   09/18/2023    Expiration Date:   09/04/2024    Where should this test be performed:       Perflutren DEFINITY (image enhancing agent) should be administered unless hypersensitivity or allergy exist:   Administer Perflutren    Reason for exam-Echo:   Chemo  Z09   All questions were answered. The patient knows to call the clinic with any problems, questions or concerns. No barriers to learning was detected. The total time spent in the appointment was 40 minutes, including review of chart and various tests results, discussions about plan of care and coordination of care plan     Sonja Cartwright, MD 09/05/2023

## 2023-09-05 NOTE — Assessment & Plan Note (Signed)
-  cT1bN0M0, stage IB, ER-/PR-/HER2- -breast MRI 09/11/22 detected a non-mass enhancement in L breast but it was felt to be a blood vessel when biopsy was attempted.  A close follow-up breast MRI 12/14/22 showed persistent linear non mass enhancement in the lateral posterior left breast, bx was benign and concordant.  -breast MRI 06/21/2023 showed a 1.0 cm of non mass enhancement/possible enhancing mass with progressive Kinetics which is suspicious for malignancy, biopsy showed grade 2 invasive ductal carcinoma, triple negative.  The area measures 0.8 cm on ultrasound. - She underwent a bilateral mastectomy in the left lymph node dissection on Aug 22, 2023. -Surgical path showed 2.8 cm triple negative left breast adeno carcinoma, negative margins, or 6 lymph nodes were negative. -I recommend adjuvant chemotherapy

## 2023-09-06 ENCOUNTER — Encounter: Payer: Self-pay | Admitting: Hematology

## 2023-09-06 ENCOUNTER — Other Ambulatory Visit: Payer: Self-pay

## 2023-09-06 ENCOUNTER — Telehealth: Payer: Self-pay | Admitting: *Deleted

## 2023-09-06 ENCOUNTER — Telehealth: Payer: Self-pay | Admitting: Hematology

## 2023-09-06 NOTE — Telephone Encounter (Signed)
 Reached out to patient regarding dignicap. She has not yet made a decision regarding this. She would like to talk with someone who has used this before and wants to see about the cost.  Informed her I would work on getting her in touch with someone she could talk to.  I have emailed the information and next steps for ordering the cap if she chooses to do so. Patient verbalized understanding.

## 2023-09-09 ENCOUNTER — Encounter: Payer: Self-pay | Admitting: Hematology

## 2023-09-09 ENCOUNTER — Other Ambulatory Visit: Payer: Self-pay

## 2023-09-09 NOTE — Progress Notes (Unsigned)
 [facility]       Telephone: [phone]?Fax: [phone]   Oncology Clinical Pharmacist Practitioner Initial Assessment  [name] is a 68 y.o. female with a diagnosis of breast cancer. They were contacted today via in-person visit.  Indication/Regimen Doxorubicin (Adriamycin) and cyclophosphamide (Cytoxan) followed by paclitaxel (Taxol) are being used appropriately for treatment of breast cancer by Dr. Sonja Patterson.      Wt Readings from Last 1 Encounters:  09/05/23 146 lb 1.6 oz (66.3 kg)    Estimated body surface area is 1.73 meters squared as calculated from the following:   Height as of 09/05/23: 5\' 4"  (1.626 m).   Weight as of 09/05/23: 146 lb 1.6 oz (66.3 kg).  The dosing regimen is every 14 days for 4 cycles  Doxorubicin (60 mg/m2) on Day 1 Cyclophosphamide (600 mg/m2) on Day 1 Pegfilgrastim (6 mg) on Day 3  Followed by a dosing regimen that is every 7 days for 12 cycles  Paclitaxel (80 mg/m2) on Day 1  It is planned to continue until treatment plan completion or unacceptable toxicity. The tentative start date is: 09/23/23  Dose Modifications None at this time   Allergies Allergies  Allergen Reactions   Adhesive [Tape] Rash   Chloraprep One Step [Chlorhexidine  Gluconate] Itching    Skin tears   Latex Rash   Shellfish Allergy Anaphylaxis   Cephalexin Other (See Comments)    Unknown reaction    Ampicillin Rash   Betadine  [Povidone Iodine ] Rash   Macrodantin [Nitrofurantoin] Rash   Vitals    09/05/2023    3:05 PM 08/23/2023    8:19 AM 08/23/2023    3:57 AM  Oncology Vitals  Height 163 cm    Weight 66.271 kg    Weight (lbs) 146 lbs 2 oz    BMI 25.08 kg/m2    Temp 97 F (36.1 C)  98.4 F (36.9 C)  Pulse Rate 55 51 54  BP 112/62 166/64 108/53  Resp 15 18 17   SpO2 98 % 98 % 96 %  BSA (m2) 1.73 m2      Laboratory Data    Latest Ref Rng & Units 08/15/2023   11:30 AM 07/18/2023   10:39 AM 11/19/2022   12:01 PM  CBC EXTENDED  WBC  4.0 - 10.5 K/uL 6.5  4.9  6.0   RBC 3.87 - 5.11 MIL/uL 4.59  4.67  4.82   Hemoglobin 12.0 - 15.0 g/dL 29.5  62.1  30.8   HCT 36.0 - 46.0 % 40.2  39.9  41.6   Platelets 150 - 400 K/uL 233  263  246.0   NEUT# 1.7 - 7.7 K/uL  3.3  3.7   Lymph# 0.7 - 4.0 K/uL  1.1  1.6        Latest Ref Rng & Units 08/15/2023   11:30 AM 07/18/2023   10:39 AM 11/19/2022   12:01 PM  CMP  Glucose 70 - 99 mg/dL 657  846  962   BUN 8 - 23 mg/dL 11  12  14    Creatinine 0.44 - 1.00 mg/dL 9.52  8.41  3.24   Sodium 135 - 145 mmol/L 139  139  140   Potassium 3.5 - 5.1 mmol/L 4.6  4.2  4.3   Chloride 98 - 111 mmol/L 105  106  103   CO2 22 - 32 mmol/L 27  29  27    Calcium  8.9 - 10.3 mg/dL 9.7  40.1  02.7   Total  Protein 6.5 - 8.1 g/dL  7.7  7.6   Total Bilirubin 0.0 - 1.2 mg/dL  0.6  0.7   Alkaline Phos 38 - 126 U/L  59  64   AST 15 - 41 U/L  19  16   ALT 0 - 44 U/L  17  15    Contraindications Contraindications were reviewed? Yes Contraindications to therapy were identified? No   Safety Precautions The following safety precautions were reviewed:  Fever: reviewed the importance of having a thermometer and the Centers for Disease Control and Prevention (CDC) definition of fever which is 100.34F (38C) or higher. Patient should call 24/7 triage at [phone] if experiencing a fever or any other symptoms Decreased white blood cells (WBCs) and increased risk for infection Decreased platelet count and increased risk of bleeding Decreased hemoglobin, part of the red blood cells that carry iron and oxygen Change in the color of urine Fatigue Hair loss Nausea or vomiting Mouth irritation or sores Doxorubicin (vesicant) Cardiotoxicity Hemorrhagic cystitis Secondary cancers Muscle or joint pain or weakness Peripheral neuropathy Hypersensitivity reactions Avoid grapefruit products Intimacy, sexual activity, contraception, fertility Handling body fluids and waste  Medication Reconciliation Current  Outpatient Medications  Medication Sig Dispense Refill   atorvastatin  (LIPITOR) 10 MG tablet Take 0.5 tablets (5 mg total) by mouth daily. 90 tablet 1   clorazepate (TRANXENE) 15 MG tablet Take 7.5-15 mg by mouth See admin instructions. Take 15 mg at bedtime, may take a 7.5 mg dose in the morning as needed anxiety     dexamethasone  (DECADRON ) 4 MG tablet Take 2 tablets (8 mg total) by mouth daily for 3 days. Start the day after doxorubicin/cyclophosphamide chemotherapy. Take with food. 30 tablet 1   docusate sodium  (COLACE) 250 MG capsule Take 250 mg by mouth daily.     levothyroxine  (SYNTHROID ) 125 MCG tablet Take 1 tablet (125 mcg total) by mouth daily. Take 5 days a  week. (Patient taking differently: Take 125 mcg by mouth every Monday, Tuesday, Wednesday, Thursday, and Friday. Take 5 days a  week.) 90 tablet 1   lidocaine -prilocaine  (EMLA ) cream Apply to affected area once 30 g 3   methocarbamol  (ROBAXIN ) 500 MG tablet Take 1 tablet (500 mg total) by mouth every 8 (eight) hours as needed (use for muscle cramps/pain). 30 tablet 0   Multiple Vitamins-Minerals (LUTEIN-ZEAXANTHIN) TABS Take 1 tablet by mouth daily. (Patient not taking: Reported on 08/22/2023)     ondansetron  (ZOFRAN ) 8 MG tablet Take 1 tab (8 mg) by mouth every 8 hrs as needed for nausea/vomiting. Start third day after doxorubicin/cyclophosphamide chemotherapy. 30 tablet 1   oxyCODONE  (OXY IR/ROXICODONE ) 5 MG immediate release tablet Take 1 tablet (5 mg total) by mouth every 6 (six) hours as needed. (Patient not taking: Reported on 09/05/2023) 10 tablet 0   prochlorperazine  (COMPAZINE ) 10 MG tablet Take 1 tablet (10 mg total) by mouth every 6 (six) hours as needed for nausea or vomiting. 30 tablet 1   traMADol (ULTRAM) 50 MG tablet Take 50 mg by mouth every 6 (six) hours as needed.     traZODone  (DESYREL ) 100 MG tablet Take 200 mg by mouth at bedtime.     VITAMIN D  PO Place 1 Dose under the tongue daily. drops     No current  facility-administered medications for this visit.   Medication reconciliation is based on the patient's most recent medication list in the electronic medical record (EMR) including herbal products and OTC medications.   The patient's medication  list was reviewed today with the patient? Yes   Drug-drug interactions (DDIs) DDIs were evaluated? Yes Significant DDIs identified? No   Drug-Food Interactions Drug-food interactions were evaluated? Yes Drug-food interactions identified? No   Follow-up Plan  Treatment start date: 09/23/23 Port placement date: 08/22/23 ECHO date: 09/20/23 We reviewed the prescriptions, premedications, and treatment regimen with the patient. Possible side effects of the treatment regimen were reviewed and management strategies were discussed.  Can use loperamide as needed for diarrhea, loratadine as needed for G-CSF bone pain, and Senna-S as needed for constipation.  *** Clinical pharmacy will assist Dr. Sonja Arkoma and Meline D Reynolds on an as needed basis going forward  Hassie Lint Mandell participated in the discussion, expressed understanding, and voiced agreement with the above plan. All questions were answered to her satisfaction. The patient was advised to contact the clinic at [phone] with any questions or concerns prior to her return visit.   I spent 60 minutes assessing the patient.  Paolo Okane A. Webb Hake, PharmD, BCOP, CPP  Althea Atkinson, RPH-CPP, 09/09/2023 10:50 AM  **Disclaimer: This note was dictated with voice recognition software. Similar sounding words can inadvertently be transcribed and this note may contain transcription errors which may not have been corrected upon publication of note.**

## 2023-09-10 ENCOUNTER — Inpatient Hospital Stay: Admitting: Pharmacist

## 2023-09-10 ENCOUNTER — Inpatient Hospital Stay

## 2023-09-10 DIAGNOSIS — Z171 Estrogen receptor negative status [ER-]: Secondary | ICD-10-CM

## 2023-09-10 DIAGNOSIS — Z5111 Encounter for antineoplastic chemotherapy: Secondary | ICD-10-CM | POA: Diagnosis not present

## 2023-09-10 NOTE — Therapy (Signed)
 OUTPATIENT PHYSICAL THERAPY  UPPER EXTREMITY ONCOLOGY EVALUATION  Patient Name: [name] MRN: [id number] DOB:June 11, 1955, 68 y.o., female Today's Date: 09/11/2023  END OF SESSION:  PT End of Session - 09/11/23 0847     Visit Number 1    Number of Visits 7    Date for PT Re-Evaluation 10/09/23    PT Start Time 0803    PT Stop Time 0845    PT Time Calculation (min) 42 min    Activity Tolerance Patient tolerated treatment well    Behavior During Therapy [facility] for tasks assessed/performed             Past Medical History:  Diagnosis Date   Anxiety    Anxiety disorder    BRCA2 positive 07/14/2012   Cancer (HCC) 2008   left breast. Right Breast Cancer in 2020   Dyslipidemia    Fall    Goiter    History of radiation therapy    Hypothyroidism    Osteoporosis    Past Surgical History:  Procedure Laterality Date   ABDOMINAL HYSTERECTOMY  2009   with BSO   BREAST LUMPECTOMY  4098,1191, 1991   BREAST LUMPECTOMY WITH RADIOACTIVE SEED LOCALIZATION Right 10/02/2018   Procedure: RIGHT BREAST LUMPECTOMY WITH RADIOACTIVE SEED LOCALIZATION;  Surgeon: Caralyn Chandler, MD;  Location: [facility];  Service: General;  Laterality: Right;   COLONOSCOPY N/A 12/13/2015   Procedure: COLONOSCOPY;  Surgeon: Garrett Kallman, MD;  Location: [facility];  Service: Endoscopy;  Laterality: N/A;   COLONOSCOPY  2023   FRACTURE SURGERY  2015   left hip   HARDWARE REMOVAL Left 07/08/2015   Procedure: LEFT HIP HARDWARE REMOVAL;  Surgeon: Liliane Rei, MD;  Location: [facility];  Service: Orthopedics;  Laterality: Left;   MASTECTOMY W/ SENTINEL NODE BIOPSY Left 08/22/2023   Procedure: MASTECTOMY WITH SENTINEL LYMPH NODE BIOPSY;  Surgeon: Enid Harry, MD;  Location: [facility] OR;  Service: General;  Laterality: Left;  LMA w/ PEC BLOCK 150 MINUTES LEFT MASTECTOMY LEFT AXILLARY SENTINEL NODE BIOPSY MAGTRACE AND BLUE DYE INJECTION   PORTACATH PLACEMENT N/A 08/22/2023   Procedure:  INSERTION, TUNNELED CENTRAL VENOUS DEVICE, WITH PORT;  Surgeon: Enid Harry, MD;  Location: [facility] OR;  Service: General;  Laterality: N/A;  PORT PLACEMENT WITH ULTRASOUND GUIDANCE   TOTAL MASTECTOMY Right 08/22/2023   Procedure: MASTECTOMY, SIMPLE;  Surgeon: Enid Harry, MD;  Location: [facility] OR;  Service: General;  Laterality: Right;  RIGHT RISK REDUCING MASTECTOMY   Patient Active Problem List   Diagnosis Date Noted   S/P bilateral mastectomy 08/22/2023   Malignant neoplasm of central portion of left breast in female, estrogen receptor negative (HCC) 07/17/2023   Anxiety 11/19/2022   Osteoporosis - [facility] 11/18/2022   Hypothyroidism 11/18/2022   Hyperlipidemia 11/18/2022   BRCA2 gene mutation positive in female 06/10/2022   Malignant neoplasm of central portion of right breast in female, estrogen receptor positive (HCC) 09/12/2018   Breast cancer, BRCA2 positive, left (HCC) 04/15/2017   Breast cancer screening, high risk patient 04/15/2017   Malignant neoplasm of upper-outer quadrant of left breast in female, estrogen receptor positive (HCC) 01/29/2013     REFERRING PROVIDER: Enid Harry, MD  REFERRING DIAG:  Diagnosis  [phone] (ICD-10-CM) - Postoperative state  C50.412,Z17.1 (ICD-10-CM) - Malignant neoplasm of upper-outer quadrant of left breast in female, estrogen receptor negative (HCC)  Z15.01,Z15.09 (ICD-10-CM) - BRCA2 positive    THERAPY DIAG:  Malignant neoplasm of upper-outer quadrant of left breast in female, estrogen  receptor positive (HCC)  Breast cancer, BRCA2 positive, left (HCC)  S/P bilateral mastectomy  At risk for lymphedema  Aftercare following surgery for neoplasm  ONSET DATE: 06/21/23  Rationale for Evaluation and Treatment: Rehabilitation  SUBJECTIVE:                                                                                                                                                                                            SUBJECTIVE STATEMENT: I am doing okay overall.    PERTINENT HISTORY: Left breast Grade 2 IDC, triple negative +BRCA2.  Bil mastectomy 08/22/23 with 6 negative nodes. Will have chemo AC-P. Hx of 2 other episodes of breast cancer 2008 in left breast and 2020 in Rt breast. Lymph nodes only out on the left, 2 out previously for a total of 8.  Never anything out on the right.  Hx of radiation bilaterally.  Will start chemo in 2 weeks.    PAIN:  Are you having pain? Yes NPRS scale: 4/10 Pain location: across the chest  Pain orientation: Right and Left  PAIN TYPE: aching, burning, and tight Pain description: constant  Aggravating factors: nothing really Relieving factors: oxycodone  and now tramadol and robaxin  and tylenol .    PRECAUTIONS: Left arm lymphedema risk   RED FLAGS: None   WEIGHT BEARING RESTRICTIONS: No  FALLS:  Has patient fallen in last 6 months? No  LIVING ENVIRONMENT: Lives with: lives with their family and lives with their spouse  OCCUPATION: retired   LEISURE: back to tennis, lifting weights, exercising    HAND DOMINANCE: right   PRIOR LEVEL OF FUNCTION: Independent  PATIENT GOALS: get back to normal activity    OBJECTIVE: Note: Objective measures were completed at Evaluation unless otherwise noted.  COGNITION: Overall cognitive status: Within functional limits for tasks assessed   PALPATION: Cording noted in Left axilla and upper arm   OBSERVATIONS / OTHER ASSESSMENTS: wearing a compression bra, incisions covered with steristrips bil with some redness from irritation from prep from surgery.    SENSATION: Some numbness chest and axilla   POSTURE: WNL  UPPER EXTREMITY AROM/PROM:  A/PROM RIGHT   eval   Shoulder extension 65  Shoulder flexion 120  Shoulder abduction 135  Shoulder internal rotation 85  Shoulder external rotation 85    (Blank rows = not tested)  A/PROM LEFT   eval  Shoulder extension 65  Shoulder flexion 122  Shoulder  abduction 122  Shoulder internal rotation 85  Shoulder external rotation 82    (Blank rows = not tested)   UPPER EXTREMITY STRENGTH:   LYMPHEDEMA ASSESSMENTS:   LANDMARK RIGHT  eval  At axilla    15 cm proximal to olecranon process   10 cm proximal to olecranon process 26.9  Olecranon process 24.5  15 cm proximal to ulnar styloid process   10 cm proximal to ulnar styloid process 19.3  Just proximal to ulnar styloid process 15.8  Across hand at thumb web space 19.6  At base of 2nd digit 6.5  (Blank rows = not tested)  LANDMARK LEFT  eval  At axilla    15 cm proximal to olecranon process   10 cm proximal to olecranon process 27  Olecranon process 25  15 cm proximal to ulnar styloid process   10 cm proximal to ulnar styloid process 19.5  Just proximal to ulnar styloid process 16.1  Across hand at thumb web space 19.6  At base of 2nd digit 6.7  (Blank rows = not tested)   L-DEX LYMPHEDEMA SCREENING: The patient was assessed using the L-Dex machine today to produce a lymphedema index baseline score. The patient will be reassessed on a regular basis (typically every 3 months) to obtain new L-Dex scores. If the score is > 6.5 points away from his/her baseline score indicating onset of subclinical lymphedema, it will be recommended to wear a compression garment for 4 weeks, 12 hours per day and then be reassessed. If the score continues to be > 6.5 points from baseline at reassessment, we will initiate lymphedema treatment. Assessing in this manner has a 95% rate of preventing clinically significant lymphedema.  QUICK DASH SURVEY: 54% limited                                                                                                                             TREATMENT DATE:  09/11/23 Eval performed Education on cording Edu and performance of HEP per below.  Completed SOZO and lymphedema education Discussed POC     PATIENT EDUCATION:  Education details: per today's  note Person educated: Patient and Spouse Education method: Explanation, Demonstration, Tactile cues, Verbal cues, and Handouts Education comprehension: verbalized understanding and returned demonstration  HOME EXERCISE PROGRAM: Access Code: [email or web address] URL: [email or web address] Date: 09/11/2023 Prepared by: Judy Null  Exercises - Supine Shoulder Flexion Extension AAROM with Dowel  - 1-2 x daily - 7 x weekly - 10 reps - 5 seconds hold - Supine Chest Stretch with Elbows Bent  - 1 x daily - 7 x weekly - 1 sets - 3 reps - 30-60seconds hold - Seated Scapular Retraction  - 1 x daily - 7 x weekly - 1-3 sets - 10 reps - 2-3 seconds hold - Standing Shoulder Abduction Finger Walk at Wall  - 1 x daily - 7 x weekly - 1-3 sets - 10 reps - 5 seconds hold  ASSESSMENT:  CLINICAL IMPRESSION: Patient is a 68 y.o. female who was seen today for physical therapy evaluation and treatment for her limited AROM and lymphedema risk post bil mastectomy.  She is doing well 3 weeks post operatively  but was never seen pre-operatively for education on stretches, lymphedema, and post op care.  She was educated on a HEP to start, review of lymphedema risk reduction and personal risk of around 10%.  Pt does have some cording and limited ROM and is normally very active with tennis and exercise so we will start PT visits to improve this back to baseline. .    OBJECTIVE IMPAIRMENTS: decreased activity tolerance, decreased knowledge of condition, decreased knowledge of use of DME, decreased mobility, and decreased ROM.   ACTIVITY LIMITATIONS: carrying, lifting, and reach over head  PARTICIPATION LIMITATIONS: community activity and yard work  PERSONAL FACTORS: 1-2 comorbidities: previous hx of radiation and SLNB are also affecting patient's functional outcome.   REHAB POTENTIAL: Excellent  CLINICAL DECISION MAKING: Stable/uncomplicated  EVALUATION COMPLEXITY: Low  GOALS: Goals reviewed with patient?  Yes  SHORT TERM GOALS: Target date: 09/11/23   Pt will be educated on initial HEP to start for post-op ROM improvements Baseline: Goal status: MET   LONG TERM GOALS: Target date: 10/09/23  Pt will improve bil AROM to flexion and abduction of at least 160 to return to full reach Baseline:  Goal status: INITIAL  2.  Pt will be educated on lymphedema risk reduction and will have watched the ABC video Baseline:  Goal status: INITIAL  3.  Pt will be educated on exercises to continue during chemotherapy to minimize muscle loss Baseline:  Goal status: INITIAL   PLAN:  PT FREQUENCY: 2x/week  PT DURATION: 3 weeks  PLANNED INTERVENTIONS: 97110-Therapeutic exercises, 97530- Therapeutic activity, 97535- Self Care, 16109- Manual therapy, Patient/Family education, Balance training, Joint mobilization, Therapeutic exercises, Therapeutic activity, Neuromuscular re-education, Gait training, and Self Care  PLAN FOR NEXT SESSION: bil ROM, Lt cording work  Encarnacion Harris, PT 09/11/2023, 8:48 AM

## 2023-09-11 ENCOUNTER — Other Ambulatory Visit: Payer: Self-pay

## 2023-09-11 ENCOUNTER — Encounter: Payer: Self-pay | Admitting: Rehabilitation

## 2023-09-11 ENCOUNTER — Ambulatory Visit: Attending: General Surgery | Admitting: Rehabilitation

## 2023-09-11 DIAGNOSIS — Z483 Aftercare following surgery for neoplasm: Secondary | ICD-10-CM | POA: Diagnosis present

## 2023-09-11 DIAGNOSIS — Z170 Estrogen receptor positive status [ER+]: Secondary | ICD-10-CM

## 2023-09-11 DIAGNOSIS — Z853 Personal history of malignant neoplasm of breast: Secondary | ICD-10-CM | POA: Insufficient documentation

## 2023-09-11 DIAGNOSIS — Z9189 Other specified personal risk factors, not elsewhere classified: Secondary | ICD-10-CM | POA: Diagnosis not present

## 2023-09-11 DIAGNOSIS — Z9013 Acquired absence of bilateral breasts and nipples: Secondary | ICD-10-CM | POA: Diagnosis not present

## 2023-09-11 DIAGNOSIS — C50912 Malignant neoplasm of unspecified site of left female breast: Secondary | ICD-10-CM

## 2023-09-13 ENCOUNTER — Ambulatory Visit (INDEPENDENT_AMBULATORY_CARE_PROVIDER_SITE_OTHER): Admitting: Family

## 2023-09-13 VITALS — BP 126/82 | HR 55 | Temp 97.6°F | Ht 64.0 in | Wt 144.0 lb

## 2023-09-13 DIAGNOSIS — B3731 Acute candidiasis of vulva and vagina: Secondary | ICD-10-CM

## 2023-09-13 DIAGNOSIS — R300 Dysuria: Secondary | ICD-10-CM | POA: Diagnosis not present

## 2023-09-13 LAB — POC URINALSYSI DIPSTICK (AUTOMATED)
Bilirubin, UA: NEGATIVE
Blood, UA: NEGATIVE
Glucose, UA: NEGATIVE
Ketones, UA: NEGATIVE
Nitrite, UA: NEGATIVE
Protein, UA: NEGATIVE
Spec Grav, UA: 1.025 (ref 1.010–1.025)
Urobilinogen, UA: 0.2 U/dL
pH, UA: 6 (ref 5.0–8.0)

## 2023-09-13 MED ORDER — FLUCONAZOLE 150 MG PO TABS: 150.0000 mg | ORAL_TABLET | Freq: Every day | ORAL | 0 refills | Status: AC

## 2023-09-13 MED ORDER — SULFAMETHOXAZOLE-TRIMETHOPRIM 800-160 MG PO TABS: 1.0000 | ORAL_TABLET | Freq: Two times a day (BID) | ORAL | 0 refills | Status: DC

## 2023-09-14 LAB — URINE CULTURE

## 2023-09-15 NOTE — Progress Notes (Signed)
 Acute Office Visit  Subjective:     Patient ID: [name], female    DOB: 1955/07/17, 68 y.o.   MRN: [id number]  Chief Complaint  Patient presents with   Dysuria    Had double mastectomy 3 weeks ago and was on a lot of medications, hx of many UTIs.   Symptoms started a week ago, was given 1/5 Cipro 3x . Dysuria symptoms restarted yesterday ant actually wants to be checked out again instead of treating herself.    HPI Patient is  a 68 year old female s/p mastectomy 3 weeks ago, in today with c/o vaginal burning and irritation. She has taken Cipro to help with the symptoms. She is unsure if the burning is coming from the urinary meatus or her vagina. She would like to be checked. Has been on multiple medications the last few weeks.   Review of Systems  Genitourinary:  Positive for frequency and urgency.  All other systems reviewed and are negative.  Past Medical History:  Diagnosis Date   Anxiety    Anxiety disorder    BRCA2 positive 07/14/2012   Cancer (HCC) 2008   left breast. Right Breast Cancer in 2020   Dyslipidemia    Fall    Goiter    History of radiation therapy    Hypothyroidism    Osteoporosis     Social History   Socioeconomic History   Marital status: Married    Spouse name: Ed Facilities manager   Number of children: 2   Years of education: Not on file   Highest education level: Master's degree (e.g., MA, MS, MEng, MEd, MSW, MBA)  Occupational History   Occupation: retired  Tobacco Use   Smoking status: Former    Types: Cigarettes   Smokeless tobacco: Never   Tobacco comments:    Smoked only in Designer, multimedia   Vaping status: Never Used  Substance and Sexual Activity   Alcohol use: Not Currently    Alcohol/week: 1.0 - 2.0 standard drink of alcohol    Types: 1 - 2 Standard drinks or equivalent per week   Drug use: No   Sexual activity: Yes    Birth control/protection: Post-menopausal  Other Topics Concern   Not on file  Social History  Narrative   Lives with husband 1 dog   Social Drivers of Health   Financial Resource Strain: Low Risk  (09/13/2023)   Overall Financial Resource Strain (CARDIA)    Difficulty of Paying Living Expenses: Not hard at all  Food Insecurity: No Food Insecurity (09/13/2023)   Hunger Vital Sign    Worried About Running Out of Food in the Last Year: Never true    Ran Out of Food in the Last Year: Never true  Transportation Needs: No Transportation Needs (09/13/2023)   PRAPARE - Administrator, Civil Service (Medical): No    Lack of Transportation (Non-Medical): No  Physical Activity: Inactive (09/13/2023)   Exercise Vital Sign    Days of Exercise per Week: 0 days    Minutes of Exercise per Session: Not on file  Stress: No Stress Concern Present (09/13/2023)   [employer] of Occupational Health - Occupational Stress Questionnaire    Feeling of Stress: Only a little  Social Connections: Socially Integrated (09/13/2023)   Social Connection and Isolation Panel    Frequency of Communication with Friends and Family: More than three times a week    Frequency of Social Gatherings with Friends and Family: More  than three times a week    Attends Religious Services: More than 4 times per year    Active Member of Clubs or Organizations: Yes    Attends Banker Meetings: More than 4 times per year    Marital Status: Married  Catering manager Violence: Not At Risk (08/22/2023)   Humiliation, Afraid, Rape, and Kick questionnaire    Fear of Current or Ex-Partner: No    Emotionally Abused: No    Physically Abused: No    Sexually Abused: No    Past Surgical History:  Procedure Laterality Date   ABDOMINAL HYSTERECTOMY  2009   with BSO   BREAST LUMPECTOMY  [id number], 1991   BREAST LUMPECTOMY WITH RADIOACTIVE SEED LOCALIZATION Right 10/02/2018   Procedure: RIGHT BREAST LUMPECTOMY WITH RADIOACTIVE SEED LOCALIZATION;  Surgeon: Caralyn Chandler, MD;  Location: [facility];  Service: General;  Laterality: Right;   COLONOSCOPY N/A 12/13/2015   Procedure: COLONOSCOPY;  Surgeon: Garrett Kallman, MD;  Location: [facility];  Service: Endoscopy;  Laterality: N/A;   COLONOSCOPY  2023   FRACTURE SURGERY  2015   left hip   HARDWARE REMOVAL Left 07/08/2015   Procedure: LEFT HIP HARDWARE REMOVAL;  Surgeon: Liliane Rei, MD;  Location: [facility];  Service: Orthopedics;  Laterality: Left;   MASTECTOMY W/ SENTINEL NODE BIOPSY Left 08/22/2023   Procedure: MASTECTOMY WITH SENTINEL LYMPH NODE BIOPSY;  Surgeon: Enid Harry, MD;  Location: [facility];  Service: General;  Laterality: Left;  LMA w/ PEC BLOCK 150 MINUTES LEFT MASTECTOMY LEFT AXILLARY SENTINEL NODE BIOPSY MAGTRACE AND BLUE DYE INJECTION   PORTACATH PLACEMENT N/A 08/22/2023   Procedure: INSERTION, TUNNELED CENTRAL VENOUS DEVICE, WITH PORT;  Surgeon: Enid Harry, MD;  Location: [facility];  Service: General;  Laterality: N/A;  PORT PLACEMENT WITH ULTRASOUND GUIDANCE   TOTAL MASTECTOMY Right 08/22/2023   Procedure: MASTECTOMY, SIMPLE;  Surgeon: Enid Harry, MD;  Location: [facility];  Service: General;  Laterality: Right;  RIGHT RISK REDUCING MASTECTOMY    Family History  Problem Relation Age of Onset   Breast cancer Mother    Colon cancer Father 15   Cancer Maternal Grandmother        unknown primary   Esophageal cancer Neg Hx    Stomach cancer Neg Hx     Allergies  Allergen Reactions   Adhesive [Tape] Rash   Chloraprep One Step [Chlorhexidine  Gluconate] Itching    Skin tears   Latex Rash   Shellfish Allergy Anaphylaxis   Cephalexin Other (See Comments)    Unknown reaction    Ampicillin Rash   Betadine  [Povidone Iodine ] Rash   Macrodantin [Nitrofurantoin] Rash    Current Outpatient Medications on File Prior to Visit  Medication Sig Dispense Refill   atorvastatin  (LIPITOR) 10 MG tablet Take 0.5 tablets (5 mg total) by mouth daily. 90 tablet 1   clorazepate (TRANXENE) 15 MG tablet Take  7.5-15 mg by mouth See admin instructions. Take 15 mg at bedtime, may take a 7.5 mg dose in the morning as needed anxiety     dexamethasone  (DECADRON ) 4 MG tablet Take 2 tablets (8 mg total) by mouth daily for 3 days. Start the day after doxorubicin/cyclophosphamide chemotherapy. Take with food. 30 tablet 1   docusate sodium  (COLACE) 250 MG capsule Take 250 mg by mouth daily.     levothyroxine  (SYNTHROID ) 125 MCG tablet Take 1 tablet (125 mcg total) by mouth daily. Take 5 days a  week. (Patient taking differently: Take 125  mcg by mouth every Monday, Tuesday, Wednesday, Thursday, and Friday. Take 5 days a  week.) 90 tablet 1   lidocaine -prilocaine  (EMLA ) cream Apply to affected area once 30 g 3   methocarbamol  (ROBAXIN ) 500 MG tablet Take 1 tablet (500 mg total) by mouth every 8 (eight) hours as needed (use for muscle cramps/pain). 30 tablet 0   Multiple Vitamins-Minerals (LUTEIN-ZEAXANTHIN) TABS Take 1 tablet by mouth daily.     ondansetron  (ZOFRAN ) 8 MG tablet Take 1 tab (8 mg) by mouth every 8 hrs as needed for nausea/vomiting. Start third day after doxorubicin/cyclophosphamide chemotherapy. 30 tablet 1   prochlorperazine  (COMPAZINE ) 10 MG tablet Take 1 tablet (10 mg total) by mouth every 6 (six) hours as needed for nausea or vomiting. 30 tablet 1   traMADol (ULTRAM) 50 MG tablet Take 50 mg by mouth every 6 (six) hours as needed.     traZODone  (DESYREL ) 100 MG tablet Take 200 mg by mouth at bedtime.     VITAMIN D  PO Place 1 Dose under the tongue daily. drops     No current facility-administered medications on file prior to visit.    BP 126/82 (BP Location: Left Arm, Patient Position: Sitting)   Pulse (!) 55   Temp 97.6 F (36.4 C) (Temporal)   Ht 5' 4 (1.626 m)   Wt 144 lb (65.3 kg)   SpO2 98%   BMI 24.72 kg/m chart      Objective:    BP 126/82 (BP Location: Left Arm, Patient Position: Sitting)   Pulse (!) 55   Temp 97.6 F (36.4 C) (Temporal)   Ht 5' 4 (1.626 m)   Wt 144 lb  (65.3 kg)   SpO2 98%   BMI 24.72 kg/m    Physical Exam Vitals reviewed. Exam conducted with a chaperone present.  Constitutional:      Appearance: Normal appearance.   Cardiovascular:     Rate and Rhythm: Normal rate and regular rhythm.     Pulses: Normal pulses.     Heart sounds: Normal heart sounds.  Pulmonary:     Effort: Pulmonary effort is normal.     Breath sounds: Normal breath sounds.  Abdominal:     General: Abdomen is flat.     Palpations: Abdomen is soft.  Genitourinary:    General: Normal vulva.     Vagina: Erythema and tenderness present. No vaginal discharge, bleeding or prolapsed vaginal walls.   Neurological:     General: No focal deficit present.     Mental Status: She is alert and oriented to person, place, and time. Mental status is at baseline.   Psychiatric:        Mood and Affect: Mood normal.        Behavior: Behavior normal.        Thought Content: Thought content normal.     Results for orders placed or performed in visit on 09/13/23  Urine Culture   Specimen: Urine  Result Value Ref Range   Source: URINE    Status: FINAL    Result:      Less than 10,000 CFU/mL of single Gram positive organism isolated. No further testing will be performed. If clinically indicated, recollection using a method to minimize contamination, with prompt transfer to Urine Culture Transport Tube, is recommended.  POCT Urinalysis Dipstick (Automated)  Result Value Ref Range   Color, UA dark yellow    Clarity, UA clear    Glucose, UA Negative Negative   Bilirubin, UA negative  Ketones, UA negative    Spec Grav, UA 1.025 1.010 - 1.025   Blood, UA negative    pH, UA 6.0 5.0 - 8.0   Protein, UA Negative Negative   Urobilinogen, UA 0.2 0.2 or 1.0 E.U./dL   Nitrite, UA negative    Leukocytes, UA Trace (A) Negative        Assessment & Plan:   Problem List Items Addressed This Visit   None Visit Diagnoses       Dysuria    -  Primary   Relevant Orders    POCT Urinalysis Dipstick (Automated) (Completed)   Urine Culture (Completed)     Vaginal candida       Relevant Medications   sulfamethoxazole-trimethoprim (BACTRIM DS) 800-160 MG tablet   fluconazole (DIFLUCAN) 150 MG tablet   Other Relevant Orders   NuSwab Vaginitis Plus (VG+)       Meds ordered this encounter  Medications   sulfamethoxazole-trimethoprim (BACTRIM DS) 800-160 MG tablet    Sig: Take 1 tablet by mouth 2 (two) times daily.    Dispense:  10 tablet    Refill:  0   fluconazole (DIFLUCAN) 150 MG tablet    Sig: Take 1 tablet (150 mg total) by mouth daily.    Dispense:  1 tablet    Refill:  0   Urine shows small bacteria. Will start Bactrim since she did not have enough medication to complete the treatment for UTI. Will also send in diflucan for vaginal irritation and yeast. Can apply monistat externally.   No follow-ups on file.  Axle Parfait B Meleni Delahunt, FNP

## 2023-09-16 ENCOUNTER — Telehealth: Payer: Self-pay | Admitting: Pharmacist

## 2023-09-16 NOTE — Telephone Encounter (Signed)
 [facility]       Telephone: [phone]?Fax: [phone]   Oncology Clinical Pharmacist Practitioner Progress Note  [name] is a 68 y.o. female with a diagnosis of breast who will be starting ddAC-T on 09/23/23 under the care of Dr. Sonja Cullison.   I connected with Johnasia Liese Yohannes's husband today, Dr. Ailene Housekeeper, by telephone. He had questions about the barrier methods listed in the education packet given when discussing sexual activity. Reviewed that the barrier methods listed refer to sexual intercourse and they are provided out of an abundance of caution to protect the patient's partner from exposure although most experts on the topic say percentages of possible transmission would be very low.  Other persons participating in the visit and their role in the encounter: none   Patient's location: home  Provider's location: clinic  [name] is scheduled to start chemotherapy on 09/23/23 and will have labs and see Lacie Burton NP prior.  Clinical pharmacy will continue to support Vani D Shippey and Dr. Sonja Parshall as needed.  Jordain Radin A. Webb Hake, PharmD, BCOP, CPP  Althea Atkinson, RPH-CPP,  09/16/2023  8:27 AM   **Disclaimer: This note was dictated with voice recognition software. Similar sounding words can inadvertently be transcribed and this note may contain transcription errors which may not have been corrected upon publication of note.**

## 2023-09-16 NOTE — Progress Notes (Signed)
 Pharmacist Chemotherapy Monitoring - Initial Assessment    Anticipated start date: 09/23/23   The following has been reviewed per standard work regarding the patient's treatment regimen: The patient's diagnosis, treatment plan and drug doses, and organ/hematologic function Lab orders and baseline tests specific to treatment regimen  The treatment plan start date, drug sequencing, and pre-medications Prior authorization status  Patient's documented medication list, including drug-drug interaction screen and prescriptions for anti-emetics and supportive care specific to the treatment regimen The drug concentrations, fluid compatibility, administration routes, and timing of the medications to be used The patient's access for treatment and lifetime cumulative dose history, if applicable  The patient's medication allergies and previous infusion related reactions, if applicable   Changes made to treatment plan:  N/A  Follow up needed:  ECHO scheduled 09/20/23   Cherylynn Cosier, [facility], 09/16/2023  12:37 PM

## 2023-09-17 ENCOUNTER — Ambulatory Visit

## 2023-09-17 ENCOUNTER — Ambulatory Visit: Payer: Self-pay

## 2023-09-17 DIAGNOSIS — Z9189 Other specified personal risk factors, not elsewhere classified: Secondary | ICD-10-CM

## 2023-09-17 DIAGNOSIS — Z483 Aftercare following surgery for neoplasm: Secondary | ICD-10-CM | POA: Diagnosis not present

## 2023-09-17 DIAGNOSIS — Z170 Estrogen receptor positive status [ER+]: Secondary | ICD-10-CM

## 2023-09-17 DIAGNOSIS — C50912 Malignant neoplasm of unspecified site of left female breast: Secondary | ICD-10-CM

## 2023-09-17 DIAGNOSIS — Z9013 Acquired absence of bilateral breasts and nipples: Secondary | ICD-10-CM

## 2023-09-17 LAB — NUSWAB VAGINITIS PLUS (VG+)
Candida albicans, NAA: NEGATIVE
Candida glabrata, NAA: NEGATIVE
Chlamydia trachomatis, NAA: NEGATIVE
Neisseria gonorrhoeae, NAA: NEGATIVE
Trich vag by NAA: NEGATIVE

## 2023-09-17 NOTE — Therapy (Addendum)
 OUTPATIENT PHYSICAL THERAPY  UPPER EXTREMITY ONCOLOGY TREATMENT  Patient Name: [name] MRN: [id number] DOB:12-Oct-1955, 68 y.o., female Today's Date: 09/17/2023  END OF SESSION:  PT End of Session - 09/17/23 1217     Visit Number 2    Number of Visits 7    Date for PT Re-Evaluation 10/09/23    PT Start Time 1205    PT Stop Time 1305    PT Time Calculation (min) 60 min    Activity Tolerance Patient tolerated treatment well    Behavior During Therapy [facility] for tasks assessed/performed          Past Medical History:  Diagnosis Date   Anxiety    Anxiety disorder    BRCA2 positive 07/14/2012   Cancer (HCC) 2008   left breast. Right Breast Cancer in 2020   Dyslipidemia    Fall    Goiter    History of radiation therapy    Hypothyroidism    Osteoporosis    Past Surgical History:  Procedure Laterality Date   ABDOMINAL HYSTERECTOMY  2009   with BSO   BREAST LUMPECTOMY  [id number], 1991   BREAST LUMPECTOMY WITH RADIOACTIVE SEED LOCALIZATION Right 10/02/2018   Procedure: RIGHT BREAST LUMPECTOMY WITH RADIOACTIVE SEED LOCALIZATION;  Surgeon: Caralyn Chandler, MD;  Location: [facility];  Service: General;  Laterality: Right;   COLONOSCOPY N/A 12/13/2015   Procedure: COLONOSCOPY;  Surgeon: Garrett Kallman, MD;  Location: [facility];  Service: Endoscopy;  Laterality: N/A;   COLONOSCOPY  2023   FRACTURE SURGERY  2015   left hip   HARDWARE REMOVAL Left 07/08/2015   Procedure: LEFT HIP HARDWARE REMOVAL;  Surgeon: Liliane Rei, MD;  Location: [facility];  Service: Orthopedics;  Laterality: Left;   MASTECTOMY W/ SENTINEL NODE BIOPSY Left 08/22/2023   Procedure: MASTECTOMY WITH SENTINEL LYMPH NODE BIOPSY;  Surgeon: Enid Harry, MD;  Location: [facility] OR;  Service: General;  Laterality: Left;  LMA w/ PEC BLOCK 150 MINUTES LEFT MASTECTOMY LEFT AXILLARY SENTINEL NODE BIOPSY MAGTRACE AND BLUE DYE INJECTION   PORTACATH PLACEMENT N/A 08/22/2023   Procedure: INSERTION,  TUNNELED CENTRAL VENOUS DEVICE, WITH PORT;  Surgeon: Enid Harry, MD;  Location: [facility];  Service: General;  Laterality: N/A;  PORT PLACEMENT WITH ULTRASOUND GUIDANCE   TOTAL MASTECTOMY Right 08/22/2023   Procedure: MASTECTOMY, SIMPLE;  Surgeon: Enid Harry, MD;  Location: [facility] OR;  Service: General;  Laterality: Right;  RIGHT RISK REDUCING MASTECTOMY   Patient Active Problem List   Diagnosis Date Noted   S/P bilateral mastectomy 08/22/2023   Malignant neoplasm of central portion of left breast in female, estrogen receptor negative (HCC) 07/17/2023   Anxiety 11/19/2022   Osteoporosis - [facility] 11/18/2022   Hypothyroidism 11/18/2022   Hyperlipidemia 11/18/2022   BRCA2 gene mutation positive in female 06/10/2022   Malignant neoplasm of central portion of right breast in female, estrogen receptor positive (HCC) 09/12/2018   Breast cancer, BRCA2 positive, left (HCC) 04/15/2017   Breast cancer screening, high risk patient 04/15/2017   Malignant neoplasm of upper-outer quadrant of left breast in female, estrogen receptor positive (HCC) 01/29/2013     REFERRING PROVIDER: Enid Harry, MD  REFERRING DIAG:  Diagnosis  [phone] (ICD-10-CM) - Postoperative state  C50.412,Z17.1 (ICD-10-CM) - Malignant neoplasm of upper-outer quadrant of left breast in female, estrogen receptor negative (HCC)  Z15.01,Z15.09 (ICD-10-CM) - BRCA2 positive    THERAPY DIAG:  Malignant neoplasm of upper-outer quadrant of left breast in female, estrogen receptor positive (HCC)  Breast cancer, BRCA2 positive, left (HCC)  S/P bilateral mastectomy  At risk for lymphedema  Aftercare following surgery for neoplasm  ONSET DATE: 06/21/23  Rationale for Evaluation and Treatment: Rehabilitation  SUBJECTIVE:                                                                                                                                                                                            SUBJECTIVE STATEMENT: I haven't been able to do the HEP 2x/day yet because I'm so tight but I have been trying to use my arms more.   PERTINENT HISTORY: Left breast Grade 2 IDC, triple negative +BRCA2.  Bil mastectomy 08/22/23 with 6 negative nodes. Will have chemo AC-P. Hx of 2 other episodes of breast cancer 2008 in left breast and 2020 in Rt breast. Lymph nodes only out on the left, 2 out previously for a total of 8.  Never anything out on the right.  Hx of radiation bilaterally.  Will start chemo in 2 weeks.    PAIN:  Are you having pain? Yes NPRS scale: 4/10 Pain location: across the chest  Pain orientation: Right and Left  PAIN TYPE: aching, burning, and tight Pain description: constant  Aggravating factors: nothing really Relieving factors: oxycodone  and now tramadol and robaxin  and tylenol ; stretches have helped some   PRECAUTIONS: Left arm lymphedema risk   RED FLAGS: None   WEIGHT BEARING RESTRICTIONS: No  FALLS:  Has patient fallen in last 6 months? No  LIVING ENVIRONMENT: Lives with: lives with their family and lives with their spouse  OCCUPATION: retired   LEISURE: back to tennis, lifting weights, exercising    HAND DOMINANCE: right   PRIOR LEVEL OF FUNCTION: Independent  PATIENT GOALS: get back to normal activity    OBJECTIVE: Note: Objective measures were completed at Evaluation unless otherwise noted.  COGNITION: Overall cognitive status: Within functional limits for tasks assessed   PALPATION: Cording noted in Left axilla and upper arm   OBSERVATIONS / OTHER ASSESSMENTS: wearing a compression bra, incisions covered with steristrips bil with some redness from irritation from prep from surgery.    SENSATION: Some numbness chest and axilla   POSTURE: WNL  UPPER EXTREMITY AROM/PROM:  A/PROM RIGHT   eval   Shoulder extension 65  Shoulder flexion 120  Shoulder abduction 135  Shoulder internal rotation 85  Shoulder external rotation 85     (Blank rows = not tested)  A/PROM LEFT   eval  Shoulder extension 65  Shoulder flexion 122  Shoulder abduction 122  Shoulder internal rotation 85  Shoulder external rotation 82    (Blank rows =  not tested)   UPPER EXTREMITY STRENGTH:   LYMPHEDEMA ASSESSMENTS:   LANDMARK RIGHT  eval  At axilla    15 cm proximal to olecranon process   10 cm proximal to olecranon process 26.9  Olecranon process 24.5  15 cm proximal to ulnar styloid process   10 cm proximal to ulnar styloid process 19.3  Just proximal to ulnar styloid process 15.8  Across hand at thumb web space 19.6  At base of 2nd digit 6.5  (Blank rows = not tested)  LANDMARK LEFT  eval  At axilla    15 cm proximal to olecranon process   10 cm proximal to olecranon process 27  Olecranon process 25  15 cm proximal to ulnar styloid process   10 cm proximal to ulnar styloid process 19.5  Just proximal to ulnar styloid process 16.1  Across hand at thumb web space 19.6  At base of 2nd digit 6.7  (Blank rows = not tested)   L-DEX LYMPHEDEMA SCREENING: The patient was assessed using the L-Dex machine today to produce a lymphedema index baseline score. The patient will be reassessed on a regular basis (typically every 3 months) to obtain new L-Dex scores. If the score is > 6.5 points away from his/her baseline score indicating onset of subclinical lymphedema, it will be recommended to wear a compression garment for 4 weeks, 12 hours per day and then be reassessed. If the score continues to be > 6.5 points from baseline at reassessment, we will initiate lymphedema treatment. Assessing in this manner has a 95% rate of preventing clinically significant lymphedema.  QUICK DASH SURVEY: 54% limited                                                                                                                               TREATMENT DATE:  09/17/23: Manual Therapy P/ROM to bil shoulders in supine into flex, abd and D2 MFR  along cording in Lt axilla and into medial upper arm STM gently to bil pect insertions where pt palpably tight, Lt>Rt Self Care Briefly reviewed scap retract from HEP as this was only one she had a question about. Educated her about how this is helping her to regain correct posture. Issued 1/2 gray foam in TG soft for pt to wear at reddened area of Rt axilla where her bra also rubs and further irritates.  During session noted redness at Rt axilla and pt isn't sure if this was from her having a reaction to adhesive, which would be normal for her, so took pictures of bil mastectomy incisions so pt could compare latera today to see if redness is spreading. She was educated to update doctor if redness spreads at all. Also if any odor or other S/S of infection are noted. Also a steri strip had come off of port incision and there is now a small pinhole exposed so showed pt this with mirror and covered with nonadherent gauze and paper  tape before pt left and was instructed to be mindful of this as well. Pt verbalized understanding of all   09/11/23 Eval performed Education on cording Edu and performance of HEP per below.  Completed SOZO and lymphedema education Discussed POC     PATIENT EDUCATION:  Education details: per today's note Person educated: Patient and Spouse Education method: Explanation, Demonstration, Tactile cues, Verbal cues, and Handouts Education comprehension: verbalized understanding and returned demonstration  HOME EXERCISE PROGRAM: Access Code: [email or web address] URL: [email or web address] Date: 09/11/2023 Prepared by: Judy Null  Exercises - Supine Shoulder Flexion Extension AAROM with Dowel  - 1-2 x daily - 7 x weekly - 10 reps - 5 seconds hold - Supine Chest Stretch with Elbows Bent  - 1 x daily - 7 x weekly - 1 sets - 3 reps - 30-60seconds hold - Seated Scapular Retraction  - 1 x daily - 7 x weekly - 1-3 sets - 10 reps - 2-3 seconds hold - Standing Shoulder  Abduction Finger Walk at Wall  - 1 x daily - 7 x weekly - 1-3 sets - 10 reps - 5 seconds hold  ASSESSMENT:  CLINICAL IMPRESSION: First session of manual therapy today. Pt tolerated this very well and reports feeling looser from stretching. Briefly reviewed scap retract as this was only question she had about HEP. See above for self care instructions.    OBJECTIVE IMPAIRMENTS: decreased activity tolerance, decreased knowledge of condition, decreased knowledge of use of DME, decreased mobility, and decreased ROM.   ACTIVITY LIMITATIONS: carrying, lifting, and reach over head  PARTICIPATION LIMITATIONS: community activity and yard work  PERSONAL FACTORS: 1-2 comorbidities: previous hx of radiation and SLNB are also affecting patient's functional outcome.   REHAB POTENTIAL: Excellent  CLINICAL DECISION MAKING: Stable/uncomplicated  EVALUATION COMPLEXITY: Low  GOALS: Goals reviewed with patient? Yes  SHORT TERM GOALS: Target date: 09/11/23   Pt will be educated on initial HEP to start for post-op ROM improvements Baseline: Goal status: MET   LONG TERM GOALS: Target date: 10/09/23  Pt will improve bil AROM to flexion and abduction of at least 160 to return to full reach Baseline:  Goal status: INITIAL  2.  Pt will be educated on lymphedema risk reduction and will have watched the ABC video Baseline:  Goal status: INITIAL  3.  Pt will be educated on exercises to continue during chemotherapy to minimize muscle loss Baseline:  Goal status: INITIAL   PLAN:  PT FREQUENCY: 2x/week  PT DURATION: 3 weeks  PLANNED INTERVENTIONS: [id number]-Therapeutic exercises, 97530- Therapeutic activity, 97535- Self Care, 54098- Manual therapy, Patient/Family education, Balance training, Joint mobilization, Therapeutic exercises, Therapeutic activity, Neuromuscular re-education, Gait training, and Self Care  PLAN FOR NEXT SESSION: Cont bil ROM, Lt cording work, how is redness?  Denyce Flank, PTA 09/17/2023, 1:23 PM

## 2023-09-19 ENCOUNTER — Other Ambulatory Visit: Payer: Self-pay | Admitting: Hematology

## 2023-09-19 ENCOUNTER — Ambulatory Visit

## 2023-09-19 DIAGNOSIS — C50912 Malignant neoplasm of unspecified site of left female breast: Secondary | ICD-10-CM

## 2023-09-19 DIAGNOSIS — Z170 Estrogen receptor positive status [ER+]: Secondary | ICD-10-CM

## 2023-09-19 DIAGNOSIS — Z483 Aftercare following surgery for neoplasm: Secondary | ICD-10-CM

## 2023-09-19 DIAGNOSIS — C50412 Malignant neoplasm of upper-outer quadrant of left female breast: Secondary | ICD-10-CM

## 2023-09-19 DIAGNOSIS — Z9013 Acquired absence of bilateral breasts and nipples: Secondary | ICD-10-CM

## 2023-09-19 DIAGNOSIS — Z9189 Other specified personal risk factors, not elsewhere classified: Secondary | ICD-10-CM

## 2023-09-19 NOTE — Therapy (Signed)
 OUTPATIENT PHYSICAL THERAPY  UPPER EXTREMITY ONCOLOGY TREATMENT  Patient Name: [name] MRN: [id number] DOB:1956/01/20, 68 y.o., female Today's Date: 09/19/2023  END OF SESSION:  PT End of Session - 09/19/23 1206     Visit Number 3    Number of Visits 7    Date for PT Re-Evaluation 10/09/23    PT Start Time 1205    PT Stop Time 1300    PT Time Calculation (min) 55 min    Activity Tolerance Patient tolerated treatment well    Behavior During Therapy [facility] for tasks assessed/performed          Past Medical History:  Diagnosis Date   Anxiety    Anxiety disorder    BRCA2 positive 07/14/2012   Cancer (HCC) 2008   left breast. Right Breast Cancer in 2020   Dyslipidemia    Fall    Goiter    History of radiation therapy    Hypothyroidism    Osteoporosis    Past Surgical History:  Procedure Laterality Date   ABDOMINAL HYSTERECTOMY  2009   with BSO   BREAST LUMPECTOMY  [id number],[id number], 1991   BREAST LUMPECTOMY WITH RADIOACTIVE SEED LOCALIZATION Right 10/02/2018   Procedure: RIGHT BREAST LUMPECTOMY WITH RADIOACTIVE SEED LOCALIZATION;  Surgeon: Caralyn Chandler, MD;  Location: [facility];  Service: General;  Laterality: Right;   COLONOSCOPY N/A 12/13/2015   Procedure: COLONOSCOPY;  Surgeon: Garrett Kallman, MD;  Location: [facility];  Service: Endoscopy;  Laterality: N/A;   COLONOSCOPY  2023   FRACTURE SURGERY  2015   left hip   HARDWARE REMOVAL Left 07/08/2015   Procedure: LEFT HIP HARDWARE REMOVAL;  Surgeon: Liliane Rei, MD;  Location: [facility];  Service: Orthopedics;  Laterality: Left;   MASTECTOMY W/ SENTINEL NODE BIOPSY Left 08/22/2023   Procedure: MASTECTOMY WITH SENTINEL LYMPH NODE BIOPSY;  Surgeon: Enid Harry, MD;  Location: [facility] OR;  Service: General;  Laterality: Left;  LMA w/ PEC BLOCK 150 MINUTES LEFT MASTECTOMY LEFT AXILLARY SENTINEL NODE BIOPSY MAGTRACE AND BLUE DYE INJECTION   PORTACATH PLACEMENT N/A 08/22/2023   Procedure: INSERTION,  TUNNELED CENTRAL VENOUS DEVICE, WITH PORT;  Surgeon: Enid Harry, MD;  Location: [facility];  Service: General;  Laterality: N/A;  PORT PLACEMENT WITH ULTRASOUND GUIDANCE   TOTAL MASTECTOMY Right 08/22/2023   Procedure: MASTECTOMY, SIMPLE;  Surgeon: Enid Harry, MD;  [facility];  Service: General;  Laterality: Right;  RIGHT RISK REDUCING MASTECTOMY   Patient Active Problem List   Diagnosis Date Noted   S/P bilateral mastectomy 08/22/2023   Malignant neoplasm of central portion of left breast in female, estrogen receptor negative (HCC) 07/17/2023   Anxiety 11/19/2022   Osteoporosis - [facility] 11/18/2022   Hypothyroidism 11/18/2022   Hyperlipidemia 11/18/2022   BRCA2 gene mutation positive in female 06/10/2022   Malignant neoplasm of central portion of right breast in female, estrogen receptor positive (HCC) 09/12/2018   Breast cancer, BRCA2 positive, left (HCC) 04/15/2017   Breast cancer screening, high risk patient 04/15/2017   Malignant neoplasm of upper-outer quadrant of left breast in female, estrogen receptor positive (HCC) 01/29/2013     REFERRING PROVIDER: Enid Harry, MD  REFERRING DIAG:  Diagnosis  [phone] (ICD-10-CM) - Postoperative state  C50.412,Z17.1 (ICD-10-CM) - Malignant neoplasm of upper-outer quadrant of left breast in female, estrogen receptor negative (HCC)  Z15.01,Z15.09 (ICD-10-CM) - BRCA2 positive    THERAPY DIAG:  Malignant neoplasm of upper-outer quadrant of left breast in female, estrogen receptor positive (HCC)  Breast cancer, BRCA2 positive, left (HCC)  S/P bilateral mastectomy  At risk for lymphedema  Aftercare following surgery for neoplasm  ONSET DATE: 06/21/23  Rationale for Evaluation and Treatment: Rehabilitation  SUBJECTIVE:                                                                                                                                                                                            SUBJECTIVE STATEMENT: I have to thank you because I did end up having an infection. My husband is a retired Librarian, academic and he was able to get me started on a prescription of doxycycline and then I ended up making an appt to see the PA yesterday and they agreed I had cellulitis. She took a picture of the pin hole you saw at my port site and sent it to the doctor to see if they need to do anything else. She placed a steristrip back over it so it's covered back up now.   PERTINENT HISTORY: Left breast Grade 2 IDC, triple negative +BRCA2.  Bil mastectomy 08/22/23 with 6 negative nodes. Will have chemo AC-P. Hx of 2 other episodes of breast cancer 2008 in left breast and 2020 in Rt breast. Lymph nodes only out on the left, 2 out previously for a total of 8.  Never anything out on the right.  Hx of radiation bilaterally.  Will start chemo in 2 weeks.    PAIN:  Are you having pain? Yes NPRS scale: 3/10 Pain location: across the chest  Pain orientation: Right and Left  PAIN TYPE: aching, burning, and tight Pain description: constant  Aggravating factors: probably from the infection Relieving factors: the stretches are helping and now being on doxycycline  PRECAUTIONS: Left arm lymphedema risk   RED FLAGS: None   WEIGHT BEARING RESTRICTIONS: No  FALLS:  Has patient fallen in last 6 months? No  LIVING ENVIRONMENT: Lives with: lives with their family and lives with their spouse  OCCUPATION: retired   LEISURE: back to tennis, lifting weights, exercising    HAND DOMINANCE: right   PRIOR LEVEL OF FUNCTION: Independent  PATIENT GOALS: get back to normal activity    OBJECTIVE: Note: Objective measures were completed at Evaluation unless otherwise noted.  COGNITION: Overall cognitive status: Within functional limits for tasks assessed   PALPATION: Cording noted in Left axilla and upper arm   OBSERVATIONS / OTHER ASSESSMENTS: wearing a compression bra, incisions covered with steristrips  bil with some redness from irritation from prep from surgery.    SENSATION: Some numbness chest and axilla   POSTURE: WNL  UPPER EXTREMITY AROM/PROM:  A/PROM RIGHT  eval   Shoulder extension 65  Shoulder flexion 120  Shoulder abduction 135  Shoulder internal rotation 85  Shoulder external rotation 85    (Blank rows = not tested)  A/PROM LEFT   eval  Shoulder extension 65  Shoulder flexion 122  Shoulder abduction 122  Shoulder internal rotation 85  Shoulder external rotation 82    (Blank rows = not tested)   UPPER EXTREMITY STRENGTH:   LYMPHEDEMA ASSESSMENTS:   LANDMARK RIGHT  eval  At axilla    15 cm proximal to olecranon process   10 cm proximal to olecranon process 26.9  Olecranon process 24.5  15 cm proximal to ulnar styloid process   10 cm proximal to ulnar styloid process 19.3  Just proximal to ulnar styloid process 15.8  Across hand at thumb web space 19.6  At base of 2nd digit 6.5  (Blank rows = not tested)  LANDMARK LEFT  eval  At axilla    15 cm proximal to olecranon process   10 cm proximal to olecranon process 27  Olecranon process 25  15 cm proximal to ulnar styloid process   10 cm proximal to ulnar styloid process 19.5  Just proximal to ulnar styloid process 16.1  Across hand at thumb web space 19.6  At base of 2nd digit 6.7  (Blank rows = not tested)   L-DEX LYMPHEDEMA SCREENING: The patient was assessed using the L-Dex machine today to produce a lymphedema index baseline score. The patient will be reassessed on a regular basis (typically every 3 months) to obtain new L-Dex scores. If the score is > 6.5 points away from his/her baseline score indicating onset of subclinical lymphedema, it will be recommended to wear a compression garment for 4 weeks, 12 hours per day and then be reassessed. If the score continues to be > 6.5 points from baseline at reassessment, we will initiate lymphedema treatment. Assessing in this manner has a 95% rate  of preventing clinically significant lymphedema.  QUICK DASH SURVEY: 54% limited                                                                                                                               TREATMENT DATE:  09/19/23: Therapeutic Exercises Pulleys into flex x 3 mins and abd x 2 mins  Ball roll up wall into flex x 10, and then bil abd x 5 each returning therapist demo for all Modified downward dog on wall x 5, 5 sec holds Manual Therapy P/ROM to bil shoulders in supine into flex, abd and D2 MFR along cording in Lt axilla and into medial upper arm STM gently to bil pect insertions where pt palpably tight, Lt>Rt while being mindful of bil healing mastectomy incisions  09/17/23: Manual Therapy P/ROM to bil shoulders in supine into flex, abd and D2 MFR along cording in Lt axilla and into medial upper arm STM gently to bil pect insertions where pt palpably tight, Lt>Rt  Self Care Briefly reviewed scap retract from HEP as this was only one she had a question about. Educated her about how this is helping her to regain correct posture. Issued 1/2 gray foam in TG soft for pt to wear at reddened area of Rt axilla where her bra also rubs and further irritates.  During session noted redness at Rt axilla and pt isn't sure if this was from her having a reaction to adhesive, which would be normal for her, so took pictures of bil mastectomy incisions so pt could compare latera today to see if redness is spreading. She was educated to update doctor if redness spreads at all. Also if any odor or other S/S of infection are noted. Also a steri strip had come off of port incision and there is now a small pinhole exposed so showed pt this with mirror and covered with nonadherent gauze and paper tape before pt left and was instructed to be mindful of this as well. Pt verbalized understanding of all   09/11/23 Eval performed Education on cording Edu and performance of HEP per below.  Completed  SOZO and lymphedema education Discussed POC     PATIENT EDUCATION:  Education details: per today's note Person educated: Patient and Spouse Education method: Explanation, Demonstration, Tactile cues, Verbal cues, and Handouts Education comprehension: verbalized understanding and returned demonstration  HOME EXERCISE PROGRAM: Access Code: [email or web address] URL: [email or web address] Date: 09/11/2023 Prepared by: Judy Null  Exercises - Supine Shoulder Flexion Extension AAROM with Dowel  - 1-2 x daily - 7 x weekly - 10 reps - 5 seconds hold - Supine Chest Stretch with Elbows Bent  - 1 x daily - 7 x weekly - 1 sets - 3 reps - 30-60seconds hold - Seated Scapular Retraction  - 1 x daily - 7 x weekly - 1-3 sets - 10 reps - 2-3 seconds hold - Standing Shoulder Abduction Finger Walk at Wall  - 1 x daily - 7 x weekly - 1-3 sets - 10 reps - 5 seconds hold  ASSESSMENT:  CLINICAL IMPRESSION: Pt began doxycycline Mon night and reports had started noticing less pain almost immediately the next morning. Added AA/ROM stretches which pt reported feeling a good stretch with and was able to accomplish with little scapular compensations. Then continued with manual therapy working to decrease bil upper quadrant tightness. Pt still very red at Rt chest wall however pt reports redness had spread quickly after leaving here on Monday to under her incision, so now is less as it's getting smaller back to where redness started at lateral aspect of incision.    OBJECTIVE IMPAIRMENTS: decreased activity tolerance, decreased knowledge of condition, decreased knowledge of use of DME, decreased mobility, and decreased ROM.   ACTIVITY LIMITATIONS: carrying, lifting, and reach over head  PARTICIPATION LIMITATIONS: community activity and yard work  PERSONAL FACTORS: 1-2 comorbidities: previous hx of radiation and SLNB are also affecting patient's functional outcome.   REHAB POTENTIAL: Excellent  CLINICAL  DECISION MAKING: Stable/uncomplicated  EVALUATION COMPLEXITY: Low  GOALS: Goals reviewed with patient? Yes  SHORT TERM GOALS: Target date: 09/11/23   Pt will be educated on initial HEP to start for post-op ROM improvements Baseline: Goal status: MET   LONG TERM GOALS: Target date: 10/09/23  Pt will improve bil AROM to flexion and abduction of at least 160 to return to full reach Baseline:  Goal status: INITIAL  2.  Pt will be educated on lymphedema risk reduction and will have watched the ABC video  Baseline:  Goal status: INITIAL  3.  Pt will be educated on exercises to continue during chemotherapy to minimize muscle loss Baseline:  Goal status: INITIAL   PLAN:  PT FREQUENCY: 2x/week  PT DURATION: 3 weeks  PLANNED INTERVENTIONS: 97110-Therapeutic exercises, 97530- Therapeutic activity, 97535- Self Care, 16109- Manual therapy, Patient/Family education, Balance training, Joint mobilization, Therapeutic exercises, Therapeutic activity, Neuromuscular re-education, Gait training, and Self Care  PLAN FOR NEXT SESSION: Redness improving at Rt lateral incision? Cont bil ROM, Lt cording work and progress AA/A/ROM stretches  Denyce Flank, PTA 09/19/2023, 1:04 PM

## 2023-09-20 ENCOUNTER — Ambulatory Visit (HOSPITAL_COMMUNITY)
Admission: RE | Admit: 2023-09-20 | Discharge: 2023-09-20 | Disposition: A | Discharge status: Home / Self Care | Source: Ambulatory Visit | Attending: Hematology | Admitting: Hematology

## 2023-09-20 DIAGNOSIS — I509 Heart failure, unspecified: Secondary | ICD-10-CM | POA: Diagnosis present

## 2023-09-20 LAB — ECHOCARDIOGRAM COMPLETE
AR max vel: 2.13 cm2
AV Area VTI: 2.06 cm2
AV Area mean vel: 2.08 cm2
AV Mean grad: 3 mmHg
AV Peak grad: 4.8 mmHg
Ao pk vel: 1.1 m/s
Area-P 1/2: 3.27 cm2
P 1/2 time: 465 ms
S' Lateral: 2.8 cm

## 2023-09-20 NOTE — Progress Notes (Signed)
*  PRELIMINARY RESULTS* Echocardiogram 2D Echocardiogram has been performed.  [name] 09/20/2023, 10:51 AM

## 2023-09-21 ENCOUNTER — Ambulatory Visit: Payer: Self-pay | Admitting: Nurse Practitioner

## 2023-09-23 ENCOUNTER — Other Ambulatory Visit: Payer: Self-pay

## 2023-09-23 ENCOUNTER — Inpatient Hospital Stay

## 2023-09-23 ENCOUNTER — Ambulatory Visit (HOSPITAL_COMMUNITY)
Admission: RE | Admit: 2023-09-23 | Discharge: 2023-09-23 | Disposition: A | Discharge status: Home / Self Care | Source: Ambulatory Visit | Attending: Nurse Practitioner | Admitting: Nurse Practitioner

## 2023-09-23 ENCOUNTER — Encounter: Payer: Self-pay | Admitting: Nurse Practitioner

## 2023-09-23 ENCOUNTER — Inpatient Hospital Stay (HOSPITAL_BASED_OUTPATIENT_CLINIC_OR_DEPARTMENT_OTHER): Admitting: Nurse Practitioner

## 2023-09-23 VITALS — BP 104/60 | HR 60 | Temp 98.2°F | Resp 17 | Wt 143.7 lb

## 2023-09-23 DIAGNOSIS — Z171 Estrogen receptor negative status [ER-]: Secondary | ICD-10-CM

## 2023-09-23 DIAGNOSIS — C50112 Malignant neoplasm of central portion of left female breast: Secondary | ICD-10-CM | POA: Diagnosis not present

## 2023-09-23 DIAGNOSIS — C50912 Malignant neoplasm of unspecified site of left female breast: Secondary | ICD-10-CM | POA: Diagnosis not present

## 2023-09-23 DIAGNOSIS — Z95828 Presence of other vascular implants and grafts: Secondary | ICD-10-CM

## 2023-09-23 DIAGNOSIS — Z1501 Genetic susceptibility to malignant neoplasm of breast: Secondary | ICD-10-CM

## 2023-09-23 DIAGNOSIS — Z5111 Encounter for antineoplastic chemotherapy: Secondary | ICD-10-CM | POA: Diagnosis not present

## 2023-09-23 LAB — CMP (CANCER CENTER ONLY)
ALT: 26 U/L (ref 0–44)
AST: 19 U/L (ref 15–41)
Albumin: 4.3 g/dL (ref 3.5–5.0)
Alkaline Phosphatase: 73 U/L (ref 38–126)
Anion gap: 6 (ref 5–15)
BUN: 18 mg/dL (ref 8–23)
CO2: 27 mmol/L (ref 22–32)
Calcium: 9.8 mg/dL (ref 8.9–10.3)
Chloride: 101 mmol/L (ref 98–111)
Creatinine: 0.83 mg/dL (ref 0.44–1.00)
GFR, Estimated: >60 mL/min (ref >60)
Glucose, Bld: 92 mg/dL (ref 70–99)
Potassium: 4.3 mmol/L (ref 3.5–5.1)
Sodium: 134 mmol/L — ABNORMAL LOW (ref 135–145)
Total Bilirubin: 0.5 mg/dL (ref 0.0–1.2)
Total Protein: 7.2 g/dL (ref 6.5–8.1)

## 2023-09-23 LAB — CBC WITH DIFFERENTIAL (CANCER CENTER ONLY)
Abs Immature Granulocytes: 0.01 10*3/uL (ref 0.00–0.07)
Basophils Absolute: 0.1 10*3/uL (ref 0.0–0.1)
Basophils Relative: 2 %
Eosinophils Absolute: 0.3 10*3/uL (ref 0.0–0.5)
Eosinophils Relative: 5 %
HCT: 33.9 % — ABNORMAL LOW (ref 36.0–46.0)
Hemoglobin: 11.7 g/dL — ABNORMAL LOW (ref 12.0–15.0)
Immature Granulocytes: 0 %
Lymphocytes Relative: 24 %
Lymphs Abs: 1.3 10*3/uL (ref 0.7–4.0)
MCH: 29 pg (ref 26.0–34.0)
MCHC: 34.5 g/dL (ref 30.0–36.0)
MCV: 83.9 fL (ref 80.0–100.0)
Monocytes Absolute: 0.4 10*3/uL (ref 0.1–1.0)
Monocytes Relative: 8 %
Neutro Abs: 3.4 10*3/uL (ref 1.7–7.7)
Neutrophils Relative %: 61 %
Platelet Count: 257 10*3/uL (ref 150–400)
RBC: 4.04 MIL/uL (ref 3.87–5.11)
RDW: 12.5 % (ref 11.5–15.5)
WBC Count: 5.5 10*3/uL (ref 4.0–10.5)
nRBC: 0 % (ref 0.0–0.2)

## 2023-09-23 MED ORDER — SODIUM CHLORIDE 0.9% FLUSH
10.0000 mL | Freq: Once | INTRAVENOUS | Status: AC
  Administered 2023-09-23: 10 mL

## 2023-09-23 NOTE — Progress Notes (Addendum)
 [facility]   [phone] [phone]    Patient Care Team: Geofm Glade PARAS, MD as PCP - General (Internal Medicine) Tasia Lung, MD as Consulting Physician (Psychiatry) Melodi Lerner, MD as Consulting Physician (Orthopedic Surgery) Barbette Knock, MD as Consulting Physician (Obstetrics and Gynecology) Shannon Agent, MD as Consulting Physician (Radiation Oncology) Lanny Callander, MD as Consulting Physician (Hematology) [facility] as Consulting Physician (Radiology) Lita Lye, OD as Referring Physician (Optometry) Ebbie Cough, MD as Consulting Physician (General Surgery) Glean Stephane BROCKS, RN (Inactive) as Oncology Nurse Navigator Tyree Nanetta SAILOR, RN as Oncology Nurse Navigator   CHIEF COMPLAINT: Follow up left breast cancer, triple negative, BRCA2 +  Oncology History  Breast cancer, BRCA2 positive, left (HCC)  04/15/2017 Initial Diagnosis   Breast cancer, BRCA2 positive, left (HCC)   09/30/2023 -  Chemotherapy   Patient is on Treatment Plan : BREAST DOSE DENSE AC q14d / PACLitaxel q7d     Malignant neoplasm of central portion of right breast in female, estrogen receptor positive (HCC)  09/12/2018 Initial Diagnosis   Ductal carcinoma in situ (DCIS) of right breast   09/17/2018 Cancer Staging   Staging form: Breast, AJCC 8th Edition - Clinical: Stage 0 (cTis (DCIS), cN0, cM0, ER+, PR+, HER2: Not Assessed) - Signed by Crawford Morna Pickle, NP on 09/17/2018   10/02/2018 Cancer Staging   Staging form: Breast, AJCC 8th Edition - Pathologic stage from 10/02/2018: Stage IA (pT1b, pN0, cM0, G2, ER+, PR+, HER2-) - Signed by Crawford Morna Pickle, NP on 10/15/2018   Malignant neoplasm of central portion of left breast in female, estrogen receptor negative (HCC)  07/15/2023 Cancer Staging   Staging form: Breast, AJCC 8th Edition - Clinical stage from 07/15/2023: Stage IB (cT1b, cN0, cM0, G2, ER-, PR-, HER2-) - Signed  by Lanny Callander, MD on 07/17/2023 Stage prefix: Initial diagnosis Histologic grading system: 3 grade system   07/17/2023 Initial Diagnosis   Malignant neoplasm of central portion of left breast in female, estrogen receptor negative (HCC)      CURRENT THERAPY: PENDING adjuvant chemo AC-T  INTERVAL HISTORY Mrs. Senat presents with spouse for follow up and to start chemo as scheduled. She recovered well from surgery and has been doing PT who identified possible cellulitis, confirmed by surgical PA and she started doxy 6/18. Also noticed small opening at the medial port site. Cellulitis has improved. Denies fever. Eating/drinking and back to activities otherwise.   ROS  All other systems reviewed and negative  Past Medical History:  Diagnosis Date   Anxiety    Anxiety disorder    BRCA2 positive 07/14/2012   Cancer (HCC) 2008   left breast. Right Breast Cancer in 2020   Dyslipidemia    Fall    Goiter    History of radiation therapy    Hypothyroidism    Osteoporosis      Past Surgical History:  Procedure Laterality Date   ABDOMINAL HYSTERECTOMY  2009   with BSO   BREAST LUMPECTOMY  [id number],[id number], 1991   BREAST LUMPECTOMY WITH RADIOACTIVE SEED LOCALIZATION Right 10/02/2018   Procedure: RIGHT BREAST LUMPECTOMY WITH RADIOACTIVE SEED LOCALIZATION;  Surgeon: Curvin Deward MOULD, MD;  Location: [facility];  Service: General;  Laterality: Right;   COLONOSCOPY N/A 12/13/2015   Procedure: COLONOSCOPY;  Surgeon: Gladis MARLA Louder, MD;  Location: [facility];  Service: Endoscopy;  Laterality: N/A;   COLONOSCOPY  2023   FRACTURE SURGERY  2015   left  hip   HARDWARE REMOVAL Left 07/08/2015   Procedure: LEFT HIP HARDWARE REMOVAL;  Surgeon: Dempsey Moan, MD;  Location: [facility];  Service: Orthopedics;  Laterality: Left;   MASTECTOMY W/ SENTINEL NODE BIOPSY Left 08/22/2023   Procedure: MASTECTOMY WITH SENTINEL LYMPH NODE BIOPSY;  Surgeon: Ebbie Cough, MD;  Location: [facility];  Service:  General;  Laterality: Left;  LMA w/ PEC BLOCK 150 MINUTES LEFT MASTECTOMY LEFT AXILLARY SENTINEL NODE BIOPSY MAGTRACE AND BLUE DYE INJECTION   PORTACATH PLACEMENT N/A 08/22/2023   Procedure: INSERTION, TUNNELED CENTRAL VENOUS DEVICE, WITH PORT;  Surgeon: Ebbie Cough, MD;  Location: [facility] OR;  Service: General;  Laterality: N/A;  PORT PLACEMENT WITH ULTRASOUND GUIDANCE   TOTAL MASTECTOMY Right 08/22/2023   Procedure: MASTECTOMY, SIMPLE;  Surgeon: Ebbie Cough, MD;  Location: [facility] OR;  Service: General;  Laterality: Right;  RIGHT RISK REDUCING MASTECTOMY     Outpatient Encounter Medications as of 09/23/2023  Medication Sig   atorvastatin  (LIPITOR) 10 MG tablet Take 0.5 tablets (5 mg total) by mouth daily.   clorazepate (TRANXENE) 15 MG tablet Take 7.5-15 mg by mouth See admin instructions. Take 15 mg at bedtime, may take a 7.5 mg dose in the morning as needed anxiety   dexamethasone  (DECADRON ) 4 MG tablet Take 2 tablets (8 mg total) by mouth daily for 3 days. Start the day after doxorubicin/cyclophosphamide chemotherapy. Take with food.   docusate sodium  (COLACE) 250 MG capsule Take 250 mg by mouth daily.   fluconazole  (DIFLUCAN ) 150 MG tablet Take 1 tablet (150 mg total) by mouth daily.   levothyroxine  (SYNTHROID ) 125 MCG tablet Take 1 tablet (125 mcg total) by mouth daily. Take 5 days a  week. (Patient taking differently: Take 125 mcg by mouth every Monday, Tuesday, Wednesday, Thursday, and Friday. Take 5 days a  week.)   lidocaine -prilocaine  (EMLA ) cream Apply to affected area once   methocarbamol  (ROBAXIN ) 500 MG tablet Take 1 tablet (500 mg total) by mouth every 8 (eight) hours as needed (use for muscle cramps/pain).   Multiple Vitamins-Minerals (LUTEIN-ZEAXANTHIN) TABS Take 1 tablet by mouth daily.   ondansetron  (ZOFRAN ) 8 MG tablet Take 1 tab (8 mg) by mouth every 8 hrs as needed for nausea/vomiting. Start third day after doxorubicin/cyclophosphamide chemotherapy.   prochlorperazine   (COMPAZINE ) 10 MG tablet TAKE 1 TABLET(10 MG) BY MOUTH EVERY 6 HOURS AS NEEDED FOR NAUSEA OR VOMITING   sulfamethoxazole -trimethoprim  (BACTRIM  DS) 800-160 MG tablet Take 1 tablet by mouth 2 (two) times daily.   traMADol (ULTRAM) 50 MG tablet Take 50 mg by mouth every 6 (six) hours as needed.   traZODone  (DESYREL ) 100 MG tablet Take 200 mg by mouth at bedtime.   VITAMIN D  PO Place 1 Dose under the tongue daily. drops   No facility-administered encounter medications on file as of 09/23/2023.     Today's Vitals   09/23/23 1230 09/23/23 1247  BP: 104/60   Pulse: 60   Resp: 17   Temp: 98.2 F (36.8 C)   SpO2: 98%   Weight: 143 lb 11.2 oz (65.2 kg)   PainSc:  3    Body mass index is 24.67 kg/m.   ECOG PERFORMANCE STATUS: 1 - Symptomatic but completely ambulatory  PHYSICAL EXAM GENERAL:alert, no distress and comfortable SKIN: no rash  EYES: sclera clear NECK: without mass LYMPH:  no palpable cervical or supraclavicular lymphadenopathy  LUNGS: clear with normal breathing effort HEART: regular rate & rhythm, no lower extremity edema ABDOMEN: abdomen soft, non-tender and normal bowel sounds  NEURO: alert & oriented x 3 with fluent speech, no focal motor/sensory deficits Breast exam: s/p bilateral mastectomy, incisions healed. Mild R ax erythema PAC without erythema, small opening at lateral incision  CBC    Latest Ref Rng & Units 09/23/2023   11:21 AM 08/15/2023   11:30 AM 07/18/2023   10:39 AM  CBC  WBC 4.0 - 10.5 K/uL 5.5  6.5  4.9   Hemoglobin 12.0 - 15.0 g/dL 88.2  86.6  86.6   Hematocrit 36.0 - 46.0 % 33.9  40.2  39.9   Platelets 150 - 400 K/uL 257  233  263       CMP     Latest Ref Rng & Units 09/23/2023   11:21 AM 08/15/2023   11:30 AM 07/18/2023   10:39 AM  CMP  Glucose 70 - 99 mg/dL 92  888  894   BUN 8 - 23 mg/dL 18  11  12    Creatinine 0.44 - 1.00 mg/dL 9.16  9.04  9.06   Sodium 135 - 145 mmol/L 134  139  139   Potassium 3.5 - 5.1 mmol/L 4.3  4.6  4.2    Chloride 98 - 111 mmol/L 101  105  106   CO2 22 - 32 mmol/L 27  27  29    Calcium  8.9 - 10.3 mg/dL 9.8  9.7  89.9   Total Protein 6.5 - 8.1 g/dL 7.2   7.7   Total Bilirubin 0.0 - 1.2 mg/dL 0.5   0.6   Alkaline Phos 38 - 126 U/L 73   59   AST 15 - 41 U/L 19   19   ALT 0 - 44 U/L 26   17       ASSESSMENT & PLAN: 68 yo post hysterectomy female with    Malignant neoplasm of central portion of left breast in female, estrogen receptor negative, Ki67 80% -cT1bN0M0, stage IB, ER-/PR-/HER2- -breast MRI 09/11/22 detected a non-mass enhancement in L breast but it was felt to be a blood vessel when biopsy was attempted.  A close follow-up breast MRI 12/14/22 showed persistent linear non mass enhancement in the lateral posterior left breast, bx was benign and concordant.  -breast MRI 06/21/2023 showed a 1.0 cm of non mass enhancement/possible enhancing mass with progressive kinetics which is suspicious for malignancy, biopsy showed grade 2 invasive ductal carcinoma, triple negative.  The area measures 0.8 cm on ultrasound. - She underwent port placement with a bilateral mastectomy with L SLNB Aug 22, 2023. -Surgical path showed a grade 3, 2.8 cm triple negative left breast adeno carcinoma, negative margins, all 6 lymph nodes were negative. -Staging CT CAP/bone scan negative, baseline echo is normal -Ms. Buonocore has recovered well and started PT soon after surgery, has very good PS. Labs are unremarkable -The plan was to begin adjuvant [facility] today, however, her recovery has been interrupted by R axillary cellulitis and port incision opening. Reviewed risk/benefit of proceeding with chemo and all agree to postpone 1 week -She will complete doxy (prescribed by surgical PA) and hold PT this week to avoid tension/stress at the port site. The incision was reinforced with steri strips and pt will also monitor at home -Case reviewed with Dr. Ebbie who has f/up 6/26 as scheduled -We will see her back next week to  re-evaluate for cycle 1. - Pt seen with Dr. Lanny  Malignant neoplasm of central portion of right breast, stage 1, pT1b, cN0 IDC, grade 2 -Initially diagnosed with DCIS 09/10/18.  Lumpectomy 10/02/18 showed invasive ductal carcinoma. S/p radiation 11/06/18 - 12/25/18.  -Took exemestane  01/2019 - 03/2021, discontinued due to osteoporosis and small benefit. -Mammo 03/2022 was benign, breast MRI 09/11/22 detected a L breast non mass enhancement and incidental opacity in the R lung.  -Follow up CXR image is clear to me, report notes the MRI finding is not well  visualized. She has no respiratory symptoms, non smoker. We decided to hold on CT chest.  -She went back for MRI guided Bx of L br on 09/14/22 but the area appeared more like a vessel, and bx was not done. Short term MRI 12/14/22 showed persistent linear non mass enhancement in the lateral posterior left breast, bx was benign and concordant.   H/o Left breast stage T2 N0 IDC, ER+/PR+/Her2-  -s/p lumpectomy 03/2007. Oncotype RS 20, low risk.  -Took antiestrogens from 07/2007 - 09/2018 (Arimidex , tamoxifen , and raloxifene ).   BRCA2+ -found to be BRCA2+ at time of first breast cancer. Bilateral mastectomies not felt to be necessary at that time, but she did undergo TAH w/ BSO 10/2007. -She has adult 2 sons, both tested positive and followed by oncologists where they live -We continue to discuss pancreatic screening and I previously discussed with her GI Dr. Legrand.  -A maternal first cousin who was BRCA+ had pancreatic cancer.  -Guidelines/data are not definitive but recommends screening if BRCA2+ with 1 first degree relative or 2 non-first degree relatives -CT CAP for breast cancer staging showed normal pancreas   Osteoporosis -most recent DEXA 03/23/21 showed T-score -2.9 at right hip. Though density in lumbar spine worsened some, her hip slightly improved from -3.1 in 03/2020. -she was previously on fosamax for many years then prolia injections for one  year.  Currently on a treatment holiday -follows with Dr. Melodye at [facility].   Health maintenance  -I encouraged her to continue healthy active lifestyle and risk reducing behaviors.  -Established with new PCP Dr. Geofm PCP in August 2024. I reviewed those labs      PLAN: -Labs reviewed -Post op course complicated by R axillary cellulitis and port incision opening, followed by surgery will complete abx, port site reinforced with steri strips.  -F/up Dr. Ebbie 6/26 as scheduled -Postpone chemo 1 week to complete antibiotics and further would healing -F/up next week with C1 AC, gcsf on day 3, no lab needed -Pt seen with Dr. Lanny   Orders Placed This Encounter  Procedures   CBC with Differential (Cancer Center Only)    Standing Status:   Future    Expected Date:   09/30/2023    Expiration Date:   09/29/2024   CMP (Cancer Center only)    Standing Status:   Future    Expected Date:   09/30/2023    Expiration Date:   09/29/2024      All questions were answered. The patient knows to call the clinic with any problems, questions or concerns. No barriers to learning were detected.   Shemeca Lukasik K Shealee Yordy, NP 09/23/2023  Addendum I have seen the patient, examined her. I agree with the assessment and and plan and have edited the notes.   Patient is clinically doing well, however she is still recovering from her recently treated cellulitis at axillary incision, and port incision is still slightly open.  Will postpone her chemo for week, to allow her better recovery.  She is agreeable.  Onita Lanny  09/23/2023

## 2023-09-23 NOTE — Progress Notes (Signed)
 Patient here for port flush and labs for the first time. Note in chart by Donnice Bury, MD says I pulled the line back to be in the distal vena cava. Nurse educator, charge nurse, and provider made aware and said okay to access and get labs and Stat Xray ordered. Small tear over port site, patient was assessed and steri strips applied with transparent sensitive dressing and gauze. Patient sent to Radiology.

## 2023-09-24 ENCOUNTER — Ambulatory Visit: Admitting: Rehabilitation

## 2023-09-24 ENCOUNTER — Telehealth: Payer: Self-pay | Admitting: Hematology

## 2023-09-24 NOTE — Telephone Encounter (Signed)
 Scheduled appointments per WQ. Talked with the patient and she is aware of the made appointments.

## 2023-09-25 ENCOUNTER — Inpatient Hospital Stay

## 2023-09-26 ENCOUNTER — Encounter: Admitting: Rehabilitation

## 2023-09-30 ENCOUNTER — Inpatient Hospital Stay

## 2023-09-30 ENCOUNTER — Encounter: Payer: Self-pay | Admitting: Nurse Practitioner

## 2023-09-30 ENCOUNTER — Inpatient Hospital Stay (HOSPITAL_BASED_OUTPATIENT_CLINIC_OR_DEPARTMENT_OTHER): Admitting: Nurse Practitioner

## 2023-09-30 ENCOUNTER — Ambulatory Visit

## 2023-09-30 VITALS — BP 119/69 | HR 71 | Temp 98.3°F | Resp 18

## 2023-09-30 DIAGNOSIS — C50912 Malignant neoplasm of unspecified site of left female breast: Secondary | ICD-10-CM

## 2023-09-30 DIAGNOSIS — Z5111 Encounter for antineoplastic chemotherapy: Secondary | ICD-10-CM | POA: Diagnosis not present

## 2023-09-30 DIAGNOSIS — Z1501 Genetic susceptibility to malignant neoplasm of breast: Secondary | ICD-10-CM | POA: Diagnosis not present

## 2023-09-30 LAB — CBC WITH DIFFERENTIAL (CANCER CENTER ONLY)
Abs Immature Granulocytes: 0.02 10*3/uL (ref 0.00–0.07)
Basophils Absolute: 0.1 10*3/uL (ref 0.0–0.1)
Basophils Relative: 1 %
Eosinophils Absolute: 0.4 10*3/uL (ref 0.0–0.5)
Eosinophils Relative: 7 %
HCT: 33.9 % — ABNORMAL LOW (ref 36.0–46.0)
Hemoglobin: 11.4 g/dL — ABNORMAL LOW (ref 12.0–15.0)
Immature Granulocytes: 0 %
Lymphocytes Relative: 21 %
Lymphs Abs: 1.2 10*3/uL (ref 0.7–4.0)
MCH: 28.8 pg (ref 26.0–34.0)
MCHC: 33.6 g/dL (ref 30.0–36.0)
MCV: 85.6 fL (ref 80.0–100.0)
Monocytes Absolute: 0.4 10*3/uL (ref 0.1–1.0)
Monocytes Relative: 7 %
Neutro Abs: 3.6 10*3/uL (ref 1.7–7.7)
Neutrophils Relative %: 64 %
Platelet Count: 236 10*3/uL (ref 150–400)
RBC: 3.96 MIL/uL (ref 3.87–5.11)
RDW: 12.6 % (ref 11.5–15.5)
WBC Count: 5.7 10*3/uL (ref 4.0–10.5)
nRBC: 0 % (ref 0.0–0.2)

## 2023-09-30 LAB — CMP (CANCER CENTER ONLY)
ALT: 21 U/L (ref 0–44)
AST: 19 U/L (ref 15–41)
Albumin: 4.2 g/dL (ref 3.5–5.0)
Alkaline Phosphatase: 63 U/L (ref 38–126)
Anion gap: 7 (ref 5–15)
BUN: 17 mg/dL (ref 8–23)
CO2: 27 mmol/L (ref 22–32)
Calcium: 9.8 mg/dL (ref 8.9–10.3)
Chloride: 105 mmol/L (ref 98–111)
Creatinine: 0.83 mg/dL (ref 0.44–1.00)
GFR, Estimated: >60 mL/min (ref >60)
Glucose, Bld: 93 mg/dL (ref 70–99)
Potassium: 3.9 mmol/L (ref 3.5–5.1)
Sodium: 139 mmol/L (ref 135–145)
Total Bilirubin: 0.4 mg/dL (ref 0.0–1.2)
Total Protein: 6.9 g/dL (ref 6.5–8.1)

## 2023-09-30 MED ORDER — SODIUM CHLORIDE 0.9 % IV SOLN
600.0000 mg/m2 | Freq: Once | INTRAVENOUS | Status: AC
  Administered 2023-09-30: 1000 mg via INTRAVENOUS
  Filled 2023-09-30: qty 50

## 2023-09-30 MED ORDER — PALONOSETRON HCL INJECTION 0.25 MG/5ML
0.2500 mg | Freq: Once | INTRAVENOUS | Status: AC
  Administered 2023-09-30: 0.25 mg via INTRAVENOUS
  Filled 2023-09-30: qty 5

## 2023-09-30 MED ORDER — APREPITANT 130 MG/18ML IV EMUL
130.0000 mg | Freq: Once | INTRAVENOUS | Status: AC
  Administered 2023-09-30: 130 mg via INTRAVENOUS
  Filled 2023-09-30: qty 18

## 2023-09-30 MED ORDER — HEPARIN SOD (PORK) LOCK FLUSH 100 UNIT/ML IV SOLN: 500.0000 [IU] | Freq: Once | INTRAVENOUS | Status: DC | PRN

## 2023-09-30 MED ORDER — SODIUM CHLORIDE 0.9% FLUSH: 10.0000 mL | INTRAVENOUS | Status: DC | PRN

## 2023-09-30 MED ORDER — DEXAMETHASONE SODIUM PHOSPHATE 10 MG/ML IJ SOLN
10.0000 mg | Freq: Once | INTRAMUSCULAR | Status: AC
  Administered 2023-09-30: 10 mg via INTRAVENOUS
  Filled 2023-09-30: qty 1

## 2023-09-30 MED ORDER — SODIUM CHLORIDE 0.9 % IV SOLN: INTRAVENOUS | Status: DC

## 2023-09-30 MED ORDER — DOXORUBICIN HCL CHEMO IV INJECTION 2 MG/ML
60.0000 mg/m2 | Freq: Once | INTRAVENOUS | Status: AC
  Administered 2023-09-30: 104 mg via INTRAVENOUS
  Filled 2023-09-30: qty 52

## 2023-09-30 NOTE — Progress Notes (Signed)
 [facility]   [phone] [phone]    Patient Care Team: Geofm Glade PARAS, MD as PCP - General (Internal Medicine) Tasia Lung, MD as Consulting Physician (Psychiatry) Melodi Lerner, MD as Consulting Physician (Orthopedic Surgery) Barbette Knock, MD as Consulting Physician (Obstetrics and Gynecology) Shannon Agent, MD as Consulting Physician (Radiation Oncology) Lanny Callander, MD as Consulting Physician (Hematology) [facility] as Consulting Physician (Radiology) Lita Lye, OD as Referring Physician (Optometry) Ebbie Cough, MD as Consulting Physician (General Surgery) Glean Stephane BROCKS, RN (Inactive) as Oncology Nurse Navigator Tyree Nanetta SAILOR, RN as Oncology Nurse Navigator   CHIEF COMPLAINT: Follow-up BRCA2+ triple negative left breast cancer  Oncology History  Breast cancer, BRCA2 positive, left (HCC)  04/15/2017 Initial Diagnosis   Breast cancer, BRCA2 positive, left (HCC)   09/30/2023 -  Chemotherapy   Patient is on Treatment Plan : BREAST DOSE DENSE AC q14d / PACLitaxel q7d     Malignant neoplasm of central portion of right breast in female, estrogen receptor positive (HCC)  09/12/2018 Initial Diagnosis   Ductal carcinoma in situ (DCIS) of right breast   09/17/2018 Cancer Staging   Staging form: Breast, AJCC 8th Edition - Clinical: Stage 0 (cTis (DCIS), cN0, cM0, ER+, PR+, HER2: Not Assessed) - Signed by Crawford Morna Pickle, NP on 09/17/2018   10/02/2018 Cancer Staging   Staging form: Breast, AJCC 8th Edition - Pathologic stage from 10/02/2018: Stage IA (pT1b, pN0, cM0, G2, ER+, PR+, HER2-) - Signed by Crawford Morna Pickle, NP on 10/15/2018   Malignant neoplasm of central portion of left breast in female, estrogen receptor negative (HCC)  07/15/2023 Cancer Staging   Staging form: Breast, AJCC 8th Edition - Clinical stage from 07/15/2023: Stage IB (cT1b, cN0, cM0, G2, ER-, PR-, HER2-) - Signed by  Lanny Callander, MD on 07/17/2023 Stage prefix: Initial diagnosis Histologic grading system: 3 grade system   07/17/2023 Initial Diagnosis   Malignant neoplasm of central portion of left breast in female, estrogen receptor negative (HCC)      CURRENT THERAPY: To start adjuvant chemotherapy today 09/30/2023  INTERVAL HISTORY Mrs. [name] returns for reevaluation prior to starting adjuvant chemo, cycle 1 AC held last week for open port incision and resolving cellulitis at the right mastectomy site.  Saw surgeon Dr. Ebbie last week who okayed her to begin chemo. Healing well. Denies fever/chills or any other concerns.   ROS  All other systems reviewed and negative   Past Medical History:  Diagnosis Date   Anxiety    Anxiety disorder    BRCA2 positive 07/14/2012   Cancer (HCC) 2008   left breast. Right Breast Cancer in 2020   Dyslipidemia    Fall    Goiter    History of radiation therapy    Hypothyroidism    Osteoporosis      Past Surgical History:  Procedure Laterality Date   ABDOMINAL HYSTERECTOMY  2009   with BSO   BREAST LUMPECTOMY  [id number], 1991   BREAST LUMPECTOMY WITH RADIOACTIVE SEED LOCALIZATION Right 10/02/2018   Procedure: RIGHT BREAST LUMPECTOMY WITH RADIOACTIVE SEED LOCALIZATION;  Surgeon: Curvin Deward MOULD, MD;  Location: [facility];  Service: General;  Laterality: Right;   COLONOSCOPY N/A 12/13/2015   Procedure: COLONOSCOPY;  Surgeon: Gladis MARLA Louder, MD;  Location: [facility];  Service: Endoscopy;  Laterality: N/A;   COLONOSCOPY  2023   FRACTURE SURGERY  2015   left hip   HARDWARE REMOVAL Left 07/08/2015  Procedure: LEFT HIP HARDWARE REMOVAL;  Surgeon: Dempsey Moan, MD;  Location: [facility];  Service: Orthopedics;  Laterality: Left;   MASTECTOMY W/ SENTINEL NODE BIOPSY Left 08/22/2023   Procedure: MASTECTOMY WITH SENTINEL LYMPH NODE BIOPSY;  Surgeon: Ebbie Cough, MD;  Location: [facility];  Service: General;  Laterality: Left;  LMA w/ PEC BLOCK 150  MINUTES LEFT MASTECTOMY LEFT AXILLARY SENTINEL NODE BIOPSY MAGTRACE AND BLUE DYE INJECTION   PORTACATH PLACEMENT N/A 08/22/2023   Procedure: INSERTION, TUNNELED CENTRAL VENOUS DEVICE, WITH PORT;  Surgeon: Ebbie Cough, MD;  [facility];  Service: General;  Laterality: N/A;  PORT PLACEMENT WITH ULTRASOUND GUIDANCE   TOTAL MASTECTOMY Right 08/22/2023   Procedure: MASTECTOMY, SIMPLE;  Surgeon: Ebbie Cough, MD;  Location: [facility];  Service: General;  Laterality: Right;  RIGHT RISK REDUCING MASTECTOMY     Outpatient Encounter Medications as of 09/30/2023  Medication Sig   atorvastatin  (LIPITOR) 10 MG tablet Take 0.5 tablets (5 mg total) by mouth daily.   clorazepate (TRANXENE) 15 MG tablet Take 7.5-15 mg by mouth See admin instructions. Take 15 mg at bedtime, may take a 7.5 mg dose in the morning as needed anxiety   dexamethasone  (DECADRON ) 4 MG tablet Take 2 tablets (8 mg total) by mouth daily for 3 days. Start the day after doxorubicin/cyclophosphamide chemotherapy. Take with food.   docusate sodium  (COLACE) 250 MG capsule Take 250 mg by mouth daily.   fluconazole  (DIFLUCAN ) 150 MG tablet Take 1 tablet (150 mg total) by mouth daily.   levothyroxine  (SYNTHROID ) 125 MCG tablet Take 1 tablet (125 mcg total) by mouth daily. Take 5 days a  week. (Patient taking differently: Take 125 mcg by mouth every Monday, Tuesday, Wednesday, Thursday, and Friday. Take 5 days a  week.)   lidocaine -prilocaine  (EMLA ) cream Apply to affected area once   methocarbamol  (ROBAXIN ) 500 MG tablet Take 1 tablet (500 mg total) by mouth every 8 (eight) hours as needed (use for muscle cramps/pain).   Multiple Vitamins-Minerals (LUTEIN-ZEAXANTHIN) TABS Take 1 tablet by mouth daily.   ondansetron  (ZOFRAN ) 8 MG tablet Take 1 tab (8 mg) by mouth every 8 hrs as needed for nausea/vomiting. Start third day after doxorubicin/cyclophosphamide chemotherapy.   prochlorperazine  (COMPAZINE ) 10 MG tablet TAKE 1 TABLET(10 MG) BY  MOUTH EVERY 6 HOURS AS NEEDED FOR NAUSEA OR VOMITING   sulfamethoxazole -trimethoprim  (BACTRIM  DS) 800-160 MG tablet Take 1 tablet by mouth 2 (two) times daily.   traMADol (ULTRAM) 50 MG tablet Take 50 mg by mouth every 6 (six) hours as needed.   traZODone  (DESYREL ) 100 MG tablet Take 200 mg by mouth at bedtime.   VITAMIN D  PO Place 1 Dose under the tongue daily. drops   No facility-administered encounter medications on file as of 09/30/2023.     There were no vitals filed for this visit. There is no height or weight on file to calculate BMI.   ECOG PERFORMANCE STATUS: 0 - Asymptomatic  PHYSICAL EXAM GENERAL:alert, no distress and comfortable SKIN: no rash  EYES: sclera clear LUNGS: clear with normal breathing effort HEART: regular rate & rhythm, no lower extremity edema ABDOMEN: abdomen soft, non-tender and normal bowel sounds NEURO: alert & oriented x 3 with fluent speech, no focal motor/sensory deficits Breast exam: s/p bilateral mastectomy, incisions closed, mild generalized erythema at the right chest wall/axilla without obvious rash or warmth  PAC healing well, covered with steri strips, underlying incision appears closed  CBC    Latest Ref Rng & Units 09/23/2023  11:21 AM 08/15/2023   11:30 AM 07/18/2023   10:39 AM  CBC  WBC 4.0 - 10.5 K/uL 5.5  6.5  4.9   Hemoglobin 12.0 - 15.0 g/dL 88.2  86.6  86.6   Hematocrit 36.0 - 46.0 % 33.9  40.2  39.9   Platelets 150 - 400 K/uL 257  233  263       CMP     Latest Ref Rng & Units 09/23/2023   11:21 AM 08/15/2023   11:30 AM 07/18/2023   10:39 AM  CMP  Glucose 70 - 99 mg/dL 92  888  894   BUN 8 - 23 mg/dL 18  11  12    Creatinine 0.44 - 1.00 mg/dL 9.16  9.04  9.06   Sodium 135 - 145 mmol/L 134  139  139   Potassium 3.5 - 5.1 mmol/L 4.3  4.6  4.2   Chloride 98 - 111 mmol/L 101  105  106   CO2 22 - 32 mmol/L 27  27  29    Calcium  8.9 - 10.3 mg/dL 9.8  9.7  89.9   Total Protein 6.5 - 8.1 g/dL 7.2   7.7   Total Bilirubin 0.0 -  1.2 mg/dL 0.5   0.6   Alkaline Phos 38 - 126 U/L 73   59   AST 15 - 41 U/L 19   19   ALT 0 - 44 U/L 26   17       ASSESSMENT & PLAN:68 yo post hysterectomy female with    Malignant neoplasm of central portion of left breast in female, estrogen receptor negative, Ki67 80% -cT1bN0M0, stage IB, ER-/PR-/HER2- -breast MRI 09/11/22 detected a non-mass enhancement in L breast but it was felt to be a blood vessel when biopsy was attempted.  A close follow-up breast MRI 12/14/22 showed persistent linear non mass enhancement in the lateral posterior left breast, bx was benign and concordant.  -breast MRI 06/21/2023 showed a 1.0 cm of non mass enhancement/possible enhancing mass with progressive kinetics which is suspicious for malignancy, biopsy showed grade 2 invasive ductal carcinoma, triple negative.  The area measures 0.8 cm on ultrasound. - She underwent port placement with a bilateral mastectomy with L SLNB Aug 22, 2023. Surgical path showed a grade 3, 2.8 cm triple negative left breast adeno carcinoma, negative margins, all 6 lymph nodes were negative. -Staging CT CAP/bone scan negative, baseline echo is normal - Postop course complicated by cellulitis (R mastectomy site/flap) and open port incision, healing. Cleared to begin chemo by Dr. Ebbie.  -Ms. [name] appears ready to proceed with adjuvant chemo today C1D1 AC, she will return day 3 for injection. We reviewed anti-emetics and return/call precautions at home.  -Will arrange toxicity check next week then f/up and C2 in 2 weeks  Malignant neoplasm of central portion of right breast, stage 1, pT1b, cN0 IDC, grade 2 -Initially diagnosed with DCIS 09/10/18. Lumpectomy 10/02/18 showed invasive ductal carcinoma. S/p radiation 11/06/18 - 12/25/18.  -Took exemestane  01/2019 - 03/2021, discontinued due to osteoporosis and small benefit. -Mammo 03/2022 was benign, breast MRI 09/11/22 detected a L breast non mass enhancement and incidental opacity in the R  lung.  -Follow up CXR image is clear to me, report notes the MRI finding is not well  visualized. She has no respiratory symptoms, non smoker. We decided to hold on CT chest.  -She went back for MRI guided Bx of L br on 09/14/22 but the area appeared more like a vessel,  and bx was not done. Short term MRI 12/14/22 showed persistent linear non mass enhancement in the lateral posterior left breast, bx was benign and concordant.   H/o Left breast stage T2 N0 IDC, ER+/PR+/Her2-  -s/p lumpectomy 03/2007. Oncotype RS 20, low risk.  -Took antiestrogens from 07/2007 - 09/2018 (Arimidex , tamoxifen , and raloxifene ).   BRCA2+ -found to be BRCA2+ at time of first breast cancer. Bilateral mastectomies not felt to be necessary at that time, but she did undergo TAH w/ BSO 10/2007. -She has adult 2 sons, both tested positive and followed by oncologists where they live -We continue to discuss pancreatic screening and I previously discussed with her GI Dr. Legrand.  -A maternal first cousin who was BRCA+ had pancreatic cancer.  -Guidelines/data are not definitive but recommends screening if BRCA2+ with 1 first degree relative or 2 non-first degree relatives -CT CAP for breast cancer staging showed normal pancreas   Osteoporosis -most recent DEXA 03/23/21 showed T-score -2.9 at right hip. Though density in lumbar spine worsened some, her hip slightly improved from -3.1 in 03/2020. -she was previously on fosamax for many years then prolia injections for one year.  Currently on a treatment holiday -follows with [facility] Melodye at [facility].   Health maintenance  -I encouraged her to continue healthy active lifestyle and risk reducing behaviors.  -Established with new PCP Dr. Geofm PCP in August 2024. I reviewed those labs      PLAN: -Labs reviewed -Proceed with C1 AC today, no dose adjustments, GCSF on day 3 -Reviewed home care, symptom management, and return/call precautions -Tox check next week -F/up and cycle 2 in 2  weeks, or sooner if needed    All questions were answered. The patient knows to call the clinic with any problems, questions or concerns. No barriers to learning were detected.   Courtney Bellizzi K Lorin Gawron, NP 09/30/2023

## 2023-09-30 NOTE — Patient Instructions (Signed)
 [facility] - A DEPT OF [facility]  Discharge Instructions: Thank you for choosing [facility] to provide your oncology and hematology care.   If you have a lab appointment with the Cancer Center, please go directly to the Cancer Center and check in at the registration area.   Wear comfortable clothing and clothing appropriate for easy access to any Portacath or PICC line.   We strive to give you quality time with your provider. You may need to reschedule your appointment if you arrive late (15 or more minutes).  Arriving late affects you and other patients whose appointments are after yours.  Also, if you miss three or more appointments without notifying the office, you may be dismissed from the clinic at the provider's discretion.      For prescription refill requests, have your pharmacy contact our office and allow 72 hours for refills to be completed.    Today you received the following chemotherapy and/or immunotherapy agents doxorubicin and cytoxan        To help prevent nausea and vomiting after your treatment, we encourage you to take your nausea medication as directed.  BELOW ARE SYMPTOMS THAT SHOULD BE REPORTED IMMEDIATELY: *FEVER GREATER THAN 100.4 F (38 C) OR HIGHER *CHILLS OR SWEATING *NAUSEA AND VOMITING THAT IS NOT CONTROLLED WITH YOUR NAUSEA MEDICATION *UNUSUAL SHORTNESS OF BREATH *UNUSUAL BRUISING OR BLEEDING *URINARY PROBLEMS (pain or burning when urinating, or frequent urination) *BOWEL PROBLEMS (unusual diarrhea, constipation, pain near the anus) TENDERNESS IN MOUTH AND THROAT WITH OR WITHOUT PRESENCE OF ULCERS (sore throat, sores in mouth, or a toothache) UNUSUAL RASH, SWELLING OR PAIN  UNUSUAL VAGINAL DISCHARGE OR ITCHING   Items with * indicate a potential emergency and should be followed up as soon as possible or go to the Emergency Department if any problems should occur.  Please show the CHEMOTHERAPY ALERT CARD or  IMMUNOTHERAPY ALERT CARD at check-in to the Emergency Department and triage nurse.  Should you have questions after your visit or need to cancel or reschedule your appointment, please contact [facility] - A DEPT OF [facility]  Dept: [phone]  and follow the prompts.  Office hours are 8:00 a.m. to 4:30 p.m. Monday - Friday. Please note that voicemails left after 4:00 p.m. may not be returned until the following business day.  We are closed weekends and major holidays. You have access to a nurse at all times for urgent questions. Please call the main number to the clinic Dept: [phone] and follow the prompts.   For any non-urgent questions, you may also contact your provider using MyChart. We now offer e-Visits for anyone 54 and older to request care online for non-urgent symptoms. For details visit [email or web address].   Also download the MyChart app! Go to the app store, search "MyChart", open the app, select [place], and log in with your MyChart username and password.

## 2023-10-01 ENCOUNTER — Encounter: Payer: Self-pay | Admitting: Hematology

## 2023-10-01 ENCOUNTER — Telehealth: Payer: Self-pay | Admitting: Nurse Practitioner

## 2023-10-01 ENCOUNTER — Other Ambulatory Visit: Payer: Self-pay | Admitting: *Deleted

## 2023-10-01 NOTE — Telephone Encounter (Signed)
-----   Message from Nurse Lavanda RAMAN sent at 09/30/2023 12:47 PM EDT ----- Regarding: Dr. Lanny pt first time [facility] Dr. Lanny pt first time doxorubicin and cytoxan. Tolerated well.

## 2023-10-01 NOTE — Telephone Encounter (Signed)
 Scheduled appointment per 6/30 los. Talked with the patient and she is aware of the made appointments.

## 2023-10-01 NOTE — Telephone Encounter (Signed)
 Called pt to see how she did with her recent treatment. She reports everything went smoothly.  She has had some mild nausea & is taking her nausea meds..  She comes in tomorrow for her pegfilgrastim injection. Reminded of Claritin use.  She knows how to reach us  if needed.

## 2023-10-02 ENCOUNTER — Encounter: Payer: Self-pay | Admitting: Rehabilitation

## 2023-10-02 ENCOUNTER — Ambulatory Visit: Attending: General Surgery | Admitting: Rehabilitation

## 2023-10-02 ENCOUNTER — Inpatient Hospital Stay: Attending: Hematology

## 2023-10-02 VITALS — BP 134/69 | HR 59 | Temp 98.4°F | Resp 16

## 2023-10-02 DIAGNOSIS — Z1732 Human epidermal growth factor receptor 2 negative status: Secondary | ICD-10-CM | POA: Diagnosis not present

## 2023-10-02 DIAGNOSIS — Z9189 Other specified personal risk factors, not elsewhere classified: Secondary | ICD-10-CM | POA: Diagnosis present

## 2023-10-02 DIAGNOSIS — Z79899 Other long term (current) drug therapy: Secondary | ICD-10-CM | POA: Diagnosis not present

## 2023-10-02 DIAGNOSIS — Z923 Personal history of irradiation: Secondary | ICD-10-CM | POA: Diagnosis not present

## 2023-10-02 DIAGNOSIS — Z5189 Encounter for other specified aftercare: Secondary | ICD-10-CM | POA: Insufficient documentation

## 2023-10-02 DIAGNOSIS — R632 Polyphagia: Secondary | ICD-10-CM | POA: Diagnosis not present

## 2023-10-02 DIAGNOSIS — E039 Hypothyroidism, unspecified: Secondary | ICD-10-CM | POA: Diagnosis not present

## 2023-10-02 DIAGNOSIS — Z9013 Acquired absence of bilateral breasts and nipples: Secondary | ICD-10-CM | POA: Insufficient documentation

## 2023-10-02 DIAGNOSIS — C50111 Malignant neoplasm of central portion of right female breast: Secondary | ICD-10-CM | POA: Diagnosis not present

## 2023-10-02 DIAGNOSIS — Z1501 Genetic susceptibility to malignant neoplasm of breast: Secondary | ICD-10-CM | POA: Insufficient documentation

## 2023-10-02 DIAGNOSIS — Z483 Aftercare following surgery for neoplasm: Secondary | ICD-10-CM | POA: Insufficient documentation

## 2023-10-02 DIAGNOSIS — Z1722 Progesterone receptor negative status: Secondary | ICD-10-CM | POA: Insufficient documentation

## 2023-10-02 DIAGNOSIS — R531 Weakness: Secondary | ICD-10-CM | POA: Diagnosis not present

## 2023-10-02 DIAGNOSIS — E785 Hyperlipidemia, unspecified: Secondary | ICD-10-CM | POA: Diagnosis not present

## 2023-10-02 DIAGNOSIS — C50412 Malignant neoplasm of upper-outer quadrant of left female breast: Secondary | ICD-10-CM | POA: Diagnosis present

## 2023-10-02 DIAGNOSIS — Z5111 Encounter for antineoplastic chemotherapy: Secondary | ICD-10-CM | POA: Diagnosis present

## 2023-10-02 DIAGNOSIS — Z170 Estrogen receptor positive status [ER+]: Secondary | ICD-10-CM | POA: Diagnosis present

## 2023-10-02 DIAGNOSIS — C50912 Malignant neoplasm of unspecified site of left female breast: Secondary | ICD-10-CM | POA: Insufficient documentation

## 2023-10-02 DIAGNOSIS — M810 Age-related osteoporosis without current pathological fracture: Secondary | ICD-10-CM | POA: Insufficient documentation

## 2023-10-02 DIAGNOSIS — R110 Nausea: Secondary | ICD-10-CM | POA: Diagnosis not present

## 2023-10-02 DIAGNOSIS — Z171 Estrogen receptor negative status [ER-]: Secondary | ICD-10-CM | POA: Diagnosis not present

## 2023-10-02 DIAGNOSIS — L259 Unspecified contact dermatitis, unspecified cause: Secondary | ICD-10-CM | POA: Insufficient documentation

## 2023-10-02 MED ORDER — PEGFILGRASTIM-FPGK 6 MG/0.6ML ~~LOC~~ SOSY
6.0000 mg | PREFILLED_SYRINGE | Freq: Once | SUBCUTANEOUS | Status: AC
  Administered 2023-10-02: 6 mg via SUBCUTANEOUS
  Filled 2023-10-02: qty 0.6

## 2023-10-02 NOTE — Patient Instructions (Signed)

## 2023-10-02 NOTE — Therapy (Signed)
 OUTPATIENT PHYSICAL THERAPY  UPPER EXTREMITY ONCOLOGY TREATMENT  Patient Name: [name] MRN: [id number] DOB:1955/08/19, 68 y.o., female Today's Date: 10/02/2023  END OF SESSION:  PT End of Session - 10/02/23 1052     Visit Number 4    Number of Visits 8    Date for PT Re-Evaluation 11/13/23    PT Start Time 1005    PT Stop Time 1051    PT Time Calculation (min) 46 min    Activity Tolerance Patient tolerated treatment well    Behavior During Therapy [facility] for tasks assessed/performed           Past Medical History:  Diagnosis Date   Anxiety    Anxiety disorder    BRCA2 positive 07/14/2012   Cancer (HCC) 2008   left breast. Right Breast Cancer in 2020   Dyslipidemia    Fall    Goiter    History of radiation therapy    Hypothyroidism    Osteoporosis    Past Surgical History:  Procedure Laterality Date   ABDOMINAL HYSTERECTOMY  2009   with BSO   BREAST LUMPECTOMY  [id number],[id number], 1991   BREAST LUMPECTOMY WITH RADIOACTIVE SEED LOCALIZATION Right 10/02/2018   Procedure: RIGHT BREAST LUMPECTOMY WITH RADIOACTIVE SEED LOCALIZATION;  Surgeon: Curvin Deward MOULD, MD;  Location: [facility];  Service: General;  Laterality: Right;   COLONOSCOPY N/A 12/13/2015   Procedure: COLONOSCOPY;  Surgeon: Gladis MARLA Louder, MD;  Location: [facility];  Service: Endoscopy;  Laterality: N/A;   COLONOSCOPY  2023   FRACTURE SURGERY  2015   left hip   HARDWARE REMOVAL Left 07/08/2015   Procedure: LEFT HIP HARDWARE REMOVAL;  Surgeon: Dempsey Moan, MD;  Location: [facility];  Service: Orthopedics;  Laterality: Left;   MASTECTOMY W/ SENTINEL NODE BIOPSY Left 08/22/2023   Procedure: MASTECTOMY WITH SENTINEL LYMPH NODE BIOPSY;  Surgeon: Ebbie Cough, MD;  Location: [facility] OR;  Service: General;  Laterality: Left;  LMA w/ PEC BLOCK 150 MINUTES LEFT MASTECTOMY LEFT AXILLARY SENTINEL NODE BIOPSY MAGTRACE AND BLUE DYE INJECTION   PORTACATH PLACEMENT N/A 08/22/2023   Procedure: INSERTION,  TUNNELED CENTRAL VENOUS DEVICE, WITH PORT;  Surgeon: Ebbie Cough, MD;  Location: [facility] OR;  Service: General;  Laterality: N/A;  PORT PLACEMENT WITH ULTRASOUND GUIDANCE   TOTAL MASTECTOMY Right 08/22/2023   Procedure: MASTECTOMY, SIMPLE;  Surgeon: Ebbie Cough, MD;  Location: [facility] OR;  Service: General;  Laterality: Right;  RIGHT RISK REDUCING MASTECTOMY   Patient Active Problem List   Diagnosis Date Noted   Port-A-Cath in place 09/23/2023   S/P bilateral mastectomy 08/22/2023   Malignant neoplasm of central portion of left breast in female, estrogen receptor negative (HCC) 07/17/2023   Anxiety 11/19/2022   Osteoporosis - [facility] 11/18/2022   Hypothyroidism 11/18/2022   Hyperlipidemia 11/18/2022   BRCA2 gene mutation positive in female 06/10/2022   Malignant neoplasm of central portion of right breast in female, estrogen receptor positive (HCC) 09/12/2018   Breast cancer, BRCA2 positive, left (HCC) 04/15/2017   Breast cancer screening, high risk patient 04/15/2017   Malignant neoplasm of upper-outer quadrant of left breast in female, estrogen receptor positive (HCC) 01/29/2013     REFERRING PROVIDER: Cough Ebbie, MD  REFERRING DIAG:  Diagnosis  [phone] (ICD-10-CM) - Postoperative state  C50.412,Z17.1 (ICD-10-CM) - Malignant neoplasm of upper-outer quadrant of left breast in female, estrogen receptor negative (HCC)  Z15.01,Z15.09 (ICD-10-CM) - BRCA2 positive    THERAPY DIAG:  Malignant neoplasm of upper-outer quadrant of left  breast in female, estrogen receptor positive (HCC)  Breast cancer, BRCA2 positive, left (HCC)  S/P bilateral mastectomy  At risk for lymphedema  Aftercare following surgery for neoplasm  ONSET DATE: 06/21/23  Rationale for Evaluation and Treatment: Rehabilitation  SUBJECTIVE:                                                                                                                                                                                            SUBJECTIVE STATEMENT: I had my first one on Monday.  I had an injection earlier today.  I haven't done anything since here last.  My port incision opened so I had to wait a week.  I am doing my normal daily things.    PERTINENT HISTORY: Left breast Grade 2 IDC, triple negative +BRCA2.  Bil mastectomy 08/22/23 with 6 negative nodes. Will have chemo AC-P. Hx of 2 other episodes of breast cancer 2008 in left breast and 2020 in Rt breast. Lymph nodes only out on the left, 2 out previously for a total of 8.  Never anything out on the right.  Hx of radiation bilaterally.  Will start chemo in 2 weeks.    PAIN:  Are you having pain? Yes NPRS scale: 2/10 Pain location: across the chest  Pain orientation: Right and Left  PAIN TYPE: aching, burning, and tight Pain description: constant  Aggravating factors: probably from the infection Relieving factors: the stretches are helping and now being on doxycycline  PRECAUTIONS: Left arm lymphedema risk   RED FLAGS: None   WEIGHT BEARING RESTRICTIONS: No  FALLS:  Has patient fallen in last 6 months? No  LIVING ENVIRONMENT: Lives with: lives with their family and lives with their spouse  OCCUPATION: retired   LEISURE: back to tennis, lifting weights, exercising    HAND DOMINANCE: right   PRIOR LEVEL OF FUNCTION: Independent  PATIENT GOALS: get back to normal activity    OBJECTIVE: Note: Objective measures were completed at Evaluation unless otherwise noted.  COGNITION: Overall cognitive status: Within functional limits for tasks assessed   PALPATION: Cording noted in Left axilla and upper arm   OBSERVATIONS / OTHER ASSESSMENTS: wearing a compression bra, incisions covered with steristrips bil with some redness from irritation from prep from surgery.    SENSATION: Some numbness chest and axilla   POSTURE: WNL  UPPER EXTREMITY AROM/PROM:  A/PROM RIGHT   eval  10/02/23  Shoulder extension 65   Shoulder  flexion 120 145  Shoulder abduction 135 140  Shoulder internal rotation 85   Shoulder external rotation 85 85    (Blank rows = not tested)  A/PROM LEFT  eval 10/02/23  Shoulder extension 65 60  Shoulder flexion 122 145  Shoulder abduction 122 135  Shoulder internal rotation 85 85  Shoulder external rotation 82 85    (Blank rows = not tested)   UPPER EXTREMITY STRENGTH:   LYMPHEDEMA ASSESSMENTS:   LANDMARK RIGHT  eval  At axilla    15 cm proximal to olecranon process   10 cm proximal to olecranon process 26.9  Olecranon process 24.5  15 cm proximal to ulnar styloid process   10 cm proximal to ulnar styloid process 19.3  Just proximal to ulnar styloid process 15.8  Across hand at thumb web space 19.6  At base of 2nd digit 6.5  (Blank rows = not tested)  LANDMARK LEFT  eval  At axilla    15 cm proximal to olecranon process   10 cm proximal to olecranon process 27  Olecranon process 25  15 cm proximal to ulnar styloid process   10 cm proximal to ulnar styloid process 19.5  Just proximal to ulnar styloid process 16.1  Across hand at thumb web space 19.6  At base of 2nd digit 6.7  (Blank rows = not tested)   L-DEX LYMPHEDEMA SCREENING: The patient was assessed using the L-Dex machine today to produce a lymphedema index baseline score. The patient will be reassessed on a regular basis (typically every 3 months) to obtain new L-Dex scores. If the score is > 6.5 points away from his/her baseline score indicating onset of subclinical lymphedema, it will be recommended to wear a compression garment for 4 weeks, 12 hours per day and then be reassessed. If the score continues to be > 6.5 points from baseline at reassessment, we will initiate lymphedema treatment. Assessing in this manner has a 95% rate of preventing clinically significant lymphedema.  QUICK DASH SURVEY: 54% limited                                                                                                                                TREATMENT DATE:  10/02/23 Therapeutic Exercises Pulleys into flex x 3 mins and abd x 2 mins  Ball roll up wall into flex x 10, and then bil abd x 5 each returning therapist demo for all Modified downward dog on wall x 5, 5 sec holds Manual Therapy P/ROM to bil shoulders in supine into flex, abd and D2 MFR along cording in Lt axilla and into medial upper arm STM gently to bil pect insertions where pt palpably tight, Lt>Rt while being mindful of bil healing mastectomy incisions Applied cocoa butter to incisions with edu on starting to lotion  09/19/23: Therapeutic Exercises Pulleys into flex x 3 mins and abd x 2 mins  Ball roll up wall into flex x 10, and then bil abd x 5 each returning therapist demo for all Modified downward dog on wall x 5, 5 sec holds Manual Therapy P/ROM to bil shoulders in supine into flex, abd  and D2 MFR along cording in Lt axilla and into medial upper arm STM gently to bil pect insertions where pt palpably tight, Lt>Rt while being mindful of bil healing mastectomy incisions  09/17/23: Manual Therapy P/ROM to bil shoulders in supine into flex, abd and D2 MFR along cording in Lt axilla and into medial upper arm STM gently to bil pect insertions where pt palpably tight, Lt>Rt Self Care Briefly reviewed scap retract from HEP as this was only one she had a question about. Educated her about how this is helping her to regain correct posture. Issued 1/2 gray foam in TG soft for pt to wear at reddened area of Rt axilla where her bra also rubs and further irritates.  During session noted redness at Rt axilla and pt isn't sure if this was from her having a reaction to adhesive, which would be normal for her, so took pictures of bil mastectomy incisions so pt could compare latera today to see if redness is spreading. She was educated to update doctor if redness spreads at all. Also if any odor or other S/S of infection are noted. Also a steri strip  had come off of port incision and there is now a small pinhole exposed so showed pt this with mirror and covered with nonadherent gauze and paper tape before pt left and was instructed to be mindful of this as well. Pt verbalized understanding of all   PATIENT EDUCATION:  Education details: per today's note Person educated: Patient and Spouse Education method: Explanation, Demonstration, Tactile cues, Verbal cues, and Handouts Education comprehension: verbalized understanding and returned demonstration  HOME EXERCISE PROGRAM: Access Code: [email or web address] URL: [email or web address] Date: 09/11/2023 Prepared by: [name]  Exercises - Supine Shoulder Flexion Extension AAROM with Dowel  - 1-2 x daily - 7 x weekly - 10 reps - 5 seconds hold - Supine Chest Stretch with Elbows Bent  - 1 x daily - 7 x weekly - 1 sets - 3 reps - 30-60seconds hold - Seated Scapular Retraction  - 1 x daily - 7 x weekly - 1-3 sets - 10 reps - 2-3 seconds hold - Standing Shoulder Abduction Finger Walk at Wall  - 1 x daily - 7 x weekly - 1-3 sets - 10 reps - 5 seconds hold  ASSESSMENT:  CLINICAL IMPRESSION: Pt is recovered from infection and poor wound healing and has improved AROM since evaluation but is still lacking around 15--20deg of flexion and abduction bilaterally.  She also has poor wound mobility due to hx of radiation and prior treatment.  She will benefit from additional visits due to having to hold with complications.   OBJECTIVE IMPAIRMENTS: decreased activity tolerance, decreased knowledge of condition, decreased knowledge of use of DME, decreased mobility, and decreased ROM.   ACTIVITY LIMITATIONS: carrying, lifting, and reach over head  PARTICIPATION LIMITATIONS: community activity and yard work  PERSONAL FACTORS: 1-2 comorbidities: previous hx of radiation and SLNB are also affecting patient's functional outcome.   REHAB POTENTIAL: Excellent  CLINICAL DECISION MAKING:  Stable/uncomplicated  EVALUATION COMPLEXITY: Low  GOALS: Goals reviewed with patient? Yes  SHORT TERM GOALS: Target date: 09/11/23   Pt will be educated on initial HEP to start for post-op ROM improvements Baseline: Goal status: MET   LONG TERM GOALS: Target date: 11/13/23  Pt will improve bil AROM to flexion and abduction of at least 160 to return to full reach Baseline:  Goal status: INITIAL  2.  Pt will be educated on lymphedema risk reduction  and will have watched the ABC video Baseline:  Goal status: INITIAL  3.  Pt will be educated on exercises to continue during chemotherapy to minimize muscle loss Baseline:  Goal status: INITIAL   PLAN:  PT FREQUENCY: 1x per week   PT DURATION: 4 weeks   PLANNED INTERVENTIONS: 97110-Therapeutic exercises, 97530- Therapeutic activity, 97535- Self Care, [id number]- Manual therapy, Patient/Family education, Balance training, Joint mobilization, Therapeutic exercises, Therapeutic activity, Neuromuscular re-education, Gait training, and Self Care  PLAN FOR NEXT SESSION: Cont bil ROM, Lt cording work and progress AA/A/ROM stretches  Karmen Altamirano, Erica SAUNDERS, PT 10/02/2023, 10:53 AM

## 2023-10-04 ENCOUNTER — Other Ambulatory Visit: Payer: Self-pay | Admitting: Hematology

## 2023-10-04 DIAGNOSIS — C50912 Malignant neoplasm of unspecified site of left female breast: Secondary | ICD-10-CM

## 2023-10-06 NOTE — Progress Notes (Unsigned)
 [facility]   Telephone:([phone]) [phone] [phone]    Patient Care Team: Geofm Glade PARAS, MD as PCP - General (Internal Medicine) Tasia Lung, MD as Consulting Physician (Psychiatry) Melodi Lerner, MD as Consulting Physician (Orthopedic Surgery) Barbette Knock, MD as Consulting Physician (Obstetrics and Gynecology) Shannon Agent, MD as Consulting Physician (Radiation Oncology) Lanny Callander, MD as Consulting Physician (Hematology) [facility] as Consulting Physician (Radiology) Lita Lye, OD as Referring Physician (Optometry) Ebbie Cough, MD as Consulting Physician (General Surgery) Glean Stephane BROCKS, RN (Inactive) as Oncology Nurse Navigator Tyree Nanetta SAILOR, RN as Oncology Nurse Navigator   I connected with [name] on 10/07/23 at  9:00 AM EDT by telephone visit and verified that I am speaking with the correct person using two identifiers.   I discussed the limitations, risks, security and privacy concerns of performing an evaluation and management service by telemedicine and the availability of in-person appointments. I also discussed with the patient that there may be a patient responsible charge related to this service. The patient expressed understanding and agreed to proceed.   Other persons participating in the visit and their role in the encounter: N/A   Patient's location: Home  Provider's location: [facility] office   CHIEF COMPLAINT: Follow up chemo toxicity check  Oncology History  Breast cancer, BRCA2 positive, left (HCC)  04/15/2017 Initial Diagnosis   Breast cancer, BRCA2 positive, left (HCC)   09/30/2023 -  Chemotherapy   Patient is on Treatment Plan : BREAST DOSE DENSE AC q14d / PACLitaxel q7d     Malignant neoplasm of central portion of right breast in female, estrogen receptor positive (HCC)  09/12/2018 Initial Diagnosis   Ductal carcinoma in situ (DCIS) of right breast   09/17/2018 Cancer Staging    Staging form: Breast, AJCC 8th Edition - Clinical: Stage 0 (cTis (DCIS), cN0, cM0, ER+, PR+, HER2: Not Assessed) - Signed by Crawford Morna Pickle, NP on 09/17/2018   10/02/2018 Cancer Staging   Staging form: Breast, AJCC 8th Edition - Pathologic stage from 10/02/2018: Stage IA (pT1b, pN0, cM0, G2, ER+, PR+, HER2-) - Signed by Crawford Morna Pickle, NP on 10/15/2018   Malignant neoplasm of central portion of left breast in female, estrogen receptor negative (HCC)  07/15/2023 Cancer Staging   Staging form: Breast, AJCC 8th Edition - Clinical stage from 07/15/2023: Stage IB (cT1b, cN0, cM0, G2, ER-, PR-, HER2-) - Signed by Lanny Callander, MD on 07/17/2023 Stage prefix: Initial diagnosis Histologic grading system: 3 grade system   07/17/2023 Initial Diagnosis   Malignant neoplasm of central portion of left breast in female, estrogen receptor negative (HCC)      CURRENT THERAPY: Adjuvant chemotherapy s/p cycle 1 AC  INTERVAL HISTORY Mrs. Mangione presents by phone for tox check, s/p C1 AC on 6/30 with GCSF on 7/2.  Beginning on day 3 she had injection, went physical therapy, and played bridge and may have overdone it.  She developed nausea after overeating pizza and remained nauseous for 3 days, meds helped.  On day 3 she took MiraLAX for constipation which also helped.  Periodically she felt weak and tired but continued to do her activities including walking outside. Feels pretty normal today. Completed antibiotics without recurrent issues with the port incision or mastectomy sites.  Denies bone pain with G-CSF.  Denies mucositis, fever, chills, cough, chest pain, dyspnea.  ROS  All other systems reviewed and negative  Past Medical History:  Diagnosis Date  Anxiety    Anxiety disorder    BRCA2 positive 07/14/2012   Cancer (HCC) 2008   left breast. Right Breast Cancer in 2020   Dyslipidemia    Fall    Goiter    History of radiation therapy    Hypothyroidism    Osteoporosis      Past  Surgical History:  Procedure Laterality Date   ABDOMINAL HYSTERECTOMY  2009   with BSO   BREAST LUMPECTOMY  7991,8023, 1991   BREAST LUMPECTOMY WITH RADIOACTIVE SEED LOCALIZATION Right 10/02/2018   Procedure: RIGHT BREAST LUMPECTOMY WITH RADIOACTIVE SEED LOCALIZATION;  Surgeon: Curvin Deward MOULD, MD;  Location: [facility];  Service: General;  Laterality: Right;   COLONOSCOPY N/A 12/13/2015   Procedure: COLONOSCOPY;  Surgeon: Gladis MARLA Louder, MD;  Location: [facility];  Service: Endoscopy;  Laterality: N/A;   COLONOSCOPY  2023   FRACTURE SURGERY  2015   left hip   HARDWARE REMOVAL Left 07/08/2015   Procedure: LEFT HIP HARDWARE REMOVAL;  Surgeon: Dempsey Moan, MD;  Location: [facility];  Service: Orthopedics;  Laterality: Left;   MASTECTOMY W/ SENTINEL NODE BIOPSY Left 08/22/2023   Procedure: MASTECTOMY WITH SENTINEL LYMPH NODE BIOPSY;  Surgeon: Ebbie Cough, MD;  Location: [facility];  Service: General;  Laterality: Left;  LMA w/ PEC BLOCK 150 MINUTES LEFT MASTECTOMY LEFT AXILLARY SENTINEL NODE BIOPSY MAGTRACE AND BLUE DYE INJECTION   PORTACATH PLACEMENT N/A 08/22/2023   Procedure: INSERTION, TUNNELED CENTRAL VENOUS DEVICE, WITH PORT;  Surgeon: Ebbie Cough, MD;  [facility];  Service: General;  Laterality: N/A;  PORT PLACEMENT WITH ULTRASOUND GUIDANCE   TOTAL MASTECTOMY Right 08/22/2023   Procedure: MASTECTOMY, SIMPLE;  Surgeon: Ebbie Cough, MD;  Location: [facility];  Service: General;  Laterality: Right;  RIGHT RISK REDUCING MASTECTOMY     Outpatient Encounter Medications as of 10/07/2023  Medication Sig   atorvastatin  (LIPITOR) 10 MG tablet Take 0.5 tablets (5 mg total) by mouth daily.   clorazepate (TRANXENE) 15 MG tablet Take 7.5-15 mg by mouth See admin instructions. Take 15 mg at bedtime, may take a 7.5 mg dose in the morning as needed anxiety   dexamethasone  (DECADRON ) 4 MG tablet Take 2 tablets (8 mg total) by mouth daily for 3 days. Start the day after  doxorubicin /cyclophosphamide  chemotherapy. Take with food.   docusate sodium  (COLACE) 250 MG capsule Take 250 mg by mouth daily.   fluconazole  (DIFLUCAN ) 150 MG tablet Take 1 tablet (150 mg total) by mouth daily.   levothyroxine  (SYNTHROID ) 125 MCG tablet Take 1 tablet (125 mcg total) by mouth daily. Take 5 days a  week. (Patient taking differently: Take 125 mcg by mouth every Monday, Tuesday, Wednesday, Thursday, and Friday. Take 5 days a  week.)   lidocaine -prilocaine  (EMLA ) cream Apply to affected area once   methocarbamol  (ROBAXIN ) 500 MG tablet Take 1 tablet (500 mg total) by mouth every 8 (eight) hours as needed (use for muscle cramps/pain).   Multiple Vitamins-Minerals (LUTEIN-ZEAXANTHIN) TABS Take 1 tablet by mouth daily.   ondansetron  (ZOFRAN ) 8 MG tablet Take 1 tab (8 mg) by mouth every 8 hrs as needed for nausea/vomiting. Start third day after doxorubicin /cyclophosphamide  chemotherapy.   prochlorperazine  (COMPAZINE ) 10 MG tablet TAKE 1 TABLET(10 MG) BY MOUTH EVERY 6 HOURS AS NEEDED FOR NAUSEA OR VOMITING   sulfamethoxazole -trimethoprim  (BACTRIM  DS) 800-160 MG tablet Take 1 tablet by mouth 2 (two) times daily.   traMADol (ULTRAM) 50 MG tablet Take 50 mg by mouth every 6 (six) hours  as needed.   traZODone  (DESYREL ) 100 MG tablet Take 200 mg by mouth at bedtime.   VITAMIN D  PO Place 1 Dose under the tongue daily. drops   No facility-administered encounter medications on file as of 10/07/2023.     There were no vitals filed for this visit. There is no height or weight on file to calculate BMI.   ECOG PERFORMANCE STATUS: 1 - Symptomatic but completely ambulatory  PHYSICAL EXAM Patient appears well by phone.  Voice is strong, speech is clear.  Mood/affect appear normal for situation.  No cough or conversational dyspnea   LAB DATA No labs for this encounter   ASSESSMENT & PLAN:68 yo post hysterectomy female with    Malignant neoplasm of central portion of left breast in female,  estrogen receptor negative, Ki67 80% -cT1bN0M0, stage IB, ER-/PR-/HER2- -breast MRI 09/11/22 detected a non-mass enhancement in L breast but it was felt to be a blood vessel when biopsy was attempted.  A close follow-up breast MRI 12/14/22 showed persistent linear non mass enhancement in the lateral posterior left breast, bx was benign and concordant.  -breast MRI 06/21/2023 showed a 1.0 cm of non mass enhancement/possible enhancing mass with progressive kinetics which is suspicious for malignancy, biopsy showed grade 2 invasive ductal carcinoma, triple negative.  The area measures 0.8 cm on ultrasound. - S/p port placement and bilateral mastectomy with L SLNB Aug 22, 2023 Viktor[place]). Surgical path showed a grade 3, 2.8 cm triple negative left breast adeno carcinoma, negative margins, all 6 lymph nodes were negative. -Staging CT CAP/bone scan negative, baseline echo is normal - Postop course complicated by cellulitis (R mastectomy site/flap) and open port incision, healing. Cleared to begin chemo by Dr. Ebbie.  -Began adjuvant chemo AC 6/30, tolerating well thus far with mild fatigue, nausea, and constipation. SEs are adequately managed with supportive care at home, does not need to come in for fluids.  She is able to recover and function well with very good performance status at home.  - She will return next week for cycle 2-day 1, or sooner if needed.   Malignant neoplasm of central portion of right breast, stage 1, pT1b, cN0 IDC, grade 2 -Initially diagnosed with DCIS 09/10/18. Lumpectomy 10/02/18 showed invasive ductal carcinoma. S/p radiation 11/06/18 - 12/25/18.  -Took exemestane  01/2019 - 03/2021, discontinued due to osteoporosis and small benefit. -Mammo 03/2022 was benign, breast MRI 09/11/22 detected a L breast non mass enhancement and incidental opacity in the R lung.  -Follow up CXR image is clear to me, report notes the MRI finding is not well  visualized. She has no respiratory symptoms, non  smoker. We decided to hold on CT chest.  -She went back for MRI guided Bx of L br on 09/14/22 but the area appeared more like a vessel, and bx was not done. Short term MRI 12/14/22 showed persistent linear non mass enhancement in the lateral posterior left breast, bx was benign and concordant.   H/o Left breast stage T2 N0 IDC, ER+/PR+/Her2-  -s/p lumpectomy 03/2007. Oncotype RS 20, low risk.  -Took antiestrogens from 07/2007 - 09/2018 (Arimidex , tamoxifen , and raloxifene ).   BRCA2+ -found to be BRCA2+ at time of first breast cancer. Bilateral mastectomies not felt to be necessary at that time, but she did undergo TAH w/ BSO 10/2007. -She has adult 2 sons, both tested positive and followed by oncologists where they live -We continue to discuss pancreatic screening and I previously discussed with her GI Dr. Legrand.  -A  maternal first cousin who was BRCA+ had pancreatic cancer.  -Guidelines/data are not definitive but recommends screening if BRCA2+ with 1 first degree relative or 2 non-first degree relatives -CT CAP for breast cancer staging showed normal pancreas   Osteoporosis -most recent DEXA 03/23/21 showed T-score -2.9 at right hip. Though density in lumbar spine worsened some, her hip slightly improved from -3.1 in 03/2020. -she was previously on fosamax for many years then prolia injections for one year.  Currently on a treatment holiday -follows with Dr. Melodye at [facility].      PLAN: -Currently C1D8 AC, tolerating well with mild fatigue, nausea, constipation -Continue supportive care at home, no need for IVF -Follow-up and C2D1 on 7/14 as scheduled, or sooner if needed  I discussed the assessment and treatment plan with the patient. The patient was provided an opportunity to ask questions and all were answered. The patient agreed with the plan and demonstrated an understanding of the instructions.   The patient was advised to call back or seek an in-person evaluation if the symptoms worsen  or if the condition fails to improve as anticipated. No barriers to learning were detected. The total time spent in the appointment was 13 minutes and more than 50% was on counseling and coordination of care.   Melodee Lupe K Breahna Boylen, NP 10/07/2023

## 2023-10-07 ENCOUNTER — Inpatient Hospital Stay

## 2023-10-07 ENCOUNTER — Other Ambulatory Visit: Payer: Self-pay

## 2023-10-07 ENCOUNTER — Inpatient Hospital Stay: Admitting: Hematology

## 2023-10-07 ENCOUNTER — Inpatient Hospital Stay (HOSPITAL_BASED_OUTPATIENT_CLINIC_OR_DEPARTMENT_OTHER): Admitting: Nurse Practitioner

## 2023-10-07 ENCOUNTER — Encounter: Payer: Self-pay | Admitting: Nurse Practitioner

## 2023-10-07 DIAGNOSIS — C50912 Malignant neoplasm of unspecified site of left female breast: Secondary | ICD-10-CM | POA: Diagnosis not present

## 2023-10-07 DIAGNOSIS — Z1501 Genetic susceptibility to malignant neoplasm of breast: Secondary | ICD-10-CM | POA: Diagnosis not present

## 2023-10-09 ENCOUNTER — Inpatient Hospital Stay

## 2023-10-10 ENCOUNTER — Encounter: Payer: Self-pay | Admitting: Rehabilitation

## 2023-10-10 ENCOUNTER — Ambulatory Visit: Admitting: Rehabilitation

## 2023-10-10 DIAGNOSIS — C50912 Malignant neoplasm of unspecified site of left female breast: Secondary | ICD-10-CM

## 2023-10-10 DIAGNOSIS — Z9013 Acquired absence of bilateral breasts and nipples: Secondary | ICD-10-CM

## 2023-10-10 DIAGNOSIS — Z483 Aftercare following surgery for neoplasm: Secondary | ICD-10-CM

## 2023-10-10 DIAGNOSIS — C50412 Malignant neoplasm of upper-outer quadrant of left female breast: Secondary | ICD-10-CM | POA: Diagnosis not present

## 2023-10-10 DIAGNOSIS — Z9189 Other specified personal risk factors, not elsewhere classified: Secondary | ICD-10-CM

## 2023-10-10 NOTE — Therapy (Signed)
 OUTPATIENT PHYSICAL THERAPY  UPPER EXTREMITY ONCOLOGY TREATMENT  Patient Name: [name] MRN: [id number] DOB:10/17/1955, 68 y.o., female Today's Date: 10/10/2023  END OF SESSION:  PT End of Session - 10/10/23 1212     Visit Number 5    Number of Visits 8    Date for PT Re-Evaluation 11/13/23    PT Start Time 1214    PT Stop Time 1258    PT Time Calculation (min) 44 min    Activity Tolerance Patient tolerated treatment well    Behavior During Therapy [facility] for tasks assessed/performed           Past Medical History:  Diagnosis Date   Anxiety    Anxiety disorder    BRCA2 positive 07/14/2012   Cancer (HCC) 2008   left breast. Right Breast Cancer in 2020   Dyslipidemia    Fall    Goiter    History of radiation therapy    Hypothyroidism    Osteoporosis    Past Surgical History:  Procedure Laterality Date   ABDOMINAL HYSTERECTOMY  2009   with BSO   BREAST LUMPECTOMY  7991,8023, 1991   BREAST LUMPECTOMY WITH RADIOACTIVE SEED LOCALIZATION Right 10/02/2018   Procedure: RIGHT BREAST LUMPECTOMY WITH RADIOACTIVE SEED LOCALIZATION;  Surgeon: Curvin Deward MOULD, MD;  Location: [facility];  Service: General;  Laterality: Right;   COLONOSCOPY N/A 12/13/2015   Procedure: COLONOSCOPY;  Surgeon: Gladis MARLA Louder, MD;  Location: [facility];  Service: Endoscopy;  Laterality: N/A;   COLONOSCOPY  2023   FRACTURE SURGERY  2015   left hip   HARDWARE REMOVAL Left 07/08/2015   Procedure: LEFT HIP HARDWARE REMOVAL;  Surgeon: Dempsey Moan, MD;  Location: [facility];  Service: Orthopedics;  Laterality: Left;   MASTECTOMY W/ SENTINEL NODE BIOPSY Left 08/22/2023   Procedure: MASTECTOMY WITH SENTINEL LYMPH NODE BIOPSY;  Surgeon: Ebbie Cough, MD;  Location: [facility];  Service: General;  Laterality: Left;  LMA w/ PEC BLOCK 150 MINUTES LEFT MASTECTOMY LEFT AXILLARY SENTINEL NODE BIOPSY MAGTRACE AND BLUE DYE INJECTION   PORTACATH PLACEMENT N/A 08/22/2023   Procedure: INSERTION,  TUNNELED CENTRAL VENOUS DEVICE, WITH PORT;  Surgeon: Ebbie Cough, MD;  Location: [facility] OR;  Service: General;  Laterality: N/A;  PORT PLACEMENT WITH ULTRASOUND GUIDANCE   TOTAL MASTECTOMY Right 08/22/2023   Procedure: MASTECTOMY, SIMPLE;  Surgeon: Ebbie Cough, MD;  Location: [facility] OR;  Service: General;  Laterality: Right;  RIGHT RISK REDUCING MASTECTOMY   Patient Active Problem List   Diagnosis Date Noted   Port-A-Cath in place 09/23/2023   S/P bilateral mastectomy 08/22/2023   Malignant neoplasm of central portion of left breast in female, estrogen receptor negative (HCC) 07/17/2023   Anxiety 11/19/2022   Osteoporosis - [facility] 11/18/2022   Hypothyroidism 11/18/2022   Hyperlipidemia 11/18/2022   BRCA2 gene mutation positive in female 06/10/2022   Malignant neoplasm of central portion of right breast in female, estrogen receptor positive (HCC) 09/12/2018   Breast cancer, BRCA2 positive, left (HCC) 04/15/2017   Breast cancer screening, high risk patient 04/15/2017   Malignant neoplasm of upper-outer quadrant of left breast in female, estrogen receptor positive (HCC) 01/29/2013     REFERRING PROVIDER: Cough Ebbie, MD  REFERRING DIAG:  Diagnosis  [phone] (ICD-10-CM) - Postoperative state  C50.412,Z17.1 (ICD-10-CM) - Malignant neoplasm of upper-outer quadrant of left breast in female, estrogen receptor negative (HCC)  Z15.01,Z15.09 (ICD-10-CM) - BRCA2 positive    THERAPY DIAG:  Malignant neoplasm of upper-outer quadrant of left  breast in female, estrogen receptor positive (HCC)  Breast cancer, BRCA2 positive, left (HCC)  S/P bilateral mastectomy  At risk for lymphedema  Aftercare following surgery for neoplasm  ONSET DATE: 06/21/23  Rationale for Evaluation and Treatment: Rehabilitation  SUBJECTIVE:                                                                                                                                                                                            SUBJECTIVE STATEMENT:     PERTINENT HISTORY: Left breast Grade 2 IDC, triple negative +BRCA2.  Bil mastectomy 08/22/23 with 6 negative nodes. Will have chemo AC-P. Hx of 2 other episodes of breast cancer 2008 in left breast and 2020 in Rt breast. Lymph nodes only out on the left, 2 out previously for a total of 8.  Never anything out on the right.  Hx of radiation bilaterally.  Will start chemo in 2 weeks.    PAIN:  Are you having pain? Yes NPRS scale: 2/10 Pain location: across the chest  Pain orientation: Right and Left  PAIN TYPE: aching, burning, and tight Pain description: constant  Aggravating factors: probably from the infection Relieving factors: the stretches are helping and now being on doxycycline  PRECAUTIONS: Left arm lymphedema risk   RED FLAGS: None   WEIGHT BEARING RESTRICTIONS: No  FALLS:  Has patient fallen in last 6 months? No  LIVING ENVIRONMENT: Lives with: lives with their family and lives with their spouse  OCCUPATION: retired   LEISURE: back to tennis, lifting weights, exercising    HAND DOMINANCE: right   PRIOR LEVEL OF FUNCTION: Independent  PATIENT GOALS: get back to normal activity    OBJECTIVE: Note: Objective measures were completed at Evaluation unless otherwise noted.  COGNITION: Overall cognitive status: Within functional limits for tasks assessed   PALPATION: Cording noted in Left axilla and upper arm   OBSERVATIONS / OTHER ASSESSMENTS: wearing a compression bra, incisions covered with steristrips bil with some redness from irritation from prep from surgery.    SENSATION: Some numbness chest and axilla   POSTURE: WNL  UPPER EXTREMITY AROM/PROM:  A/PROM RIGHT   eval  10/02/23 10/10/23  Shoulder extension 65    Shoulder flexion 120 145 140  Shoulder abduction 135 140 140  Shoulder internal rotation 85    Shoulder external rotation 85 85     (Blank rows = not tested)  A/PROM LEFT   eval 10/02/23  10/10/23  Shoulder extension 65 60   Shoulder flexion 122 145 145  Shoulder abduction 122 135 135  Shoulder internal rotation 85 85   Shoulder external rotation 82  85     (Blank rows = not tested)   UPPER EXTREMITY STRENGTH:   LYMPHEDEMA ASSESSMENTS:   LANDMARK RIGHT  eval  At axilla    15 cm proximal to olecranon process   10 cm proximal to olecranon process 26.9  Olecranon process 24.5  15 cm proximal to ulnar styloid process   10 cm proximal to ulnar styloid process 19.3  Just proximal to ulnar styloid process 15.8  Across hand at thumb web space 19.6  At base of 2nd digit 6.5  (Blank rows = not tested)  LANDMARK LEFT  eval  At axilla    15 cm proximal to olecranon process   10 cm proximal to olecranon process 27  Olecranon process 25  15 cm proximal to ulnar styloid process   10 cm proximal to ulnar styloid process 19.5  Just proximal to ulnar styloid process 16.1  Across hand at thumb web space 19.6  At base of 2nd digit 6.7  (Blank rows = not tested)   L-DEX LYMPHEDEMA SCREENING: The patient was assessed using the L-Dex machine today to produce a lymphedema index baseline score. The patient will be reassessed on a regular basis (typically every 3 months) to obtain new L-Dex scores. If the score is > 6.5 points away from his/her baseline score indicating onset of subclinical lymphedema, it will be recommended to wear a compression garment for 4 weeks, 12 hours per day and then be reassessed. If the score continues to be > 6.5 points from baseline at reassessment, we will initiate lymphedema treatment. Assessing in this manner has a 95% rate of preventing clinically significant lymphedema.  QUICK DASH SURVEY: 54% limited                                                                                                                               TREATMENT DATE:  10/10/23 Remeasured AROM Therapeutic Exercises Pulleys into flex x 3 mins and abd x 2 mins  Ball roll  up wall into flex x 10, and then bil abd x 5 each returning therapist demo for all Supine prolonged stretch stretch 2x20  Snow angel x 6 LTR 6 x 3 bil  Alternating flexion x 5  Manual Therapy P/ROM to bil shoulders in supine into flex, abd and D2 MFR along cording in Lt axilla and into medial upper arm Sent note to Lacie with photo about the rash  10/02/23 Therapeutic Exercises Pulleys into flex x 3 mins and abd x 2 mins  Ball roll up wall into flex x 10, and then bil abd x 5 each returning therapist demo for all Modified downward dog on wall x 5, 5 sec holds Manual Therapy P/ROM to bil shoulders in supine into flex, abd and D2 MFR along cording in Lt axilla and into medial upper arm STM gently to bil pect insertions where pt palpably tight, Lt>Rt while being mindful of bil healing mastectomy incisions Applied cocoa butter  to incisions with edu on starting to lotion  09/19/23: Therapeutic Exercises Pulleys into flex x 3 mins and abd x 2 mins  Ball roll up wall into flex x 10, and then bil abd x 5 each returning therapist demo for all Modified downward dog on wall x 5, 5 sec holds Manual Therapy P/ROM to bil shoulders in supine into flex, abd and D2 MFR along cording in Lt axilla and into medial upper arm STM gently to bil pect insertions where pt palpably tight, Lt>Rt while being mindful of bil healing mastectomy incisions   PATIENT EDUCATION:  Education details: per today's note Person educated: Patient and Spouse Education method: Explanation, Demonstration, Tactile cues, Verbal cues, and Handouts Education comprehension: verbalized understanding and returned demonstration  HOME EXERCISE PROGRAM: Access Code: [email or web address] URL: [email or web address] Date: 09/11/2023 Prepared by: Saddie Raw  Exercises - Supine Shoulder Flexion Extension AAROM with Dowel  - 1-2 x daily - 7 x weekly - 10 reps - 5 seconds hold - Supine Chest Stretch with Elbows Bent  - 1 x daily -  7 x weekly - 1 sets - 3 reps - 30-60seconds hold - Seated Scapular Retraction  - 1 x daily - 7 x weekly - 1-3 sets - 10 reps - 2-3 seconds hold - Standing Shoulder Abduction Finger Walk at Wall  - 1 x daily - 7 x weekly - 1-3 sets - 10 reps - 5 seconds hold  ASSESSMENT:  CLINICAL IMPRESSION: Focused on more stretching and AROM today.  Did note a new rash along the chest which may be from vitamin E oil but did send photo to Lacie Burton NP  OBJECTIVE IMPAIRMENTS: decreased activity tolerance, decreased knowledge of condition, decreased knowledge of use of DME, decreased mobility, and decreased ROM.   ACTIVITY LIMITATIONS: carrying, lifting, and reach over head  PARTICIPATION LIMITATIONS: community activity and yard work  PERSONAL FACTORS: 1-2 comorbidities: previous hx of radiation and SLNB are also affecting patient's functional outcome.   REHAB POTENTIAL: Excellent  CLINICAL DECISION MAKING: Stable/uncomplicated  EVALUATION COMPLEXITY: Low  GOALS: Goals reviewed with patient? Yes  SHORT TERM GOALS: Target date: 09/11/23   Pt will be educated on initial HEP to start for post-op ROM improvements Baseline: Goal status: MET   LONG TERM GOALS: Target date: 11/13/23  Pt will improve bil AROM to flexion and abduction of at least 160 to return to full reach Baseline:  Goal status: INITIAL  2.  Pt will be educated on lymphedema risk reduction and will have watched the ABC video Baseline:  Goal status: INITIAL  3.  Pt will be educated on exercises to continue during chemotherapy to minimize muscle loss Baseline:  Goal status: INITIAL   PLAN:  PT FREQUENCY: 1x per week   PT DURATION: 4 weeks   PLANNED INTERVENTIONS: 97110-Therapeutic exercises, 97530- Therapeutic activity, 97535- Self Care, [id number]- Manual therapy, Patient/Family education, Balance training, Joint mobilization, Therapeutic exercises, Therapeutic activity, Neuromuscular re-education, Gait training, and Self  Care  PLAN FOR NEXT SESSION: Cont bil ROM, Lt cording work and progress AA/A/ROM stretches  Jasmyn Picha, Saddie SAUNDERS, PT 10/10/2023, 1:57 PM

## 2023-10-11 ENCOUNTER — Encounter: Payer: Self-pay | Admitting: Nurse Practitioner

## 2023-10-13 NOTE — Progress Notes (Unsigned)
 [facility]   [phone] [phone]    Patient Care Team: Geofm Glade PARAS, MD as PCP - General (Internal Medicine) Tasia Lung, MD as Consulting Physician (Psychiatry) Melodi Lerner, MD as Consulting Physician (Orthopedic Surgery) Barbette Knock, MD as Consulting Physician (Obstetrics and Gynecology) Shannon Agent, MD as Consulting Physician (Radiation Oncology) Lanny Callander, MD as Consulting Physician (Hematology) [facility] as Consulting Physician (Radiology) Lita Lye, OD as Referring Physician (Optometry) Ebbie Cough, MD as Consulting Physician (General Surgery) Glean Stephane BROCKS, RN (Inactive) as Oncology Nurse Navigator Tyree Nanetta SAILOR, RN as Oncology Nurse Navigator   CHIEF COMPLAINT: Follow-up BRCA2 positive triple negative breast cancer  Oncology History  Breast cancer, BRCA2 positive, left (HCC)  04/15/2017 Initial Diagnosis   Breast cancer, BRCA2 positive, left (HCC)   09/30/2023 -  Chemotherapy   Patient is on Treatment Plan : BREAST DOSE DENSE AC q14d / PACLitaxel q7d     Malignant neoplasm of central portion of right breast in female, estrogen receptor positive (HCC)  09/12/2018 Initial Diagnosis   Ductal carcinoma in situ (DCIS) of right breast   09/17/2018 Cancer Staging   Staging form: Breast, AJCC 8th Edition - Clinical: Stage 0 (cTis (DCIS), cN0, cM0, ER+, PR+, HER2: Not Assessed) - Signed by Crawford Morna Pickle, NP on 09/17/2018   10/02/2018 Cancer Staging   Staging form: Breast, AJCC 8th Edition - Pathologic stage from 10/02/2018: Stage IA (pT1b, pN0, cM0, G2, ER+, PR+, HER2-) - Signed by Crawford Morna Pickle, NP on 10/15/2018   Malignant neoplasm of central portion of left breast in female, estrogen receptor negative (HCC)  07/15/2023 Cancer Staging   Staging form: Breast, AJCC 8th Edition - Clinical stage from 07/15/2023: Stage IB (cT1b, cN0, cM0, G2, ER-, PR-, HER2-) - Signed  by Lanny Callander, MD on 07/17/2023 Stage prefix: Initial diagnosis Histologic grading system: 3 grade system   07/17/2023 Initial Diagnosis   Malignant neoplasm of central portion of left breast in female, estrogen receptor negative (HCC)      CURRENT THERAPY: Adjuvant chemotherapy   INTERVAL HISTORY Mrs. Lowdermilk returns for follow up as scheduled for C2 chemo.  She continues to do well with nausea and had a good week off, energy level is adequate.  Bowels moving normally.  She developed a rash from vitamin D  and has stopped using it, itching is mild.  Denies any other new or specific concerns today.  ROS  All other systems reviewed and negative  Past Medical History:  Diagnosis Date   Anxiety    Anxiety disorder    BRCA2 positive 07/14/2012   Cancer (HCC) 2008   left breast. Right Breast Cancer in 2020   Dyslipidemia    Fall    Goiter    History of radiation therapy    Hypothyroidism    Osteoporosis      Past Surgical History:  Procedure Laterality Date   ABDOMINAL HYSTERECTOMY  2009   with BSO   BREAST LUMPECTOMY  [id number], 1991   BREAST LUMPECTOMY WITH RADIOACTIVE SEED LOCALIZATION Right 10/02/2018   Procedure: RIGHT BREAST LUMPECTOMY WITH RADIOACTIVE SEED LOCALIZATION;  Surgeon: Curvin Deward MOULD, MD;  Location: [facility];  Service: General;  Laterality: Right;   COLONOSCOPY N/A 12/13/2015   Procedure: COLONOSCOPY;  Surgeon: Gladis MARLA Louder, MD;  Location: [facility];  Service: Endoscopy;  Laterality: N/A;   COLONOSCOPY  2023   FRACTURE SURGERY  2015   left hip  HARDWARE REMOVAL Left 07/08/2015   Procedure: LEFT HIP HARDWARE REMOVAL;  Surgeon: Dempsey Moan, MD;  Location: [facility];  Service: Orthopedics;  Laterality: Left;   MASTECTOMY W/ SENTINEL NODE BIOPSY Left 08/22/2023   Procedure: MASTECTOMY WITH SENTINEL LYMPH NODE BIOPSY;  Surgeon: Ebbie Cough, MD;  Location: [facility];  Service: General;  Laterality: Left;  LMA w/ PEC BLOCK 150 MINUTES LEFT  MASTECTOMY LEFT AXILLARY SENTINEL NODE BIOPSY MAGTRACE AND BLUE DYE INJECTION   PORTACATH PLACEMENT N/A 08/22/2023   Procedure: INSERTION, TUNNELED CENTRAL VENOUS DEVICE, WITH PORT;  Surgeon: Ebbie Cough, MD;  Location: [facility];  Service: General;  Laterality: N/A;  PORT PLACEMENT WITH ULTRASOUND GUIDANCE   TOTAL MASTECTOMY Right 08/22/2023   Procedure: MASTECTOMY, SIMPLE;  Surgeon: Ebbie Cough, MD;  Location: [facility] OR;  Service: General;  Laterality: Right;  RIGHT RISK REDUCING MASTECTOMY     Outpatient Encounter Medications as of 10/14/2023  Medication Sig   atorvastatin  (LIPITOR) 10 MG tablet Take 0.5 tablets (5 mg total) by mouth daily.   clorazepate (TRANXENE) 15 MG tablet Take 7.5-15 mg by mouth See admin instructions. Take 15 mg at bedtime, may take a 7.5 mg dose in the morning as needed anxiety   dexamethasone  (DECADRON ) 4 MG tablet Take 2 tablets (8 mg total) by mouth daily for 3 days. Start the day after doxorubicin /cyclophosphamide  chemotherapy. Take with food.   docusate sodium  (COLACE) 250 MG capsule Take 250 mg by mouth daily.   fluconazole  (DIFLUCAN ) 150 MG tablet Take 1 tablet (150 mg total) by mouth daily.   levothyroxine  (SYNTHROID ) 125 MCG tablet Take 1 tablet (125 mcg total) by mouth daily. Take 5 days a  week. (Patient taking differently: Take 125 mcg by mouth every Monday, Tuesday, Wednesday, Thursday, and Friday. Take 5 days a  week.)   lidocaine -prilocaine  (EMLA ) cream Apply to affected area once   methocarbamol  (ROBAXIN ) 500 MG tablet Take 1 tablet (500 mg total) by mouth every 8 (eight) hours as needed (use for muscle cramps/pain).   Multiple Vitamins-Minerals (LUTEIN-ZEAXANTHIN) TABS Take 1 tablet by mouth daily.   ondansetron  (ZOFRAN ) 8 MG tablet Take 1 tab (8 mg) by mouth every 8 hrs as needed for nausea/vomiting. Start third day after doxorubicin /cyclophosphamide  chemotherapy.   prochlorperazine  (COMPAZINE ) 10 MG tablet TAKE 1 TABLET(10 MG) BY MOUTH EVERY 6  HOURS AS NEEDED FOR NAUSEA OR VOMITING   sulfamethoxazole -trimethoprim  (BACTRIM  DS) 800-160 MG tablet Take 1 tablet by mouth 2 (two) times daily.   traMADol (ULTRAM) 50 MG tablet Take 50 mg by mouth every 6 (six) hours as needed.   traZODone  (DESYREL ) 100 MG tablet Take 200 mg by mouth at bedtime.   VITAMIN D  PO Place 1 Dose under the tongue daily. drops   No facility-administered encounter medications on file as of 10/14/2023.     Today's Vitals   10/14/23 1010  BP: 120/68  Pulse: 78  Resp: 17  Temp: 98.3 F (36.8 C)  Weight: 143 lb 4.8 oz (65 kg)   Body mass index is 24.6 kg/m.   ECOG PERFORMANCE STATUS: 1 - Symptomatic but completely ambulatory  PHYSICAL EXAM GENERAL:alert, no distress and comfortable SKIN: Mild erythematous rash to the anterior chest wall/mastectomy sites EYES: sclera clear LUNGS: clear with normal breathing effort HEART: regular rate & rhythm, no lower extremity edema NEURO: alert & oriented x 3 with fluent speech, no focal motor/sensory deficits Breast exam: S/p bilateral mastectomy, incisions completely healed PAC incision healed without erythema    CBC  Latest Ref Rng & Units 10/14/2023    9:51 AM 09/30/2023   10:12 AM 09/23/2023   11:21 AM  CBC  WBC 4.0 - 10.5 K/uL 20.6  5.7  5.5   Hemoglobin 12.0 - 15.0 g/dL 89.3  88.5  88.2   Hematocrit 36.0 - 46.0 % 31.0  33.9  33.9   Platelets 150 - 400 K/uL 168  236  257       CMP     Latest Ref Rng & Units 10/14/2023    9:51 AM 09/30/2023   10:12 AM 09/23/2023   11:21 AM  CMP  Glucose 70 - 99 mg/dL 97  93  92   BUN 8 - 23 mg/dL 12  17  18    Creatinine 0.44 - 1.00 mg/dL 9.13  9.16  9.16   Sodium 135 - 145 mmol/L 138  139  134   Potassium 3.5 - 5.1 mmol/L 3.5  3.9  4.3   Chloride 98 - 111 mmol/L 103  105  101   CO2 22 - 32 mmol/L 28  27  27    Calcium  8.9 - 10.3 mg/dL 9.6  9.8  9.8   Total Protein 6.5 - 8.1 g/dL 6.7  6.9  7.2   Total Bilirubin 0.0 - 1.2 mg/dL 0.3  0.4  0.5   Alkaline Phos 38 -  126 U/L 118  63  73   AST 15 - 41 U/L 24  19  19    ALT 0 - 44 U/L 40  21  26       ASSESSMENT & PLAN: 68 yo post hysterectomy female with    Malignant neoplasm of central portion of left breast in female, estrogen receptor negative, Ki67 80% -cT1bN0M0, stage IB, ER-/PR-/HER2- -breast MRI 09/11/22 detected a non-mass enhancement in L breast but it was felt to be a blood vessel when biopsy was attempted.  A close follow-up breast MRI 12/14/22 showed persistent linear non mass enhancement in the lateral posterior left breast, bx was benign and concordant.  -breast MRI 06/21/2023 showed a 1.0 cm of non mass enhancement/possible enhancing mass with progressive kinetics which is suspicious for malignancy, biopsy showed grade 2 invasive ductal carcinoma, triple negative.  The area measures 0.8 cm on ultrasound. - S/p port placement and bilateral mastectomy with L SLNB Aug 22, 2023 [name]). Surgical path showed a grade 3, 2.8 cm triple negative left breast adeno carcinoma, negative margins, all 6 lymph nodes were negative. -Staging CT CAP/bone scan negative, baseline echo is normal - Postop course complicated by cellulitis (R mastectomy site/flap) and open port incision, healing. Cleared to begin chemo by Dr. Ebbie.  - Ms. Grams appears stable, s/p cycle 1 AC with G-CSF 09/30/2023, she tolerated very well overall, mild constipation and diet induced nausea in the first week.  Side effects adequately managed with supportive care at home.  She is able to recover and function well with good performance status  - She developed a mild contact dermatitis from vitamin EE oil and has stopped, she continues topical hydrocortisone or Benadryl for itching.  - Reviewed, adequate to proceed with cycle 2 AC today as scheduled, no dose adjustments, and G-CSF on day 3 - Follow-up and cycle 3 in 2 weeks, or sooner if needed  Malignant neoplasm of central portion of right breast, stage 1, pT1b, cN0 IDC, grade  2 -Initially diagnosed with DCIS 09/10/18. Lumpectomy 10/02/18 showed invasive ductal carcinoma. S/p radiation 11/06/18 - 12/25/18.  -Took exemestane  01/2019 - 03/2021, discontinued due  to osteoporosis and small benefit. -Mammo 03/2022 was benign, breast MRI 09/11/22 detected a L breast non mass enhancement and incidental opacity in the R lung.  -Follow up CXR image is clear to me, report notes the MRI finding is not well  visualized. She has no respiratory symptoms, non smoker. We decided to hold on CT chest.  -She went back for MRI guided Bx of L br on 09/14/22 but the area appeared more like a vessel, and bx was not done. Short term MRI 12/14/22 showed persistent linear non mass enhancement in the lateral posterior left breast, bx was benign and concordant.   H/o Left breast stage T2 N0 IDC, ER+/PR+/Her2-  -s/p lumpectomy 03/2007. Oncotype RS 20, low risk.  -Took antiestrogens from 07/2007 - 09/2018 (Arimidex , tamoxifen , and raloxifene ).   BRCA2+ -found to be BRCA2+ at time of first breast cancer. Bilateral mastectomies not felt to be necessary at that time, but she did undergo TAH w/ BSO 10/2007. -She has adult 2 sons, both tested positive and followed by oncologists where they live -We continue to discuss pancreatic screening and I previously discussed with her GI Dr. Legrand.  -A maternal first cousin who was BRCA+ had pancreatic cancer.  -Guidelines/data are not definitive but recommends screening if BRCA2+ with 1 first degree relative or 2 non-first degree relatives -CT CAP for breast cancer staging showed normal pancreas   Osteoporosis -most recent DEXA 03/23/21 showed T-score -2.9 at right hip. Though density in lumbar spine worsened some, her hip slightly improved from -3.1 in 03/2020. -she was previously on fosamax for many years then prolia injections for one year.  Currently on a treatment holiday -follows with Dr. Melodye at [facility].     PLAN: -Labs reviewed -Proceed with cycle 2 AC, no dose  adjustments, G-CSF on day 3 -Mild contact dermatitis from vitamin E oil, does not appear to be chemo rash or radiation recall, continue symptom management and monitoring -Given prescription for wig -Follow-up and cycle 3 in 2 weeks as scheduled, or sooner if needed   All questions were answered. The patient knows to call the clinic with any problems, questions or concerns. No barriers to learning were detected.   Pyper Olexa K Nyjai Graff, NP 10/14/2023

## 2023-10-14 ENCOUNTER — Encounter: Payer: Self-pay | Admitting: Nurse Practitioner

## 2023-10-14 ENCOUNTER — Inpatient Hospital Stay

## 2023-10-14 ENCOUNTER — Inpatient Hospital Stay (HOSPITAL_BASED_OUTPATIENT_CLINIC_OR_DEPARTMENT_OTHER): Admitting: Nurse Practitioner

## 2023-10-14 VITALS — BP 120/68 | HR 78 | Temp 98.3°F | Resp 17 | Wt 143.3 lb

## 2023-10-14 DIAGNOSIS — C50912 Malignant neoplasm of unspecified site of left female breast: Secondary | ICD-10-CM

## 2023-10-14 DIAGNOSIS — Z1501 Genetic susceptibility to malignant neoplasm of breast: Secondary | ICD-10-CM | POA: Diagnosis not present

## 2023-10-14 DIAGNOSIS — Z5111 Encounter for antineoplastic chemotherapy: Secondary | ICD-10-CM | POA: Diagnosis not present

## 2023-10-14 DIAGNOSIS — Z95828 Presence of other vascular implants and grafts: Secondary | ICD-10-CM

## 2023-10-14 LAB — CMP (CANCER CENTER ONLY)
ALT: 40 U/L (ref 0–44)
AST: 24 U/L (ref 15–41)
Albumin: 4.2 g/dL (ref 3.5–5.0)
Alkaline Phosphatase: 118 U/L (ref 38–126)
Anion gap: 7 (ref 5–15)
BUN: 12 mg/dL (ref 8–23)
CO2: 28 mmol/L (ref 22–32)
Calcium: 9.6 mg/dL (ref 8.9–10.3)
Chloride: 103 mmol/L (ref 98–111)
Creatinine: 0.86 mg/dL (ref 0.44–1.00)
GFR, Estimated: >60 mL/min (ref >60)
Glucose, Bld: 97 mg/dL (ref 70–99)
Potassium: 3.5 mmol/L (ref 3.5–5.1)
Sodium: 138 mmol/L (ref 135–145)
Total Bilirubin: 0.3 mg/dL (ref 0.0–1.2)
Total Protein: 6.7 g/dL (ref 6.5–8.1)

## 2023-10-14 LAB — CBC WITH DIFFERENTIAL (CANCER CENTER ONLY)
Abs Immature Granulocytes: 0.8 K/uL — ABNORMAL HIGH (ref 0.00–0.07)
Band Neutrophils: 13 %
Basophils Absolute: 0.2 K/uL — ABNORMAL HIGH (ref 0.0–0.1)
Basophils Relative: 1 %
Eosinophils Absolute: 0 K/uL (ref 0.0–0.5)
Eosinophils Relative: 0 %
HCT: 31 % — ABNORMAL LOW (ref 36.0–46.0)
Hemoglobin: 10.6 g/dL — ABNORMAL LOW (ref 12.0–15.0)
Lymphocytes Relative: 6 %
Lymphs Abs: 1.2 K/uL (ref 0.7–4.0)
MCH: 29.2 pg (ref 26.0–34.0)
MCHC: 34.2 g/dL (ref 30.0–36.0)
MCV: 85.4 fL (ref 80.0–100.0)
Metamyelocytes Relative: 3 %
Monocytes Absolute: 0.4 K/uL (ref 0.1–1.0)
Monocytes Relative: 2 %
Myelocytes: 1 %
Neutro Abs: 17.9 K/uL — ABNORMAL HIGH (ref 1.7–7.7)
Neutrophils Relative %: 74 %
Platelet Count: 168 K/uL (ref 150–400)
RBC: 3.63 MIL/uL — ABNORMAL LOW (ref 3.87–5.11)
RDW: 12.6 % (ref 11.5–15.5)
Smear Review: NORMAL
WBC Count: 20.6 K/uL — ABNORMAL HIGH (ref 4.0–10.5)
nRBC: 0.2 % (ref 0.0–0.2)

## 2023-10-14 MED ORDER — SODIUM CHLORIDE 0.9 % IV SOLN
600.0000 mg/m2 | Freq: Once | INTRAVENOUS | Status: AC
  Administered 2023-10-14: 1000 mg via INTRAVENOUS
  Filled 2023-10-14: qty 50

## 2023-10-14 MED ORDER — APREPITANT 130 MG/18ML IV EMUL
130.0000 mg | Freq: Once | INTRAVENOUS | Status: AC
  Administered 2023-10-14: 130 mg via INTRAVENOUS
  Filled 2023-10-14: qty 18

## 2023-10-14 MED ORDER — HEPARIN SOD (PORK) LOCK FLUSH 100 UNIT/ML IV SOLN
500.0000 [IU] | Freq: Once | INTRAVENOUS | Status: AC | PRN
  Administered 2023-10-14: 500 [IU]

## 2023-10-14 MED ORDER — SODIUM CHLORIDE 0.9 % IV SOLN: INTRAVENOUS | Status: DC

## 2023-10-14 MED ORDER — SODIUM CHLORIDE 0.9% FLUSH
10.0000 mL | Freq: Once | INTRAVENOUS | Status: AC
  Administered 2023-10-14: 10 mL

## 2023-10-14 MED ORDER — SODIUM CHLORIDE 0.9% FLUSH
10.0000 mL | INTRAVENOUS | Status: DC | PRN
  Administered 2023-10-14: 10 mL

## 2023-10-14 MED ORDER — PALONOSETRON HCL INJECTION 0.25 MG/5ML
0.2500 mg | Freq: Once | INTRAVENOUS | Status: AC
  Administered 2023-10-14: 0.25 mg via INTRAVENOUS
  Filled 2023-10-14: qty 5

## 2023-10-14 MED ORDER — DOXORUBICIN HCL CHEMO IV INJECTION 2 MG/ML
60.0000 mg/m2 | Freq: Once | INTRAVENOUS | Status: AC
  Administered 2023-10-14: 104 mg via INTRAVENOUS
  Filled 2023-10-14: qty 52

## 2023-10-14 MED ORDER — DEXAMETHASONE SODIUM PHOSPHATE 10 MG/ML IJ SOLN
10.0000 mg | Freq: Once | INTRAMUSCULAR | Status: AC
  Administered 2023-10-14: 10 mg via INTRAVENOUS

## 2023-10-15 ENCOUNTER — Telehealth: Payer: Self-pay | Admitting: Hematology

## 2023-10-15 NOTE — Telephone Encounter (Signed)
 Patient aware of upcoming appointment changes

## 2023-10-16 ENCOUNTER — Inpatient Hospital Stay

## 2023-10-16 VITALS — BP 128/58 | HR 63 | Resp 18

## 2023-10-16 DIAGNOSIS — C50912 Malignant neoplasm of unspecified site of left female breast: Secondary | ICD-10-CM

## 2023-10-16 DIAGNOSIS — Z5111 Encounter for antineoplastic chemotherapy: Secondary | ICD-10-CM | POA: Diagnosis not present

## 2023-10-16 MED ORDER — PEGFILGRASTIM-FPGK 6 MG/0.6ML ~~LOC~~ SOSY
6.0000 mg | PREFILLED_SYRINGE | Freq: Once | SUBCUTANEOUS | Status: AC
Start: 2023-10-16 — End: 2023-10-16
  Administered 2023-10-16: 6 mg via SUBCUTANEOUS
  Filled 2023-10-16: qty 0.6

## 2023-10-17 ENCOUNTER — Encounter: Payer: Self-pay | Admitting: Rehabilitation

## 2023-10-17 ENCOUNTER — Ambulatory Visit: Admitting: Rehabilitation

## 2023-10-17 DIAGNOSIS — Z483 Aftercare following surgery for neoplasm: Secondary | ICD-10-CM

## 2023-10-17 DIAGNOSIS — Z170 Estrogen receptor positive status [ER+]: Secondary | ICD-10-CM

## 2023-10-17 DIAGNOSIS — C50912 Malignant neoplasm of unspecified site of left female breast: Secondary | ICD-10-CM

## 2023-10-17 DIAGNOSIS — C50412 Malignant neoplasm of upper-outer quadrant of left female breast: Secondary | ICD-10-CM | POA: Diagnosis not present

## 2023-10-17 DIAGNOSIS — Z9189 Other specified personal risk factors, not elsewhere classified: Secondary | ICD-10-CM

## 2023-10-17 DIAGNOSIS — Z9013 Acquired absence of bilateral breasts and nipples: Secondary | ICD-10-CM

## 2023-10-17 NOTE — Therapy (Signed)
 OUTPATIENT PHYSICAL THERAPY  UPPER EXTREMITY ONCOLOGY TREATMENT  Patient Name: [name] MRN: [id number] DOB:September 14, 1955, 68 y.o., female Today's Date: 10/17/2023  END OF SESSION:  PT End of Session - 10/17/23 1301     Visit Number 6    Number of Visits 8    Date for PT Re-Evaluation 11/13/23    PT Start Time 1304    PT Stop Time 1350    PT Time Calculation (min) 46 min    Activity Tolerance Patient tolerated treatment well    Behavior During Therapy [facility] for tasks assessed/performed           Past Medical History:  Diagnosis Date   Anxiety    Anxiety disorder    BRCA2 positive 07/14/2012   Cancer (HCC) 2008   left breast. Right Breast Cancer in 2020   Dyslipidemia    Fall    Goiter    History of radiation therapy    Hypothyroidism    Osteoporosis    Past Surgical History:  Procedure Laterality Date   ABDOMINAL HYSTERECTOMY  2009   with BSO   BREAST LUMPECTOMY  [id number], 1991   BREAST LUMPECTOMY WITH RADIOACTIVE SEED LOCALIZATION Right 10/02/2018   Procedure: RIGHT BREAST LUMPECTOMY WITH RADIOACTIVE SEED LOCALIZATION;  Surgeon: Curvin Deward MOULD, MD;  Location: [facility];  Service: General;  Laterality: Right;   COLONOSCOPY N/A 12/13/2015   Procedure: COLONOSCOPY;  Surgeon: Gladis MARLA Louder, MD;  Location: [facility];  Service: Endoscopy;  Laterality: N/A;   COLONOSCOPY  2023   FRACTURE SURGERY  2015   left hip   HARDWARE REMOVAL Left 07/08/2015   Procedure: LEFT HIP HARDWARE REMOVAL;  Surgeon: Dempsey Moan, MD;  Location: [facility];  Service: Orthopedics;  Laterality: Left;   MASTECTOMY W/ SENTINEL NODE BIOPSY Left 08/22/2023   Procedure: MASTECTOMY WITH SENTINEL LYMPH NODE BIOPSY;  Surgeon: Ebbie Cough, MD;  Location: [facility] OR;  Service: General;  Laterality: Left;  LMA w/ PEC BLOCK 150 MINUTES LEFT MASTECTOMY LEFT AXILLARY SENTINEL NODE BIOPSY MAGTRACE AND BLUE DYE INJECTION   PORTACATH PLACEMENT N/A 08/22/2023   Procedure: INSERTION,  TUNNELED CENTRAL VENOUS DEVICE, WITH PORT;  Surgeon: Ebbie Cough, MD;  Location: [facility] OR;  Service: General;  Laterality: N/A;  PORT PLACEMENT WITH ULTRASOUND GUIDANCE   TOTAL MASTECTOMY Right 08/22/2023   Procedure: MASTECTOMY, SIMPLE;  Surgeon: Ebbie Cough, MD;  Location: [facility] OR;  Service: General;  Laterality: Right;  RIGHT RISK REDUCING MASTECTOMY   Patient Active Problem List   Diagnosis Date Noted   Port-A-Cath in place 09/23/2023   S/P bilateral mastectomy 08/22/2023   Malignant neoplasm of central portion of left breast in female, estrogen receptor negative (HCC) 07/17/2023   Anxiety 11/19/2022   Osteoporosis - [facility] 11/18/2022   Hypothyroidism 11/18/2022   Hyperlipidemia 11/18/2022   BRCA2 gene mutation positive in female 06/10/2022   Malignant neoplasm of central portion of right breast in female, estrogen receptor positive (HCC) 09/12/2018   Breast cancer, BRCA2 positive, left (HCC) 04/15/2017   Breast cancer screening, high risk patient 04/15/2017   Malignant neoplasm of upper-outer quadrant of left breast in female, estrogen receptor positive (HCC) 01/29/2013     REFERRING PROVIDER: Cough Ebbie, MD  REFERRING DIAG:  Diagnosis  [phone] (ICD-10-CM) - Postoperative state  C50.412,Z17.1 (ICD-10-CM) - Malignant neoplasm of upper-outer quadrant of left breast in female, estrogen receptor negative (HCC)  Z15.01,Z15.09 (ICD-10-CM) - BRCA2 positive    THERAPY DIAG:  Malignant neoplasm of upper-outer quadrant of left  breast in female, estrogen receptor positive (HCC)  Breast cancer, BRCA2 positive, left (HCC)  S/P bilateral mastectomy  At risk for lymphedema  Aftercare following surgery for neoplasm  ONSET DATE: 06/21/23  Rationale for Evaluation and Treatment: Rehabilitation  SUBJECTIVE:                                                                                                                                                                                            SUBJECTIVE STATEMENT:    I got my stick overhead to the ground.  The rash is gone.  The hair is coming out now.    PERTINENT HISTORY: Left breast Grade 2 IDC, triple negative +BRCA2.  Bil mastectomy 08/22/23 with 6 negative nodes. Will have chemo AC-P. Hx of 2 other episodes of breast cancer 2008 in left breast and 2020 in Rt breast. Lymph nodes only out on the left, 2 out previously for a total of 8.  Never anything out on the right.  Hx of radiation bilaterally.  Will start chemo in 2 weeks.    PAIN:  Are you having pain? Yes NPRS scale: 2/10 Pain location: across the chest  Pain orientation: Right and Left  PAIN TYPE: aching, burning, and tight Pain description: constant  Aggravating factors: probably from the infection Relieving factors: the stretches are helping and now being on doxycycline  PRECAUTIONS: Left arm lymphedema risk   RED FLAGS: None   WEIGHT BEARING RESTRICTIONS: No  FALLS:  Has patient fallen in last 6 months? No  LIVING ENVIRONMENT: Lives with: lives with their family and lives with their spouse  OCCUPATION: retired   LEISURE: back to tennis, lifting weights, exercising    HAND DOMINANCE: right   PRIOR LEVEL OF FUNCTION: Independent  PATIENT GOALS: get back to normal activity    OBJECTIVE: Note: Objective measures were completed at Evaluation unless otherwise noted.  COGNITION: Overall cognitive status: Within functional limits for tasks assessed   PALPATION: Cording noted in Left axilla and upper arm   OBSERVATIONS / OTHER ASSESSMENTS: wearing a compression bra, incisions covered with steristrips bil with some redness from irritation from prep from surgery.    SENSATION: Some numbness chest and axilla   POSTURE: WNL  UPPER EXTREMITY AROM/PROM:  A/PROM RIGHT   eval  10/02/23 10/10/23 10/17/23  Shoulder extension 65     Shoulder flexion 120 145 140 160  Shoulder abduction 135 140 140 160  Shoulder internal  rotation 85   Behind the back full  Shoulder external rotation 85 85  Behind the head full    (Blank rows = not tested)  A/PROM LEFT  eval 10/02/23 10/10/23 10/17/23  Shoulder extension 65 60    Shoulder flexion 122 145 145 160  Shoulder abduction 122 135 135 160  Shoulder internal rotation 85 85  Behind the back full  Shoulder external rotation 82 85  Behind the back full     (Blank rows = not tested)   UPPER EXTREMITY STRENGTH:   LYMPHEDEMA ASSESSMENTS:   LANDMARK RIGHT  eval  At axilla    15 cm proximal to olecranon process   10 cm proximal to olecranon process 26.9  Olecranon process 24.5  15 cm proximal to ulnar styloid process   10 cm proximal to ulnar styloid process 19.3  Just proximal to ulnar styloid process 15.8  Across hand at thumb web space 19.6  At base of 2nd digit 6.5  (Blank rows = not tested)  LANDMARK LEFT  eval  At axilla    15 cm proximal to olecranon process   10 cm proximal to olecranon process 27  Olecranon process 25  15 cm proximal to ulnar styloid process   10 cm proximal to ulnar styloid process 19.5  Just proximal to ulnar styloid process 16.1  Across hand at thumb web space 19.6  At base of 2nd digit 6.7  (Blank rows = not tested)   L-DEX LYMPHEDEMA SCREENING: The patient was assessed using the L-Dex machine today to produce a lymphedema index baseline score. The patient will be reassessed on a regular basis (typically every 3 months) to obtain new L-Dex scores. If the score is > 6.5 points away from his/her baseline score indicating onset of subclinical lymphedema, it will be recommended to wear a compression garment for 4 weeks, 12 hours per day and then be reassessed. If the score continues to be > 6.5 points from baseline at reassessment, we will initiate lymphedema treatment. Assessing in this manner has a 95% rate of preventing clinically significant lymphedema.  QUICK DASH SURVEY: 54% limited                                                                                                                                TREATMENT DATE:  10/17/23 Remeasured AROM Therapeutic Exercises Ball roll up wall into flex x 10, and then bil abd x 5 each returning therapist demo for all Yellow band:  Row x 10  Extension x 10  Bil ER x 10  Horizontal abduction x 10  With cueing for all due to initial performance - updated HEP with all of these.  Manual Therapy P/ROM to bil shoulders in supine into flex, abd and D2 - WNL MFR along cording in Lt axilla but not very taut today.   10/02/23 Therapeutic Exercises Pulleys into flex x 3 mins and abd x 2 mins  Ball roll up wall into flex x 10, and then bil abd x 5 each returning therapist demo for all Modified downward dog on wall x 5, 5 sec holds Manual Therapy  P/ROM to bil shoulders in supine into flex, abd and D2 MFR along cording in Lt axilla and into medial upper arm STM gently to bil pect insertions where pt palpably tight, Lt>Rt while being mindful of bil healing mastectomy incisions Applied cocoa butter to incisions with edu on starting to lotion  09/19/23: Therapeutic Exercises Pulleys into flex x 3 mins and abd x 2 mins  Ball roll up wall into flex x 10, and then bil abd x 5 each returning therapist demo for all Modified downward dog on wall x 5, 5 sec holds Manual Therapy P/ROM to bil shoulders in supine into flex, abd and D2 MFR along cording in Lt axilla and into medial upper arm STM gently to bil pect insertions where pt palpably tight, Lt>Rt while being mindful of bil healing mastectomy incisions   PATIENT EDUCATION:  Education details: per today's note Person educated: Patient and Spouse Education method: Explanation, Demonstration, Actor cues, Verbal cues, and Handouts Education comprehension: verbalized understanding and returned demonstration  HOME EXERCISE PROGRAM: Access Code: [email or web address] URL: [email or web address] Date: 09/11/2023 Prepared  by: Saddie Raw  Exercises - Supine Shoulder Flexion Extension AAROM with Dowel  - 1-2 x daily - 7 x weekly - 10 reps - 5 seconds hold - Supine Chest Stretch with Elbows Bent  - 1 x daily - 7 x weekly - 1 sets - 3 reps - 30-60seconds hold - Seated Scapular Retraction  - 1 x daily - 7 x weekly - 1-3 sets - 10 reps - 2-3 seconds hold - Standing Shoulder Abduction Finger Walk at Wall  - 1 x daily - 7 x weekly - 1-3 sets - 10 reps - 5 seconds hold 10/17/23: Lost code: Yellow band, row, extension, bil ER, horizontal abd x 10 each   ASSESSMENT:  CLINICAL IMPRESSION: Pt has not returned to WNL AROM without limitations on the left side due to cording.  1-2 cords noted in axilla but not very taut and not limiting.  Starting band exercise and education.  1 more visit?  OBJECTIVE IMPAIRMENTS: decreased activity tolerance, decreased knowledge of condition, decreased knowledge of use of DME, decreased mobility, and decreased ROM.   ACTIVITY LIMITATIONS: carrying, lifting, and reach over head  PARTICIPATION LIMITATIONS: community activity and yard work  PERSONAL FACTORS: 1-2 comorbidities: previous hx of radiation and SLNB are also affecting patient's functional outcome.   REHAB POTENTIAL: Excellent  CLINICAL DECISION MAKING: Stable/uncomplicated  EVALUATION COMPLEXITY: Low  GOALS: Goals reviewed with patient? Yes  SHORT TERM GOALS: Target date: 09/11/23   Pt will be educated on initial HEP to start for post-op ROM improvements Baseline: Goal status: MET   LONG TERM GOALS: Target date: 11/13/23  Pt will improve bil AROM to flexion and abduction of at least 160 to return to full reach Baseline:  Goal status: MET  2.  Pt will be educated on lymphedema risk reduction and will have watched the ABC video Baseline:  Goal status: INITIAL  3.  Pt will be educated on exercises to continue during chemotherapy to minimize muscle loss Baseline:  Goal status: INITIAL   PLAN:  PT FREQUENCY:  1x per week   PT DURATION: 4 weeks   PLANNED INTERVENTIONS: [id number]-Therapeutic exercises, 97530- Therapeutic activity, 97535- Self Care, [id number]- Manual therapy, Patient/Family education, Balance training, Joint mobilization, Therapeutic exercises, Therapeutic activity, Neuromuscular re-education, Gait training, and Self Care  PLAN FOR NEXT SESSION: Cont bil ROM, Lt cording work and progress AA/A/ROM stretches  Raw Saddie SAUNDERS, PT 10/17/2023, 1:53 PM

## 2023-10-18 ENCOUNTER — Encounter: Payer: Self-pay | Admitting: Hematology

## 2023-10-21 ENCOUNTER — Ambulatory Visit

## 2023-10-21 ENCOUNTER — Other Ambulatory Visit

## 2023-10-21 ENCOUNTER — Ambulatory Visit: Admitting: Hematology

## 2023-10-21 ENCOUNTER — Other Ambulatory Visit: Payer: Self-pay | Admitting: Hematology

## 2023-10-21 DIAGNOSIS — C50912 Malignant neoplasm of unspecified site of left female breast: Secondary | ICD-10-CM

## 2023-10-23 ENCOUNTER — Ambulatory Visit: Admitting: Rehabilitation

## 2023-10-23 ENCOUNTER — Encounter: Payer: Self-pay | Admitting: Rehabilitation

## 2023-10-23 ENCOUNTER — Encounter: Payer: Self-pay | Admitting: Hematology

## 2023-10-23 ENCOUNTER — Ambulatory Visit

## 2023-10-23 DIAGNOSIS — Z9013 Acquired absence of bilateral breasts and nipples: Secondary | ICD-10-CM

## 2023-10-23 DIAGNOSIS — C50912 Malignant neoplasm of unspecified site of left female breast: Secondary | ICD-10-CM

## 2023-10-23 DIAGNOSIS — Z483 Aftercare following surgery for neoplasm: Secondary | ICD-10-CM

## 2023-10-23 DIAGNOSIS — C50412 Malignant neoplasm of upper-outer quadrant of left female breast: Secondary | ICD-10-CM | POA: Diagnosis not present

## 2023-10-23 DIAGNOSIS — Z170 Estrogen receptor positive status [ER+]: Secondary | ICD-10-CM

## 2023-10-23 DIAGNOSIS — Z9189 Other specified personal risk factors, not elsewhere classified: Secondary | ICD-10-CM

## 2023-10-23 NOTE — Therapy (Signed)
 OUTPATIENT PHYSICAL THERAPY  UPPER EXTREMITY ONCOLOGY TREATMENT  Patient Name: [name] MRN: [id number] DOB:22-Jan-1956, 68 y.o., female Today's Date: 10/23/2023  END OF SESSION:  PT End of Session - 10/23/23 0906     Visit Number 7    Number of Visits 8    Date for PT Re-Evaluation 11/13/23    PT Start Time 0908    PT Stop Time 0959    PT Time Calculation (min) 51 min    Activity Tolerance Patient tolerated treatment well    Behavior During Therapy [facility] for tasks assessed/performed            Past Medical History:  Diagnosis Date   Anxiety    Anxiety disorder    BRCA2 positive 07/14/2012   Cancer (HCC) 2008   left breast. Right Breast Cancer in 2020   Dyslipidemia    Fall    Goiter    History of radiation therapy    Hypothyroidism    Osteoporosis    Past Surgical History:  Procedure Laterality Date   ABDOMINAL HYSTERECTOMY  2009   with BSO   BREAST LUMPECTOMY  7991,8023, 1991   BREAST LUMPECTOMY WITH RADIOACTIVE SEED LOCALIZATION Right 10/02/2018   Procedure: RIGHT BREAST LUMPECTOMY WITH RADIOACTIVE SEED LOCALIZATION;  Surgeon: Curvin Deward MOULD, MD;  Location: [facility];  Service: General;  Laterality: Right;   COLONOSCOPY N/A 12/13/2015   Procedure: COLONOSCOPY;  Surgeon: Gladis MARLA Louder, MD;  Location: [facility];  Service: Endoscopy;  Laterality: N/A;   COLONOSCOPY  2023   FRACTURE SURGERY  2015   left hip   HARDWARE REMOVAL Left 07/08/2015   Procedure: LEFT HIP HARDWARE REMOVAL;  Surgeon: Dempsey Moan, MD;  Location: [facility];  Service: Orthopedics;  Laterality: Left;   MASTECTOMY W/ SENTINEL NODE BIOPSY Left 08/22/2023   Procedure: MASTECTOMY WITH SENTINEL LYMPH NODE BIOPSY;  Surgeon: Ebbie Cough, MD;  Location: [facility];  Service: General;  Laterality: Left;  LMA w/ PEC BLOCK 150 MINUTES LEFT MASTECTOMY LEFT AXILLARY SENTINEL NODE BIOPSY MAGTRACE AND BLUE DYE INJECTION   PORTACATH PLACEMENT N/A 08/22/2023   Procedure:  INSERTION, TUNNELED CENTRAL VENOUS DEVICE, WITH PORT;  Surgeon: Ebbie Cough, MD;  Location: [facility];  Service: General;  Laterality: N/A;  PORT PLACEMENT WITH ULTRASOUND GUIDANCE   TOTAL MASTECTOMY Right 08/22/2023   Procedure: MASTECTOMY, SIMPLE;  Surgeon: Ebbie Cough, MD;  Location: [facility] OR;  Service: General;  Laterality: Right;  RIGHT RISK REDUCING MASTECTOMY   Patient Active Problem List   Diagnosis Date Noted   Port-A-Cath in place 09/23/2023   S/P bilateral mastectomy 08/22/2023   Malignant neoplasm of central portion of left breast in female, estrogen receptor negative (HCC) 07/17/2023   Anxiety 11/19/2022   Osteoporosis - [facility] 11/18/2022   Hypothyroidism 11/18/2022   Hyperlipidemia 11/18/2022   BRCA2 gene mutation positive in female 06/10/2022   Malignant neoplasm of central portion of right breast in female, estrogen receptor positive (HCC) 09/12/2018   Breast cancer, BRCA2 positive, left (HCC) 04/15/2017   Breast cancer screening, high risk patient 04/15/2017   Malignant neoplasm of upper-outer quadrant of left breast in female, estrogen receptor positive (HCC) 01/29/2013     REFERRING PROVIDER: Cough Ebbie, MD  REFERRING DIAG:  Diagnosis  [phone] (ICD-10-CM) - Postoperative state  C50.412,Z17.1 (ICD-10-CM) - Malignant neoplasm of upper-outer quadrant of left breast in female, estrogen receptor negative (HCC)  Z15.01,Z15.09 (ICD-10-CM) - BRCA2 positive    THERAPY DIAG:  Malignant neoplasm of upper-outer quadrant of  left breast in female, estrogen receptor positive (HCC)  Breast cancer, BRCA2 positive, left (HCC)  S/P bilateral mastectomy  At risk for lymphedema  Aftercare following surgery for neoplasm  ONSET DATE: 06/21/23  Rationale for Evaluation and Treatment: Rehabilitation  SUBJECTIVE:                                                                                                                                                                                            SUBJECTIVE STATEMENT:     I got my hair off.  I am feeling ready to be done.   PERTINENT HISTORY: Left breast Grade 2 IDC, triple negative +BRCA2.  Bil mastectomy 08/22/23 with 6 negative nodes. Will have chemo AC-P. Hx of 2 other episodes of breast cancer 2008 in left breast and 2020 in Rt breast. Lymph nodes only out on the left, 2 out previously for a total of 8.  Never anything out on the right.  Hx of radiation bilaterally.  Will start chemo in 2 weeks.    PAIN:  Are you having pain? Yes NPRS scale: 2/10 Pain location: across the chest  Pain orientation: Right and Left  PAIN TYPE: aching, burning, and tight Pain description: constant  Aggravating factors: probably from the infection Relieving factors: the stretches are helping and now being on doxycycline  PRECAUTIONS: Left arm lymphedema risk   RED FLAGS: None   WEIGHT BEARING RESTRICTIONS: No  FALLS:  Has patient fallen in last 6 months? No  LIVING ENVIRONMENT: Lives with: lives with their family and lives with their spouse  OCCUPATION: retired   LEISURE: back to tennis, lifting weights, exercising    HAND DOMINANCE: right   PRIOR LEVEL OF FUNCTION: Independent  PATIENT GOALS: get back to normal activity    OBJECTIVE: Note: Objective measures were completed at Evaluation unless otherwise noted.  COGNITION: Overall cognitive status: Within functional limits for tasks assessed   PALPATION: Cording noted in Left axilla and upper arm   OBSERVATIONS / OTHER ASSESSMENTS: wearing a compression bra, incisions covered with steristrips bil with some redness from irritation from prep from surgery.    SENSATION: Some numbness chest and axilla   POSTURE: WNL  UPPER EXTREMITY AROM/PROM:  A/PROM RIGHT   eval  10/02/23 10/10/23 10/17/23  Shoulder extension 65     Shoulder flexion 120 145 140 160  Shoulder abduction 135 140 140 160  Shoulder internal rotation 85   Behind the  back full  Shoulder external rotation 85 85  Behind the head full    (Blank rows = not tested)  A/PROM LEFT   eval 10/02/23 10/10/23 10/17/23  Shoulder extension 65 60    Shoulder flexion 122 145 145 160  Shoulder abduction 122 135 135 160  Shoulder internal rotation 85 85  Behind the back full  Shoulder external rotation 82 85  Behind the back full     (Blank rows = not tested)   UPPER EXTREMITY STRENGTH:   LYMPHEDEMA ASSESSMENTS:   LANDMARK RIGHT  eval  At axilla    15 cm proximal to olecranon process   10 cm proximal to olecranon process 26.9  Olecranon process 24.5  15 cm proximal to ulnar styloid process   10 cm proximal to ulnar styloid process 19.3  Just proximal to ulnar styloid process 15.8  Across hand at thumb web space 19.6  At base of 2nd digit 6.5  (Blank rows = not tested)  LANDMARK LEFT  eval  At axilla    15 cm proximal to olecranon process   10 cm proximal to olecranon process 27  Olecranon process 25  15 cm proximal to ulnar styloid process   10 cm proximal to ulnar styloid process 19.5  Just proximal to ulnar styloid process 16.1  Across hand at thumb web space 19.6  At base of 2nd digit 6.7  (Blank rows = not tested)   L-DEX LYMPHEDEMA SCREENING: The patient was assessed using the L-Dex machine today to produce a lymphedema index baseline score. The patient will be reassessed on a regular basis (typically every 3 months) to obtain new L-Dex scores. If the score is > 6.5 points away from his/her baseline score indicating onset of subclinical lymphedema, it will be recommended to wear a compression garment for 4 weeks, 12 hours per day and then be reassessed. If the score continues to be > 6.5 points from baseline at reassessment, we will initiate lymphedema treatment. Assessing in this manner has a 95% rate of preventing clinically significant lymphedema.  QUICK DASH SURVEY: 54% limited                                                                                                                                TREATMENT DATE:  10/23/23 Therapeutic Exercises Education on strength ABC handout with verbal instruction of each step and performance of  All weighted upper body exercises with 2# x 10 bil.   Performed all wall stretches x 15 Manual Therapy P/ROM to bil shoulders in supine into flex, abd and D2 - WNL MFR along cording in Lt axilla but not very taut today.    10/17/23 Remeasured AROM Therapeutic Exercises Ball roll up wall into flex x 10, and then bil abd x 5 each returning therapist demo for all Yellow band:  Row x 10  Extension x 10  Bil ER x 10  Horizontal abduction x 10  With cueing for all due to initial performance - updated HEP with all of these.  Manual Therapy P/ROM to bil shoulders in supine into flex, abd and D2 - WNL  MFR along cording in Lt axilla but not very taut today.   10/02/23 Therapeutic Exercises Pulleys into flex x 3 mins and abd x 2 mins  Ball roll up wall into flex x 10, and then bil abd x 5 each returning therapist demo for all Modified downward dog on wall x 5, 5 sec holds Manual Therapy P/ROM to bil shoulders in supine into flex, abd and D2 MFR along cording in Lt axilla and into medial upper arm STM gently to bil pect insertions where pt palpably tight, Lt>Rt while being mindful of bil healing mastectomy incisions Applied cocoa butter to incisions with edu on starting to lotion  09/19/23: Therapeutic Exercises Pulleys into flex x 3 mins and abd x 2 mins  Ball roll up wall into flex x 10, and then bil abd x 5 each returning therapist demo for all Modified downward dog on wall x 5, 5 sec holds Manual Therapy P/ROM to bil shoulders in supine into flex, abd and D2 MFR along cording in Lt axilla and into medial upper arm STM gently to bil pect insertions where pt palpably tight, Lt>Rt while being mindful of bil healing mastectomy incisions   PATIENT EDUCATION:  Education details: per  today's note Person educated: Patient and Spouse Education method: Explanation, Demonstration, Actor cues, Verbal cues, and Handouts Education comprehension: verbalized understanding and returned demonstration  HOME EXERCISE PROGRAM: Access Code: [email or web address] URL: [email or web address] Date: 09/11/2023 Prepared by: Saddie Raw  Exercises - Supine Shoulder Flexion Extension AAROM with Dowel  - 1-2 x daily - 7 x weekly - 10 reps - 5 seconds hold - Supine Chest Stretch with Elbows Bent  - 1 x daily - 7 x weekly - 1 sets - 3 reps - 30-60seconds hold - Seated Scapular Retraction  - 1 x daily - 7 x weekly - 1-3 sets - 10 reps - 2-3 seconds hold - Standing Shoulder Abduction Finger Walk at Wall  - 1 x daily - 7 x weekly - 1-3 sets - 10 reps - 5 seconds hold 10/17/23: Lost code: Yellow band, row, extension, bil ER, horizontal abd x 10 each  Strength ABC program education 10/23/23  ASSESSMENT:  CLINICAL IMPRESSION: Pt has met all of her goals.  She has 1-2 cords noted in axilla but not very taut and not limiting.  She is independent with HEP at this time.  She knows to return if needed.  Will continue with [facility] screenings without a true baseline as she goes through chemotherapy.   OBJECTIVE IMPAIRMENTS: decreased activity tolerance, decreased knowledge of condition, decreased knowledge of use of DME, decreased mobility, and decreased ROM.   ACTIVITY LIMITATIONS: carrying, lifting, and reach over head  PARTICIPATION LIMITATIONS: community activity and yard work  PERSONAL FACTORS: 1-2 comorbidities: previous hx of radiation and SLNB are also affecting patient's functional outcome.   REHAB POTENTIAL: Excellent  CLINICAL DECISION MAKING: Stable/uncomplicated  EVALUATION COMPLEXITY: Low  GOALS: Goals reviewed with patient? Yes  SHORT TERM GOALS: Target date: 09/11/23   Pt will be educated on initial HEP to start for post-op ROM improvements Baseline: Goal status: MET   LONG  TERM GOALS: Target date: 11/13/23  Pt will improve bil AROM to flexion and abduction of at least 160 to return to full reach Baseline:  Goal status: MET  2.  Pt will be educated on lymphedema risk reduction and will have watched the ABC video Baseline:  Goal status:MET  3.  Pt will be educated on exercises to continue during chemotherapy  to minimize muscle loss Baseline:  Goal status: IMET   PLAN:  PT FREQUENCY: 1x per week   PT DURATION: 4 weeks   PLANNED INTERVENTIONS: 97110-Therapeutic exercises, 97530- Therapeutic activity, 97535- Self Care, [id number]- Manual therapy, Patient/Family education, Balance training, Joint mobilization, Therapeutic exercises, Therapeutic activity, Neuromuscular re-education, Gait training, and Self Care  PLAN FOR NEXT SESSION: Cont bil ROM, Lt cording work and progress AA/A/ROM stretches  Jerre Diguglielmo R, PT 10/23/2023, 12:46 PM  PHYSICAL THERAPY DISCHARGE SUMMARY  Visits from Start of Care: 7  Current functional level related to goals / functional outcomes: See above   Remaining deficits: Going through chemo and then radiation, lymphedema risk    Education / Equipment: Self care plan  Plan: Patient agrees to discharge.  Patient is being discharged due to meeting the stated rehab goals.

## 2023-10-26 NOTE — Assessment & Plan Note (Signed)
-  [id number], stage IB, ER-/PR-/HER2- -breast MRI 09/11/22 detected a non-mass enhancement in L breast but it was felt to be a blood vessel when biopsy was attempted.  A close follow-up breast MRI 12/14/22 showed persistent linear non mass enhancement in the lateral posterior left breast, bx was benign and concordant.  -breast MRI 06/21/2023 showed a 1.0 cm of non mass enhancement/possible enhancing mass with progressive Kinetics which is suspicious for malignancy, biopsy showed grade 2 invasive ductal carcinoma, triple negative.  The area measures 0.8 cm on ultrasound. - She underwent a bilateral mastectomy in the left lymph node dissection on Aug 22, 2023. -Surgical path showed 2.8 cm triple negative left breast adeno carcinoma, negative margins, all 6 lymph nodes were negative. -I recommend adjuvant chemotherapy AC-T, she started on 09/30/2023

## 2023-10-28 ENCOUNTER — Inpatient Hospital Stay

## 2023-10-28 ENCOUNTER — Inpatient Hospital Stay (HOSPITAL_BASED_OUTPATIENT_CLINIC_OR_DEPARTMENT_OTHER): Admitting: Hematology

## 2023-10-28 VITALS — BP 110/60 | HR 68 | Temp 97.7°F | Resp 15 | Ht 64.0 in | Wt 144.2 lb

## 2023-10-28 DIAGNOSIS — C50912 Malignant neoplasm of unspecified site of left female breast: Secondary | ICD-10-CM | POA: Diagnosis not present

## 2023-10-28 DIAGNOSIS — Z171 Estrogen receptor negative status [ER-]: Secondary | ICD-10-CM

## 2023-10-28 DIAGNOSIS — Z1501 Genetic susceptibility to malignant neoplasm of breast: Secondary | ICD-10-CM

## 2023-10-28 DIAGNOSIS — Z95828 Presence of other vascular implants and grafts: Secondary | ICD-10-CM

## 2023-10-28 DIAGNOSIS — Z5111 Encounter for antineoplastic chemotherapy: Secondary | ICD-10-CM | POA: Diagnosis not present

## 2023-10-28 DIAGNOSIS — I10 Essential (primary) hypertension: Secondary | ICD-10-CM

## 2023-10-28 DIAGNOSIS — C50112 Malignant neoplasm of central portion of left female breast: Secondary | ICD-10-CM | POA: Diagnosis not present

## 2023-10-28 LAB — CBC WITH DIFFERENTIAL (CANCER CENTER ONLY)
Abs Immature Granulocytes: 3.56 K/uL — ABNORMAL HIGH (ref 0.00–0.07)
Basophils Absolute: 0.2 K/uL — ABNORMAL HIGH (ref 0.0–0.1)
Basophils Relative: 1 %
Eosinophils Absolute: 0.1 K/uL (ref 0.0–0.5)
Eosinophils Relative: 0 %
HCT: 29.5 % — ABNORMAL LOW (ref 36.0–46.0)
Hemoglobin: 9.8 g/dL — ABNORMAL LOW (ref 12.0–15.0)
Immature Granulocytes: 16 %
Lymphocytes Relative: 6 %
Lymphs Abs: 1.4 K/uL (ref 0.7–4.0)
MCH: 28.9 pg (ref 26.0–34.0)
MCHC: 33.2 g/dL (ref 30.0–36.0)
MCV: 87 fL (ref 80.0–100.0)
Monocytes Absolute: 1 K/uL (ref 0.1–1.0)
Monocytes Relative: 5 %
Neutro Abs: 15.9 K/uL — ABNORMAL HIGH (ref 1.7–7.7)
Neutrophils Relative %: 72 %
Platelet Count: 188 K/uL (ref 150–400)
RBC: 3.39 MIL/uL — ABNORMAL LOW (ref 3.87–5.11)
RDW: 14.4 % (ref 11.5–15.5)
Smear Review: NORMAL
WBC Count: 22.1 K/uL — ABNORMAL HIGH (ref 4.0–10.5)
nRBC: 0.3 % — ABNORMAL HIGH (ref 0.0–0.2)

## 2023-10-28 LAB — CMP (CANCER CENTER ONLY)
ALT: 19 U/L (ref 0–44)
AST: 15 U/L (ref 15–41)
Albumin: 4.1 g/dL (ref 3.5–5.0)
Alkaline Phosphatase: 129 U/L — ABNORMAL HIGH (ref 38–126)
Anion gap: 6 (ref 5–15)
BUN: 10 mg/dL (ref 8–23)
CO2: 29 mmol/L (ref 22–32)
Calcium: 9.5 mg/dL (ref 8.9–10.3)
Chloride: 106 mmol/L (ref 98–111)
Creatinine: 0.86 mg/dL (ref 0.44–1.00)
GFR, Estimated: >60 mL/min (ref >60)
Glucose, Bld: 104 mg/dL — ABNORMAL HIGH (ref 70–99)
Potassium: 3.8 mmol/L (ref 3.5–5.1)
Sodium: 141 mmol/L (ref 135–145)
Total Bilirubin: 0.3 mg/dL (ref 0.0–1.2)
Total Protein: 6.8 g/dL (ref 6.5–8.1)

## 2023-10-28 MED ORDER — DEXAMETHASONE SODIUM PHOSPHATE 10 MG/ML IJ SOLN
10.0000 mg | Freq: Once | INTRAMUSCULAR | Status: AC
  Administered 2023-10-28: 10 mg via INTRAVENOUS
  Filled 2023-10-28: qty 1

## 2023-10-28 MED ORDER — APREPITANT 130 MG/18ML IV EMUL
130.0000 mg | Freq: Once | INTRAVENOUS | Status: AC
  Administered 2023-10-28: 130 mg via INTRAVENOUS
  Filled 2023-10-28: qty 18

## 2023-10-28 MED ORDER — SODIUM CHLORIDE 0.9 % IV SOLN
600.0000 mg/m2 | Freq: Once | INTRAVENOUS | Status: AC
  Administered 2023-10-28: 1000 mg via INTRAVENOUS
  Filled 2023-10-28: qty 50

## 2023-10-28 MED ORDER — SODIUM CHLORIDE 0.9% FLUSH
10.0000 mL | Freq: Once | INTRAVENOUS | Status: AC
Start: 2023-10-28 — End: 2023-10-28
  Administered 2023-10-28: 10 mL

## 2023-10-28 MED ORDER — SODIUM CHLORIDE 0.9% FLUSH
10.0000 mL | INTRAVENOUS | Status: DC | PRN
Start: 2023-10-28 — End: 2023-10-28

## 2023-10-28 MED ORDER — PALONOSETRON HCL INJECTION 0.25 MG/5ML
0.2500 mg | Freq: Once | INTRAVENOUS | Status: AC
  Administered 2023-10-28: 0.25 mg via INTRAVENOUS
  Filled 2023-10-28: qty 5

## 2023-10-28 MED ORDER — SODIUM CHLORIDE 0.9 % IV SOLN: INTRAVENOUS | Status: DC

## 2023-10-28 MED ORDER — DOXORUBICIN HCL CHEMO IV INJECTION 2 MG/ML
60.0000 mg/m2 | Freq: Once | INTRAVENOUS | Status: AC
  Administered 2023-10-28: 104 mg via INTRAVENOUS
  Filled 2023-10-28: qty 52

## 2023-10-28 MED ORDER — LIDOCAINE-PRILOCAINE 2.5-2.5 % EX CREA: TOPICAL_CREAM | CUTANEOUS | 3 refills | Status: AC

## 2023-10-28 NOTE — Patient Instructions (Signed)
 [facility] - A DEPT OF [facility]  Discharge Instructions: Thank you for choosing [facility] to provide your oncology and hematology care.   If you have a lab appointment with the Cancer Center, please go directly to the Cancer Center and check in at the registration area.   Wear comfortable clothing and clothing appropriate for easy access to any Portacath or PICC line.   We strive to give you quality time with your provider. You may need to reschedule your appointment if you arrive late (15 or more minutes).  Arriving late affects you and other patients whose appointments are after yours.  Also, if you miss three or more appointments without notifying the office, you may be dismissed from the clinic at the provider's discretion.      For prescription refill requests, have your pharmacy contact our office and allow 72 hours for refills to be completed.    Today you received the following chemotherapy and/or immunotherapy agents :  Doxorubicin & Cyclophosphamide      To help prevent nausea and vomiting after your treatment, we encourage you to take your nausea medication as directed.  BELOW ARE SYMPTOMS THAT SHOULD BE REPORTED IMMEDIATELY: *FEVER GREATER THAN 100.4 F (38 C) OR HIGHER *CHILLS OR SWEATING *NAUSEA AND VOMITING THAT IS NOT CONTROLLED WITH YOUR NAUSEA MEDICATION *UNUSUAL SHORTNESS OF BREATH *UNUSUAL BRUISING OR BLEEDING *URINARY PROBLEMS (pain or burning when urinating, or frequent urination) *BOWEL PROBLEMS (unusual diarrhea, constipation, pain near the anus) TENDERNESS IN MOUTH AND THROAT WITH OR WITHOUT PRESENCE OF ULCERS (sore throat, sores in mouth, or a toothache) UNUSUAL RASH, SWELLING OR PAIN  UNUSUAL VAGINAL DISCHARGE OR ITCHING   Items with * indicate a potential emergency and should be followed up as soon as possible or go to the Emergency Department if any problems should occur.  Please show the CHEMOTHERAPY ALERT  CARD or IMMUNOTHERAPY ALERT CARD at check-in to the Emergency Department and triage nurse.  Should you have questions after your visit or need to cancel or reschedule your appointment, please contact [facility] - A DEPT OF [facility]  Dept: [phone]  and follow the prompts.  Office hours are 8:00 a.m. to 4:30 p.m. Monday - Friday. Please note that voicemails left after 4:00 p.m. may not be returned until the following business day.  We are closed weekends and major holidays. You have access to a nurse at all times for urgent questions. Please call the main number to the clinic Dept: [phone] and follow the prompts.   For any non-urgent questions, you may also contact your provider using MyChart. We now offer e-Visits for anyone 60 and older to request care online for non-urgent symptoms. For details visit [email or web address].   Also download the MyChart app! Go to the app store, search "MyChart", open the app, select [facility], and log in with your MyChart username and password.

## 2023-10-28 NOTE — Research (Signed)
 DCP-001: Use of a Clinical Trial Screening Tool to Address Cancer Health Disparities in the [employer] Research Program [facility])    Patient [name] was identified by Dr. Lanny as a potential candidate for the above listed study.  This Clinical Research Coordinator met with [name], [id number], on 10/28/23 in a manner and location that ensures patient privacy to discuss participation in the above listed research study.  Patient is Accompanied by husband.  APatient reads, speaks, and understands [place].   Patient was provided with the business card of this Coordinator and encouraged to contact the research team with any questions.  Approximately 10 minutes were spent with the patient reviewing the informed consent documents.  Patient was provided the option of taking informed consent documents home to review and was encouraged to review at their convenience with their support network, including other care providers. Patient is comfortable with making a decision regarding study participation today. Patient declined one time questionnaire. /patient mentioned she is not interested.  [name], MPH  Clinical Research Coordinator

## 2023-10-28 NOTE — Progress Notes (Signed)
 [facility]   [phone] Fax:([phone]) [phone]   Clinic Follow up Note   Patient Care Team: Geofm Glade PARAS, MD as PCP - General (Internal Medicine) Tasia Lung, MD as Consulting Physician (Psychiatry) Melodi Lerner, MD as Consulting Physician (Orthopedic Surgery) Barbette Knock, MD as Consulting Physician (Obstetrics and Gynecology) Shannon Agent, MD as Consulting Physician (Radiation Oncology) Lanny Callander, MD as Consulting Physician (Hematology) [facility] as Consulting Physician (Radiology) Lita Lye, OD as Referring Physician (Optometry) Ebbie Cough, MD as Consulting Physician (General Surgery) Glean Stephane BROCKS, RN (Inactive) as Oncology Nurse Navigator Tyree Nanetta SAILOR, RN as Oncology Nurse Navigator  Date of Service:  10/28/2023  CHIEF COMPLAINT: f/u of left breast cancer  CURRENT THERAPY:  Adjuvant chemotherapy AC-T  Oncology History   Malignant neoplasm of central portion of left breast in female, estrogen receptor negative (HCC) -cT1bN0M0, stage IB, ER-/PR-/HER2- -breast MRI 09/11/22 detected a non-mass enhancement in L breast but it was felt to be a blood vessel when biopsy was attempted.  A close follow-up breast MRI 12/14/22 showed persistent linear non mass enhancement in the lateral posterior left breast, bx was benign and concordant.  -breast MRI 06/21/2023 showed a 1.0 cm of non mass enhancement/possible enhancing mass with progressive Kinetics which is suspicious for malignancy, biopsy showed grade 2 invasive ductal carcinoma, triple negative.  The area measures 0.8 cm on ultrasound. - She underwent a bilateral mastectomy in the left lymph node dissection on Aug 22, 2023. -Surgical path showed 2.8 cm triple negative left breast adeno carcinoma, negative margins, all 6 lymph nodes were negative. -I recommend adjuvant chemotherapy AC-T, she started on 09/30/2023   Assessment & Plan Breast cancer undergoing  chemotherapy She is undergoing chemotherapy for breast cancer, transitioning to weekly Taxol. Discussed potential neuropathy with Taxol and a study to prevent it using a device. The study involves randomization into three groups: control, compression without cold, and cold with compression. She expressed concern about not receiving the cold treatment if randomized to the control group and decided against participating in the study, opting for standard ice treatment to reduce neuropathy risk. Completed two cycles of AC chemotherapy with some side effects but overall doing well. Lab results show elevated white count due to the shot, hemoglobin at 9.8, and normal kidney and liver function. - Administer weekly Taxol chemotherapy. - Advise use of standard ice treatment to reduce neuropathy risk. - Do not administer injections with Taxol unless counts are very low.  Lymphedema concern She is concerned about potential lymphedema following physical therapy for breast cancer. Experienced swelling and tenderness in the arm after weight work, but no obvious lymphedema observed. - Rest the affected arm for a few days until soreness resolves. - Continue physical therapy exercises at home as instructed.  Lichen sclerosus She has lichen sclerosus, an autoimmune gynecological condition, which has flared up with sores in the labia. She has been using a steroid cream, which has started to improve the condition. Chemotherapy can exacerbate mucosal sensitivity, potentially contributing to the flare-up. - Continue using steroid cream for local therapy.  Insomnia due to steroid use She reports severe insomnia, likely exacerbated by steroid use during chemotherapy. The insomnia persists even in the second week post-chemotherapy when not taking steroids. She has been using trazodone , CBD gummies, and occasionally Xanax to manage sleep issues. - Discontinue post-chemotherapy steroid use if no rash or allergic reaction  occurs. - Consider using melatonin or Benadryl for sleep aid. - Allow for daytime  naps if needed.  Plan - Lab reviewed, adequate for treatment, will proceed with cycle 3 AC today with G-CSF 2 days after - Patient was screened for the neuropathy clinical trial, she declined. - Follow-up in 2 weeks before last cycle AC -repeat ECHO in 1-2 weeks    SUMMARY OF ONCOLOGIC HISTORY: Oncology History  Breast cancer, BRCA2 positive, left (HCC)  04/15/2017 Initial Diagnosis   Breast cancer, BRCA2 positive, left (HCC)   09/30/2023 -  Chemotherapy   Patient is on Treatment Plan : BREAST DOSE DENSE AC q14d / PACLitaxel q7d     Malignant neoplasm of central portion of right breast in female, estrogen receptor positive (HCC)  09/12/2018 Initial Diagnosis   Ductal carcinoma in situ (DCIS) of right breast   09/17/2018 Cancer Staging   Staging form: Breast, AJCC 8th Edition - Clinical: Stage 0 (cTis (DCIS), cN0, cM0, ER+, PR+, HER2: Not Assessed) - Signed by Crawford Morna Pickle, NP on 09/17/2018   10/02/2018 Cancer Staging   Staging form: Breast, AJCC 8th Edition - Pathologic stage from 10/02/2018: Stage IA (pT1b, pN0, cM0, G2, ER+, PR+, HER2-) - Signed by Crawford Morna Pickle, NP on 10/15/2018   Malignant neoplasm of central portion of left breast in female, estrogen receptor negative (HCC)  07/15/2023 Cancer Staging   Staging form: Breast, AJCC 8th Edition - Clinical stage from 07/15/2023: Stage IB (cT1b, cN0, cM0, G2, ER-, PR-, HER2-) - Signed by Lanny Callander, MD on 07/17/2023 Stage prefix: Initial diagnosis Histologic grading system: 3 grade system   07/17/2023 Initial Diagnosis   Malignant neoplasm of central portion of left breast in female, estrogen receptor negative (HCC)      Discussed the use of AI scribe software for clinical note transcription with the patient, who gave verbal consent to proceed.  History of Present Illness [name] is a 68 year old female with breast cancer  who presents for follow-up.  She is undergoing chemotherapy, having completed two cycles of Adriamycin  and Cytoxan , and is transitioning to weekly Taxol. Nausea during the first cycle is effectively managed with Compazine . She is concerned about potential neuropathy.  She experiences significant insomnia, which she attributes to steroid use during chemotherapy. She uses trazodone , CBD gummies, and occasionally Xanax, but continues to have difficulty sleeping through the night.  She is anxious about lymphedema after noticing swelling and tenderness in her arm following weight work during a physical therapy session. The arm appears tight and slightly larger than the other arm. She is scheduled for a measurement in a couple of weeks.     All other systems were reviewed with the patient and are negative.  MEDICAL HISTORY:  Past Medical History:  Diagnosis Date   Anxiety    Anxiety disorder    BRCA2 positive 07/14/2012   Cancer (HCC) 2008   left breast. Right Breast Cancer in 2020   Dyslipidemia    Fall    Goiter    History of radiation therapy    Hypothyroidism    Osteoporosis     SURGICAL HISTORY: Past Surgical History:  Procedure Laterality Date   ABDOMINAL HYSTERECTOMY  2009   with BSO   BREAST LUMPECTOMY  [id number], 1991   BREAST LUMPECTOMY WITH RADIOACTIVE SEED LOCALIZATION Right 10/02/2018   Procedure: RIGHT BREAST LUMPECTOMY WITH RADIOACTIVE SEED LOCALIZATION;  Surgeon: Curvin Deward MOULD, MD;  Location: [facility];  Service: General;  Laterality: Right;   COLONOSCOPY N/A 12/13/2015   Procedure: COLONOSCOPY;  Surgeon: Gladis MARLA Louder, MD;  Location: [facility];  Service: Endoscopy;  Laterality: N/A;   COLONOSCOPY  2023   FRACTURE SURGERY  2015   left hip   HARDWARE REMOVAL Left 07/08/2015   Procedure: LEFT HIP HARDWARE REMOVAL;  Surgeon: Dempsey Moan, MD;  Location: [facility];  Service: Orthopedics;  Laterality: Left;   MASTECTOMY W/ SENTINEL NODE BIOPSY Left  08/22/2023   Procedure: MASTECTOMY WITH SENTINEL LYMPH NODE BIOPSY;  Surgeon: Ebbie Cough, MD;  Location: [facility] OR;  Service: General;  Laterality: Left;  LMA w/ PEC BLOCK 150 MINUTES LEFT MASTECTOMY LEFT AXILLARY SENTINEL NODE BIOPSY MAGTRACE AND BLUE DYE INJECTION   PORTACATH PLACEMENT N/A 08/22/2023   Procedure: INSERTION, TUNNELED CENTRAL VENOUS DEVICE, WITH PORT;  Surgeon: Ebbie Cough, MD;  Location: [facility];  Service: General;  Laterality: N/A;  PORT PLACEMENT WITH ULTRASOUND GUIDANCE   TOTAL MASTECTOMY Right 08/22/2023   Procedure: MASTECTOMY, SIMPLE;  Surgeon: Ebbie Cough, MD;  Location: [facility] OR;  Service: General;  Laterality: Right;  RIGHT RISK REDUCING MASTECTOMY    I have reviewed the social history and family history with the patient and they are unchanged from previous note.  ALLERGIES:  is allergic to adhesive [tape], chloraprep one step [chlorhexidine  gluconate], latex, shellfish allergy, cephalexin, ampicillin, betadine  [povidone iodine ], and macrodantin [nitrofurantoin].  MEDICATIONS:  Current Outpatient Medications  Medication Sig Dispense Refill   atorvastatin  (LIPITOR) 10 MG tablet Take 0.5 tablets (5 mg total) by mouth daily. 90 tablet 1   clorazepate (TRANXENE) 15 MG tablet Take 7.5-15 mg by mouth See admin instructions. Take 15 mg at bedtime, may take a 7.5 mg dose in the morning as needed anxiety     dexamethasone  (DECADRON ) 4 MG tablet Take 2 tablets (8 mg total) by mouth daily for 3 days. Start the day after doxorubicin /cyclophosphamide  chemotherapy. Take with food. 30 tablet 1   docusate sodium  (COLACE) 250 MG capsule Take 250 mg by mouth daily.     fluconazole  (DIFLUCAN ) 150 MG tablet Take 1 tablet (150 mg total) by mouth daily. 1 tablet 0   levothyroxine  (SYNTHROID ) 125 MCG tablet Take 1 tablet (125 mcg total) by mouth daily. Take 5 days a  week. (Patient taking differently: Take 125 mcg by mouth every Monday, Tuesday, Wednesday, Thursday, and Friday.  Take 5 days a  week.) 90 tablet 1   lidocaine -prilocaine  (EMLA ) cream Apply to affected area once 30 g 3   methocarbamol  (ROBAXIN ) 500 MG tablet Take 1 tablet (500 mg total) by mouth every 8 (eight) hours as needed (use for muscle cramps/pain). 30 tablet 0   Multiple Vitamins-Minerals (LUTEIN-ZEAXANTHIN) TABS Take 1 tablet by mouth daily.     ondansetron  (ZOFRAN ) 8 MG tablet Take 1 tab (8 mg) by mouth every 8 hrs as needed for nausea/vomiting. Start third day after doxorubicin /cyclophosphamide  chemotherapy. 30 tablet 1   prochlorperazine  (COMPAZINE ) 10 MG tablet TAKE 1 TABLET(10 MG) BY MOUTH EVERY 6 HOURS AS NEEDED FOR NAUSEA OR VOMITING 30 tablet 1   sulfamethoxazole -trimethoprim  (BACTRIM  DS) 800-160 MG tablet Take 1 tablet by mouth 2 (two) times daily. 10 tablet 0   traMADol (ULTRAM) 50 MG tablet Take 50 mg by mouth every 6 (six) hours as needed.     traZODone  (DESYREL ) 100 MG tablet Take 200 mg by mouth at bedtime.     VITAMIN D  PO Place 1 Dose under the tongue daily. drops     No current facility-administered medications for this visit.   Facility-Administered Medications Ordered in Other Visits  Medication Dose Route Frequency Provider  Last Rate Last Admin   0.9 %  sodium chloride  infusion   Intravenous Continuous Lanny Callander, MD   Stopped at 10/28/23 1347   sodium chloride  flush (NS) 0.9 % injection 10 mL  10 mL Intracatheter PRN Lanny Callander, MD        PHYSICAL EXAMINATION: ECOG PERFORMANCE STATUS: 1 - Symptomatic but completely ambulatory  Vitals:   10/28/23 1054  BP: 110/60  Pulse: 68  Resp: 15  Temp: 97.7 F (36.5 C)  SpO2: 98%   Wt Readings from Last 3 Encounters:  10/28/23 144 lb 3.2 oz (65.4 kg)  10/14/23 143 lb 4.8 oz (65 kg)  09/23/23 143 lb 11.2 oz (65.2 kg)     GENERAL:alert, no distress and comfortable SKIN: skin color, texture, turgor are normal, no rashes or significant lesions EYES: normal, Conjunctiva are pink and non-injected, sclera clear NECK: supple,  thyroid  normal size, non-tender, without nodularity LYMPH:  no palpable lymphadenopathy in the cervical, axillary  LUNGS: clear to auscultation and percussion with normal breathing effort HEART: regular rate & rhythm and no murmurs and no lower extremity edema ABDOMEN:abdomen soft, non-tender and normal bowel sounds Musculoskeletal:no cyanosis of digits and no clubbing  NEURO: alert & oriented x 3 with fluent speech, no focal motor/sensory deficits  Physical Exam   LABORATORY DATA:  I have reviewed the data as listed    Latest Ref Rng & Units 10/28/2023   10:09 AM 10/14/2023    9:51 AM 09/30/2023   10:12 AM  CBC  WBC 4.0 - 10.5 K/uL 22.1  20.6  5.7   Hemoglobin 12.0 - 15.0 g/dL 9.8  89.3  88.5   Hematocrit 36.0 - 46.0 % 29.5  31.0  33.9   Platelets 150 - 400 K/uL 188  168  236         Latest Ref Rng & Units 10/28/2023   10:09 AM 10/14/2023    9:51 AM 09/30/2023   10:12 AM  CMP  Glucose 70 - 99 mg/dL 895  97  93   BUN 8 - 23 mg/dL 10  12  17    Creatinine 0.44 - 1.00 mg/dL 9.13  9.13  9.16   Sodium 135 - 145 mmol/L 141  138  139   Potassium 3.5 - 5.1 mmol/L 3.8  3.5  3.9   Chloride 98 - 111 mmol/L 106  103  105   CO2 22 - 32 mmol/L 29  28  27    Calcium  8.9 - 10.3 mg/dL 9.5  9.6  9.8   Total Protein 6.5 - 8.1 g/dL 6.8  6.7  6.9   Total Bilirubin 0.0 - 1.2 mg/dL 0.3  0.3  0.4   Alkaline Phos 38 - 126 U/L 129  118  63   AST 15 - 41 U/L 15  24  19    ALT 0 - 44 U/L 19  40  21       RADIOGRAPHIC STUDIES: I have personally reviewed the radiological images as listed and agreed with the findings in the report. No results found.    Orders Placed This Encounter  Procedures   CBC with Differential (Cancer Center Only)    Standing Status:   Future    Expected Date:   11/25/2023    Expiration Date:   11/24/2024   CMP (Cancer Center only)    Standing Status:   Future    Expected Date:   11/25/2023    Expiration Date:   11/24/2024   CBC with Differential (Cancer  Center Only)     Standing Status:   Future    Expected Date:   12/02/2023    Expiration Date:   12/01/2024   CMP (Cancer Center only)    Standing Status:   Future    Expected Date:   12/02/2023    Expiration Date:   12/01/2024   CBC with Differential (Cancer Center Only)    Standing Status:   Future    Expected Date:   12/09/2023    Expiration Date:   12/08/2024   CMP (Cancer Center only)    Standing Status:   Future    Expected Date:   12/09/2023    Expiration Date:   12/08/2024   CBC with Differential (Cancer Center Only)    Standing Status:   Future    Expected Date:   12/16/2023    Expiration Date:   12/15/2024   CMP (Cancer Center only)    Standing Status:   Future    Expected Date:   12/16/2023    Expiration Date:   12/15/2024   All questions were answered. The patient knows to call the clinic with any problems, questions or concerns. No barriers to learning was detected. The total time spent in the appointment was 40 minutes, including review of chart and various tests results, discussions about plan of care and coordination of care plan     Onita Mattock, MD 10/28/2023

## 2023-10-28 NOTE — Research (Signed)
 S2205, ICE COMPRESS: RANDOMIZED TRIAL OF LIMB CRYOCOMPRESSION VERSUS CONTINUOUS COMPRESSION VERSUS LOW CYCLIC COMPRESSION FOR THE PREVENTION  OF TAXANE-INDUCED PERIPHERAL NEUROPATHY    Patient [name] was identified by Dr. Lanny as a potential candidate for the above listed study.  This Clinical Research Coordinator met with MAITRI SCHNOEBELEN, [id number], on 10/28/23 in the room with treating physician, Dr. Lanny in a manner and location that ensures patient privacy to discuss participation in the above listed research study.  Patient is Accompanied by husband.   Patient reads, speaks, and understands [place].   Approximately 10 minutes were spent with the patient reviewing the informed consent documents.  Patient was provided the option of taking informed consent documents home to review and was encouraged to review at their convenience with their support network, including other care providers. Patient is comfortable with making a decision regarding study participation today. Patient declined study participation. Patient mentioned she prefers cold or ice treatment. She decided randomized trial with 2/3 probability she would not get the cold arm was not a good fit. Dr. Lanny aware.  Laury Quale, MPH  Clinical Research Coordinator

## 2023-10-29 ENCOUNTER — Telehealth: Payer: Self-pay | Admitting: Hematology

## 2023-10-29 ENCOUNTER — Other Ambulatory Visit: Payer: Self-pay | Admitting: Hematology

## 2023-10-29 NOTE — Telephone Encounter (Signed)
 Scheduled appointments per WQ. Talked with the patient and she is aware of the made appointments.

## 2023-10-30 ENCOUNTER — Ambulatory Visit

## 2023-10-30 ENCOUNTER — Inpatient Hospital Stay

## 2023-10-30 VITALS — BP 125/65 | HR 64 | Temp 98.4°F | Resp 17

## 2023-10-30 DIAGNOSIS — Z5111 Encounter for antineoplastic chemotherapy: Secondary | ICD-10-CM | POA: Diagnosis not present

## 2023-10-30 DIAGNOSIS — C50912 Malignant neoplasm of unspecified site of left female breast: Secondary | ICD-10-CM

## 2023-10-30 MED ORDER — PEGFILGRASTIM-FPGK 6 MG/0.6ML ~~LOC~~ SOSY
6.0000 mg | PREFILLED_SYRINGE | Freq: Once | SUBCUTANEOUS | Status: AC
Start: 2023-10-30 — End: 2023-10-30
  Administered 2023-10-30: 6 mg via SUBCUTANEOUS
  Filled 2023-10-30: qty 0.6

## 2023-10-31 ENCOUNTER — Encounter: Admitting: Rehabilitation

## 2023-11-04 ENCOUNTER — Other Ambulatory Visit

## 2023-11-04 ENCOUNTER — Ambulatory Visit

## 2023-11-04 ENCOUNTER — Ambulatory Visit: Admitting: Hematology

## 2023-11-04 ENCOUNTER — Other Ambulatory Visit: Payer: Self-pay | Admitting: Hematology

## 2023-11-04 DIAGNOSIS — C50912 Malignant neoplasm of unspecified site of left female breast: Secondary | ICD-10-CM

## 2023-11-06 ENCOUNTER — Ambulatory Visit

## 2023-11-08 ENCOUNTER — Other Ambulatory Visit (HOSPITAL_COMMUNITY)

## 2023-11-08 ENCOUNTER — Telehealth: Payer: Self-pay | Admitting: Hematology

## 2023-11-08 NOTE — Telephone Encounter (Signed)
 Scheduled appointments per WQ. Called and left a VM with appointment details for the patient.

## 2023-11-10 NOTE — Assessment & Plan Note (Signed)
-  [id number], stage IB, ER-/PR-/HER2- -breast MRI 09/11/22 detected a non-mass enhancement in L breast but it was felt to be a blood vessel when biopsy was attempted.  A close follow-up breast MRI 12/14/22 showed persistent linear non mass enhancement in the lateral posterior left breast, bx was benign and concordant.  -breast MRI 06/21/2023 showed a 1.0 cm of non mass enhancement/possible enhancing mass with progressive Kinetics which is suspicious for malignancy, biopsy showed grade 2 invasive ductal carcinoma, triple negative.  The area measures 0.8 cm on ultrasound. - She underwent a bilateral mastectomy in the left lymph node dissection on Aug 22, 2023. -Surgical path showed 2.8 cm triple negative left breast adeno carcinoma, negative margins, all 6 lymph nodes were negative. -I recommend adjuvant chemotherapy AC-T, she started on 09/30/2023

## 2023-11-11 ENCOUNTER — Inpatient Hospital Stay

## 2023-11-11 ENCOUNTER — Inpatient Hospital Stay: Attending: Hematology

## 2023-11-11 ENCOUNTER — Inpatient Hospital Stay (HOSPITAL_BASED_OUTPATIENT_CLINIC_OR_DEPARTMENT_OTHER): Admitting: Hematology

## 2023-11-11 VITALS — BP 106/55 | HR 65 | Temp 97.9°F | Resp 16

## 2023-11-11 DIAGNOSIS — M810 Age-related osteoporosis without current pathological fracture: Secondary | ICD-10-CM | POA: Diagnosis not present

## 2023-11-11 DIAGNOSIS — E785 Hyperlipidemia, unspecified: Secondary | ICD-10-CM | POA: Diagnosis not present

## 2023-11-11 DIAGNOSIS — C50912 Malignant neoplasm of unspecified site of left female breast: Secondary | ICD-10-CM

## 2023-11-11 DIAGNOSIS — D72829 Elevated white blood cell count, unspecified: Secondary | ICD-10-CM | POA: Diagnosis not present

## 2023-11-11 DIAGNOSIS — T451X5A Adverse effect of antineoplastic and immunosuppressive drugs, initial encounter: Secondary | ICD-10-CM | POA: Insufficient documentation

## 2023-11-11 DIAGNOSIS — E039 Hypothyroidism, unspecified: Secondary | ICD-10-CM | POA: Insufficient documentation

## 2023-11-11 DIAGNOSIS — Z7952 Long term (current) use of systemic steroids: Secondary | ICD-10-CM | POA: Insufficient documentation

## 2023-11-11 DIAGNOSIS — C50112 Malignant neoplasm of central portion of left female breast: Secondary | ICD-10-CM | POA: Insufficient documentation

## 2023-11-11 DIAGNOSIS — Z1501 Genetic susceptibility to malignant neoplasm of breast: Secondary | ICD-10-CM | POA: Insufficient documentation

## 2023-11-11 DIAGNOSIS — Z171 Estrogen receptor negative status [ER-]: Secondary | ICD-10-CM | POA: Insufficient documentation

## 2023-11-11 DIAGNOSIS — Z9012 Acquired absence of left breast and nipple: Secondary | ICD-10-CM | POA: Insufficient documentation

## 2023-11-11 DIAGNOSIS — Z79632 Long term (current) use of antitumor antibiotic: Secondary | ICD-10-CM | POA: Insufficient documentation

## 2023-11-11 DIAGNOSIS — R110 Nausea: Secondary | ICD-10-CM | POA: Insufficient documentation

## 2023-11-11 DIAGNOSIS — K5903 Drug induced constipation: Secondary | ICD-10-CM | POA: Insufficient documentation

## 2023-11-11 DIAGNOSIS — Z79899 Other long term (current) drug therapy: Secondary | ICD-10-CM | POA: Diagnosis not present

## 2023-11-11 DIAGNOSIS — Z95828 Presence of other vascular implants and grafts: Secondary | ICD-10-CM

## 2023-11-11 DIAGNOSIS — Z79630 Long term (current) use of alkylating agent: Secondary | ICD-10-CM | POA: Insufficient documentation

## 2023-11-11 DIAGNOSIS — Z5111 Encounter for antineoplastic chemotherapy: Secondary | ICD-10-CM | POA: Diagnosis present

## 2023-11-11 DIAGNOSIS — Z79890 Hormone replacement therapy (postmenopausal): Secondary | ICD-10-CM | POA: Insufficient documentation

## 2023-11-11 DIAGNOSIS — G479 Sleep disorder, unspecified: Secondary | ICD-10-CM | POA: Insufficient documentation

## 2023-11-11 LAB — CMP (CANCER CENTER ONLY)
ALT: 11 U/L (ref 0–44)
AST: 12 U/L — ABNORMAL LOW (ref 15–41)
Albumin: 4.2 g/dL (ref 3.5–5.0)
Alkaline Phosphatase: 113 U/L (ref 38–126)
Anion gap: 7 (ref 5–15)
BUN: 11 mg/dL (ref 8–23)
CO2: 28 mmol/L (ref 22–32)
Calcium: 9.2 mg/dL (ref 8.9–10.3)
Chloride: 105 mmol/L (ref 98–111)
Creatinine: 0.83 mg/dL (ref 0.44–1.00)
GFR, Estimated: >60 mL/min (ref >60)
Glucose, Bld: 148 mg/dL — ABNORMAL HIGH (ref 70–99)
Potassium: 3.9 mmol/L (ref 3.5–5.1)
Sodium: 140 mmol/L (ref 135–145)
Total Bilirubin: 0.3 mg/dL (ref 0.0–1.2)
Total Protein: 6.4 g/dL — ABNORMAL LOW (ref 6.5–8.1)

## 2023-11-11 LAB — CBC WITH DIFFERENTIAL (CANCER CENTER ONLY)
Abs Immature Granulocytes: 3.75 K/uL — ABNORMAL HIGH (ref 0.00–0.07)
Basophils Absolute: 0.1 K/uL (ref 0.0–0.1)
Basophils Relative: 0 %
Eosinophils Absolute: 0 K/uL (ref 0.0–0.5)
Eosinophils Relative: 0 %
HCT: 27.8 % — ABNORMAL LOW (ref 36.0–46.0)
Hemoglobin: 9 g/dL — ABNORMAL LOW (ref 12.0–15.0)
Immature Granulocytes: 16 %
Lymphocytes Relative: 4 %
Lymphs Abs: 1 K/uL (ref 0.7–4.0)
MCH: 28.9 pg (ref 26.0–34.0)
MCHC: 32.4 g/dL (ref 30.0–36.0)
MCV: 89.4 fL (ref 80.0–100.0)
Monocytes Absolute: 1.1 K/uL — ABNORMAL HIGH (ref 0.1–1.0)
Monocytes Relative: 5 %
Neutro Abs: 18 K/uL — ABNORMAL HIGH (ref 1.7–7.7)
Neutrophils Relative %: 75 %
Platelet Count: 211 K/uL (ref 150–400)
RBC: 3.11 MIL/uL — ABNORMAL LOW (ref 3.87–5.11)
RDW: 16.3 % — ABNORMAL HIGH (ref 11.5–15.5)
Smear Review: NORMAL
WBC Count: 24 K/uL — ABNORMAL HIGH (ref 4.0–10.5)
nRBC: 0.3 % — ABNORMAL HIGH (ref 0.0–0.2)

## 2023-11-11 MED ORDER — SODIUM CHLORIDE 0.9% FLUSH
10.0000 mL | Freq: Once | INTRAVENOUS | Status: AC
Start: 2023-11-11 — End: 2023-11-11
  Administered 2023-11-11 (×2): 10 mL

## 2023-11-11 MED ORDER — DEXAMETHASONE SODIUM PHOSPHATE 10 MG/ML IJ SOLN
10.0000 mg | Freq: Once | INTRAMUSCULAR | Status: AC
  Administered 2023-11-11 (×2): 10 mg via INTRAVENOUS
  Filled 2023-11-11: qty 1

## 2023-11-11 MED ORDER — APREPITANT 130 MG/18ML IV EMUL
130.0000 mg | Freq: Once | INTRAVENOUS | Status: AC
  Administered 2023-11-11 (×2): 130 mg via INTRAVENOUS
  Filled 2023-11-11: qty 18

## 2023-11-11 MED ORDER — DOXORUBICIN HCL CHEMO IV INJECTION 2 MG/ML
60.0000 mg/m2 | Freq: Once | INTRAVENOUS | Status: AC
  Administered 2023-11-11 (×2): 104 mg via INTRAVENOUS
  Filled 2023-11-11: qty 52

## 2023-11-11 MED ORDER — SODIUM CHLORIDE 0.9 % IV SOLN: INTRAVENOUS | Status: DC

## 2023-11-11 MED ORDER — SODIUM CHLORIDE 0.9 % IV SOLN
600.0000 mg/m2 | Freq: Once | INTRAVENOUS | Status: AC
  Administered 2023-11-11 (×2): 1000 mg via INTRAVENOUS
  Filled 2023-11-11: qty 50

## 2023-11-11 MED ORDER — PALONOSETRON HCL INJECTION 0.25 MG/5ML
0.2500 mg | Freq: Once | INTRAVENOUS | Status: AC
  Administered 2023-11-11 (×2): 0.25 mg via INTRAVENOUS
  Filled 2023-11-11: qty 5

## 2023-11-11 NOTE — Patient Instructions (Signed)
 [facility] - A DEPT OF [place]  Discharge Instructions: Thank you for choosing [facility] to provide your oncology and hematology care.   If you have a lab appointment with the Cancer Center, please go directly to the Cancer Center and check in at the registration area.   Wear comfortable clothing and clothing appropriate for easy access to any Portacath or PICC line.   We strive to give you quality time with your provider. You may need to reschedule your appointment if you arrive late (15 or more minutes).  Arriving late affects you and other patients whose appointments are after yours.  Also, if you miss three or more appointments without notifying the office, you may be dismissed from the clinic at the provider's discretion.      For prescription refill requests, have your pharmacy contact our office and allow 72 hours for refills to be completed.    Today you received the following chemotherapy and/or immunotherapy agents: Doxorubicin  & Cyclophosphamide    To help prevent nausea and vomiting after your treatment, we encourage you to take your nausea medication as directed.  BELOW ARE SYMPTOMS THAT SHOULD BE REPORTED IMMEDIATELY: *FEVER GREATER THAN 100.4 F (38 C) OR HIGHER *CHILLS OR SWEATING *NAUSEA AND VOMITING THAT IS NOT CONTROLLED WITH YOUR NAUSEA MEDICATION *UNUSUAL SHORTNESS OF BREATH *UNUSUAL BRUISING OR BLEEDING *URINARY PROBLEMS (pain or burning when urinating, or frequent urination) *BOWEL PROBLEMS (unusual diarrhea, constipation, pain near the anus) TENDERNESS IN MOUTH AND THROAT WITH OR WITHOUT PRESENCE OF ULCERS (sore throat, sores in mouth, or a toothache) UNUSUAL RASH, SWELLING OR PAIN  UNUSUAL VAGINAL DISCHARGE OR ITCHING   Items with * indicate a potential emergency and should be followed up as soon as possible or go to the Emergency Department if any problems should occur.  Please show the CHEMOTHERAPY ALERT CARD  or IMMUNOTHERAPY ALERT CARD at check-in to the Emergency Department and triage nurse.  Should you have questions after your visit or need to cancel or reschedule your appointment, please contact [facility] - A DEPT OF [facility]  Dept: [phone]  and follow the prompts.  Office hours are 8:00 a.m. to 4:30 p.m. Monday - Friday. Please note that voicemails left after 4:00 p.m. may not be returned until the following business day.  We are closed weekends and major holidays. You have access to a nurse at all times for urgent questions. Please call the main number to the clinic Dept: [phone] and follow the prompts.   For any non-urgent questions, you may also contact your provider using MyChart. We now offer e-Visits for anyone 58 and older to request care online for non-urgent symptoms. For details visit [email or web address].   Also download the MyChart app! Go to the app store, search MyChart, open the app, select [place], and log in with your MyChart username and password.

## 2023-11-11 NOTE — Progress Notes (Signed)
 [facility]   [phone] Fax:([phone]   Clinic Follow up Note   Patient Care Team: Geofm Glade PARAS, MD as PCP - General (Internal Medicine) Tasia Lung, MD as Consulting Physician (Psychiatry) Melodi Lerner, MD as Consulting Physician (Orthopedic Surgery) Barbette Knock, MD as Consulting Physician (Obstetrics and Gynecology) Shannon Agent, MD as Consulting Physician (Radiation Oncology) Lanny Callander, MD as Consulting Physician (Hematology) [facility] as Consulting Physician (Radiology) Lita Lye, OD as Referring Physician (Optometry) Ebbie Cough, MD as Consulting Physician (General Surgery) Glean Stephane BROCKS, RN (Inactive) as Oncology Nurse Navigator Tyree Nanetta SAILOR, RN as Oncology Nurse Navigator  Date of Service:  11/11/2023  CHIEF COMPLAINT: f/u of breast cancer   CURRENT THERAPY:  Adjuvant chemotherapy Adriamycin  and Cytoxan   Oncology History   Malignant neoplasm of central portion of left breast in female, estrogen receptor negative (HCC) -cT1bN0M0, stage IB, ER-/PR-/HER2- -breast MRI 09/11/22 detected a non-mass enhancement in L breast but it was felt to be a blood vessel when biopsy was attempted.  A close follow-up breast MRI 12/14/22 showed persistent linear non mass enhancement in the lateral posterior left breast, bx was benign and concordant.  -breast MRI 06/21/2023 showed a 1.0 cm of non mass enhancement/possible enhancing mass with progressive Kinetics which is suspicious for malignancy, biopsy showed grade 2 invasive ductal carcinoma, triple negative.  The area measures 0.8 cm on ultrasound. - She underwent a bilateral mastectomy in the left lymph node dissection on Aug 22, 2023. -Surgical path showed 2.8 cm triple negative left breast adeno carcinoma, negative margins, all 6 lymph nodes were negative. -I recommend adjuvant chemotherapy AC-T, she started on 09/30/2023  Assessment & Plan Breast cancer  on adjuvant chemotherapy Currently undergoing adjuvant chemotherapy with AC regimen. Nausea is well-controlled with Compazine . Constipation is likely due to anti-nausea medication. Nail bed discoloration is attributed to chemotherapy and expected to worsen with Taxol but is not dangerous. Echocardiogram is scheduled for September 24th to monitor potential cardiotoxicity from adriamycin . Weight is stable, indicating good nutritional intake. Hemoglobin is slightly low but not requiring transfusion. Elevated white count due to growth factor shot is not concerning. No signs of infection or other complications. - Proceed with last cycle of AC chemotherapy. - Instruct to stop refilling Compazine  if not needed. - Keep echocardiogram appointment on September 24th. - Start paclitaxel in two weeks after completion of AC regimen.  Chemotherapy-induced constipation Constipation experienced after cycle three of AC chemotherapy, likely due to anti-nausea medication. Managed with Miralax, which was not taken consistently. - Continue Miralax as needed for constipation.  Chemotherapy-induced nausea, controlled Nausea is well-controlled with Compazine . She has an excess supply of Compazine  and does not require further refills at this time. - Instruct to stop refilling Compazine  if not needed.  Chemotherapy-induced nail bed discoloration Nail bed discoloration attributed to chemotherapy. Expected to worsen with upcoming Taxol treatment but not dangerous. Discoloration will grow out in 3-4 months.  Anemia secondary to chemotherapy Hemoglobin is slightly low at 9, but not to the level requiring transfusion. This is likely secondary to chemotherapy.  Plan - She is overall tolerating chemotherapy well, lab reviewed, mild anemia, CMP still pending, if adequate, will proceed to last cycle AC today - Lab and follow-up in 2 weeks to start weekly paclitaxel   SUMMARY OF ONCOLOGIC HISTORY: Oncology History  Breast  cancer, BRCA2 positive, left (HCC)  04/15/2017 Initial Diagnosis   Breast cancer, BRCA2 positive, left (HCC)   09/30/2023 -  Chemotherapy  Patient is on Treatment Plan : BREAST DOSE DENSE AC q14d / PACLitaxel q7d     Malignant neoplasm of central portion of right breast in female, estrogen receptor positive (HCC)  09/12/2018 Initial Diagnosis   Ductal carcinoma in situ (DCIS) of right breast   09/17/2018 Cancer Staging   Staging form: Breast, AJCC 8th Edition - Clinical: Stage 0 (cTis (DCIS), cN0, cM0, ER+, PR+, HER2: Not Assessed) - Signed by Crawford Morna Pickle, NP on 09/17/2018   10/02/2018 Cancer Staging   Staging form: Breast, AJCC 8th Edition - Pathologic stage from 10/02/2018: Stage IA (pT1b, pN0, cM0, G2, ER+, PR+, HER2-) - Signed by Crawford Morna Pickle, NP on 10/15/2018   Malignant neoplasm of central portion of left breast in female, estrogen receptor negative (HCC)  07/15/2023 Cancer Staging   Staging form: Breast, AJCC 8th Edition - Clinical stage from 07/15/2023: Stage IB (cT1b, cN0, cM0, G2, ER-, PR-, HER2-) - Signed by Lanny Callander, MD on 07/17/2023 Stage prefix: Initial diagnosis Histologic grading system: 3 grade system   07/17/2023 Initial Diagnosis   Malignant neoplasm of central portion of left breast in female, estrogen receptor negative (HCC)      Discussed the use of AI scribe software for clinical note transcription with the patient, who gave verbal consent to proceed.  History of Present Illness [name] is a 68 year old female with breast cancer who presents for follow-up on adjuvant chemotherapy.  She is on her last cycle of adjuvant chemotherapy with doxorubicin  and cyclophosphamide . During the third cycle, she experienced constipation, which she attributes to insufficient use of Miralax. Antiemetic medication during infusion contributes to constipation, but effectively manages nausea. She has an excess supply of Compazine , receiving 30 pills weekly  but not utilizing all of them.  She has observed changes in her nail beds, which have become smaller and darker, a known side effect of chemotherapy. Her weight remains stable.  She reports difficulty sleeping, though the cause is unclear. She had an echocardiogram in June and is scheduled for a repeat on September 24th due to potential cardiac effects from adriamycin . No numbness, tingling, cough, or pain.  She mentions possible lymphedema in her arm, characterized by intermittent soreness. She has an appointment at the lymphedema center and is not currently using a compression sleeve as she was previously asymptomatic.     All other systems were reviewed with the patient and are negative.  MEDICAL HISTORY:  Past Medical History:  Diagnosis Date   Anxiety    Anxiety disorder    BRCA2 positive 07/14/2012   Cancer (HCC) 2008   left breast. Right Breast Cancer in 2020   Dyslipidemia    Fall    Goiter    History of radiation therapy    Hypothyroidism    Osteoporosis     SURGICAL HISTORY: Past Surgical History:  Procedure Laterality Date   ABDOMINAL HYSTERECTOMY  2009   with BSO   BREAST LUMPECTOMY  [id number], 1991   BREAST LUMPECTOMY WITH RADIOACTIVE SEED LOCALIZATION Right 10/02/2018   Procedure: RIGHT BREAST LUMPECTOMY WITH RADIOACTIVE SEED LOCALIZATION;  Surgeon: Curvin Deward MOULD, MD;  Location: [facility];  Service: General;  Laterality: Right;   COLONOSCOPY N/A 12/13/2015   Procedure: COLONOSCOPY;  Surgeon: Gladis MARLA Louder, MD;  Location: [facility];  Service: Endoscopy;  Laterality: N/A;   COLONOSCOPY  2023   FRACTURE SURGERY  2015   left hip   HARDWARE REMOVAL Left 07/08/2015   Procedure: LEFT HIP HARDWARE  REMOVAL;  Surgeon: Dempsey Moan, MD;  Location: [facility];  Service: Orthopedics;  Laterality: Left;   MASTECTOMY W/ SENTINEL NODE BIOPSY Left 08/22/2023   Procedure: MASTECTOMY WITH SENTINEL LYMPH NODE BIOPSY;  Surgeon: Ebbie Cough, MD;   Location: [facility];  Service: General;  Laterality: Left;  LMA w/ PEC BLOCK 150 MINUTES LEFT MASTECTOMY LEFT AXILLARY SENTINEL NODE BIOPSY MAGTRACE AND BLUE DYE INJECTION   PORTACATH PLACEMENT N/A 08/22/2023   Procedure: INSERTION, TUNNELED CENTRAL VENOUS DEVICE, WITH PORT;  Surgeon: Ebbie Cough, MD;  Location: [facility];  Service: General;  Laterality: N/A;  PORT PLACEMENT WITH ULTRASOUND GUIDANCE   TOTAL MASTECTOMY Right 08/22/2023   Procedure: MASTECTOMY, SIMPLE;  Surgeon: Ebbie Cough, MD;  Location: [facility];  Service: General;  Laterality: Right;  RIGHT RISK REDUCING MASTECTOMY    I have reviewed the social history and family history with the patient and they are unchanged from previous note.  ALLERGIES:  is allergic to adhesive [tape], chloraprep one step [chlorhexidine  gluconate], latex, shellfish allergy, cephalexin, ampicillin, betadine  [povidone iodine ], and macrodantin [nitrofurantoin].  MEDICATIONS:  Current Outpatient Medications  Medication Sig Dispense Refill   atorvastatin  (LIPITOR) 10 MG tablet Take 0.5 tablets (5 mg total) by mouth daily. 90 tablet 1   clorazepate (TRANXENE) 15 MG tablet Take 7.5-15 mg by mouth See admin instructions. Take 15 mg at bedtime, may take a 7.5 mg dose in the morning as needed anxiety     dexamethasone  (DECADRON ) 4 MG tablet Take 2 tablets (8 mg total) by mouth daily for 3 days. Start the day after doxorubicin /cyclophosphamide  chemotherapy. Take with food. 30 tablet 1   docusate sodium  (COLACE) 250 MG capsule Take 250 mg by mouth daily.     fluconazole  (DIFLUCAN ) 150 MG tablet Take 1 tablet (150 mg total) by mouth daily. 1 tablet 0   levothyroxine  (SYNTHROID ) 125 MCG tablet Take 1 tablet (125 mcg total) by mouth daily. Take 5 days a  week. (Patient taking differently: Take 125 mcg by mouth every Monday, Tuesday, Wednesday, Thursday, and Friday. Take 5 days a  week.) 90 tablet 1   lidocaine -prilocaine  (EMLA ) cream Apply to affected area once 30 g  3   methocarbamol  (ROBAXIN ) 500 MG tablet Take 1 tablet (500 mg total) by mouth every 8 (eight) hours as needed (use for muscle cramps/pain). 30 tablet 0   Multiple Vitamins-Minerals (LUTEIN-ZEAXANTHIN) TABS Take 1 tablet by mouth daily.     ondansetron  (ZOFRAN ) 8 MG tablet Take 1 tab (8 mg) by mouth every 8 hrs as needed for nausea/vomiting. Start third day after doxorubicin /cyclophosphamide  chemotherapy. 30 tablet 1   prochlorperazine  (COMPAZINE ) 10 MG tablet TAKE 1 TABLET(10 MG) BY MOUTH EVERY 6 HOURS AS NEEDED FOR NAUSEA OR VOMITING 30 tablet 1   sulfamethoxazole -trimethoprim  (BACTRIM  DS) 800-160 MG tablet Take 1 tablet by mouth 2 (two) times daily. 10 tablet 0   traMADol (ULTRAM) 50 MG tablet Take 50 mg by mouth every 6 (six) hours as needed.     traZODone  (DESYREL ) 100 MG tablet Take 200 mg by mouth at bedtime.     VITAMIN D  PO Place 1 Dose under the tongue daily. drops     No current facility-administered medications for this visit.    PHYSICAL EXAMINATION: ECOG PERFORMANCE STATUS: 1 - Symptomatic but completely ambulatory  There were no vitals filed for this visit. Wt Readings from Last 3 Encounters:  10/28/23 144 lb 3.2 oz (65.4 kg)  10/14/23 143 lb 4.8 oz (65 kg)  09/23/23 143 lb 11.2  oz (65.2 kg)     GENERAL:alert, no distress and comfortable SKIN: skin color, texture, turgor are normal, no rashes or significant lesions EYES: normal, Conjunctiva are pink and non-injected, sclera clear NECK: supple, thyroid  normal size, non-tender, without nodularity LYMPH:  no palpable lymphadenopathy in the cervical, axillary  LUNGS: clear to auscultation and percussion with normal breathing effort HEART: regular rate & rhythm and no murmurs and no lower extremity edema ABDOMEN:abdomen soft, non-tender and normal bowel sounds Musculoskeletal:no cyanosis of digits and no clubbing  NEURO: alert & oriented x 3 with fluent speech, no focal motor/sensory deficits  Physical  Exam    LABORATORY DATA:  I have reviewed the data as listed    Latest Ref Rng & Units 11/11/2023    9:24 AM 10/28/2023   10:09 AM 10/14/2023    9:51 AM  CBC  WBC 4.0 - 10.5 K/uL 24.0  22.1  20.6   Hemoglobin 12.0 - 15.0 g/dL 9.0  9.8  89.3   Hematocrit 36.0 - 46.0 % 27.8  29.5  31.0   Platelets 150 - 400 K/uL 211  188  168         Latest Ref Rng & Units 10/28/2023   10:09 AM 10/14/2023    9:51 AM 09/30/2023   10:12 AM  CMP  Glucose 70 - 99 mg/dL 895  97  93   BUN 8 - 23 mg/dL 10  12  17    Creatinine 0.44 - 1.00 mg/dL 9.13  9.13  9.16   Sodium 135 - 145 mmol/L 141  138  139   Potassium 3.5 - 5.1 mmol/L 3.8  3.5  3.9   Chloride 98 - 111 mmol/L 106  103  105   CO2 22 - 32 mmol/L 29  28  27    Calcium  8.9 - 10.3 mg/dL 9.5  9.6  9.8   Total Protein 6.5 - 8.1 g/dL 6.8  6.7  6.9   Total Bilirubin 0.0 - 1.2 mg/dL 0.3  0.3  0.4   Alkaline Phos 38 - 126 U/L 129  118  63   AST 15 - 41 U/L 15  24  19    ALT 0 - 44 U/L 19  40  21       RADIOGRAPHIC STUDIES: I have personally reviewed the radiological images as listed and agreed with the findings in the report. No results found.    No orders of the defined types were placed in this encounter.  All questions were answered. The patient knows to call the clinic with any problems, questions or concerns. No barriers to learning was detected. The total time spent in the appointment was 25 minutes, including review of chart and various tests results, discussions about plan of care and coordination of care plan     Onita Mattock, MD 11/11/2023

## 2023-11-13 ENCOUNTER — Ambulatory Visit

## 2023-11-13 ENCOUNTER — Inpatient Hospital Stay

## 2023-11-13 VITALS — BP 117/64 | HR 61 | Resp 17

## 2023-11-13 DIAGNOSIS — Z5111 Encounter for antineoplastic chemotherapy: Secondary | ICD-10-CM | POA: Diagnosis not present

## 2023-11-13 DIAGNOSIS — C50912 Malignant neoplasm of unspecified site of left female breast: Secondary | ICD-10-CM

## 2023-11-13 MED ORDER — PEGFILGRASTIM-FPGK 6 MG/0.6ML ~~LOC~~ SOSY
6.0000 mg | PREFILLED_SYRINGE | Freq: Once | SUBCUTANEOUS | Status: AC
  Administered 2023-11-13 (×2): 6 mg via SUBCUTANEOUS
  Filled 2023-11-13: qty 0.6

## 2023-11-18 NOTE — Progress Notes (Signed)
 Pharmacist Chemotherapy Monitoring - Initial Assessment    Anticipated start date: 11/25/23   The following has been reviewed per standard work regarding the patient's treatment regimen: The patient's diagnosis, treatment plan and drug doses, and organ/hematologic function Lab orders and baseline tests specific to treatment regimen  The treatment plan start date, drug sequencing, and pre-medications Prior authorization status  Patient's documented medication list, including drug-drug interaction screen and prescriptions for anti-emetics and supportive care specific to the treatment regimen The drug concentrations, fluid compatibility, administration routes, and timing of the medications to be used The patient's access for treatment and lifetime cumulative dose history, if applicable  The patient's medication allergies and previous infusion related reactions, if applicable   Changes made to treatment plan:  N/A  Follow up needed:  N/A  [name], PharmD Oncology Infusion Pharmacist 11/18/2023 12:18 PM

## 2023-11-24 NOTE — Progress Notes (Unsigned)
 Patient Care Team: Geofm Glade PARAS, MD as PCP - General (Internal Medicine) Tasia Lung, MD as Consulting Physician (Psychiatry) Melodi Lerner, MD as Consulting Physician (Orthopedic Surgery) Barbette Knock, MD as Consulting Physician (Obstetrics and Gynecology) Shannon Agent, MD as Consulting Physician (Radiation Oncology) Lanny Callander, MD as Consulting Physician (Hematology) [facility] as Consulting Physician (Radiology) Lita Lye, OD as Referring Physician (Optometry) Ebbie Cough, MD as Consulting Physician (General Surgery) Glean Stephane BROCKS, RN (Inactive) as Oncology Nurse Navigator Tyree Nanetta SAILOR, RN as Oncology Nurse Navigator  Clinic Day:  11/25/2023  Referring physician: Geofm Glade PARAS, MD  ASSESSMENT & PLAN:   Assessment & Plan: Malignant neoplasm of central portion of left breast in female, estrogen receptor negative (HCC) -cT1bN0M0, stage IB, ER-/PR-/HER2- -breast MRI 09/11/22 detected a non-mass enhancement in L breast but it was felt to be a blood vessel when biopsy was attempted.  A close follow-up breast MRI 12/14/22 showed persistent linear non mass enhancement in the lateral posterior left breast, bx was benign and concordant.  -breast MRI 06/21/2023 showed a 1.0 cm of non mass enhancement/possible enhancing mass with progressive Kinetics which is suspicious for malignancy, biopsy showed grade 2 invasive ductal carcinoma, triple negative.  The area measures 0.8 cm on ultrasound. - She underwent a bilateral mastectomy in the left lymph node dissection on Aug 22, 2023. -Surgical path showed 2.8 cm triple negative left breast adeno carcinoma, negative margins, all 6 lymph nodes were negative. -Adjuvant chemotherapy AC-T, she started on 09/30/2023 -11/25/2023 - 1st dose taxol  today. She completed treatment with adjuvant adriamycin  and cytoxan  on 11/11/2023.  -echocardiogram scheduled for 12/25/2023.     Adjuvant chemotherapy for breast  cancer Patient finished Adjuvant AC on 11/11/2023. Today, she starts weekly Taxol . We discussed common side effects of taxol , which include, but are not limited to, cytopenias, hair loss, neuropathy, myalgias and arthralgias, diarrhea, mucositis, fever, abnormal ECG, low BP, and edema. She is currently scheduled for echocardiogram 12/25/2023. She is using ice baths for hands and feet while taking chemotherapy to prevent peripheral neuropathy in the future. She has prescriptions for compazine  and zofran  and understands how to take them and when.   Constipation She reports intermittent constipation. Has been proactively taking Miralax to prevent emergencies associated with constipation. She reports this as extremely helpful.   Leukocytosis The patients WBC is 25.0 and ANC is 18.5. she was receiving G-CSF injections after each dose chemotherapy AC. Most recent dose G-CSF was 11/13/2023 and most likely cause for leukocytosis. Will continue to monitor closely with every visit.   Anemia Stable anemia with Hgb 9.0 an Hct 27.7. Levels are appropriate for treatment today. Will continue to moniror closely with every visit.   Plan Labs reviewed. -leukocytosis and stable anemia noted. -CMP is unremarkable.  Proceed with taxol  today pending CMP results.  Keep echocardiogram appointment for 12/25/2023.  Labs/flush, follow up, and next cycle adjuvant chemotherapy Taxol  in 1 week.   The patient understands the plans discussed today and is in agreement with them.  She knows to contact our office if she develops concerns prior to her next appointment.  I provided 25 minutes of face-to-face time during this encounter and > 50% was spent counseling as documented under my assessment and plan.    Powell FORBES Lessen, NP  [facility] - A DEPT OF [facility] [address] [place] [address] Dept: [phone] Dept Fax: [phone]  No orders of  the defined types were placed in this encounter.     CHIEF COMPLAINT:  CC: Left breast cancer, ER -  Current Treatment: Adjuvant chemotherapy Adriamycin  and Cytoxan  followed by paclitaxel   INTERVAL HISTORY:  [name] is here today for repeat clinical assessment.  She last saw Dr. Lanny on 11/11/2023.  Today, she will receive first cycle of paclitaxel .  We discussed questions about potential side effects related to starting Taxol . Many side effects similar to those experienced on prior rounds AC. She states that she is doing well in general. She denies chest pain, chest pressure, or shortness of breath. She denies unusual headaches or visual disturbances. She denies abdominal pain, nausea, vomiting, or changes in bowel or bladder habits. She states that she is managing constipation better by being more proactive with taking Miralax every day. She denies fevers or chills. She denies pain. Her appetite is good. Her weight has been stable.  I have reviewed the past medical history, past surgical history, social history and family history with the patient and they are unchanged from previous note.  ALLERGIES:  is allergic to adhesive [tape], chloraprep one step [chlorhexidine  gluconate], latex, shellfish allergy, cephalexin, ampicillin, betadine  [povidone iodine ], and macrodantin [nitrofurantoin].  MEDICATIONS:  Current Outpatient Medications  Medication Sig Dispense Refill   atorvastatin  (LIPITOR) 10 MG tablet Take 0.5 tablets (5 mg total) by mouth daily. 90 tablet 1   clorazepate (TRANXENE) 15 MG tablet Take 7.5-15 mg by mouth See admin instructions. Take 15 mg at bedtime, may take a 7.5 mg dose in the morning as needed anxiety     dexamethasone  (DECADRON ) 4 MG tablet Take 2 tablets (8 mg total) by mouth daily for 3 days. Start the day after doxorubicin /cyclophosphamide  chemotherapy. Take with food. 30 tablet 1   docusate sodium  (COLACE) 250 MG capsule Take 250 mg by mouth daily.     fluconazole   (DIFLUCAN ) 150 MG tablet Take 1 tablet (150 mg total) by mouth daily. 1 tablet 0   levothyroxine  (SYNTHROID ) 125 MCG tablet Take 1 tablet (125 mcg total) by mouth daily. Take 5 days a  week. (Patient taking differently: Take 125 mcg by mouth every Monday, Tuesday, Wednesday, Thursday, and Friday. Take 5 days a  week.) 90 tablet 1   lidocaine -prilocaine  (EMLA ) cream Apply to affected area once 30 g 3   methocarbamol  (ROBAXIN ) 500 MG tablet Take 1 tablet (500 mg total) by mouth every 8 (eight) hours as needed (use for muscle cramps/pain). 30 tablet 0   Multiple Vitamins-Minerals (LUTEIN-ZEAXANTHIN) TABS Take 1 tablet by mouth daily.     ondansetron  (ZOFRAN ) 8 MG tablet Take 1 tab (8 mg) by mouth every 8 hrs as needed for nausea/vomiting. Start third day after doxorubicin /cyclophosphamide  chemotherapy. 30 tablet 1   prochlorperazine  (COMPAZINE ) 10 MG tablet TAKE 1 TABLET(10 MG) BY MOUTH EVERY 6 HOURS AS NEEDED FOR NAUSEA OR VOMITING 30 tablet 1   sulfamethoxazole -trimethoprim  (BACTRIM  DS) 800-160 MG tablet Take 1 tablet by mouth 2 (two) times daily. 10 tablet 0   traMADol (ULTRAM) 50 MG tablet Take 50 mg by mouth every 6 (six) hours as needed.     traZODone  (DESYREL ) 100 MG tablet Take 200 mg by mouth at bedtime.     VITAMIN D  PO Place 1 Dose under the tongue daily. drops     No current facility-administered medications for this visit.    HISTORY OF PRESENT ILLNESS:   Oncology History  Breast cancer, BRCA2 positive, left (HCC)  04/15/2017 Initial Diagnosis  Breast cancer, BRCA2 positive, left (HCC)   09/30/2023 -  Chemotherapy   Patient is on Treatment Plan : BREAST DOSE DENSE AC q14d / PACLitaxel  q7d     Malignant neoplasm of central portion of right breast in female, estrogen receptor positive (HCC)  09/12/2018 Initial Diagnosis   Ductal carcinoma in situ (DCIS) of right breast   09/17/2018 Cancer Staging   Staging form: Breast, AJCC 8th Edition - Clinical: Stage 0 (cTis (DCIS), cN0, cM0,  ER+, PR+, HER2: Not Assessed) - Signed by Crawford Morna Pickle, NP on 09/17/2018   10/02/2018 Cancer Staging   Staging form: Breast, AJCC 8th Edition - Pathologic stage from 10/02/2018: Stage IA (pT1b, pN0, cM0, G2, ER+, PR+, HER2-) - Signed by Crawford Morna Pickle, NP on 10/15/2018   Malignant neoplasm of central portion of left breast in female, estrogen receptor negative (HCC)  07/15/2023 Cancer Staging   Staging form: Breast, AJCC 8th Edition - Clinical stage from 07/15/2023: Stage IB (cT1b, cN0, cM0, G2, ER-, PR-, HER2-) - Signed by Lanny Callander, MD on 07/17/2023 Stage prefix: Initial diagnosis Histologic grading system: 3 grade system   07/17/2023 Initial Diagnosis   Malignant neoplasm of central portion of left breast in female, estrogen receptor negative (HCC)       REVIEW OF SYSTEMS:   Constitutional: Denies fevers, chills or abnormal weight loss Eyes: Denies blurriness of vision Ears, nose, mouth, throat, and face: Denies mucositis or sore throat Respiratory: Denies cough, dyspnea or wheezes Cardiovascular: Denies palpitation, chest discomfort or lower extremity swelling Gastrointestinal:  Denies nausea, heartburn or change in bowel habits Skin: Denies abnormal skin rashes Lymphatics: Denies new lymphadenopathy or easy bruising Neurological:Denies numbness, tingling or new weaknesses Behavioral/Psych: Mood is stable, no new changes  All other systems were reviewed with the patient and are negative.   VITALS:   Today's Vitals   11/25/23 0908  BP: 118/60  Pulse: 65  Resp: 17  Temp: 98.1 F (36.7 C)  SpO2: 95%  Weight: 144 lb (65.3 kg)   Body mass index is 24.72 kg/m.   Wt Readings from Last 3 Encounters:  11/25/23 144 lb (65.3 kg)  10/28/23 144 lb 3.2 oz (65.4 kg)  10/14/23 143 lb 4.8 oz (65 kg)    Body mass index is 24.72 kg/m.  Performance status (ECOG): 1 - Symptomatic but completely ambulatory  PHYSICAL EXAM:   GENERAL:alert, no distress and  comfortable SKIN: skin color, texture, turgor are normal, no rashes or significant lesions EYES: normal, Conjunctiva are pink and non-injected, sclera clear OROPHARYNX:no exudate, no erythema and lips, buccal mucosa, and tongue normal  NECK: supple, thyroid  normal size, non-tender, without nodularity LYMPH:  no palpable lymphadenopathy in the cervical, axillary or inguinal LUNGS: clear to auscultation and percussion with normal breathing effort HEART: regular rate & rhythm and no murmurs and no lower extremity edema ABDOMEN:abdomen soft, non-tender and normal bowel sounds Musculoskeletal:no cyanosis of digits and no clubbing  NEURO: alert & oriented x 3 with fluent speech, no focal motor/sensory deficits  LABORATORY DATA:  I have reviewed the data as listed    Component Value Date/Time   NA 140 11/25/2023 0843   NA 138 02/15/2016 1229   K 3.6 11/25/2023 0843   K 4.5 02/15/2016 1229   CL 107 11/25/2023 0843   CL 106 07/16/2012 1201   CO2 26 11/25/2023 0843   CO2 26 02/15/2016 1229   GLUCOSE 157 (H) 11/25/2023 0843   GLUCOSE 92 02/15/2016 1229   GLUCOSE 98 07/16/2012 1201  BUN 11 11/25/2023 0843   BUN 13.7 02/15/2016 1229   CREATININE 0.78 11/25/2023 0843   CREATININE 0.9 02/15/2016 1229   CALCIUM  9.0 11/25/2023 0843   CALCIUM  10.4 02/15/2016 1229   PROT 6.1 (L) 11/25/2023 0843   PROT 7.3 02/15/2016 1229   ALBUMIN 4.1 11/25/2023 0843   ALBUMIN 3.9 02/15/2016 1229   AST 12 (L) 11/25/2023 0843   AST 22 02/15/2016 1229   ALT 9 11/25/2023 0843   ALT 25 02/15/2016 1229   ALKPHOS 108 11/25/2023 0843   ALKPHOS 54 02/15/2016 1229   BILITOT 0.3 11/25/2023 0843   BILITOT 0.39 02/15/2016 1229   GFRNONAA >60 11/25/2023 0843   GFRAA >60 03/12/2019 1021    Lab Results  Component Value Date   WBC 25.0 (H) 11/25/2023   NEUTROABS 18.5 (H) 11/25/2023   HGB 9.0 (L) 11/25/2023   HCT 27.7 (L) 11/25/2023   MCV 90.2 11/25/2023   PLT 208 11/25/2023

## 2023-11-24 NOTE — Assessment & Plan Note (Signed)
-  cT1bN0M0, stage IB, ER-/PR-/HER2- -breast MRI 09/11/22 detected a non-mass enhancement in L breast but it was felt to be a blood vessel when biopsy was attempted.  A close follow-up breast MRI 12/14/22 showed persistent linear non mass enhancement in the lateral posterior left breast, bx was benign and concordant.  -breast MRI 06/21/2023 showed a 1.0 cm of non mass enhancement/possible enhancing mass with progressive Kinetics which is suspicious for malignancy, biopsy showed grade 2 invasive ductal carcinoma, triple negative.  The area measures 0.8 cm on ultrasound. - She underwent a bilateral mastectomy in the left lymph node dissection on Aug 22, 2023. -Surgical path showed 2.8 cm triple negative left breast adeno carcinoma, negative margins, all 6 lymph nodes were negative. -Adjuvant chemotherapy AC-T, she started on 09/30/2023 -11/25/2023 - 1st dose taxol  today. She completed treatment with adriamycin  and cytoxan  on 11/11/2023.  -echocardiogram scheduled for 12/25/2023.

## 2023-11-25 ENCOUNTER — Inpatient Hospital Stay

## 2023-11-25 ENCOUNTER — Encounter: Payer: Self-pay | Admitting: Nurse Practitioner

## 2023-11-25 ENCOUNTER — Inpatient Hospital Stay (HOSPITAL_BASED_OUTPATIENT_CLINIC_OR_DEPARTMENT_OTHER): Admitting: Nurse Practitioner

## 2023-11-25 VITALS — BP 118/60 | HR 65 | Temp 98.1°F | Resp 17 | Wt 144.0 lb

## 2023-11-25 VITALS — BP 128/77 | HR 60 | Temp 98.1°F

## 2023-11-25 DIAGNOSIS — C50912 Malignant neoplasm of unspecified site of left female breast: Secondary | ICD-10-CM

## 2023-11-25 DIAGNOSIS — C50112 Malignant neoplasm of central portion of left female breast: Secondary | ICD-10-CM

## 2023-11-25 DIAGNOSIS — Z1501 Genetic susceptibility to malignant neoplasm of breast: Secondary | ICD-10-CM | POA: Diagnosis not present

## 2023-11-25 DIAGNOSIS — Z5111 Encounter for antineoplastic chemotherapy: Secondary | ICD-10-CM | POA: Diagnosis not present

## 2023-11-25 DIAGNOSIS — Z95828 Presence of other vascular implants and grafts: Secondary | ICD-10-CM

## 2023-11-25 DIAGNOSIS — Z171 Estrogen receptor negative status [ER-]: Secondary | ICD-10-CM | POA: Diagnosis not present

## 2023-11-25 LAB — CMP (CANCER CENTER ONLY)
ALT: 9 U/L (ref 0–44)
AST: 12 U/L — ABNORMAL LOW (ref 15–41)
Albumin: 4.1 g/dL (ref 3.5–5.0)
Alkaline Phosphatase: 108 U/L (ref 38–126)
Anion gap: 7 (ref 5–15)
BUN: 11 mg/dL (ref 8–23)
CO2: 26 mmol/L (ref 22–32)
Calcium: 9 mg/dL (ref 8.9–10.3)
Chloride: 107 mmol/L (ref 98–111)
Creatinine: 0.78 mg/dL (ref 0.44–1.00)
GFR, Estimated: >60 mL/min (ref >60)
Glucose, Bld: 157 mg/dL — ABNORMAL HIGH (ref 70–99)
Potassium: 3.6 mmol/L (ref 3.5–5.1)
Sodium: 140 mmol/L (ref 135–145)
Total Bilirubin: 0.3 mg/dL (ref 0.0–1.2)
Total Protein: 6.1 g/dL — ABNORMAL LOW (ref 6.5–8.1)

## 2023-11-25 LAB — CBC WITH DIFFERENTIAL (CANCER CENTER ONLY)
Abs Immature Granulocytes: 4.15 K/uL — ABNORMAL HIGH (ref 0.00–0.07)
Basophils Absolute: 0.2 K/uL — ABNORMAL HIGH (ref 0.0–0.1)
Basophils Relative: 1 %
Eosinophils Absolute: 0.1 K/uL (ref 0.0–0.5)
Eosinophils Relative: 0 %
HCT: 27.7 % — ABNORMAL LOW (ref 36.0–46.0)
Hemoglobin: 9 g/dL — ABNORMAL LOW (ref 12.0–15.0)
Immature Granulocytes: 17 %
Lymphocytes Relative: 4 %
Lymphs Abs: 1 K/uL (ref 0.7–4.0)
MCH: 29.3 pg (ref 26.0–34.0)
MCHC: 32.5 g/dL (ref 30.0–36.0)
MCV: 90.2 fL (ref 80.0–100.0)
Monocytes Absolute: 1.1 K/uL — ABNORMAL HIGH (ref 0.1–1.0)
Monocytes Relative: 5 %
Neutro Abs: 18.5 K/uL — ABNORMAL HIGH (ref 1.7–7.7)
Neutrophils Relative %: 73 %
Platelet Count: 208 K/uL (ref 150–400)
RBC: 3.07 MIL/uL — ABNORMAL LOW (ref 3.87–5.11)
RDW: 17.7 % — ABNORMAL HIGH (ref 11.5–15.5)
Smear Review: NORMAL
WBC Count: 25 K/uL — ABNORMAL HIGH (ref 4.0–10.5)
nRBC: 0.3 % — ABNORMAL HIGH (ref 0.0–0.2)

## 2023-11-25 MED ORDER — SODIUM CHLORIDE 0.9% FLUSH
10.0000 mL | Freq: Once | INTRAVENOUS | Status: AC
Start: 2023-11-25 — End: 2023-11-25
  Administered 2023-11-25: 10 mL

## 2023-11-25 MED ORDER — DEXAMETHASONE 4 MG PO TABS: ORAL_TABLET | ORAL | 1 refills | Status: DC

## 2023-11-25 MED ORDER — DEXAMETHASONE SODIUM PHOSPHATE 10 MG/ML IJ SOLN
10.0000 mg | Freq: Once | INTRAMUSCULAR | Status: AC
  Administered 2023-11-25: 10 mg via INTRAVENOUS
  Filled 2023-11-25: qty 1

## 2023-11-25 MED ORDER — SODIUM CHLORIDE 0.9 % IV SOLN
80.0000 mg/m2 | Freq: Once | INTRAVENOUS | Status: AC
  Administered 2023-11-25: 138 mg via INTRAVENOUS
  Filled 2023-11-25: qty 23

## 2023-11-25 MED ORDER — SODIUM CHLORIDE 0.9% FLUSH: 10.0000 mL | INTRAVENOUS | Status: DC | PRN

## 2023-11-25 MED ORDER — FAMOTIDINE IN NACL 20-0.9 MG/50ML-% IV SOLN
20.0000 mg | Freq: Once | INTRAVENOUS | Status: AC
  Administered 2023-11-25: 20 mg via INTRAVENOUS
  Filled 2023-11-25: qty 50

## 2023-11-25 MED ORDER — DIPHENHYDRAMINE HCL 50 MG/ML IJ SOLN
50.0000 mg | Freq: Once | INTRAMUSCULAR | Status: AC
  Administered 2023-11-25: 50 mg via INTRAVENOUS
  Filled 2023-11-25: qty 1

## 2023-11-25 MED ORDER — SODIUM CHLORIDE 0.9 % IV SOLN: INTRAVENOUS | Status: DC

## 2023-11-25 NOTE — Patient Instructions (Signed)

## 2023-11-26 ENCOUNTER — Encounter: Payer: Self-pay | Admitting: Hematology

## 2023-11-26 NOTE — Telephone Encounter (Signed)
-----   Message from Nurse Emmalee P sent at 11/25/2023  2:36 PM EDT ----- Regarding: Dr. Lanny pt- First time Taxol  call back Dr Lanny pt, first time Paclitaxel  (11/25/23). Titrated per protocol and tolerated treatment well. Stable at discharge.

## 2023-11-26 NOTE — Telephone Encounter (Signed)
 Called pt to see how she did with her recent treatment.  She reports doing well & denies any problems/concerns.  She has taken her nausea meds & is taking decadron  per NP but hasn't needed any nausea med today.  She knows her next appts & how to reach us  if needed.

## 2023-11-29 ENCOUNTER — Ambulatory Visit: Payer: Self-pay | Attending: General Surgery | Admitting: Rehabilitation

## 2023-11-29 DIAGNOSIS — C50412 Malignant neoplasm of upper-outer quadrant of left female breast: Secondary | ICD-10-CM | POA: Insufficient documentation

## 2023-11-29 DIAGNOSIS — Z9189 Other specified personal risk factors, not elsewhere classified: Secondary | ICD-10-CM | POA: Insufficient documentation

## 2023-11-29 DIAGNOSIS — Z483 Aftercare following surgery for neoplasm: Secondary | ICD-10-CM | POA: Insufficient documentation

## 2023-11-29 DIAGNOSIS — Z170 Estrogen receptor positive status [ER+]: Secondary | ICD-10-CM | POA: Insufficient documentation

## 2023-11-29 NOTE — Therapy (Signed)
 OUTPATIENT PHYSICAL THERAPY SOZO SCREENING NOTE   Patient Name: [name] MRN: [id number] DOB:Jan 31, 1956, 68 y.o., female Today's Date: 11/29/2023  PCP: Geofm Glade PARAS, MD REFERRING PROVIDER: Ebbie Cough, MD   PT End of Session - 11/29/23 [phone]     Visit Number 7   screen only   PT Start Time 0808    PT Stop Time 0815    PT Time Calculation (min) 7 min    Activity Tolerance Patient tolerated treatment well    Behavior During Therapy [facility] for tasks assessed/performed          Past Medical History:  Diagnosis Date   Anxiety    Anxiety disorder    BRCA2 positive 07/14/2012   Cancer (HCC) 2008   left breast. Right Breast Cancer in 2020   Dyslipidemia    Fall    Goiter    History of radiation therapy    Hypothyroidism    Osteoporosis    Past Surgical History:  Procedure Laterality Date   ABDOMINAL HYSTERECTOMY  2009   with BSO   BREAST LUMPECTOMY  7991,8023, 1991   BREAST LUMPECTOMY WITH RADIOACTIVE SEED LOCALIZATION Right 10/02/2018   Procedure: RIGHT BREAST LUMPECTOMY WITH RADIOACTIVE SEED LOCALIZATION;  Surgeon: Curvin Deward MOULD, MD;  Location:  SURGERY CENTER;  Service: General;  Laterality: Right;   COLONOSCOPY N/A 12/13/2015   Procedure: COLONOSCOPY;  Surgeon: Gladis MARLA Louder, MD;  Location: [facility];  Service: Endoscopy;  Laterality: N/A;   COLONOSCOPY  2023   FRACTURE SURGERY  2015   left hip   HARDWARE REMOVAL Left 07/08/2015   Procedure: LEFT HIP HARDWARE REMOVAL;  Surgeon: Dempsey Moan, MD;  Location: [facility];  Service: Orthopedics;  Laterality: Left;   MASTECTOMY W/ SENTINEL NODE BIOPSY Left 08/22/2023   Procedure: MASTECTOMY WITH SENTINEL LYMPH NODE BIOPSY;  Surgeon: Ebbie Cough, MD;  Location: [facility] OR;  Service: General;  Laterality: Left;  LMA w/ PEC BLOCK 150 MINUTES LEFT MASTECTOMY LEFT AXILLARY SENTINEL NODE BIOPSY MAGTRACE AND BLUE DYE INJECTION   PORTACATH PLACEMENT N/A 08/22/2023   Procedure: INSERTION, TUNNELED  CENTRAL VENOUS DEVICE, WITH PORT;  Surgeon: Ebbie Cough, MD;  Location: [facility] OR;  Service: General;  Laterality: N/A;  PORT PLACEMENT WITH ULTRASOUND GUIDANCE   TOTAL MASTECTOMY Right 08/22/2023   Procedure: MASTECTOMY, SIMPLE;  Surgeon: Ebbie Cough, MD;  Location: [facility] OR;  Service: General;  Laterality: Right;  RIGHT RISK REDUCING MASTECTOMY   Patient Active Problem List   Diagnosis Date Noted   Port-A-Cath in place 09/23/2023   S/P bilateral mastectomy 08/22/2023   Malignant neoplasm of central portion of left breast in female, estrogen receptor negative (HCC) 07/17/2023   Anxiety 11/19/2022   Osteoporosis - [facility] endocr[facility]ology 11/18/2022   Hypothyroidism 11/18/2022   Hyperlipidemia 11/18/2022   BRCA2 gene mutation positive in female 06/10/2022   Malignant neoplasm of central portion of right breast in female, estrogen receptor positive (HCC) 09/12/2018   Breast cancer, BRCA2 positive, left (HCC) 04/15/2017   Breast cancer screening, high risk patient 04/15/2017   Malignant neoplasm of upper-outer quadrant of left breast in female, estrogen receptor positive (HCC) 01/29/2013    REFERRING DIAG: left breast cancer at risk for lymphedema  THERAPY DIAG:  Malignant neoplasm of upper-outer quadrant of left breast in female, estrogen receptor positive (HCC)  Aftercare following surgery for neoplasm  At risk for lymphedema  PERTINENT HISTORY: Left breast Grade 2 IDC, triple negative +BRCA2. Bil mastectomy 08/22/23 with 6 negative nodes. Will have chemo AC-P.  Hx of 2 other episodes of breast cancer 2008 in left breast and 2020 in Rt breast. Lymph nodes only out on the left, 2 out previously for a total of 8. Never anything out on the right. Hx of radiation bilaterally.   PRECAUTIONS: left UE Lymphedema risk, None  SUBJECTIVE: I have been having intermittent pain and swelling around the elbow.   PAIN:  Are you having pain? No  SOZO SCREENING: Patient was assessed today using the  SOZO machine to determine the lymphedema index score. This was compared to her baseline score. It was determined that she is within the recommended range when compared to her baseline and no further action is needed at this time. She will continue SOZO screenings. These are done every 3 months for 2 years post operatively followed by every 6 months for 2 years, and then annually.  Rescheduled PT visits due to new symptoms and requested a new prescription from Dr. Ebbie.    Larue Saddie SAUNDERS, PT 11/29/2023, 9:59 AM

## 2023-12-02 NOTE — Progress Notes (Unsigned)
 [facility]   Telephone:([phone] Fax:([phone]) [phone]    Patient Care Team: Geofm Glade PARAS, MD as PCP - General (Internal Medicine) Tasia Lung, MD as Consulting Physician (Psychiatry) Melodi Lerner, MD as Consulting Physician (Orthopedic Surgery) Barbette Knock, MD as Consulting Physician (Obstetrics and Gynecology) Shannon Agent, MD as Consulting Physician (Radiation Oncology) Lanny Callander, MD as Consulting Physician (Hematology) [facility] as Consulting Physician (Radiology) Lita Lye, OD as Referring Physician (Optometry) Ebbie Cough, MD as Consulting Physician (General Surgery) Glean Stephane BROCKS, RN (Inactive) as Oncology Nurse Navigator Tyree Nanetta SAILOR, RN as Oncology Nurse Navigator   CHIEF COMPLAINT: Follow up BRCA2 positive, triple negative breast cancer   Oncology History  Breast cancer, BRCA2 positive, left (HCC)  04/15/2017 Initial Diagnosis   Breast cancer, BRCA2 positive, left (HCC)   09/30/2023 -  Chemotherapy   Patient is on Treatment Plan : BREAST DOSE DENSE AC q14d / PACLitaxel  q7d     Malignant neoplasm of central portion of right breast in female, estrogen receptor positive (HCC)  09/12/2018 Initial Diagnosis   Ductal carcinoma in situ (DCIS) of right breast   09/17/2018 Cancer Staging   Staging form: Breast, AJCC 8th Edition - Clinical: Stage 0 (cTis (DCIS), cN0, cM0, ER+, PR+, HER2: Not Assessed) - Signed by Crawford Morna Pickle, NP on 09/17/2018   10/02/2018 Cancer Staging   Staging form: Breast, AJCC 8th Edition - Pathologic stage from 10/02/2018: Stage IA (pT1b, pN0, cM0, G2, ER+, PR+, HER2-) - Signed by Crawford Morna Pickle, NP on 10/15/2018   Malignant neoplasm of central portion of left breast in female, estrogen receptor negative (HCC)  07/15/2023 Cancer Staging   Staging form: Breast, AJCC 8th Edition - Clinical stage from 07/15/2023: Stage IB (cT1b, cN0, cM0, G2, ER-, PR-, HER2-) -  Signed by Lanny Callander, MD on 07/17/2023 Stage prefix: Initial diagnosis Histologic grading system: 3 grade system   07/17/2023 Initial Diagnosis   Malignant neoplasm of central portion of left breast in female, estrogen receptor negative (HCC)      CURRENT THERAPY: Adjuvant chemo, AC-T starting 09/30/23  INTERVAL HISTORY [name] returns for follow up as scheduled. Seen last week with cycle 1 Taxol .   ROS   Past Medical History:  Diagnosis Date   Anxiety    Anxiety disorder    BRCA2 positive 07/14/2012   Cancer (HCC) 2008   left breast. Right Breast Cancer in 2020   Dyslipidemia    Fall    Goiter    History of radiation therapy    Hypothyroidism    Osteoporosis      Past Surgical History:  Procedure Laterality Date   ABDOMINAL HYSTERECTOMY  2009   with BSO   BREAST LUMPECTOMY  [id number], 1991   BREAST LUMPECTOMY WITH RADIOACTIVE SEED LOCALIZATION Right 10/02/2018   Procedure: RIGHT BREAST LUMPECTOMY WITH RADIOACTIVE SEED LOCALIZATION;  Surgeon: Curvin Deward MOULD, MD;  Location: [facility];  Service: General;  Laterality: Right;   COLONOSCOPY N/A 12/13/2015   Procedure: COLONOSCOPY;  Surgeon: Gladis MARLA Louder, MD;  Location: [facility];  Service: Endoscopy;  Laterality: N/A;   COLONOSCOPY  2023   FRACTURE SURGERY  2015   left hip   HARDWARE REMOVAL Left 07/08/2015   Procedure: LEFT HIP HARDWARE REMOVAL;  Surgeon: Lerner Melodi, MD;  Location: [facility];  Service: Orthopedics;  Laterality: Left;   MASTECTOMY W/ SENTINEL NODE BIOPSY Left 08/22/2023   Procedure: MASTECTOMY WITH SENTINEL LYMPH NODE BIOPSY;  Surgeon: Ebbie Cough, MD;  Location: [facility] OR;  Service: General;  Laterality: Left;  LMA w/ PEC BLOCK 150 MINUTES LEFT MASTECTOMY LEFT AXILLARY SENTINEL NODE BIOPSY MAGTRACE AND BLUE DYE INJECTION   PORTACATH PLACEMENT N/A 08/22/2023   Procedure: INSERTION, TUNNELED CENTRAL VENOUS DEVICE, WITH PORT;  Surgeon: Ebbie Cough, MD;  Location[facility] OR;   Service: General;  Laterality: N/A;  PORT PLACEMENT WITH ULTRASOUND GUIDANCE   TOTAL MASTECTOMY Right 08/22/2023   Procedure: MASTECTOMY, SIMPLE;  Surgeon: Ebbie Cough, MD;  Location: [facility] OR;  Service: General;  Laterality: Right;  RIGHT RISK REDUCING MASTECTOMY     Outpatient Encounter Medications as of 12/03/2023  Medication Sig   atorvastatin  (LIPITOR) 10 MG tablet Take 0.5 tablets (5 mg total) by mouth daily.   clorazepate (TRANXENE) 15 MG tablet Take 7.5-15 mg by mouth See admin instructions. Take 15 mg at bedtime, may take a 7.5 mg dose in the morning as needed anxiety   dexamethasone  (DECADRON ) 4 MG tablet Take 2 tablets (8 mg total) by mouth daily for 3 days. Start the day after doxorubicin /cyclophosphamide  chemotherapy. Take with food.   docusate sodium  (COLACE) 250 MG capsule Take 250 mg by mouth daily.   fluconazole  (DIFLUCAN ) 150 MG tablet Take 1 tablet (150 mg total) by mouth daily.   levothyroxine  (SYNTHROID ) 125 MCG tablet Take 1 tablet (125 mcg total) by mouth daily. Take 5 days a  week. (Patient taking differently: Take 125 mcg by mouth every Monday, Tuesday, Wednesday, Thursday, and Friday. Take 5 days a  week.)   lidocaine -prilocaine  (EMLA ) cream Apply to affected area once   methocarbamol  (ROBAXIN ) 500 MG tablet Take 1 tablet (500 mg total) by mouth every 8 (eight) hours as needed (use for muscle cramps/pain).   Multiple Vitamins-Minerals (LUTEIN-ZEAXANTHIN) TABS Take 1 tablet by mouth daily.   ondansetron  (ZOFRAN ) 8 MG tablet Take 1 tab (8 mg) by mouth every 8 hrs as needed for nausea/vomiting. Start third day after doxorubicin /cyclophosphamide  chemotherapy.   prochlorperazine  (COMPAZINE ) 10 MG tablet TAKE 1 TABLET(10 MG) BY MOUTH EVERY 6 HOURS AS NEEDED FOR NAUSEA OR VOMITING   sulfamethoxazole -trimethoprim  (BACTRIM  DS) 800-160 MG tablet Take 1 tablet by mouth 2 (two) times daily.   traMADol (ULTRAM) 50 MG tablet Take 50 mg by mouth every 6 (six) hours as needed.    traZODone  (DESYREL ) 100 MG tablet Take 200 mg by mouth at bedtime.   VITAMIN D  PO Place 1 Dose under the tongue daily. drops   No facility-administered encounter medications on file as of 12/03/2023.     There were no vitals filed for this visit. There is no height or weight on file to calculate BMI.   ECOG PERFORMANCE STATUS: {CHL ONC ECOG PS:[phone]}  PHYSICAL EXAM GENERAL:alert, no distress and comfortable SKIN: no rash  EYES: sclera clear NECK: without mass LYMPH:  no palpable cervical or supraclavicular lymphadenopathy  LUNGS: clear with normal breathing effort HEART: regular rate & rhythm, no lower extremity edema ABDOMEN: abdomen soft, non-tender and normal bowel sounds NEURO: alert & oriented x 3 with fluent speech, no focal motor/sensory deficits Breast exam:  PAC without erythema    CBC    Latest Ref Rng & Units 11/25/2023    8:43 AM 11/11/2023    9:24 AM 10/28/2023   10:09 AM  CBC  WBC 4.0 - 10.5 K/uL 25.0  24.0  22.1   Hemoglobin 12.0 - 15.0 g/dL 9.0  9.0  9.8   Hematocrit 36.0 - 46.0 % 27.7  27.8  29.5   Platelets 150 - 400 K/uL 208  211  188       CMP     Latest Ref Rng & Units 11/25/2023    8:43 AM 11/11/2023    9:24 AM 10/28/2023   10:09 AM  CMP  Glucose 70 - 99 mg/dL 842  851  895   BUN 8 - 23 mg/dL 11  11  10    Creatinine 0.44 - 1.00 mg/dL 9.21  9.16  9.13   Sodium 135 - 145 mmol/L 140  140  141   Potassium 3.5 - 5.1 mmol/L 3.6  3.9  3.8   Chloride 98 - 111 mmol/L 107  105  106   CO2 22 - 32 mmol/L 26  28  29    Calcium  8.9 - 10.3 mg/dL 9.0  9.2  9.5   Total Protein 6.5 - 8.1 g/dL 6.1  6.4  6.8   Total Bilirubin 0.0 - 1.2 mg/dL 0.3  0.3  0.3   Alkaline Phos 38 - 126 U/L 108  113  129   AST 15 - 41 U/L 12  12  15    ALT 0 - 44 U/L 9  11  19        ASSESSMENT & PLAN:st hysterectomy female with    Malignant neoplasm of central portion of left breast in female, estrogen receptor negative, Ki67 80% -cT1bN0M0, stage IB, ER-/PR-/HER2- -breast MRI  09/11/22 detected a non-mass enhancement in L breast but it was felt to be a blood vessel when biopsy was attempted.  A close follow-up breast MRI 12/14/22 showed persistent linear non mass enhancement in the lateral posterior left breast, bx was benign and concordant.  -breast MRI 06/21/2023 showed a 1.0 cm of non mass enhancement/possible enhancing mass with progressive kinetics which is suspicious for malignancy, biopsy showed grade 2 invasive ductal carcinoma, triple negative.  The area measures 0.8 cm on ultrasound. - S/p port placement and bilateral mastectomy with L SLNB Aug 22, 2023 [name]). Surgical path showed a grade 3, 2.8 cm triple negative left breast adeno carcinoma, negative margins, all 6 lymph nodes were negative. -Staging CT CAP/bone scan negative, baseline echo is normal - Postop course complicated by cellulitis (R mastectomy site/flap) and open port incision, healing. Cleared to begin chemo by Dr. Ebbie first cycle 09/30/23   Malignant neoplasm of central portion of right breast, stage 1, pT1b, cN0 IDC, grade 2 -Initially diagnosed with DCIS 09/10/18. Lumpectomy 10/02/18 showed invasive ductal carcinoma. S/p radiation 11/06/18 - 12/25/18.  -Took exemestane  01/2019 - 03/2021, discontinued due to osteoporosis and small benefit. -Mammo 03/2022 was benign, breast MRI 09/11/22 detected a L breast non mass enhancement and incidental opacity in the R lung.  -Follow up CXR image is clear to me, report notes the MRI finding is not well  visualized. She has no respiratory symptoms, non smoker. We decided to hold on CT chest.  -She went back for MRI guided Bx of L br on 09/14/22 but the area appeared more like a vessel, and bx was not done. Short term MRI 12/14/22 showed persistent linear non mass enhancement in the lateral posterior left breast, bx was benign and concordant.   H/o Left breast stage T2 N0 IDC, ER+/PR+/Her2-  -s/p lumpectomy 03/2007. Oncotype RS 20, low risk.  -Took antiestrogens  from 07/2007 - 09/2018 (Arimidex , tamoxifen , and raloxifene ).   BRCA2+ -found to be BRCA2+ at time of first breast cancer. Bilateral mastectomies not felt to be necessary at that time, but she did  undergo TAH w/ BSO 10/2007. -She has adult 2 sons, both tested positive and followed by oncologists where they live -We continue to discuss pancreatic screening and I previously discussed with her GI Dr. Legrand.  -A maternal first cousin who was BRCA+ had pancreatic cancer.  -Guidelines/data are not definitive but recommends screening if BRCA2+ with 1 first degree relative or 2 non-first degree relatives -CT CAP for breast cancer staging showed normal pancreas   Osteoporosis -most recent DEXA 03/23/21 showed T-score -2.9 at right hip. Though density in lumbar spine worsened some, her hip slightly improved from -3.1 in 03/2020. -she was previously on fosamax for many years then prolia injections for one year.  Currently on a treatment holiday -follows with Dr. Melodye at [facility].      PLAN:  No orders of the defined types were placed in this encounter.     All questions were answered. The patient knows to call the clinic with any problems, questions or concerns. No barriers to learning were detected. I spent *** counseling the patient face to face. The total time spent in the appointment was *** and more than 50% was on counseling, review of test results, and coordination of care.   Yoltzin Ransom K Katlyn Muldrew, NP 12/02/2023 10:06 AM

## 2023-12-03 ENCOUNTER — Inpatient Hospital Stay (HOSPITAL_BASED_OUTPATIENT_CLINIC_OR_DEPARTMENT_OTHER): Admitting: Nurse Practitioner

## 2023-12-03 ENCOUNTER — Inpatient Hospital Stay: Attending: Hematology

## 2023-12-03 ENCOUNTER — Encounter: Payer: Self-pay | Admitting: Nurse Practitioner

## 2023-12-03 ENCOUNTER — Inpatient Hospital Stay

## 2023-12-03 VITALS — BP 121/62 | HR 68 | Temp 98.3°F | Resp 17 | Wt 145.2 lb

## 2023-12-03 DIAGNOSIS — C50111 Malignant neoplasm of central portion of right female breast: Secondary | ICD-10-CM | POA: Diagnosis not present

## 2023-12-03 DIAGNOSIS — Z86000 Personal history of in-situ neoplasm of breast: Secondary | ICD-10-CM | POA: Insufficient documentation

## 2023-12-03 DIAGNOSIS — Z1722 Progesterone receptor negative status: Secondary | ICD-10-CM | POA: Insufficient documentation

## 2023-12-03 DIAGNOSIS — M810 Age-related osteoporosis without current pathological fracture: Secondary | ICD-10-CM | POA: Diagnosis not present

## 2023-12-03 DIAGNOSIS — Z1732 Human epidermal growth factor receptor 2 negative status: Secondary | ICD-10-CM | POA: Insufficient documentation

## 2023-12-03 DIAGNOSIS — R0609 Other forms of dyspnea: Secondary | ICD-10-CM | POA: Diagnosis not present

## 2023-12-03 DIAGNOSIS — R197 Diarrhea, unspecified: Secondary | ICD-10-CM | POA: Diagnosis not present

## 2023-12-03 DIAGNOSIS — K5900 Constipation, unspecified: Secondary | ICD-10-CM | POA: Insufficient documentation

## 2023-12-03 DIAGNOSIS — Z79890 Hormone replacement therapy (postmenopausal): Secondary | ICD-10-CM | POA: Diagnosis not present

## 2023-12-03 DIAGNOSIS — Z9013 Acquired absence of bilateral breasts and nipples: Secondary | ICD-10-CM | POA: Diagnosis not present

## 2023-12-03 DIAGNOSIS — C50912 Malignant neoplasm of unspecified site of left female breast: Secondary | ICD-10-CM

## 2023-12-03 DIAGNOSIS — E785 Hyperlipidemia, unspecified: Secondary | ICD-10-CM | POA: Diagnosis not present

## 2023-12-03 DIAGNOSIS — D6481 Anemia due to antineoplastic chemotherapy: Secondary | ICD-10-CM | POA: Insufficient documentation

## 2023-12-03 DIAGNOSIS — Z923 Personal history of irradiation: Secondary | ICD-10-CM | POA: Insufficient documentation

## 2023-12-03 DIAGNOSIS — Z9012 Acquired absence of left breast and nipple: Secondary | ICD-10-CM | POA: Diagnosis not present

## 2023-12-03 DIAGNOSIS — T451X5A Adverse effect of antineoplastic and immunosuppressive drugs, initial encounter: Secondary | ICD-10-CM | POA: Diagnosis not present

## 2023-12-03 DIAGNOSIS — Z79899 Other long term (current) drug therapy: Secondary | ICD-10-CM | POA: Diagnosis not present

## 2023-12-03 DIAGNOSIS — Z79632 Long term (current) use of antitumor antibiotic: Secondary | ICD-10-CM | POA: Diagnosis not present

## 2023-12-03 DIAGNOSIS — Z800 Family history of malignant neoplasm of digestive organs: Secondary | ICD-10-CM | POA: Insufficient documentation

## 2023-12-03 DIAGNOSIS — Z7952 Long term (current) use of systemic steroids: Secondary | ICD-10-CM | POA: Diagnosis not present

## 2023-12-03 DIAGNOSIS — R5383 Other fatigue: Secondary | ICD-10-CM | POA: Diagnosis not present

## 2023-12-03 DIAGNOSIS — Z171 Estrogen receptor negative status [ER-]: Secondary | ICD-10-CM | POA: Insufficient documentation

## 2023-12-03 DIAGNOSIS — Z79630 Long term (current) use of alkylating agent: Secondary | ICD-10-CM | POA: Diagnosis not present

## 2023-12-03 DIAGNOSIS — Z5111 Encounter for antineoplastic chemotherapy: Secondary | ICD-10-CM | POA: Insufficient documentation

## 2023-12-03 DIAGNOSIS — Z1501 Genetic susceptibility to malignant neoplasm of breast: Secondary | ICD-10-CM | POA: Insufficient documentation

## 2023-12-03 DIAGNOSIS — E039 Hypothyroidism, unspecified: Secondary | ICD-10-CM | POA: Insufficient documentation

## 2023-12-03 LAB — CBC WITH DIFFERENTIAL (CANCER CENTER ONLY)
Abs Immature Granulocytes: 0.17 K/uL — ABNORMAL HIGH (ref 0.00–0.07)
Basophils Absolute: 0.1 K/uL (ref 0.0–0.1)
Basophils Relative: 1 %
Eosinophils Absolute: 0.1 K/uL (ref 0.0–0.5)
Eosinophils Relative: 1 %
HCT: 26.7 % — ABNORMAL LOW (ref 36.0–46.0)
Hemoglobin: 8.8 g/dL — ABNORMAL LOW (ref 12.0–15.0)
Immature Granulocytes: 3 %
Lymphocytes Relative: 11 %
Lymphs Abs: 0.7 K/uL (ref 0.7–4.0)
MCH: 29.8 pg (ref 26.0–34.0)
MCHC: 33 g/dL (ref 30.0–36.0)
MCV: 90.5 fL (ref 80.0–100.0)
Monocytes Absolute: 0.5 K/uL (ref 0.1–1.0)
Monocytes Relative: 9 %
Neutro Abs: 4.7 K/uL (ref 1.7–7.7)
Neutrophils Relative %: 75 %
Platelet Count: 315 K/uL (ref 150–400)
RBC: 2.95 MIL/uL — ABNORMAL LOW (ref 3.87–5.11)
RDW: 17.3 % — ABNORMAL HIGH (ref 11.5–15.5)
WBC Count: 6.2 K/uL (ref 4.0–10.5)
nRBC: 0 % (ref 0.0–0.2)

## 2023-12-03 LAB — CMP (CANCER CENTER ONLY)
ALT: 12 U/L (ref 0–44)
AST: 10 U/L — ABNORMAL LOW (ref 15–41)
Albumin: 3.8 g/dL (ref 3.5–5.0)
Alkaline Phosphatase: 64 U/L (ref 38–126)
Anion gap: 8 (ref 5–15)
BUN: 13 mg/dL (ref 8–23)
CO2: 24 mmol/L (ref 22–32)
Calcium: 8.7 mg/dL — ABNORMAL LOW (ref 8.9–10.3)
Chloride: 108 mmol/L (ref 98–111)
Creatinine: 0.68 mg/dL (ref 0.44–1.00)
GFR, Estimated: >60 mL/min (ref >60)
Glucose, Bld: 163 mg/dL — ABNORMAL HIGH (ref 70–99)
Potassium: 3.6 mmol/L (ref 3.5–5.1)
Sodium: 140 mmol/L (ref 135–145)
Total Bilirubin: 0.3 mg/dL (ref 0.0–1.2)
Total Protein: 6 g/dL — ABNORMAL LOW (ref 6.5–8.1)

## 2023-12-03 MED ORDER — SODIUM CHLORIDE 0.9 % IV SOLN
80.0000 mg/m2 | Freq: Once | INTRAVENOUS | Status: AC
  Administered 2023-12-03: 138 mg via INTRAVENOUS
  Filled 2023-12-03: qty 23

## 2023-12-03 MED ORDER — SODIUM CHLORIDE 0.9 % IV SOLN: INTRAVENOUS | Status: DC

## 2023-12-03 MED ORDER — SODIUM CHLORIDE 0.9% FLUSH: 10.0000 mL | INTRAVENOUS | Status: DC | PRN

## 2023-12-03 MED ORDER — DEXAMETHASONE SODIUM PHOSPHATE 10 MG/ML IJ SOLN
10.0000 mg | Freq: Once | INTRAMUSCULAR | Status: AC
  Administered 2023-12-03: 10 mg via INTRAVENOUS
  Filled 2023-12-03: qty 1

## 2023-12-03 MED ORDER — FAMOTIDINE IN NACL 20-0.9 MG/50ML-% IV SOLN
20.0000 mg | Freq: Once | INTRAVENOUS | Status: AC
  Administered 2023-12-03: 20 mg via INTRAVENOUS
  Filled 2023-12-03: qty 50

## 2023-12-03 MED ORDER — DIPHENHYDRAMINE HCL 50 MG/ML IJ SOLN
50.0000 mg | Freq: Once | INTRAMUSCULAR | Status: AC
  Administered 2023-12-03: 50 mg via INTRAVENOUS
  Filled 2023-12-03: qty 1

## 2023-12-03 NOTE — Patient Instructions (Signed)
 [facility] - A DEPT OF . [facility]  Discharge Instructions: Thank you for choosing [facility] to provide your oncology and hematology care.   If you have a lab appointment with the Cancer Center, please go directly to the Cancer Center and check in at the registration area.   Wear comfortable clothing and clothing appropriate for easy access to any Portacath or PICC line.   We strive to give you quality time with your provider. You may need to reschedule your appointment if you arrive late (15 or more minutes).  Arriving late affects you and other patients whose appointments are after yours.  Also, if you miss three or more appointments without notifying the office, you may be dismissed from the clinic at the provider's discretion.      For prescription refill requests, have your pharmacy contact our office and allow 72 hours for refills to be completed.    Today you received the following chemotherapy and/or immunotherapy agents taxol       To help prevent nausea and vomiting after your treatment, we encourage you to take your nausea medication as directed.  BELOW ARE SYMPTOMS THAT SHOULD BE REPORTED IMMEDIATELY: *FEVER GREATER THAN 100.4 F (38 C) OR HIGHER *CHILLS OR SWEATING *NAUSEA AND VOMITING THAT IS NOT CONTROLLED WITH YOUR NAUSEA MEDICATION *UNUSUAL SHORTNESS OF BREATH *UNUSUAL BRUISING OR BLEEDING *URINARY PROBLEMS (pain or burning when urinating, or frequent urination) *BOWEL PROBLEMS (unusual diarrhea, constipation, pain near the anus) TENDERNESS IN MOUTH AND THROAT WITH OR WITHOUT PRESENCE OF ULCERS (sore throat, sores in mouth, or a toothache) UNUSUAL RASH, SWELLING OR PAIN  UNUSUAL VAGINAL DISCHARGE OR ITCHING   Items with * indicate a potential emergency and should be followed up as soon as possible or go to the Emergency Department if any problems should occur.  Please show the CHEMOTHERAPY ALERT CARD or IMMUNOTHERAPY ALERT  CARD at check-in to the Emergency Department and triage nurse.  Should you have questions after your visit or need to cancel or reschedule your appointment, please contact [facility] - A DEPT OF [facility]  Dept: [phone]  and follow the prompts.  Office hours are 8:00 a.m. to 4:30 p.m. Monday - Friday. Please note that voicemails left after 4:00 p.m. may not be returned until the following business day.  We are closed weekends and major holidays. You have access to a nurse at all times for urgent questions. Please call the main number to the clinic Dept: [phone] and follow the prompts.   For any non-urgent questions, you may also contact your provider using MyChart. We now offer e-Visits for anyone 80 and older to request care online for non-urgent symptoms. For details visit [email or web address].   Also download the MyChart app! Go to the app store, search "MyChart", open the app, select [place], and log in with your MyChart username and password.

## 2023-12-05 ENCOUNTER — Ambulatory Visit: Attending: General Surgery | Admitting: Rehabilitation

## 2023-12-05 ENCOUNTER — Encounter: Payer: Self-pay | Admitting: Rehabilitation

## 2023-12-05 ENCOUNTER — Other Ambulatory Visit: Payer: Self-pay

## 2023-12-05 DIAGNOSIS — Z9189 Other specified personal risk factors, not elsewhere classified: Secondary | ICD-10-CM | POA: Insufficient documentation

## 2023-12-05 DIAGNOSIS — Z483 Aftercare following surgery for neoplasm: Secondary | ICD-10-CM | POA: Insufficient documentation

## 2023-12-05 DIAGNOSIS — C50412 Malignant neoplasm of upper-outer quadrant of left female breast: Secondary | ICD-10-CM | POA: Insufficient documentation

## 2023-12-05 DIAGNOSIS — Z9013 Acquired absence of bilateral breasts and nipples: Secondary | ICD-10-CM | POA: Diagnosis present

## 2023-12-05 DIAGNOSIS — Z1501 Genetic susceptibility to malignant neoplasm of breast: Secondary | ICD-10-CM | POA: Diagnosis present

## 2023-12-05 DIAGNOSIS — C50912 Malignant neoplasm of unspecified site of left female breast: Secondary | ICD-10-CM | POA: Insufficient documentation

## 2023-12-05 DIAGNOSIS — Z170 Estrogen receptor positive status [ER+]: Secondary | ICD-10-CM | POA: Insufficient documentation

## 2023-12-05 NOTE — Therapy (Signed)
 OUTPATIENT PHYSICAL THERAPY  UPPER EXTREMITY ONCOLOGY EVALUATION  Patient Name: [name] MRN: [id number] DOB:[birth year], 68 y.o., female Today's Date: 12/05/2023  END OF SESSION:  PT End of Session - 12/05/23 1052     Visit Number 1    Number of Visits 7    Date for PT Re-Evaluation 01/23/24    Authorization Type none    PT Start Time 1000    PT Stop Time 1050    PT Time Calculation (min) 50 min    Activity Tolerance Patient tolerated treatment well    Behavior During Therapy [facility] for tasks assessed/performed           Past Medical History:  Diagnosis Date   Anxiety    Anxiety disorder    BRCA2 positive 07/14/2012   Cancer (HCC) 2008   left breast. Right Breast Cancer in 2020   Dyslipidemia    Fall    Goiter    History of radiation therapy    Hypothyroidism    Osteoporosis    Past Surgical History:  Procedure Laterality Date   ABDOMINAL HYSTERECTOMY  2009   with BSO   BREAST LUMPECTOMY  7991,[id number], 1991   BREAST LUMPECTOMY WITH RADIOACTIVE SEED LOCALIZATION Right 10/02/2018   Procedure: RIGHT BREAST LUMPECTOMY WITH RADIOACTIVE SEED LOCALIZATION;  Surgeon: Curvin Deward MOULD, MD;  Location: [facility];  Service: General;  Laterality: Right;   COLONOSCOPY N/A 12/13/2015   Procedure: COLONOSCOPY;  Surgeon: Gladis MARLA Louder, MD;  Location: [facility];  Service: Endoscopy;  Laterality: N/A;   COLONOSCOPY  2023   FRACTURE SURGERY  2015   left hip   HARDWARE REMOVAL Left 07/08/2015   Procedure: LEFT HIP HARDWARE REMOVAL;  Surgeon: Dempsey Moan, MD;  Location: [facility];  Service: Orthopedics;  Laterality: Left;   MASTECTOMY W/ SENTINEL NODE BIOPSY Left 08/22/2023   Procedure: MASTECTOMY WITH SENTINEL LYMPH NODE BIOPSY;  Surgeon: Ebbie Cough, MD;  Location: [facility] OR;  Service: General;  Laterality: Left;  LMA w/ PEC BLOCK 150 MINUTES LEFT MASTECTOMY LEFT AXILLARY SENTINEL NODE BIOPSY MAGTRACE AND BLUE DYE INJECTION   PORTACATH PLACEMENT N/A  08/22/2023   Procedure: INSERTION, TUNNELED CENTRAL VENOUS DEVICE, WITH PORT;  Surgeon: Ebbie Cough, MD;  Location: [facility] OR;  Service: General;  Laterality: N/A;  PORT PLACEMENT WITH ULTRASOUND GUIDANCE   TOTAL MASTECTOMY Right 08/22/2023   Procedure: MASTECTOMY, SIMPLE;  Surgeon: Ebbie Cough, MD;  Location: [facility] OR;  Service: General;  Laterality: Right;  RIGHT RISK REDUCING MASTECTOMY   Patient Active Problem List   Diagnosis Date Noted   Port-A-Cath in place 09/23/2023   S/P bilateral mastectomy 08/22/2023   Malignant neoplasm of central portion of left breast in female, estrogen receptor negative (HCC) 07/17/2023   Anxiety 11/19/2022   Osteoporosis - [facility] 11/18/2022   Hypothyroidism 11/18/2022   Hyperlipidemia 11/18/2022   BRCA2 gene mutation positive in female 06/10/2022   Malignant neoplasm of central portion of right breast in female, estrogen receptor positive (HCC) 09/12/2018   Breast cancer, BRCA2 positive, left (HCC) 04/15/2017   Breast cancer screening, high risk patient 04/15/2017   Malignant neoplasm of upper-outer quadrant of left breast in female, estrogen receptor positive (HCC) 01/29/2013     REFERRING PROVIDER: Cough Ebbie, MD  REFERRING DIAG:  Diagnosis  [phone] (ICD-10-CM) - Postoperative state  C50.412,Z17.1 (ICD-10-CM) - Malignant neoplasm of upper-outer quadrant of left breast in female, estrogen receptor negative (HCC)  Z15.01,Z15.09 (ICD-10-CM) - BRCA2 positive    THERAPY DIAG:  Malignant  neoplasm of upper-outer quadrant of left breast in female, estrogen receptor positive [employer])  Aftercare following surgery for neoplasm  At risk for lymphedema  Breast cancer, BRCA2 positive, left (HCC)  S/P bilateral mastectomy  ONSET DATE: 06/21/23  Rationale for Evaluation and Treatment: Rehabilitation  SUBJECTIVE:                                                                                                                                                                                            SUBJECTIVE STATEMENT: A couple times when I have exercised it has gotten tight and swollen.  Denies CIPN.    PERTINENT HISTORY: Left breast Grade 2 IDC, triple negative +BRCA2.  Bil mastectomy 08/22/23 with 6 negative nodes. Will have chemo AC-P. Hx of 2 other episodes of breast cancer 2008 in left breast and 2020 in Rt breast. Lymph nodes only out on the left, 2 out previously for a total of 8.  Never anything out on the right.  Hx of radiation bilaterally.  In chemotherapy currently. Osteoporosis   PAIN:  Are you having pain? No not now , the chest just feels tight.    PRECAUTIONS: Left arm lymphedema risk   RED FLAGS: None   WEIGHT BEARING RESTRICTIONS: No  FALLS:  Has patient fallen in last 6 months? No  LIVING ENVIRONMENT: Lives with: lives with their family and lives with their spouse  OCCUPATION: retired   LEISURE: back to tennis, lifting weights, exercising    HAND DOMINANCE: right   PRIOR LEVEL OF FUNCTION: Independent  PATIENT GOALS: get back to normal activity    OBJECTIVE: Note: Objective measures were completed at Evaluation unless otherwise noted.  COGNITION: Overall cognitive status: Within functional limits for tasks assessed   PALPATION: Cording noted in Left axilla and upper arm   OBSERVATIONS / OTHER ASSESSMENTS: incisions well healed    SENSATION: Some numbness chest and axilla   POSTURE: WNL  UPPER EXTREMITY AROM/PROM:  A/PROM RIGHT   eval  12/05/23  Shoulder extension 65 60  Shoulder flexion 120 145  Shoulder abduction 135 153  Shoulder internal rotation 85 85  Shoulder external rotation 85 80    (Blank rows = not tested)  A/PROM LEFT   eval 12/05/23  Shoulder extension 65 60  Shoulder flexion 122 145  Shoulder abduction 122 153  Shoulder internal rotation 85 85  Shoulder external rotation 82 95    (Blank rows = not tested)   UPPER EXTREMITY STRENGTH:   LYMPHEDEMA  ASSESSMENTS:   LANDMARK RIGHT  eval 12/05/23  At axilla     15 cm proximal to olecranon process  27.4  10 cm proximal to olecranon process 26.9 26.5  Olecranon process 24.5 24.5  15 cm proximal to ulnar styloid process  23  10 cm proximal to ulnar styloid process 19.3 19.0  Just proximal to ulnar styloid process 15.8 15.8  Across hand at thumb web space 19.6 19.5  At base of 2nd digit 6.5 6.7  (Blank rows = not tested)  LANDMARK LEFT  eval 12/05/23  At axilla     15 cm proximal to olecranon process  27.8  10 cm proximal to olecranon process 27 27.5  Olecranon process 25 25.4  15 cm proximal to ulnar styloid process  23.4  10 cm proximal to ulnar styloid process 19.5 19.3  Just proximal to ulnar styloid process 16.1 16.3  Across hand at thumb web space 19.6 19.6  At base of 2nd digit 6.7 6.7  (Blank rows = not tested)  Rt: Lt: 63ml difference   L-DEX LYMPHEDEMA SCREENING: BASELINE SCORE (UNILATERAL): 7.3  QUICK DASH SURVEY: 54% limited                                                                                                                            TREATMENT DATE:  12/05/23 Redid SOZO and performed eval Measured for garment and gave pt link on [employer] for juzo soft size 2 20-94mmHg Then updated HEP which was a reminder of previous exercises which pt had not performed since discharge here.   The Supine Dowel flexion 5 x 10  Supine chest stretch 2x20  Abduction wall walking 15 x 2 Doorway stretch bil 2x20  Discussed POC  09/11/23 Eval performed Education on cording Edu and performance of HEP per below.  Completed SOZO and lymphedema education Discussed POC     PATIENT EDUCATION:  Education details: per today's note Person educated: Patient and Spouse Education method: Explanation, Demonstration, Tactile cues, Verbal cues, and Handouts Education comprehension: verbalized understanding and returned demonstration  HOME EXERCISE  PROGRAM: Access Code: [email or web address] URL: [email or web address] Date: 12/05/2023 Prepared by: Saddie Raw  Exercises - Supine Shoulder Flexion Extension AAROM with Dowel  - 1-2 x daily - 7 x weekly - 10 reps - 5 seconds hold - Supine Chest Stretch with Elbows Bent  - 1 x daily - 7 x weekly - 1 sets - 3 reps - 30-60seconds hold - Seated Scapular Retraction  - 1 x daily - 7 x weekly - 1-3 sets - 10 reps - 2-3 seconds hold - Standing Shoulder Abduction Finger Walk at Wall  - 1 x daily - 7 x weekly - 1-3 sets - 10 reps - 20-30 seconds hold - Doorway Pec Stretch at 90 Degrees Abduction  - 1 x daily - 7 x weekly - 1-3 sets - 10 reps - 20-30 seconds hold  ASSESSMENT:  CLINICAL IMPRESSION: Patient is a 68 y.o. female who returns to PT after being discharged 3 months ago due to episodes of swelling and continued pulling in the arm after  exercising a few times.  Her ROM is equal bilaterally with more tightness evident into the left chest wall and incision.  Her volume difference is 63ml and her SOZO is 7.3.  Due to no baseline we will use 10 for lymphedema cutoff.  Due to episodes of swelling we decided to order her a compression sleeve to use with activity and exercise.  She will self pay for her sleeve as she does not have a lymphedema diagnosis.  We will start PT visits to improve feelings of stiffness in the chest wall, pull in the arm, and to manage arm swelling throughout chemotherapy.    OBJECTIVE IMPAIRMENTS: decreased activity tolerance, decreased knowledge of condition, decreased knowledge of use of DME, decreased mobility, and decreased ROM.   ACTIVITY LIMITATIONS: carrying, lifting, and reach over head  PARTICIPATION LIMITATIONS: community activity and yard work  PERSONAL FACTORS: 1-2 comorbidities: previous hx of radiation and SLNB are also affecting patient's functional outcome.   REHAB POTENTIAL: Excellent  CLINICAL DECISION MAKING: Stable/uncomplicated  EVALUATION  COMPLEXITY: Low  GOALS: Goals reviewed with patient? Yes  SHORT TERM GOALS: Target date: 09/11/23   Pt will be educated on initial HEP to start for post-op ROM improvements Baseline: Goal status: MET   LONG TERM GOALS: Target date: 01/23/24  Pt will obtain appropriate compression garment for the UE to manage intermittent swelling.   Baseline:  Goal status: INITIAL  2.  Pt will be educated on lymphedema risk reduction and will have watched the ABC video Baseline:  Goal status: ONGOING  3.  Pt will be educated on exercises to continue during chemotherapy to minimize muscle loss Baseline:  Goal status: ONGOING   PLAN:  PT FREQUENCY: 1x / week   PT DURATION: 6 weeks   PLANNED INTERVENTIONS: 97110-Therapeutic exercises, 97530- Therapeutic activity, 97535- Self Care, [id number]- Manual therapy, Patient/Family education, Balance training, Joint mobilization, Therapeutic exercises, Therapeutic activity, Neuromuscular re-education, Gait training, and Self Care, orthotic fit training   PLAN FOR NEXT SESSION: bil ROM Lt>Rt, Lt cording work, MLD LT UE  Sumedh Shinsato, [place] R, PT 12/05/2023, 10:53 AM

## 2023-12-09 ENCOUNTER — Inpatient Hospital Stay (HOSPITAL_BASED_OUTPATIENT_CLINIC_OR_DEPARTMENT_OTHER): Admitting: Hematology

## 2023-12-09 ENCOUNTER — Inpatient Hospital Stay

## 2023-12-09 VITALS — BP 121/64 | HR 66 | Temp 97.9°F | Resp 20

## 2023-12-09 VITALS — BP 118/68 | HR 73 | Temp 97.8°F | Resp 99 | Ht 64.0 in | Wt 144.9 lb

## 2023-12-09 DIAGNOSIS — C50912 Malignant neoplasm of unspecified site of left female breast: Secondary | ICD-10-CM

## 2023-12-09 DIAGNOSIS — Z171 Estrogen receptor negative status [ER-]: Secondary | ICD-10-CM

## 2023-12-09 DIAGNOSIS — Z1501 Genetic susceptibility to malignant neoplasm of breast: Secondary | ICD-10-CM

## 2023-12-09 DIAGNOSIS — C50112 Malignant neoplasm of central portion of left female breast: Secondary | ICD-10-CM | POA: Diagnosis not present

## 2023-12-09 DIAGNOSIS — Z5111 Encounter for antineoplastic chemotherapy: Secondary | ICD-10-CM | POA: Diagnosis not present

## 2023-12-09 LAB — CMP (CANCER CENTER ONLY)
ALT: 14 U/L (ref 0–44)
AST: 10 U/L — ABNORMAL LOW (ref 15–41)
Albumin: 4.2 g/dL (ref 3.5–5.0)
Alkaline Phosphatase: 48 U/L (ref 38–126)
Anion gap: 5 (ref 5–15)
BUN: 16 mg/dL (ref 8–23)
CO2: 27 mmol/L (ref 22–32)
Calcium: 9.3 mg/dL (ref 8.9–10.3)
Chloride: 108 mmol/L (ref 98–111)
Creatinine: 0.7 mg/dL (ref 0.44–1.00)
GFR, Estimated: >60 mL/min (ref >60)
Glucose, Bld: 98 mg/dL (ref 70–99)
Potassium: 3.9 mmol/L (ref 3.5–5.1)
Sodium: 140 mmol/L (ref 135–145)
Total Bilirubin: 0.4 mg/dL (ref 0.0–1.2)
Total Protein: 6.4 g/dL — ABNORMAL LOW (ref 6.5–8.1)

## 2023-12-09 LAB — CBC WITH DIFFERENTIAL (CANCER CENTER ONLY)
Abs Immature Granulocytes: 0.01 K/uL (ref 0.00–0.07)
Basophils Absolute: 0.1 K/uL (ref 0.0–0.1)
Basophils Relative: 2 %
Eosinophils Absolute: 0.1 K/uL (ref 0.0–0.5)
Eosinophils Relative: 3 %
HCT: 28.2 % — ABNORMAL LOW (ref 36.0–46.0)
Hemoglobin: 9.5 g/dL — ABNORMAL LOW (ref 12.0–15.0)
Immature Granulocytes: 0 %
Lymphocytes Relative: 12 %
Lymphs Abs: 0.5 K/uL — ABNORMAL LOW (ref 0.7–4.0)
MCH: 30.5 pg (ref 26.0–34.0)
MCHC: 33.7 g/dL (ref 30.0–36.0)
MCV: 90.7 fL (ref 80.0–100.0)
Monocytes Absolute: 0.4 K/uL (ref 0.1–1.0)
Monocytes Relative: 8 %
Neutro Abs: 3.3 K/uL (ref 1.7–7.7)
Neutrophils Relative %: 75 %
Platelet Count: 303 K/uL (ref 150–400)
RBC: 3.11 MIL/uL — ABNORMAL LOW (ref 3.87–5.11)
RDW: 17.3 % — ABNORMAL HIGH (ref 11.5–15.5)
WBC Count: 4.4 K/uL (ref 4.0–10.5)
nRBC: 0 % (ref 0.0–0.2)

## 2023-12-09 MED ORDER — CETIRIZINE HCL 10 MG/ML IV SOLN
10.0000 mg | Freq: Once | INTRAVENOUS | Status: AC
  Administered 2023-12-09: 10 mg via INTRAVENOUS
  Filled 2023-12-09: qty 1

## 2023-12-09 MED ORDER — FAMOTIDINE IN NACL 20-0.9 MG/50ML-% IV SOLN
20.0000 mg | Freq: Once | INTRAVENOUS | Status: AC
  Administered 2023-12-09: 20 mg via INTRAVENOUS
  Filled 2023-12-09: qty 50

## 2023-12-09 MED ORDER — SODIUM CHLORIDE 0.9 % IV SOLN: INTRAVENOUS | Status: DC

## 2023-12-09 MED ORDER — SODIUM CHLORIDE 0.9% FLUSH
10.0000 mL | INTRAVENOUS | Status: DC | PRN
  Administered 2023-12-09: 10 mL

## 2023-12-09 MED ORDER — SODIUM CHLORIDE 0.9 % IV SOLN
80.0000 mg/m2 | Freq: Once | INTRAVENOUS | Status: AC
  Administered 2023-12-09: 138 mg via INTRAVENOUS
  Filled 2023-12-09: qty 23

## 2023-12-09 MED ORDER — DEXAMETHASONE SODIUM PHOSPHATE 10 MG/ML IJ SOLN
10.0000 mg | Freq: Once | INTRAMUSCULAR | Status: AC
  Administered 2023-12-09: 10 mg via INTRAVENOUS
  Filled 2023-12-09: qty 1

## 2023-12-09 MED ORDER — DIPHENHYDRAMINE HCL 50 MG/ML IJ SOLN
50.0000 mg | Freq: Once | INTRAMUSCULAR | Status: DC
  Filled 2023-12-09: qty 1

## 2023-12-09 NOTE — Assessment & Plan Note (Addendum)
-  pT2N0M0 stage IIA, ER-/PR-/HER2-, G3 -breast MRI 09/11/22 detected a non-mass enhancement in L breast but it was felt to be a blood vessel when biopsy was attempted.  A close follow-up breast MRI 12/14/22 showed persistent linear non mass enhancement in the lateral posterior left breast, bx was benign and concordant.  -breast MRI 06/21/2023 showed a 1.0 cm of non mass enhancement/possible enhancing mass with progressive Kinetics which is suspicious for malignancy, biopsy showed grade 2 invasive ductal carcinoma, triple negative.  The area measures 0.8 cm on ultrasound. - She underwent a bilateral mastectomy in the left lymph node dissection on Aug 22, 2023. -Surgical path showed 2.8 cm triple negative left breast adeno carcinoma, negative margins, all 6 lymph nodes were negative. -I recommend adjuvant chemotherapy AC-T, she started on 09/30/2023

## 2023-12-09 NOTE — Progress Notes (Signed)
 IV Benadryl  substituted to IV Quittyr d/t pt intolerance and request.  Burke Terry, PharmD, MBA

## 2023-12-09 NOTE — Patient Instructions (Signed)

## 2023-12-09 NOTE — Progress Notes (Signed)
 [facility]   [phone] [phone]   Clinic Follow up Note   Patient Care Team: Geofm Glade PARAS, MD as PCP - General (Internal Medicine) Tasia Lung, MD as Consulting Physician (Psychiatry) Melodi Lerner, MD as Consulting Physician (Orthopedic Surgery) Barbette Knock, MD as Consulting Physician (Obstetrics and Gynecology) Shannon Agent, MD as Consulting Physician (Radiation Oncology) Lanny Callander, MD as Consulting Physician (Hematology) [facility] as Consulting Physician (Radiology) Lita Lye, OD as Referring Physician (Optometry) Ebbie Cough, MD as Consulting Physician (General Surgery) Glean Stephane BROCKS, RN (Inactive) as Oncology Nurse Navigator Tyree Nanetta SAILOR, RN as Oncology Nurse Navigator  Date of Service:  12/09/2023  CHIEF COMPLAINT: f/u of breast  CURRENT THERAPY:  Adjuvant chemotherapy with weekly paclitaxel   Oncology History   Malignant neoplasm of central portion of left breast in female, estrogen receptor negative (HCC) -pT2N0M0 stage IIA, ER-/PR-/HER2-, G3 -breast MRI 09/11/22 detected a non-mass enhancement in L breast but it was felt to be a blood vessel when biopsy was attempted.  A close follow-up breast MRI 12/14/22 showed persistent linear non mass enhancement in the lateral posterior left breast, bx was benign and concordant.  -breast MRI 06/21/2023 showed a 1.0 cm of non mass enhancement/possible enhancing mass with progressive Kinetics which is suspicious for malignancy, biopsy showed grade 2 invasive ductal carcinoma, triple negative.  The area measures 0.8 cm on ultrasound. - She underwent a bilateral mastectomy in the left lymph node dissection on Aug 22, 2023. -Surgical path showed 2.8 cm triple negative left breast adeno carcinoma, negative margins, all 6 lymph nodes were negative. -I recommend adjuvant chemotherapy AC-T, she started on 09/30/2023  Assessment & Plan Malignant neoplasm  of left breast, post-mastectomy, on chemotherapy Undergoing weekly chemotherapy following a double mastectomy for malignant neoplasm of the left breast. Currently in week three of a twelve-week regimen. Reports increased fatigue but remains active by walking daily. No need for radiation therapy due to prior bilateral radiation. Motivated to continue chemotherapy despite fatigue. Discussed potential for increased fatigue towards the end of the regimen. Neuropathy risk discussed.  - Continue weekly chemotherapy regimen for a total of twelve weeks - Monitor for neuropathy and consider dose reduction or treatment break if neuropathy becomes severe - Provide prescription for COVID vaccination - Encourage flu vaccination at local pharmacy  Chemotherapy-induced anemia Hemoglobin level improved to 9.5 g/dL from 8.8 g/dL, indicating a positive response to current management. Anemia is secondary to chemotherapy.  Fatigue secondary to chemotherapy Reports fatigue, expected to increase as chemotherapy progresses. Managing fatigue by staying active and walking daily. - Encourage continued daily activity as tolerated  Plan - She is overall tolerating chemo well.  Lab reviewed, adequate with treatment, will proceed to chemo today and continue weekly - Follow-up in 2 weeks   SUMMARY OF ONCOLOGIC HISTORY: Oncology History  Breast cancer, BRCA2 positive, left (HCC)  04/15/2017 Initial Diagnosis   Breast cancer, BRCA2 positive, left (HCC)   09/30/2023 -  Chemotherapy   Patient is on Treatment Plan : BREAST DOSE DENSE AC q14d / PACLitaxel  q7d     Malignant neoplasm of central portion of right breast in female, estrogen receptor positive (HCC)  09/12/2018 Initial Diagnosis   Ductal carcinoma in situ (DCIS) of right breast   09/17/2018 Cancer Staging   Staging form: Breast, AJCC 8th Edition - Clinical: Stage 0 (cTis (DCIS), cN0, cM0, ER+, PR+, HER2: Not Assessed) - Signed by Crawford Morna Pickle, NP on  09/17/2018   10/02/2018  Cancer Staging   Staging form: Breast, AJCC 8th Edition - Pathologic stage from 10/02/2018: Stage IA (pT1b, pN0, cM0, G2, ER+, PR+, HER2-) - Signed by Crawford Morna Pickle, NP on 10/15/2018   Malignant neoplasm of central portion of left breast in female, estrogen receptor negative (HCC)  07/15/2023 Cancer Staging   Staging form: Breast, AJCC 8th Edition - Clinical stage from 07/15/2023: Stage IB (cT1b, cN0, cM0, G2, ER-, PR-, HER2-) - Signed by Lanny Callander, MD on 07/17/2023 Stage prefix: Initial diagnosis Histologic grading system: 3 grade system   07/17/2023 Initial Diagnosis   Malignant neoplasm of central portion of left breast in female, estrogen receptor negative (HCC)   08/22/2023 Cancer Staging   Staging form: Breast, AJCC 8th Edition - Pathologic stage from 08/22/2023: Stage IIA (pT2, pN0, cM0, G3, ER-, PR-, HER2-) - Signed by Lanny Callander, MD on 12/09/2023 Histologic grading system: 3 grade system Residual tumor (R): R0      Discussed the use of AI scribe software for clinical note transcription with the patient, who gave verbal consent to proceed.  History of Present Illness [name] is a 68 year old female with breast cancer who presents for follow-up.  She is in week 3 of a twelve-week chemotherapy regimen and experiences increased fatigue. There is no neuropathy, numbness, or tingling. Her hemoglobin level has improved to 9.5 from 8.8, and her weight remains stable at 144 pounds.  She has undergone a double mastectomy and prior radiation therapy.  She inquires about receiving a COVID vaccination and a flu shot.  She is able to care for herself and engages in physical activity, attempting to walk a few miles daily.     All other systems were reviewed with the patient and are negative.  MEDICAL HISTORY:  Past Medical History:  Diagnosis Date   Anxiety    Anxiety disorder    BRCA2 positive 07/14/2012   Cancer (HCC) 2008   left breast. Right  Breast Cancer in 2020   Dyslipidemia    Fall    Goiter    History of radiation therapy    Hypothyroidism    Osteoporosis     SURGICAL HISTORY: Past Surgical History:  Procedure Laterality Date   ABDOMINAL HYSTERECTOMY  2009   with BSO   BREAST LUMPECTOMY  [id number], 1991   BREAST LUMPECTOMY WITH RADIOACTIVE SEED LOCALIZATION Right 10/02/2018   Procedure: RIGHT BREAST LUMPECTOMY WITH RADIOACTIVE SEED LOCALIZATION;  Surgeon: Curvin Deward MOULD, MD;  Location: [facility];  Service: General;  Laterality: Right;   COLONOSCOPY N/A 12/13/2015   Procedure: COLONOSCOPY;  Surgeon: Gladis MARLA Louder, MD;  Location: [facility];  Service: Endoscopy;  Laterality: N/A;   COLONOSCOPY  2023   FRACTURE SURGERY  2015   left hip   HARDWARE REMOVAL Left 07/08/2015   Procedure: LEFT HIP HARDWARE REMOVAL;  Surgeon: Dempsey Moan, MD;  Location: [facility];  Service: Orthopedics;  Laterality: Left;   MASTECTOMY W/ SENTINEL NODE BIOPSY Left 08/22/2023   Procedure: MASTECTOMY WITH SENTINEL LYMPH NODE BIOPSY;  Surgeon: Ebbie Cough, MD;  Location: [facility];  Service: General;  Laterality: Left;  LMA w/ PEC BLOCK 150 MINUTES LEFT MASTECTOMY LEFT AXILLARY SENTINEL NODE BIOPSY MAGTRACE AND BLUE DYE INJECTION   PORTACATH PLACEMENT N/A 08/22/2023   Procedure: INSERTION, TUNNELED CENTRAL VENOUS DEVICE, WITH PORT;  Surgeon: Ebbie Cough, MD;  Location: [facility] OR;  Service: General;  Laterality: N/A;  PORT PLACEMENT WITH ULTRASOUND GUIDANCE   TOTAL MASTECTOMY Right 08/22/2023  Procedure: MASTECTOMY, SIMPLE;  Surgeon: Ebbie Cough, MD;  Location: [facility];  Service: General;  Laterality: Right;  RIGHT RISK REDUCING MASTECTOMY    I have reviewed the social history and family history with the patient and they are unchanged from previous note.  ALLERGIES:  is allergic to adhesive [tape], chloraprep one step [chlorhexidine  gluconate], latex, shellfish allergy, shellfish-derived products, cephalexin,  ampicillin, betadine  [povidone iodine ], and macrodantin [nitrofurantoin].  MEDICATIONS:  Current Outpatient Medications  Medication Sig Dispense Refill   atorvastatin  (LIPITOR) 10 MG tablet Take 0.5 tablets (5 mg total) by mouth daily. 90 tablet 1   clorazepate (TRANXENE) 15 MG tablet Take 7.5-15 mg by mouth See admin instructions. Take 15 mg at bedtime, may take a 7.5 mg dose in the morning as needed anxiety     dexamethasone  (DECADRON ) 4 MG tablet Take 2 tablets (8 mg total) by mouth daily for 3 days. Start the day after doxorubicin /cyclophosphamide  chemotherapy. Take with food. 30 tablet 1   docusate sodium  (COLACE) 250 MG capsule Take 250 mg by mouth daily.     fluconazole  (DIFLUCAN ) 150 MG tablet Take 1 tablet (150 mg total) by mouth daily. 1 tablet 0   levothyroxine  (SYNTHROID ) 125 MCG tablet Take 1 tablet (125 mcg total) by mouth daily. Take 5 days a  week. (Patient taking differently: Take 125 mcg by mouth every Monday, Tuesday, Wednesday, Thursday, and Friday. Take 5 days a  week.) 90 tablet 1   lidocaine -prilocaine  (EMLA ) cream Apply to affected area once 30 g 3   methocarbamol  (ROBAXIN ) 500 MG tablet Take 1 tablet (500 mg total) by mouth every 8 (eight) hours as needed (use for muscle cramps/pain). 30 tablet 0   Multiple Vitamins-Minerals (LUTEIN-ZEAXANTHIN) TABS Take 1 tablet by mouth daily.     ondansetron  (ZOFRAN ) 8 MG tablet Take 1 tab (8 mg) by mouth every 8 hrs as needed for nausea/vomiting. Start third day after doxorubicin /cyclophosphamide  chemotherapy. 30 tablet 1   prochlorperazine  (COMPAZINE ) 10 MG tablet TAKE 1 TABLET(10 MG) BY MOUTH EVERY 6 HOURS AS NEEDED FOR NAUSEA OR VOMITING 30 tablet 1   sulfamethoxazole -trimethoprim  (BACTRIM  DS) 800-160 MG tablet Take 1 tablet by mouth 2 (two) times daily. 10 tablet 0   traMADol (ULTRAM) 50 MG tablet Take 50 mg by mouth every 6 (six) hours as needed.     traZODone  (DESYREL ) 100 MG tablet Take 200 mg by mouth at bedtime.     VITAMIN D   PO Place 1 Dose under the tongue daily. drops     No current facility-administered medications for this visit.   Facility-Administered Medications Ordered in Other Visits  Medication Dose Route Frequency Provider Last Rate Last Admin   0.9 %  sodium chloride  infusion   Intravenous Continuous Lanny Callander, MD 10 mL/hr at 12/09/23 1049 New Bag at 12/09/23 1049   PACLitaxel  (TAXOL ) 138 mg in sodium chloride  0.9 % 250 mL chemo infusion (</= 80mg /m2)  80 mg/m2 (Treatment Plan Recorded) Intravenous Once Lanny Callander, MD 273 mL/hr at 12/09/23 1202 138 mg at 12/09/23 1202   sodium chloride  flush (NS) 0.9 % injection 10 mL  10 mL Intracatheter PRN Lanny Callander, MD        PHYSICAL EXAMINATION: ECOG PERFORMANCE STATUS: 1 - Symptomatic but completely ambulatory  Vitals:   12/09/23 0954  BP: 118/68  Pulse: 73  Resp: (!) 99  Temp: 97.8 F (36.6 C)   Wt Readings from Last 3 Encounters:  12/09/23 144 lb 14.4 oz (65.7 kg)  12/03/23 145 lb 3  oz (65.9 kg)  11/25/23 144 lb (65.3 kg)     GENERAL:alert, no distress and comfortable SKIN: skin color, texture, turgor are normal, no rashes or significant lesions EYES: normal, Conjunctiva are pink and non-injected, sclera clear Musculoskeletal:no cyanosis of digits and no clubbing  NEURO: alert & oriented x 3 with fluent speech, no focal motor/sensory deficits  Physical Exam   LABORATORY DATA:  I have reviewed the data as listed    Latest Ref Rng & Units 12/09/2023    9:31 AM 12/03/2023    8:06 AM 11/25/2023    8:43 AM  CBC  WBC 4.0 - 10.5 K/uL 4.4  6.2  25.0   Hemoglobin 12.0 - 15.0 g/dL 9.5  8.8  9.0   Hematocrit 36.0 - 46.0 % 28.2  26.7  27.7   Platelets 150 - 400 K/uL 303  315  208         Latest Ref Rng & Units 12/09/2023    9:31 AM 12/03/2023    8:06 AM 11/25/2023    8:43 AM  CMP  Glucose 70 - 99 mg/dL 98  836  842   BUN 8 - 23 mg/dL 16  13  11    Creatinine 0.44 - 1.00 mg/dL 9.29  9.31  9.21   Sodium 135 - 145 mmol/L 140  140  140   Potassium  3.5 - 5.1 mmol/L 3.9  3.6  3.6   Chloride 98 - 111 mmol/L 108  108  107   CO2 22 - 32 mmol/L 27  24  26    Calcium  8.9 - 10.3 mg/dL 9.3  8.7  9.0   Total Protein 6.5 - 8.1 g/dL 6.4  6.0  6.1   Total Bilirubin 0.0 - 1.2 mg/dL 0.4  0.3  0.3   Alkaline Phos 38 - 126 U/L 48  64  108   AST 15 - 41 U/L 10  10  12    ALT 0 - 44 U/L 14  12  9        RADIOGRAPHIC STUDIES: I have personally reviewed the radiological images as listed and agreed with the findings in the report. No results found.    Orders Placed This Encounter  Procedures   CBC with Differential (Cancer Center Only)    Standing Status:   Future    Expected Date:   01/06/2024    Expiration Date:   01/05/2025   CMP (Cancer Center only)    Standing Status:   Future    Expected Date:   01/06/2024    Expiration Date:   01/05/2025   CBC with Differential (Cancer Center Only)    Standing Status:   Future    Expected Date:   01/13/2024    Expiration Date:   01/12/2025   CMP (Cancer Center only)    Standing Status:   Future    Expected Date:   01/13/2024    Expiration Date:   01/12/2025   CBC with Differential (Cancer Center Only)    Standing Status:   Future    Expected Date:   01/20/2024    Expiration Date:   01/19/2025   CMP (Cancer Center only)    Standing Status:   Future    Expected Date:   01/20/2024    Expiration Date:   01/19/2025   CBC with Differential (Cancer Center Only)    Standing Status:   Future    Expected Date:   01/27/2024    Expiration Date:   01/26/2025   CMP (  Cancer Center only)    Standing Status:   Future    Expected Date:   01/27/2024    Expiration Date:   01/26/2025   CBC with Differential (Cancer Center Only)    Standing Status:   Future    Expected Date:   02/03/2024    Expiration Date:   02/02/2025   CMP (Cancer Center only)    Standing Status:   Future    Expected Date:   02/03/2024    Expiration Date:   02/02/2025   CBC with Differential (Cancer Center Only)    Standing Status:   Future     Expected Date:   02/10/2024    Expiration Date:   02/09/2025   CMP (Cancer Center only)    Standing Status:   Future    Expected Date:   02/10/2024    Expiration Date:   02/09/2025   All questions were answered. The patient knows to call the clinic with any problems, questions or concerns. No barriers to learning was detected. The total time spent in the appointment was 25 minutes, including review of chart and various tests results, discussions about plan of care and coordination of care plan     Onita Mattock, MD 12/09/2023

## 2023-12-10 ENCOUNTER — Encounter: Payer: Self-pay | Admitting: Hematology

## 2023-12-12 ENCOUNTER — Ambulatory Visit: Admitting: Rehabilitation

## 2023-12-12 ENCOUNTER — Encounter: Payer: Self-pay | Admitting: Rehabilitation

## 2023-12-12 DIAGNOSIS — Z9189 Other specified personal risk factors, not elsewhere classified: Secondary | ICD-10-CM

## 2023-12-12 DIAGNOSIS — Z170 Estrogen receptor positive status [ER+]: Secondary | ICD-10-CM

## 2023-12-12 DIAGNOSIS — C50912 Malignant neoplasm of unspecified site of left female breast: Secondary | ICD-10-CM

## 2023-12-12 DIAGNOSIS — C50412 Malignant neoplasm of upper-outer quadrant of left female breast: Secondary | ICD-10-CM | POA: Diagnosis not present

## 2023-12-12 DIAGNOSIS — Z9013 Acquired absence of bilateral breasts and nipples: Secondary | ICD-10-CM

## 2023-12-12 DIAGNOSIS — Z483 Aftercare following surgery for neoplasm: Secondary | ICD-10-CM

## 2023-12-12 NOTE — Therapy (Signed)
 OUTPATIENT PHYSICAL THERAPY  UPPER EXTREMITY ONCOLOGY   Patient Name: [name] MRN: [id number] DOB:March 13, 1956, 68 y.o., female Today's Date: 12/12/2023  END OF SESSION:  PT End of Session - 12/12/23 0907     Visit Number 2    Number of Visits 7    Date for PT Re-Evaluation 01/23/24    PT Start Time 0905    PT Stop Time 0949    PT Time Calculation (min) 44 min    Activity Tolerance Patient tolerated treatment well    Behavior During Therapy [facility] for tasks assessed/performed           Past Medical History:  Diagnosis Date   Anxiety    Anxiety disorder    BRCA2 positive 07/14/2012   Cancer (HCC) 2008   left breast. Right Breast Cancer in 2020   Dyslipidemia    Fall    Goiter    History of radiation therapy    Hypothyroidism    Osteoporosis    Past Surgical History:  Procedure Laterality Date   ABDOMINAL HYSTERECTOMY  2009   with BSO   BREAST LUMPECTOMY  [id number],[id number], 1991   BREAST LUMPECTOMY WITH RADIOACTIVE SEED LOCALIZATION Right 10/02/2018   Procedure: RIGHT BREAST LUMPECTOMY WITH RADIOACTIVE SEED LOCALIZATION;  Surgeon: Curvin Deward MOULD, MD;  Location: [facility];  Service: General;  Laterality: Right;   COLONOSCOPY N/A 12/13/2015   Procedure: COLONOSCOPY;  Surgeon: Gladis MARLA Louder, MD;  Location: [facility];  Service: Endoscopy;  Laterality: N/A;   COLONOSCOPY  2023   FRACTURE SURGERY  2015   left hip   HARDWARE REMOVAL Left 07/08/2015   Procedure: LEFT HIP HARDWARE REMOVAL;  Surgeon: Dempsey Moan, MD;  Location: [facility];  Service: Orthopedics;  Laterality: Left;   MASTECTOMY W/ SENTINEL NODE BIOPSY Left 08/22/2023   Procedure: MASTECTOMY WITH SENTINEL LYMPH NODE BIOPSY;  Surgeon: Ebbie Cough, MD;  [facility];  Service: General;  Laterality: Left;  LMA w/ PEC BLOCK 150 MINUTES LEFT MASTECTOMY LEFT AXILLARY SENTINEL NODE BIOPSY MAGTRACE AND BLUE DYE INJECTION   PORTACATH PLACEMENT N/A 08/22/2023   Procedure: INSERTION, TUNNELED  CENTRAL VENOUS DEVICE, WITH PORT;  Surgeon: Ebbie Cough, MD;  Location[facility];  Service: General;  Laterality: N/A;  PORT PLACEMENT WITH ULTRASOUND GUIDANCE   TOTAL MASTECTOMY Right 08/22/2023   Procedure: MASTECTOMY, SIMPLE;  Surgeon: Ebbie Cough, MD;  Location: [facility] OR;  Service: General;  Laterality: Right;  RIGHT RISK REDUCING MASTECTOMY   Patient Active Problem List   Diagnosis Date Noted   Port-A-Cath in place 09/23/2023   S/P bilateral mastectomy 08/22/2023   Malignant neoplasm of central portion of left breast in female, estrogen receptor negative (HCC) 07/17/2023   Anxiety 11/19/2022   Osteoporosis - [facility] 11/18/2022   Hypothyroidism 11/18/2022   Hyperlipidemia 11/18/2022   BRCA2 gene mutation positive in female 06/10/2022   Malignant neoplasm of central portion of right breast in female, estrogen receptor positive (HCC) 09/12/2018   Breast cancer, BRCA2 positive, left (HCC) 04/15/2017   Breast cancer screening, high risk patient 04/15/2017   Malignant neoplasm of upper-outer quadrant of left breast in female, estrogen receptor positive (HCC) 01/29/2013     REFERRING PROVIDER: Cough Ebbie, MD  REFERRING DIAG:  Diagnosis  [phone] (ICD-10-CM) - Postoperative state  C50.412,Z17.1 (ICD-10-CM) - Malignant neoplasm of upper-outer quadrant of left breast in female, estrogen receptor negative (HCC)  Z15.01,Z15.09 (ICD-10-CM) - BRCA2 positive    THERAPY DIAG:  Malignant neoplasm of upper-outer quadrant of left  breast in female, estrogen receptor positive [facility])  Aftercare following surgery for neoplasm  At risk for lymphedema  Breast cancer, BRCA2 positive, left (HCC)  S/P bilateral mastectomy  ONSET DATE: 06/21/23  Rationale for Evaluation and Treatment: Rehabilitation  SUBJECTIVE:                                                                                                                                                                                            SUBJECTIVE STATEMENT: I didn't sleep well last night.  I have been doing my stretches more and it does help.     PERTINENT HISTORY: Left breast Grade 2 IDC, triple negative +BRCA2.  Bil mastectomy 08/22/23 with 6 negative nodes. Will have chemo AC-P. Hx of 2 other episodes of breast cancer 2008 in left breast and 2020 in Rt breast. Lymph nodes only out on the left, 2 out previously for a total of 8.  Never anything out on the right.  Hx of radiation bilaterally.  In chemotherapy currently. Osteoporosis   PAIN:  Are you having pain? No not now , the chest just feels tight.    PRECAUTIONS: Left arm lymphedema risk   RED FLAGS: None   WEIGHT BEARING RESTRICTIONS: No  FALLS:  Has patient fallen in last 6 months? No  LIVING ENVIRONMENT: Lives with: lives with their family and lives with their spouse  OCCUPATION: retired   LEISURE: back to tennis, lifting weights, exercising    HAND DOMINANCE: right   PRIOR LEVEL OF FUNCTION: Independent  PATIENT GOALS: get back to normal activity    OBJECTIVE: Note: Objective measures were completed at Evaluation unless otherwise noted.  COGNITION: Overall cognitive status: Within functional limits for tasks assessed   PALPATION: Cording noted in Left axilla and upper arm   OBSERVATIONS / OTHER ASSESSMENTS: incisions well healed    SENSATION: Some numbness chest and axilla   POSTURE: WNL  UPPER EXTREMITY AROM/PROM:  A/PROM RIGHT   eval  12/05/23  Shoulder extension 65 60  Shoulder flexion 120 145  Shoulder abduction 135 153  Shoulder internal rotation 85 85  Shoulder external rotation 85 80    (Blank rows = not tested)  A/PROM LEFT   eval 12/05/23  Shoulder extension 65 60  Shoulder flexion 122 145  Shoulder abduction 122 153  Shoulder internal rotation 85 85  Shoulder external rotation 82 95    (Blank rows = not tested)   UPPER EXTREMITY STRENGTH:   LYMPHEDEMA ASSESSMENTS:   LANDMARK RIGHT  eval  12/05/23  At axilla     15 cm proximal to olecranon process  27.4  10 cm  proximal to olecranon process 26.9 26.5  Olecranon process 24.5 24.5  15 cm proximal to ulnar styloid process  23  10 cm proximal to ulnar styloid process 19.3 19.0  Just proximal to ulnar styloid process 15.8 15.8  Across hand at thumb web space 19.6 19.5  At base of 2nd digit 6.5 6.7  (Blank rows = not tested)  LANDMARK LEFT  eval 12/05/23  At axilla     15 cm proximal to olecranon process  27.8  10 cm proximal to olecranon process 27 27.5  Olecranon process 25 25.4  15 cm proximal to ulnar styloid process  23.4  10 cm proximal to ulnar styloid process 19.5 19.3  Just proximal to ulnar styloid process 16.1 16.3  Across hand at thumb web space 19.6 19.6  At base of 2nd digit 6.7 6.7  (Blank rows = not tested)  Rt: Lt: 63ml difference   L-DEX LYMPHEDEMA SCREENING: BASELINE SCORE (UNILATERAL): 7.3  QUICK DASH SURVEY: 54% limited                                                                                                                            TREATMENT DATE:  12/12/23 The Supine Dowel flexion 5 x 10  Supine chest stretch 2x60  Snow angel x 10 Doorway stretch bil 2x20  Chest stretch with LTR x 6 bil  Sidelying abduction AROM x 6 (easy)  Manual Therapy STM to the left pectoralis and latissimus in neutral and then with PROM and pinning.  PROM into flexion, abd, ER, and D2 diagonals  Demonstrated sleeve donning - pt thinks her sleeve may be a bit small so she will bring it on and try it a few more times.   12/05/23 Redid SOZO and performed eval Measured for garment and gave pt link on [employer] for juzo soft size 2 20-68mmHg Then updated HEP which was a reminder of previous exercises which pt had not performed since discharge here.   The Supine Dowel flexion 5 x 10  Supine chest stretch 2x20  Abduction wall walking 15 x 2 Doorway stretch bil 2x20  Discussed  POC  09/11/23 Eval performed Education on cording Edu and performance of HEP per below.  Completed SOZO and lymphedema education Discussed POC     PATIENT EDUCATION:  Education details: per today's note Person educated: Patient and Spouse Education method: Explanation, Demonstration, Tactile cues, Verbal cues, and Handouts Education comprehension: verbalized understanding and returned demonstration  HOME EXERCISE PROGRAM: Access Code: [email or web address] URL: [email or web address] Date: 12/05/2023 Prepared by: Saddie Raw  Exercises - Supine Shoulder Flexion Extension AAROM with Dowel  - 1-2 x daily - 7 x weekly - 10 reps - 5 seconds hold - Supine Chest Stretch with Elbows Bent  - 1 x daily - 7 x weekly - 1 sets - 3 reps - 30-60seconds hold - Seated Scapular Retraction  - 1 x daily - 7 x weekly - 1-3 sets - 10 reps -  2-3 seconds hold - Standing Shoulder Abduction Finger Walk at Wall  - 1 x daily - 7 x weekly - 1-3 sets - 10 reps - 20-30 seconds hold - Doorway Pec Stretch at 90 Degrees Abduction  - 1 x daily - 7 x weekly - 1-3 sets - 10 reps - 20-30 seconds hold  ASSESSMENT:  CLINICAL IMPRESSION: Pt is demonstrating improvements already with restarting her stretches.  She continues with tightness in the axilla and pectoralis.  She thinks her sleeve may be a bit small so we will check on this next visit.   OBJECTIVE IMPAIRMENTS: decreased activity tolerance, decreased knowledge of condition, decreased knowledge of use of DME, decreased mobility, and decreased ROM.   ACTIVITY LIMITATIONS: carrying, lifting, and reach over head  PARTICIPATION LIMITATIONS: community activity and yard work  PERSONAL FACTORS: 1-2 comorbidities: previous hx of radiation and SLNB are also affecting patient's functional outcome.   REHAB POTENTIAL: Excellent  CLINICAL DECISION MAKING: Stable/uncomplicated  EVALUATION COMPLEXITY: Low  GOALS: Goals reviewed with patient? Yes  SHORT TERM GOALS:  Target date: 09/11/23   Pt will be educated on initial HEP to start for post-op ROM improvements Baseline: Goal status: MET   LONG TERM GOALS: Target date: 01/23/24  Pt will obtain appropriate compression garment for the UE to manage intermittent swelling.   Baseline:  Goal status: INITIAL  2.  Pt will be educated on lymphedema risk reduction and will have watched the ABC video Baseline:  Goal status: ONGOING  3.  Pt will be educated on exercises to continue during chemotherapy to minimize muscle loss Baseline:  Goal status: ONGOING   PLAN:  PT FREQUENCY: 1x / week   PT DURATION: 6 weeks   PLANNED INTERVENTIONS: 97110-Therapeutic exercises, 97530- Therapeutic activity, 97535- Self Care, [id number]- Manual therapy, Patient/Family education, Balance training, Joint mobilization, Therapeutic exercises, Therapeutic activity, Neuromuscular re-education, Gait training, and Self Care, orthotic fit training   PLAN FOR NEXT SESSION: bil ROM Lt>Rt, Lt cording work, MLD LT UE  Magally Vahle, [place] R, PT 12/12/2023, 9:53 AM

## 2023-12-16 ENCOUNTER — Inpatient Hospital Stay: Admitting: Nurse Practitioner

## 2023-12-16 ENCOUNTER — Inpatient Hospital Stay

## 2023-12-16 ENCOUNTER — Encounter: Payer: Self-pay | Admitting: Hematology

## 2023-12-16 VITALS — BP 130/62 | HR 57 | Temp 97.8°F | Resp 20 | Wt 146.0 lb

## 2023-12-16 DIAGNOSIS — C50912 Malignant neoplasm of unspecified site of left female breast: Secondary | ICD-10-CM

## 2023-12-16 DIAGNOSIS — Z5111 Encounter for antineoplastic chemotherapy: Secondary | ICD-10-CM | POA: Diagnosis not present

## 2023-12-16 LAB — CMP (CANCER CENTER ONLY)
ALT: 19 U/L (ref 0–44)
AST: 15 U/L (ref 15–41)
Albumin: 4 g/dL (ref 3.5–5.0)
Alkaline Phosphatase: 50 U/L (ref 38–126)
Anion gap: 4 — ABNORMAL LOW (ref 5–15)
BUN: 13 mg/dL (ref 8–23)
CO2: 28 mmol/L (ref 22–32)
Calcium: 9.2 mg/dL (ref 8.9–10.3)
Chloride: 108 mmol/L (ref 98–111)
Creatinine: 0.76 mg/dL (ref 0.44–1.00)
GFR, Estimated: >60 mL/min (ref >60)
Glucose, Bld: 97 mg/dL (ref 70–99)
Potassium: 3.9 mmol/L (ref 3.5–5.1)
Sodium: 140 mmol/L (ref 135–145)
Total Bilirubin: 0.4 mg/dL (ref 0.0–1.2)
Total Protein: 6.3 g/dL — ABNORMAL LOW (ref 6.5–8.1)

## 2023-12-16 LAB — CBC WITH DIFFERENTIAL (CANCER CENTER ONLY)
Abs Immature Granulocytes: 0.03 K/uL (ref 0.00–0.07)
Basophils Absolute: 0.1 K/uL (ref 0.0–0.1)
Basophils Relative: 2 %
Eosinophils Absolute: 0.3 K/uL (ref 0.0–0.5)
Eosinophils Relative: 7 %
HCT: 28.2 % — ABNORMAL LOW (ref 36.0–46.0)
Hemoglobin: 9.2 g/dL — ABNORMAL LOW (ref 12.0–15.0)
Immature Granulocytes: 1 %
Lymphocytes Relative: 15 %
Lymphs Abs: 0.6 K/uL — ABNORMAL LOW (ref 0.7–4.0)
MCH: 30 pg (ref 26.0–34.0)
MCHC: 32.6 g/dL (ref 30.0–36.0)
MCV: 91.9 fL (ref 80.0–100.0)
Monocytes Absolute: 0.4 K/uL (ref 0.1–1.0)
Monocytes Relative: 9 %
Neutro Abs: 2.6 K/uL (ref 1.7–7.7)
Neutrophils Relative %: 66 %
Platelet Count: 258 K/uL (ref 150–400)
RBC: 3.07 MIL/uL — ABNORMAL LOW (ref 3.87–5.11)
RDW: 17.4 % — ABNORMAL HIGH (ref 11.5–15.5)
WBC Count: 4 K/uL (ref 4.0–10.5)
nRBC: 0 % (ref 0.0–0.2)

## 2023-12-16 MED ORDER — DEXAMETHASONE SODIUM PHOSPHATE 10 MG/ML IJ SOLN
10.0000 mg | Freq: Once | INTRAMUSCULAR | Status: AC
  Administered 2023-12-16: 10 mg via INTRAVENOUS
  Filled 2023-12-16: qty 1

## 2023-12-16 MED ORDER — CETIRIZINE HCL 10 MG/ML IV SOLN
10.0000 mg | Freq: Once | INTRAVENOUS | Status: AC
  Administered 2023-12-16: 10 mg via INTRAVENOUS
  Filled 2023-12-16: qty 1

## 2023-12-16 MED ORDER — SODIUM CHLORIDE 0.9 % IV SOLN
80.0000 mg/m2 | Freq: Once | INTRAVENOUS | Status: AC
  Administered 2023-12-16: 138 mg via INTRAVENOUS
  Filled 2023-12-16: qty 23

## 2023-12-16 MED ORDER — FAMOTIDINE IN NACL 20-0.9 MG/50ML-% IV SOLN
20.0000 mg | Freq: Once | INTRAVENOUS | Status: AC
  Administered 2023-12-16: 20 mg via INTRAVENOUS
  Filled 2023-12-16: qty 50

## 2023-12-16 MED ORDER — SODIUM CHLORIDE 0.9 % IV SOLN: INTRAVENOUS | Status: DC

## 2023-12-19 ENCOUNTER — Ambulatory Visit: Admitting: Rehabilitation

## 2023-12-19 ENCOUNTER — Encounter: Payer: Self-pay | Admitting: Rehabilitation

## 2023-12-19 DIAGNOSIS — Z170 Estrogen receptor positive status [ER+]: Secondary | ICD-10-CM

## 2023-12-19 DIAGNOSIS — Z483 Aftercare following surgery for neoplasm: Secondary | ICD-10-CM

## 2023-12-19 DIAGNOSIS — Z9189 Other specified personal risk factors, not elsewhere classified: Secondary | ICD-10-CM

## 2023-12-19 DIAGNOSIS — C50912 Malignant neoplasm of unspecified site of left female breast: Secondary | ICD-10-CM

## 2023-12-19 DIAGNOSIS — Z9013 Acquired absence of bilateral breasts and nipples: Secondary | ICD-10-CM

## 2023-12-19 DIAGNOSIS — C50412 Malignant neoplasm of upper-outer quadrant of left female breast: Secondary | ICD-10-CM | POA: Diagnosis not present

## 2023-12-19 NOTE — Therapy (Signed)
 OUTPATIENT PHYSICAL THERAPY  UPPER EXTREMITY ONCOLOGY   Patient Name: [name] MRN: [id number] DOB:18-Jan-1956, 68 y.o., female Today's Date: 12/19/2023  END OF SESSION:  PT End of Session - 12/19/23 1103     Visit Number 3    Number of Visits 7    Date for Recertification  01/23/24    PT Start Time 1106    PT Stop Time 1151    PT Time Calculation (min) 45 min    Activity Tolerance Patient tolerated treatment well    Behavior During Therapy [facility] for tasks assessed/performed           Past Medical History:  Diagnosis Date   Anxiety    Anxiety disorder    BRCA2 positive 07/14/2012   Cancer (HCC) 2008   left breast. Right Breast Cancer in 2020   Dyslipidemia    Fall    Goiter    History of radiation therapy    Hypothyroidism    Osteoporosis    Past Surgical History:  Procedure Laterality Date   ABDOMINAL HYSTERECTOMY  2009   with BSO   BREAST LUMPECTOMY  [id number],[id number], 1991   BREAST LUMPECTOMY WITH RADIOACTIVE SEED LOCALIZATION Right 10/02/2018   Procedure: RIGHT BREAST LUMPECTOMY WITH RADIOACTIVE SEED LOCALIZATION;  Surgeon: Curvin Deward MOULD, MD;  Location:  [facility];  Service: General;  Laterality: Right;   COLONOSCOPY N/A 12/13/2015   Procedure: COLONOSCOPY;  Surgeon: Gladis MARLA Louder, MD;  [facility];  Service: Endoscopy;  Laterality: N/A;   COLONOSCOPY  2023   FRACTURE SURGERY  2015   left hip   HARDWARE REMOVAL Left 07/08/2015   Procedure: LEFT HIP HARDWARE REMOVAL;  Surgeon: Dempsey Moan, MD;  [facility];  Service: Orthopedics;  Laterality: Left;   MASTECTOMY W/ SENTINEL NODE BIOPSY Left 08/22/2023   Procedure: MASTECTOMY WITH SENTINEL LYMPH NODE BIOPSY;  Surgeon: Ebbie Cough, MD;  [facility]: [facility];  Service: General;  Laterality: Left;  LMA w/ PEC BLOCK 150 MINUTES LEFT MASTECTOMY LEFT AXILLARY SENTINEL NODE BIOPSY MAGTRACE AND BLUE DYE INJECTION   PORTACATH PLACEMENT N/A 08/22/2023   Procedure: INSERTION, TUNNELED  CENTRAL VENOUS DEVICE, WITH PORT;  Surgeon: Ebbie Cough, MD;  Location: [facility] OR;  Service: General;  Laterality: N/A;  PORT PLACEMENT WITH ULTRASOUND GUIDANCE   TOTAL MASTECTOMY Right 08/22/2023   Procedure: MASTECTOMY, SIMPLE;  Surgeon: Ebbie Cough, MD;  Location: [facility] OR;  Service: General;  Laterality: Right;  RIGHT RISK REDUCING MASTECTOMY   Patient Active Problem List   Diagnosis Date Noted   Port-A-Cath in place 09/23/2023   S/P bilateral mastectomy 08/22/2023   Malignant neoplasm of central portion of left breast in female, estrogen receptor negative (HCC) 07/17/2023   Anxiety 11/19/2022   Osteoporosis - [facility] 11/18/2022   Hypothyroidism 11/18/2022   Hyperlipidemia 11/18/2022   BRCA2 gene mutation positive in female 06/10/2022   Malignant neoplasm of central portion of right breast in female, estrogen receptor positive (HCC) 09/12/2018   Breast cancer, BRCA2 positive, left (HCC) 04/15/2017   Breast cancer screening, high risk patient 04/15/2017   Malignant neoplasm of upper-outer quadrant of left breast in female, estrogen receptor positive (HCC) 01/29/2013     REFERRING PROVIDER: Cough Ebbie, MD  REFERRING DIAG:  Diagnosis  [phone] (ICD-10-CM) - Postoperative state  C50.412,Z17.1 (ICD-10-CM) - Malignant neoplasm of upper-outer quadrant of left breast in female, estrogen receptor negative (HCC)  Z15.01,Z15.09 (ICD-10-CM) - BRCA2 positive    THERAPY DIAG:  Malignant neoplasm of upper-outer quadrant of left  breast in female, estrogen receptor positive [facility])  Aftercare following surgery for neoplasm  At risk for lymphedema  Breast cancer, BRCA2 positive, left (HCC)  S/P bilateral mastectomy  ONSET DATE: 06/21/23  Rationale for Evaluation and Treatment: Rehabilitation  SUBJECTIVE:                                                                                                                                                                                            SUBJECTIVE STATEMENT: I am little tired today.      PERTINENT HISTORY: Left breast Grade 2 IDC, triple negative +BRCA2.  Bil mastectomy 08/22/23 with 6 negative nodes. Will have chemo AC-P. Hx of 2 other episodes of breast cancer 2008 in left breast and 2020 in Rt breast. Lymph nodes only out on the left, 2 out previously for a total of 8.  Never anything out on the right.  Hx of radiation bilaterally.  In chemotherapy currently. Osteoporosis   PAIN:  Are you having pain? No not now , the chest just feels tight.    PRECAUTIONS: Left arm lymphedema risk   RED FLAGS: None   WEIGHT BEARING RESTRICTIONS: No  FALLS:  Has patient fallen in last 6 months? No  LIVING ENVIRONMENT: Lives with: lives with their family and lives with their spouse  OCCUPATION: retired   LEISURE: back to tennis, lifting weights, exercising    HAND DOMINANCE: right   PRIOR LEVEL OF FUNCTION: Independent  PATIENT GOALS: get back to normal activity    OBJECTIVE: Note: Objective measures were completed at Evaluation unless otherwise noted.  COGNITION: Overall cognitive status: Within functional limits for tasks assessed   PALPATION: Cording noted in Left axilla and upper arm   OBSERVATIONS / OTHER ASSESSMENTS: incisions well healed    SENSATION: Some numbness chest and axilla   POSTURE: WNL  UPPER EXTREMITY AROM/PROM:  A/PROM RIGHT   eval  12/05/23  Shoulder extension 65 60  Shoulder flexion 120 145  Shoulder abduction 135 153  Shoulder internal rotation 85 85  Shoulder external rotation 85 80    (Blank rows = not tested)  A/PROM LEFT   eval 12/05/23  Shoulder extension 65 60  Shoulder flexion 122 145  Shoulder abduction 122 153  Shoulder internal rotation 85 85  Shoulder external rotation 82 95    (Blank rows = not tested)   UPPER EXTREMITY STRENGTH:   LYMPHEDEMA ASSESSMENTS:   LANDMARK RIGHT  eval 12/05/23  At axilla     15 cm proximal to olecranon process   27.4  10 cm proximal to olecranon process 26.9 26.5  Olecranon process 24.5 24.5  15 cm proximal to ulnar styloid process  23  10 cm proximal to ulnar styloid process 19.3 19.0  Just proximal to ulnar styloid process 15.8 15.8  Across hand at thumb web space 19.6 19.5  At base of 2nd digit 6.5 6.7  (Blank rows = not tested)  LANDMARK LEFT  eval 12/05/23  At axilla     15 cm proximal to olecranon process  27.8  10 cm proximal to olecranon process 27 27.5  Olecranon process 25 25.4  15 cm proximal to ulnar styloid process  23.4  10 cm proximal to ulnar styloid process 19.5 19.3  Just proximal to ulnar styloid process 16.1 16.3  Across hand at thumb web space 19.6 19.6  At base of 2nd digit 6.7 6.7  (Blank rows = not tested)  Rt: Lt: 63ml difference   L-DEX LYMPHEDEMA SCREENING: BASELINE SCORE (UNILATERAL): 7.3  QUICK DASH SURVEY: 54% limited                                                                                                                            TREATMENT DATE:  12/19/23 Tried on sleeve and this does fit well.   Pulleys into flexion and abduction x each  Wall ball flexion 5 x 5  The Supine Dowel flexion 5 x 10  Supine chest stretch 2x60  Snow angel x 10 Doorway stretch bil 2x20  Chest stretch with LTR x 6 bil  Manual Therapy STM to the left pectoralis and latissimus in neutral and then with PROM and pinning.  PROM into flexion, abd, ER, and D2 diagonals   12/12/23 The Supine Dowel flexion 5 x 10  Supine chest stretch 2x60  Snow angel x 10 Doorway stretch bil 2x20  Chest stretch with LTR x 6 bil  Sidelying abduction AROM x 6 (easy)  Manual Therapy STM to the left pectoralis and latissimus in neutral and then with PROM and pinning.  PROM into flexion, abd, ER, and D2 diagonals  Demonstrated sleeve donning - pt thinks her sleeve may be a bit small so she will bring it on and try it a few more times.   12/05/23 Redid SOZO and  performed eval Measured for garment and gave pt link on [employer] for juzo soft size 2 20-12mmHg Then updated HEP which was a reminder of previous exercises which pt had not performed since discharge here.   The Supine Dowel flexion 5 x 10  Supine chest stretch 2x20  Abduction wall walking 15 x 2 Doorway stretch bil 2x20  Discussed POC  09/11/23 Eval performed Education on cording Edu and performance of HEP per below.  Completed SOZO and lymphedema education Discussed POC     PATIENT EDUCATION:  Education details: per today's note Person educated: Patient and Spouse Education method: Explanation, Demonstration, Tactile cues, Verbal cues, and Handouts Education comprehension: verbalized understanding and returned demonstration  HOME EXERCISE PROGRAM: Access Code: [email or web address] URL: [email or web address] Date: 12/05/2023 Prepared  by: Saddie Raw  Exercises - Supine Shoulder Flexion Extension AAROM with Dowel  - 1-2 x daily - 7 x weekly - 10 reps - 5 seconds hold - Supine Chest Stretch with Elbows Bent  - 1 x daily - 7 x weekly - 1 sets - 3 reps - 30-60seconds hold - Seated Scapular Retraction  - 1 x daily - 7 x weekly - 1-3 sets - 10 reps - 2-3 seconds hold - Standing Shoulder Abduction Finger Walk at Wall  - 1 x daily - 7 x weekly - 1-3 sets - 10 reps - 20-30 seconds hold - Doorway Pec Stretch at 90 Degrees Abduction  - 1 x daily - 7 x weekly - 1-3 sets - 10 reps - 20-30 seconds hold  ASSESSMENT:  CLINICAL IMPRESSION: Pt is demonstrating improvements in her ROM and the tightness in the axilla and pectoralis are also visibly less today. Her sleeve fits well she was just putting it on incorrectly.    OBJECTIVE IMPAIRMENTS: decreased activity tolerance, decreased knowledge of condition, decreased knowledge of use of DME, decreased mobility, and decreased ROM.   ACTIVITY LIMITATIONS: carrying, lifting, and reach over head  PARTICIPATION LIMITATIONS: community  activity and yard work  PERSONAL FACTORS: 1-2 comorbidities: previous hx of radiation and SLNB are also affecting patient's functional outcome.   REHAB POTENTIAL: Excellent  CLINICAL DECISION MAKING: Stable/uncomplicated  EVALUATION COMPLEXITY: Low  GOALS: Goals reviewed with patient? Yes  SHORT TERM GOALS: Target date: 09/11/23   Pt will be educated on initial HEP to start for post-op ROM improvements Baseline: Goal status: MET   LONG TERM GOALS: Target date: 01/23/24  Pt will obtain appropriate compression garment for the UE to manage intermittent swelling.   Baseline:  Goal status: INITIAL  2.  Pt will be educated on lymphedema risk reduction and will have watched the ABC video Baseline:  Goal status: ONGOING  3.  Pt will be educated on exercises to continue during chemotherapy to minimize muscle loss Baseline:  Goal status: ONGOING   PLAN:  PT FREQUENCY: 1x / week   PT DURATION: 6 weeks   PLANNED INTERVENTIONS: 97110-Therapeutic exercises, 97530- Therapeutic activity, 97535- Self Care, [id number]- Manual therapy, Patient/Family education, Balance training, Joint mobilization, Therapeutic exercises, Therapeutic activity, Neuromuscular re-education, Gait training, and Self Care, orthotic fit training   PLAN FOR NEXT SESSION: bil ROM Lt>Rt, Lt cording work, MLD LT UE  Jamaris Biernat, [place] R, PT 12/19/2023, 11:51 AM

## 2023-12-23 ENCOUNTER — Inpatient Hospital Stay

## 2023-12-23 ENCOUNTER — Inpatient Hospital Stay (HOSPITAL_BASED_OUTPATIENT_CLINIC_OR_DEPARTMENT_OTHER): Admitting: Hematology

## 2023-12-23 VITALS — BP 124/64 | HR 69 | Temp 97.5°F | Resp 16 | Ht 64.0 in | Wt 147.5 lb

## 2023-12-23 VITALS — BP 119/69 | HR 65 | Temp 97.8°F | Resp 18

## 2023-12-23 DIAGNOSIS — Z5111 Encounter for antineoplastic chemotherapy: Secondary | ICD-10-CM | POA: Diagnosis not present

## 2023-12-23 DIAGNOSIS — Z171 Estrogen receptor negative status [ER-]: Secondary | ICD-10-CM

## 2023-12-23 DIAGNOSIS — C50912 Malignant neoplasm of unspecified site of left female breast: Secondary | ICD-10-CM

## 2023-12-23 DIAGNOSIS — C50112 Malignant neoplasm of central portion of left female breast: Secondary | ICD-10-CM | POA: Diagnosis not present

## 2023-12-23 LAB — CMP (CANCER CENTER ONLY)
ALT: 21 U/L (ref 0–44)
AST: 16 U/L (ref 15–41)
Albumin: 4 g/dL (ref 3.5–5.0)
Alkaline Phosphatase: 55 U/L (ref 38–126)
Anion gap: 3 — ABNORMAL LOW (ref 5–15)
BUN: 16 mg/dL (ref 8–23)
CO2: 28 mmol/L (ref 22–32)
Calcium: 9.3 mg/dL (ref 8.9–10.3)
Chloride: 107 mmol/L (ref 98–111)
Creatinine: 0.77 mg/dL (ref 0.44–1.00)
GFR, Estimated: >60 mL/min (ref >60)
Glucose, Bld: 97 mg/dL (ref 70–99)
Potassium: 3.9 mmol/L (ref 3.5–5.1)
Sodium: 138 mmol/L (ref 135–145)
Total Bilirubin: 0.4 mg/dL (ref 0.0–1.2)
Total Protein: 6.4 g/dL — ABNORMAL LOW (ref 6.5–8.1)

## 2023-12-23 LAB — CBC WITH DIFFERENTIAL (CANCER CENTER ONLY)
Abs Immature Granulocytes: 0.03 K/uL (ref 0.00–0.07)
Basophils Absolute: 0.1 K/uL (ref 0.0–0.1)
Basophils Relative: 2 %
Eosinophils Absolute: 0.2 K/uL (ref 0.0–0.5)
Eosinophils Relative: 6 %
HCT: 29.3 % — ABNORMAL LOW (ref 36.0–46.0)
Hemoglobin: 9.7 g/dL — ABNORMAL LOW (ref 12.0–15.0)
Immature Granulocytes: 1 %
Lymphocytes Relative: 20 %
Lymphs Abs: 0.7 K/uL (ref 0.7–4.0)
MCH: 30.2 pg (ref 26.0–34.0)
MCHC: 33.1 g/dL (ref 30.0–36.0)
MCV: 91.3 fL (ref 80.0–100.0)
Monocytes Absolute: 0.3 K/uL (ref 0.1–1.0)
Monocytes Relative: 9 %
Neutro Abs: 2 K/uL (ref 1.7–7.7)
Neutrophils Relative %: 62 %
Platelet Count: 251 K/uL (ref 150–400)
RBC: 3.21 MIL/uL — ABNORMAL LOW (ref 3.87–5.11)
RDW: 15.7 % — ABNORMAL HIGH (ref 11.5–15.5)
WBC Count: 3.3 K/uL — ABNORMAL LOW (ref 4.0–10.5)
nRBC: 0 % (ref 0.0–0.2)

## 2023-12-23 MED ORDER — SODIUM CHLORIDE 0.9 % IV SOLN: INTRAVENOUS | Status: DC

## 2023-12-23 MED ORDER — FAMOTIDINE IN NACL 20-0.9 MG/50ML-% IV SOLN
20.0000 mg | Freq: Once | INTRAVENOUS | Status: AC
  Administered 2023-12-23: 20 mg via INTRAVENOUS
  Filled 2023-12-23: qty 50

## 2023-12-23 MED ORDER — CETIRIZINE HCL 10 MG/ML IV SOLN
10.0000 mg | Freq: Once | INTRAVENOUS | Status: AC
  Administered 2023-12-23: 10 mg via INTRAVENOUS
  Filled 2023-12-23: qty 1

## 2023-12-23 MED ORDER — SODIUM CHLORIDE 0.9 % IV SOLN
80.0000 mg/m2 | Freq: Once | INTRAVENOUS | Status: AC
  Administered 2023-12-23: 138 mg via INTRAVENOUS
  Filled 2023-12-23: qty 23

## 2023-12-23 MED ORDER — DEXAMETHASONE SODIUM PHOSPHATE 10 MG/ML IJ SOLN
10.0000 mg | Freq: Once | INTRAMUSCULAR | Status: AC
  Administered 2023-12-23: 10 mg via INTRAVENOUS
  Filled 2023-12-23: qty 1

## 2023-12-23 NOTE — Patient Instructions (Signed)

## 2023-12-23 NOTE — Assessment & Plan Note (Signed)
-  pT2N0M0 stage IIA, ER-/PR-/HER2-, G3 -breast MRI 09/11/22 detected a non-mass enhancement in L breast but it was felt to be a blood vessel when biopsy was attempted.  A close follow-up breast MRI 12/14/22 showed persistent linear non mass enhancement in the lateral posterior left breast, bx was benign and concordant.  -breast MRI 06/21/2023 showed a 1.0 cm of non mass enhancement/possible enhancing mass with progressive Kinetics which is suspicious for malignancy, biopsy showed grade 2 invasive ductal carcinoma, triple negative.  The area measures 0.8 cm on ultrasound. - She underwent a bilateral mastectomy in the left lymph node dissection on Aug 22, 2023. -Surgical path showed 2.8 cm triple negative left breast adeno carcinoma, negative margins, all 6 lymph nodes were negative. -I recommend adjuvant chemotherapy AC-T, she started on 09/30/2023

## 2023-12-23 NOTE — Progress Notes (Signed)
 [facility]   [phone] [phone]   Clinic Follow up Note   Patient Care Team: Geofm Glade PARAS, MD as PCP - General (Internal Medicine) Tasia Lung, MD as Consulting Physician (Psychiatry) Melodi Lerner, MD as Consulting Physician (Orthopedic Surgery) Barbette Knock, MD as Consulting Physician (Obstetrics and Gynecology) Shannon Agent, MD as Consulting Physician (Radiation Oncology) Lanny Callander, MD as Consulting Physician (Hematology) [facility] as Consulting Physician (Radiology) Lita Lye, OD as Referring Physician (Optometry) Ebbie Cough, MD as Consulting Physician (General Surgery) Tyree Nanetta SAILOR, RN as Oncology Nurse Navigator  Date of Service:  12/23/2023  CHIEF COMPLAINT: f/u of breast cancer  CURRENT THERAPY:  Adjuvant weekly paclitaxel   Oncology History   Malignant neoplasm of central portion of left breast in female, estrogen receptor negative (HCC) -pT2N0M0 stage IIA, ER-/PR-/HER2-, G3 -breast MRI 09/11/22 detected a non-mass enhancement in L breast but it was felt to be a blood vessel when biopsy was attempted.  A close follow-up breast MRI 12/14/22 showed persistent linear non mass enhancement in the lateral posterior left breast, bx was benign and concordant.  -breast MRI 06/21/2023 showed a 1.0 cm of non mass enhancement/possible enhancing mass with progressive Kinetics which is suspicious for malignancy, biopsy showed grade 2 invasive ductal carcinoma, triple negative.  The area measures 0.8 cm on ultrasound. - She underwent a bilateral mastectomy in the left lymph node dissection on Aug 22, 2023. -Surgical path showed 2.8 cm triple negative left breast adeno carcinoma, negative margins, all 6 lymph nodes were negative. -I recommend adjuvant chemotherapy AC-T, she started on 09/30/2023  Assessment & Plan Malignant neoplasm of central portion of left breast Currently undergoing adjuvant  chemotherapy with Taxol , in week five of treatment. Reports mild fatigue, no significant adverse effects such as numbness, tingling, rash, or joint pain. No allergic reactions during infusions. Fatigue expected to worsen over the three-month treatment but should improve within three to four weeks post-treatment. Using ice to prevent neuropathy. Received flu shot. - Continue adjuvant chemotherapy with Taxol  as scheduled. - Schedule COVID vaccination in a couple of weeks. - Advise to schedule chemotherapy appointments for November.  Anemia secondary to chemotherapy Anemia present but improving with current hemoglobin level of 9.7, an increase from previous levels.  Leukopenia secondary to chemotherapy White blood cell count slightly low but not contraindicating continuation of chemotherapy.  Plan - Labs reviewed, adequate for treatment, will proceed paclitaxel  today and continue weekly - Follow-up in 2 weeks   SUMMARY OF ONCOLOGIC HISTORY: Oncology History  Breast cancer, BRCA2 positive, left (HCC)  04/15/2017 Initial Diagnosis   Breast cancer, BRCA2 positive, left (HCC)   09/30/2023 -  Chemotherapy   Patient is on Treatment Plan : BREAST DOSE DENSE AC q14d / PACLitaxel  q7d     Malignant neoplasm of central portion of right breast in female, estrogen receptor positive (HCC)  09/12/2018 Initial Diagnosis   Ductal carcinoma in situ (DCIS) of right breast   09/17/2018 Cancer Staging   Staging form: Breast, AJCC 8th Edition - Clinical: Stage 0 (cTis (DCIS), cN0, cM0, ER+, PR+, HER2: Not Assessed) - Signed by Crawford Morna Pickle, NP on 09/17/2018   10/02/2018 Cancer Staging   Staging form: Breast, AJCC 8th Edition - Pathologic stage from 10/02/2018: Stage IA (pT1b, pN0, cM0, G2, ER+, PR+, HER2-) - Signed by Crawford Morna Pickle, NP on 10/15/2018   Malignant neoplasm of central portion of left breast in female, estrogen receptor negative (HCC)  07/15/2023 Cancer Staging  Staging form:  Breast, AJCC 8th Edition - Clinical stage from 07/15/2023: Stage IB (cT1b, cN0, cM0, G2, ER-, PR-, HER2-) - Signed by Lanny Callander, MD on 07/17/2023 Stage prefix: Initial diagnosis Histologic grading system: 3 grade system   07/17/2023 Initial Diagnosis   Malignant neoplasm of central portion of left breast in female, estrogen receptor negative (HCC)   08/22/2023 Cancer Staging   Staging form: Breast, AJCC 8th Edition - Pathologic stage from 08/22/2023: Stage IIA (pT2, pN0, cM0, G3, ER-, PR-, HER2-) - Signed by Lanny Callander, MD on 12/09/2023 Histologic grading system: 3 grade system Residual tumor (R): R0      Discussed the use of AI scribe software for clinical note transcription with the patient, who gave verbal consent to proceed.  History of Present Illness [name] is a 68 year old female with breast cancer who presents for follow-up of adjuvant chemotherapy.  She is undergoing adjuvant chemotherapy with Taxol  and has completed four weeks of treatment. She experiences mild fatigue but no numbness or tingling in her extremities. She uses ice to mitigate potential side effects. No allergic reactions or rashes have occurred during chemotherapy infusions, and she denies joint pain. Her hemoglobin level is 9.7, and her white blood cell count is slightly low.     All other systems were reviewed with the patient and are negative.  MEDICAL HISTORY:  Past Medical History:  Diagnosis Date   Anxiety    Anxiety disorder    BRCA2 positive 07/14/2012   Cancer (HCC) 2008   left breast. Right Breast Cancer in 2020   Dyslipidemia    Fall    Goiter    History of radiation therapy    Hypothyroidism    Osteoporosis     SURGICAL HISTORY: Past Surgical History:  Procedure Laterality Date   ABDOMINAL HYSTERECTOMY  2009   with BSO   BREAST LUMPECTOMY  [id number], 1991   BREAST LUMPECTOMY WITH RADIOACTIVE SEED LOCALIZATION Right 10/02/2018   Procedure: RIGHT BREAST LUMPECTOMY WITH RADIOACTIVE  SEED LOCALIZATION;  Surgeon: Curvin Deward MOULD, MD;  Location: [facility];  Service: General;  Laterality: Right;   COLONOSCOPY N/A 12/13/2015   Procedure: COLONOSCOPY;  Surgeon: Gladis MARLA Louder, MD;  Location: [facility];  Service: Endoscopy;  Laterality: N/A;   COLONOSCOPY  2023   FRACTURE SURGERY  2015   left hip   HARDWARE REMOVAL Left 07/08/2015   Procedure: LEFT HIP HARDWARE REMOVAL;  Surgeon: Dempsey Moan, MD;  Location: [facility];  Service: Orthopedics;  Laterality: Left;   MASTECTOMY W/ SENTINEL NODE BIOPSY Left 08/22/2023   Procedure: MASTECTOMY WITH SENTINEL LYMPH NODE BIOPSY;  Surgeon: Ebbie Cough, MD;  Location: [facility];  Service: General;  Laterality: Left;  LMA w/ PEC BLOCK 150 MINUTES LEFT MASTECTOMY LEFT AXILLARY SENTINEL NODE BIOPSY MAGTRACE AND BLUE DYE INJECTION   PORTACATH PLACEMENT N/A 08/22/2023   Procedure: INSERTION, TUNNELED CENTRAL VENOUS DEVICE, WITH PORT;  Surgeon: Ebbie Cough, MD;  Location: [facility];  Service: General;  Laterality: N/A;  PORT PLACEMENT WITH ULTRASOUND GUIDANCE   TOTAL MASTECTOMY Right 08/22/2023   Procedure: MASTECTOMY, SIMPLE;  Surgeon: Ebbie Cough, MD;  Location: [facility] OR;  Service: General;  Laterality: Right;  RIGHT RISK REDUCING MASTECTOMY    I have reviewed the social history and family history with the patient and they are unchanged from previous note.  ALLERGIES:  is allergic to adhesive [tape], chloraprep one step [chlorhexidine  gluconate], latex, shellfish allergy, shellfish-derived products, cephalexin, ampicillin, betadine  [povidone iodine ], and  macrodantin [nitrofurantoin].  MEDICATIONS:  Current Outpatient Medications  Medication Sig Dispense Refill   atorvastatin  (LIPITOR) 10 MG tablet Take 0.5 tablets (5 mg total) by mouth daily. 90 tablet 1   clorazepate (TRANXENE) 15 MG tablet Take 7.5-15 mg by mouth See admin instructions. Take 15 mg at bedtime, may take a 7.5 mg dose in the morning as needed anxiety      dexamethasone  (DECADRON ) 4 MG tablet Take 2 tablets (8 mg total) by mouth daily for 3 days. Start the day after doxorubicin /cyclophosphamide  chemotherapy. Take with food. 30 tablet 1   docusate sodium  (COLACE) 250 MG capsule Take 250 mg by mouth daily.     fluconazole  (DIFLUCAN ) 150 MG tablet Take 1 tablet (150 mg total) by mouth daily. 1 tablet 0   levothyroxine  (SYNTHROID ) 125 MCG tablet Take 1 tablet (125 mcg total) by mouth daily. Take 5 days a  week. (Patient taking differently: Take 125 mcg by mouth every Monday, Tuesday, Wednesday, Thursday, and Friday. Take 5 days a  week.) 90 tablet 1   lidocaine -prilocaine  (EMLA ) cream Apply to affected area once 30 g 3   methocarbamol  (ROBAXIN ) 500 MG tablet Take 1 tablet (500 mg total) by mouth every 8 (eight) hours as needed (use for muscle cramps/pain). 30 tablet 0   Multiple Vitamins-Minerals (LUTEIN-ZEAXANTHIN) TABS Take 1 tablet by mouth daily.     ondansetron  (ZOFRAN ) 8 MG tablet Take 1 tab (8 mg) by mouth every 8 hrs as needed for nausea/vomiting. Start third day after doxorubicin /cyclophosphamide  chemotherapy. 30 tablet 1   prochlorperazine  (COMPAZINE ) 10 MG tablet TAKE 1 TABLET(10 MG) BY MOUTH EVERY 6 HOURS AS NEEDED FOR NAUSEA OR VOMITING 30 tablet 1   sulfamethoxazole -trimethoprim  (BACTRIM  DS) 800-160 MG tablet Take 1 tablet by mouth 2 (two) times daily. 10 tablet 0   traMADol (ULTRAM) 50 MG tablet Take 50 mg by mouth every 6 (six) hours as needed.     traZODone  (DESYREL ) 100 MG tablet Take 200 mg by mouth at bedtime.     VITAMIN D  PO Place 1 Dose under the tongue daily. drops     No current facility-administered medications for this visit.   Facility-Administered Medications Ordered in Other Visits  Medication Dose Route Frequency Provider Last Rate Last Admin   0.9 %  sodium chloride  infusion   Intravenous Continuous Lanny Callander, MD   Stopped at 12/23/23 1253    PHYSICAL EXAMINATION: ECOG PERFORMANCE STATUS: 1 - Symptomatic but  completely ambulatory  Vitals:   12/23/23 0938  BP: 124/64  Pulse: 69  Resp: 16  Temp: (!) 97.5 F (36.4 C)  SpO2: 99%   Wt Readings from Last 3 Encounters:  12/23/23 147 lb 8 oz (66.9 kg)  12/16/23 146 lb (66.2 kg)  12/09/23 144 lb 14.4 oz (65.7 kg)     GENERAL:alert, no distress and comfortable SKIN: skin color, texture, turgor are normal, no rashes or significant lesions EYES: normal, Conjunctiva are pink and non-injected, sclera clear NECK: supple, thyroid  normal size, non-tender, without nodularity LYMPH:  no palpable lymphadenopathy in the cervical, axillary  LUNGS: clear to auscultation and percussion with normal breathing effort HEART: regular rate & rhythm and no murmurs and no lower extremity edema ABDOMEN:abdomen soft, non-tender and normal bowel sounds Musculoskeletal:no cyanosis of digits and no clubbing  NEURO: alert & oriented x 3 with fluent speech, no focal motor/sensory deficits  Physical Exam    LABORATORY DATA:  I have reviewed the data as listed    Latest Ref Rng &  Units 12/23/2023    9:23 AM 12/16/2023   10:38 AM 12/09/2023    9:31 AM  CBC  WBC 4.0 - 10.5 K/uL 3.3  4.0  4.4   Hemoglobin 12.0 - 15.0 g/dL 9.7  9.2  9.5   Hematocrit 36.0 - 46.0 % 29.3  28.2  28.2   Platelets 150 - 400 K/uL 251  258  303         Latest Ref Rng & Units 12/23/2023    9:23 AM 12/16/2023   10:38 AM 12/09/2023    9:31 AM  CMP  Glucose 70 - 99 mg/dL 97  97  98   BUN 8 - 23 mg/dL 16  13  16    Creatinine 0.44 - 1.00 mg/dL 9.22  9.23  9.29   Sodium 135 - 145 mmol/L 138  140  140   Potassium 3.5 - 5.1 mmol/L 3.9  3.9  3.9   Chloride 98 - 111 mmol/L 107  108  108   CO2 22 - 32 mmol/L 28  28  27    Calcium  8.9 - 10.3 mg/dL 9.3  9.2  9.3   Total Protein 6.5 - 8.1 g/dL 6.4  6.3  6.4   Total Bilirubin 0.0 - 1.2 mg/dL 0.4  0.4  0.4   Alkaline Phos 38 - 126 U/L 55  50  48   AST 15 - 41 U/L 16  15  10    ALT 0 - 44 U/L 21  19  14        RADIOGRAPHIC STUDIES: I have  personally reviewed the radiological images as listed and agreed with the findings in the report. No results found.    No orders of the defined types were placed in this encounter.  All questions were answered. The patient knows to call the clinic with any problems, questions or concerns. No barriers to learning was detected. The total time spent in the appointment was 25 minutes, including review of chart and various tests results, discussions about plan of care and coordination of care plan     Onita Mattock, MD 12/23/2023

## 2023-12-25 ENCOUNTER — Ambulatory Visit (HOSPITAL_COMMUNITY)
Admission: RE | Admit: 2023-12-25 | Discharge: 2023-12-25 | Disposition: A | Discharge status: Home / Self Care | Source: Ambulatory Visit | Attending: Hematology | Admitting: Hematology

## 2023-12-25 DIAGNOSIS — E785 Hyperlipidemia, unspecified: Secondary | ICD-10-CM | POA: Insufficient documentation

## 2023-12-25 DIAGNOSIS — Z171 Estrogen receptor negative status [ER-]: Secondary | ICD-10-CM | POA: Diagnosis not present

## 2023-12-25 DIAGNOSIS — Z01818 Encounter for other preprocedural examination: Secondary | ICD-10-CM | POA: Diagnosis present

## 2023-12-25 DIAGNOSIS — C50112 Malignant neoplasm of central portion of left female breast: Secondary | ICD-10-CM | POA: Insufficient documentation

## 2023-12-25 DIAGNOSIS — I351 Nonrheumatic aortic (valve) insufficiency: Secondary | ICD-10-CM | POA: Insufficient documentation

## 2023-12-25 DIAGNOSIS — Z01810 Encounter for preprocedural cardiovascular examination: Secondary | ICD-10-CM | POA: Diagnosis not present

## 2023-12-25 DIAGNOSIS — I10 Essential (primary) hypertension: Secondary | ICD-10-CM | POA: Insufficient documentation

## 2023-12-25 LAB — ECHOCARDIOGRAM COMPLETE
AR max vel: 2.78 cm2
AV Area VTI: 2.45 cm2
AV Area mean vel: 2.65 cm2
AV Mean grad: 2 mmHg
AV Peak grad: 4.9 mmHg
Ao pk vel: 1.11 m/s
Area-P 1/2: 2.99 cm2
MV VTI: 3.15 cm2
P 1/2 time: 496 ms
S' Lateral: 3 cm
Single Plane A4C EF: 60.7 %

## 2023-12-26 ENCOUNTER — Ambulatory Visit: Admitting: Rehabilitation

## 2023-12-26 DIAGNOSIS — Z9189 Other specified personal risk factors, not elsewhere classified: Secondary | ICD-10-CM

## 2023-12-26 DIAGNOSIS — Z9013 Acquired absence of bilateral breasts and nipples: Secondary | ICD-10-CM

## 2023-12-26 DIAGNOSIS — Z170 Estrogen receptor positive status [ER+]: Secondary | ICD-10-CM

## 2023-12-26 DIAGNOSIS — C50412 Malignant neoplasm of upper-outer quadrant of left female breast: Secondary | ICD-10-CM | POA: Diagnosis not present

## 2023-12-26 DIAGNOSIS — Z483 Aftercare following surgery for neoplasm: Secondary | ICD-10-CM

## 2023-12-26 NOTE — Therapy (Signed)
 OUTPATIENT PHYSICAL THERAPY  UPPER EXTREMITY ONCOLOGY   Patient Name: [name] MRN: [id number] DOB:06-13-1955, 68 y.o., female Today's Date: 12/26/2023  END OF SESSION:  PT End of Session - 12/26/23 1004     Visit Number 4    Number of Visits 7    Date for Recertification  01/23/24    Authorization Type none    PT Start Time 1003    PT Stop Time 1052    PT Time Calculation (min) 49 min    Activity Tolerance Patient tolerated treatment well    Behavior During Therapy [facility] for tasks assessed/performed           Past Medical History:  Diagnosis Date   Anxiety    Anxiety disorder    BRCA2 positive 07/14/2012   Cancer (HCC) 2008   left breast. Right Breast Cancer in 2020   Dyslipidemia    Fall    Goiter    History of radiation therapy    Hypothyroidism    Osteoporosis    Past Surgical History:  Procedure Laterality Date   ABDOMINAL HYSTERECTOMY  2009   with BSO   BREAST LUMPECTOMY  7991,8023, 1991   BREAST LUMPECTOMY WITH RADIOACTIVE SEED LOCALIZATION Right 10/02/2018   Procedure: RIGHT BREAST LUMPECTOMY WITH RADIOACTIVE SEED LOCALIZATION;  Surgeon: Curvin Deward MOULD, MD;  Location: [facility];  Service: General;  Laterality: Right;   COLONOSCOPY N/A 12/13/2015   Procedure: COLONOSCOPY;  Surgeon: Gladis MARLA Louder, MD;  Location: [facility];  Service: Endoscopy;  Laterality: N/A;   COLONOSCOPY  2023   FRACTURE SURGERY  2015   left hip   HARDWARE REMOVAL Left 07/08/2015   Procedure: LEFT HIP HARDWARE REMOVAL;  Surgeon: Dempsey Moan, MD;  Location: [facility];  Service: Orthopedics;  Laterality: Left;   MASTECTOMY W/ SENTINEL NODE BIOPSY Left 08/22/2023   Procedure: MASTECTOMY WITH SENTINEL LYMPH NODE BIOPSY;  Surgeon: Ebbie Cough, MD;  Location: [facility];  Service: General;  Laterality: Left;  LMA w/ PEC BLOCK 150 MINUTES LEFT MASTECTOMY LEFT AXILLARY SENTINEL NODE BIOPSY MAGTRACE AND BLUE DYE INJECTION   PORTACATH PLACEMENT N/A 08/22/2023    Procedure: INSERTION, TUNNELED CENTRAL VENOUS DEVICE, WITH PORT;  Surgeon: Ebbie Cough, MD;  Location: [facility];  Service: General;  Laterality: N/A;  PORT PLACEMENT WITH ULTRASOUND GUIDANCE   TOTAL MASTECTOMY Right 08/22/2023   Procedure: MASTECTOMY, SIMPLE;  Surgeon: Ebbie Cough, MD;  Location: [facility] OR;  Service: General;  Laterality: Right;  RIGHT RISK REDUCING MASTECTOMY   Patient Active Problem List   Diagnosis Date Noted   Port-A-Cath in place 09/23/2023   S/P bilateral mastectomy 08/22/2023   Malignant neoplasm of central portion of left breast in female, estrogen receptor negative (HCC) 07/17/2023   Anxiety 11/19/2022   Osteoporosis - [facility] 11/18/2022   Hypothyroidism 11/18/2022   Hyperlipidemia 11/18/2022   BRCA2 gene mutation positive in female 06/10/2022   Malignant neoplasm of central portion of right breast in female, estrogen receptor positive (HCC) 09/12/2018   Breast cancer, BRCA2 positive, left (HCC) 04/15/2017   Breast cancer screening, high risk patient 04/15/2017   Malignant neoplasm of upper-outer quadrant of left breast in female, estrogen receptor positive (HCC) 01/29/2013     REFERRING PROVIDER: Cough Ebbie, MD  REFERRING DIAG:  Diagnosis  [phone] (ICD-10-CM) - Postoperative state  C50.412,Z17.1 (ICD-10-CM) - Malignant neoplasm of upper-outer quadrant of left breast in female, estrogen receptor negative (HCC)  Z15.01,Z15.09 (ICD-10-CM) - BRCA2 positive    THERAPY DIAG:  Malignant  neoplasm of upper-outer quadrant of left breast in female, estrogen receptor positive [facility])  Aftercare following surgery for neoplasm  At risk for lymphedema  S/P bilateral mastectomy  ONSET DATE: 06/21/23  Rationale for Evaluation and Treatment: Rehabilitation  SUBJECTIVE:                                                                                                                                                                                            SUBJECTIVE STATEMENT: I am feeling okay today. I do not have any pain currently and think I can continue the exercises and stretching at home.   PERTINENT HISTORY: Left breast Grade 2 IDC, triple negative +BRCA2.  Bil mastectomy 08/22/23 with 6 negative nodes. Will have chemo AC-P. Hx of 2 other episodes of breast cancer 2008 in left breast and 2020 in Rt breast. Lymph nodes only out on the left, 2 out previously for a total of 8.  Never anything out on the right.  Hx of radiation bilaterally.  In chemotherapy currently. Osteoporosis   PAIN:  Are you having pain? No not now , the chest just feels tight.    PRECAUTIONS: Left arm lymphedema risk   RED FLAGS: None   WEIGHT BEARING RESTRICTIONS: No  FALLS:  Has patient fallen in last 6 months? No  LIVING ENVIRONMENT: Lives with: lives with their family and lives with their spouse  OCCUPATION: retired   LEISURE: back to tennis, lifting weights, exercising    HAND DOMINANCE: right   PRIOR LEVEL OF FUNCTION: Independent  PATIENT GOALS: get back to normal activity    OBJECTIVE: Note: Objective measures were completed at Evaluation unless otherwise noted.  COGNITION: Overall cognitive status: Within functional limits for tasks assessed   PALPATION: Cording noted in Left axilla and upper arm   OBSERVATIONS / OTHER ASSESSMENTS: incisions well healed    SENSATION: Some numbness chest and axilla   POSTURE: WNL  UPPER EXTREMITY AROM/PROM:  A/PROM RIGHT   eval  12/05/23  Shoulder extension 65 60  Shoulder flexion 120 145  Shoulder abduction 135 153  Shoulder internal rotation 85 85  Shoulder external rotation 85 80    (Blank rows = not tested)  A/PROM LEFT   eval 12/05/23 12/26/23  Shoulder extension 65 60 64  Shoulder flexion 122 145 147  Shoulder abduction 122 153 155  Shoulder internal rotation 85 85 93  Shoulder external rotation 82 95 95    (Blank rows = not tested)   UPPER EXTREMITY STRENGTH:    LYMPHEDEMA ASSESSMENTS:   LANDMARK RIGHT  eval 12/05/23  At axilla     15 cm proximal  to olecranon process  27.4  10 cm proximal to olecranon process 26.9 26.5  Olecranon process 24.5 24.5  15 cm proximal to ulnar styloid process  23  10 cm proximal to ulnar styloid process 19.3 19.0  Just proximal to ulnar styloid process 15.8 15.8  Across hand at thumb web space 19.6 19.5  At base of 2nd digit 6.5 6.7  (Blank rows = not tested)  LANDMARK LEFT  eval 12/05/23  At axilla     15 cm proximal to olecranon process  27.8  10 cm proximal to olecranon process 27 27.5  Olecranon process 25 25.4  15 cm proximal to ulnar styloid process  23.4  10 cm proximal to ulnar styloid process 19.5 19.3  Just proximal to ulnar styloid process 16.1 16.3  Across hand at thumb web space 19.6 19.6  At base of 2nd digit 6.7 6.7  (Blank rows = not tested)  Rt: Lt: 63ml difference   L-DEX LYMPHEDEMA SCREENING: BASELINE SCORE (UNILATERAL): 7.3  QUICK DASH SURVEY: 54% limited                                                                                                                            TREATMENT DATE:  12/26/23 Pulleys into flexion and abduction x 2 min each  Wall ball flexion 5 x 10  Wall slides scaption x 5 bil  The Supine Dowel flexion 5 x 10  Supine chest stretch 2x60  Snow angel x 10 Doorway stretch bil 2x20  Chest stretch with LTR x 10 bil  Manual Therapy STM to the left pectoralis and latissimus in neutral and then with PROM and pinning. PROM into flexion, abd, ER, and D2 diagonals   12/19/23 Tried on sleeve and this does fit well.   Pulleys into flexion and abduction x each  Wall ball flexion 5 x 5  The Supine Dowel flexion 5 x 10  Supine chest stretch 2x60  Snow angel x 10 Doorway stretch bil 2x20  Chest stretch with LTR x 6 bil  Manual Therapy STM to the left pectoralis and latissimus in neutral and then with PROM and pinning.  PROM into  flexion, abd, ER, and D2 diagonals   12/12/23 The Supine Dowel flexion 5 x 10  Supine chest stretch 2x60  Snow angel x 10 Doorway stretch bil 2x20  Chest stretch with LTR x 6 bil  Sidelying abduction AROM x 6 (easy)  Manual Therapy STM to the left pectoralis and latissimus in neutral and then with PROM and pinning.  PROM into flexion, abd, ER, and D2 diagonals  Demonstrated sleeve donning - pt thinks her sleeve may be a bit small so she will bring it on and try it a few more times.   12/05/23 Redid SOZO and performed eval Measured for garment and gave pt link on [employer] for juzo soft size 2 20-50mmHg Then updated HEP which was a reminder of previous exercises which pt  had not performed since discharge here.   The Supine Dowel flexion 5 x 10  Supine chest stretch 2x20  Abduction wall walking 15 x 2 Doorway stretch bil 2x20  Discussed POC  09/11/23 Eval performed Education on cording Edu and performance of HEP per below.  Completed SOZO and lymphedema education Discussed POC     PATIENT EDUCATION:  Education details: per today's note Person educated: Patient and Spouse Education method: Explanation, Demonstration, Tactile cues, Verbal cues, and Handouts Education comprehension: verbalized understanding and returned demonstration  HOME EXERCISE PROGRAM: Access Code: [email or web address] URL: [email or web address] Date: 12/05/2023 Prepared by: Saddie Raw  Exercises - Supine Shoulder Flexion Extension AAROM with Dowel  - 1-2 x daily - 7 x weekly - 10 reps - 5 seconds hold - Supine Chest Stretch with Elbows Bent  - 1 x daily - 7 x weekly - 1 sets - 3 reps - 30-60seconds hold - Seated Scapular Retraction  - 1 x daily - 7 x weekly - 1-3 sets - 10 reps - 2-3 seconds hold - Standing Shoulder Abduction Finger Walk at Wall  - 1 x daily - 7 x weekly - 1-3 sets - 10 reps - 20-30 seconds hold - Doorway Pec Stretch at 90 Degrees Abduction  - 1 x daily - 7 x weekly - 1-3 sets -  10 reps - 20-30 seconds hold  ASSESSMENT:  CLINICAL IMPRESSION: Pt demonstrates improved shoulder AROM in all directions with mild tightness reported. Standing snow angles are added to HEP since the patient reports she no longer feels a stretch when supine. Patient reports ease of muscle tightness following soft tissue manual therapy to the left pectoralis and latissimus muscles. The patient has met all of her short and long term goals and would like to be discharged at this time. Patient is educated on HEP and importance of continuing to stretch at home.  OBJECTIVE IMPAIRMENTS: decreased activity tolerance, decreased knowledge of condition, decreased knowledge of use of DME, decreased mobility, and decreased ROM.   ACTIVITY LIMITATIONS: carrying, lifting, and reach over head  PARTICIPATION LIMITATIONS: community activity and yard work  PERSONAL FACTORS: 1-2 comorbidities: previous hx of radiation and SLNB are also affecting patient's functional outcome.   REHAB POTENTIAL: Excellent  CLINICAL DECISION MAKING: Stable/uncomplicated  EVALUATION COMPLEXITY: Low  GOALS: Goals reviewed with patient? Yes  SHORT TERM GOALS: Target date: 09/11/23   Pt will be educated on initial HEP to start for post-op ROM improvements Baseline: Goal status: MET   LONG TERM GOALS: Target date: 01/23/24  Pt will obtain appropriate compression garment for the UE to manage intermittent swelling.   Baseline:  Goal status: MET  2.  Pt will be educated on lymphedema risk reduction and will have watched the ABC video Baseline:  Goal status: MET  3.  Pt will be educated on exercises to continue during chemotherapy to minimize muscle loss Baseline:  Goal status: MET   PLAN:  PT FREQUENCY: 1x / week   PT DURATION: 6 weeks   PLANNED INTERVENTIONS: 97110-Therapeutic exercises, 97530- Therapeutic activity, 97535- Self Care, 02859- Manual therapy, Patient/Family education, Balance training, Joint  mobilization, Therapeutic exercises, Therapeutic activity, Neuromuscular re-education, Gait training, and Self Care, orthotic fit training   PLAN FOR NEXT SESSION: bil ROM Lt>Rt, Lt cording work, MLD LT UE  Randall Pack, SPT  12/26/23, 10:59 am   I agree with the following treatment note after reviewing documentation. This session was performed under the supervision of a licensed clinician.  Saddie Raw, PT  12/26/23, 11:02 AM

## 2023-12-27 ENCOUNTER — Telehealth: Payer: Self-pay | Admitting: Hematology

## 2023-12-30 ENCOUNTER — Inpatient Hospital Stay

## 2023-12-30 ENCOUNTER — Ambulatory Visit: Admitting: Hematology

## 2023-12-30 VITALS — BP 119/66 | HR 61 | Temp 98.1°F | Resp 20 | Wt 145.8 lb

## 2023-12-30 DIAGNOSIS — C50912 Malignant neoplasm of unspecified site of left female breast: Secondary | ICD-10-CM

## 2023-12-30 DIAGNOSIS — E7849 Other hyperlipidemia: Secondary | ICD-10-CM

## 2023-12-30 DIAGNOSIS — Z5111 Encounter for antineoplastic chemotherapy: Secondary | ICD-10-CM | POA: Diagnosis not present

## 2023-12-30 DIAGNOSIS — E039 Hypothyroidism, unspecified: Secondary | ICD-10-CM

## 2023-12-30 LAB — CMP (CANCER CENTER ONLY)
ALT: 14 U/L (ref 0–44)
AST: 13 U/L — ABNORMAL LOW (ref 15–41)
Albumin: 4.1 g/dL (ref 3.5–5.0)
Alkaline Phosphatase: 54 U/L (ref 38–126)
Anion gap: 3 — ABNORMAL LOW (ref 5–15)
BUN: 12 mg/dL (ref 8–23)
CO2: 28 mmol/L (ref 22–32)
Calcium: 9.2 mg/dL (ref 8.9–10.3)
Chloride: 109 mmol/L (ref 98–111)
Creatinine: 0.85 mg/dL (ref 0.44–1.00)
GFR, Estimated: >60 mL/min (ref >60)
Glucose, Bld: 98 mg/dL (ref 70–99)
Potassium: 4 mmol/L (ref 3.5–5.1)
Sodium: 140 mmol/L (ref 135–145)
Total Bilirubin: 0.4 mg/dL (ref 0.0–1.2)
Total Protein: 6.4 g/dL — ABNORMAL LOW (ref 6.5–8.1)

## 2023-12-30 LAB — CBC WITH DIFFERENTIAL (CANCER CENTER ONLY)
Abs Immature Granulocytes: 0.04 K/uL (ref 0.00–0.07)
Basophils Absolute: 0.1 K/uL (ref 0.0–0.1)
Basophils Relative: 2 %
Eosinophils Absolute: 0.3 K/uL (ref 0.0–0.5)
Eosinophils Relative: 7 %
HCT: 29.4 % — ABNORMAL LOW (ref 36.0–46.0)
Hemoglobin: 9.9 g/dL — ABNORMAL LOW (ref 12.0–15.0)
Immature Granulocytes: 1 %
Lymphocytes Relative: 17 %
Lymphs Abs: 0.7 K/uL (ref 0.7–4.0)
MCH: 31.2 pg (ref 26.0–34.0)
MCHC: 33.7 g/dL (ref 30.0–36.0)
MCV: 92.7 fL (ref 80.0–100.0)
Monocytes Absolute: 0.3 K/uL (ref 0.1–1.0)
Monocytes Relative: 8 %
Neutro Abs: 2.6 K/uL (ref 1.7–7.7)
Neutrophils Relative %: 65 %
Platelet Count: 255 K/uL (ref 150–400)
RBC: 3.17 MIL/uL — ABNORMAL LOW (ref 3.87–5.11)
RDW: 15.1 % (ref 11.5–15.5)
WBC Count: 3.9 K/uL — ABNORMAL LOW (ref 4.0–10.5)
nRBC: 0 % (ref 0.0–0.2)

## 2023-12-30 MED ORDER — DEXAMETHASONE SODIUM PHOSPHATE 10 MG/ML IJ SOLN
10.0000 mg | Freq: Once | INTRAMUSCULAR | Status: AC
  Administered 2023-12-30: 10 mg via INTRAVENOUS
  Filled 2023-12-30: qty 1

## 2023-12-30 MED ORDER — CETIRIZINE HCL 10 MG/ML IV SOLN
10.0000 mg | Freq: Once | INTRAVENOUS | Status: AC
  Administered 2023-12-30: 10 mg via INTRAVENOUS
  Filled 2023-12-30: qty 1

## 2023-12-30 MED ORDER — SODIUM CHLORIDE 0.9 % IV SOLN: INTRAVENOUS | Status: DC

## 2023-12-30 MED ORDER — SODIUM CHLORIDE 0.9 % IV SOLN
80.0000 mg/m2 | Freq: Once | INTRAVENOUS | Status: AC
  Administered 2023-12-30: 138 mg via INTRAVENOUS
  Filled 2023-12-30: qty 23

## 2023-12-30 MED ORDER — FAMOTIDINE IN NACL 20-0.9 MG/50ML-% IV SOLN
20.0000 mg | Freq: Once | INTRAVENOUS | Status: AC
  Administered 2023-12-30: 20 mg via INTRAVENOUS
  Filled 2023-12-30: qty 50

## 2023-12-30 NOTE — Patient Instructions (Signed)

## 2024-01-02 ENCOUNTER — Encounter: Admitting: Rehabilitation

## 2024-01-05 NOTE — Assessment & Plan Note (Signed)
-  pT2N0M0 stage IIA, ER-/PR-/HER2-, G3 -breast MRI 09/11/22 detected a non-mass enhancement in L breast but it was felt to be a blood vessel when biopsy was attempted.  A close follow-up breast MRI 12/14/22 showed persistent linear non mass enhancement in the lateral posterior left breast, bx was benign and concordant.  -breast MRI 06/21/2023 showed a 1.0 cm of non mass enhancement/possible enhancing mass with progressive Kinetics which is suspicious for malignancy, biopsy showed grade 2 invasive ductal carcinoma, triple negative.  The area measures 0.8 cm on ultrasound. - She underwent a bilateral mastectomy in the left lymph node dissection on Aug 22, 2023. -Surgical path showed 2.8 cm triple negative left breast adeno carcinoma, negative margins, all 6 lymph nodes were negative. -I recommend adjuvant chemotherapy AC-T, she started on 09/30/2023

## 2024-01-06 ENCOUNTER — Inpatient Hospital Stay: Attending: Hematology

## 2024-01-06 ENCOUNTER — Inpatient Hospital Stay

## 2024-01-06 ENCOUNTER — Inpatient Hospital Stay (HOSPITAL_BASED_OUTPATIENT_CLINIC_OR_DEPARTMENT_OTHER): Admitting: Hematology

## 2024-01-06 VITALS — BP 124/64 | HR 66 | Temp 97.2°F | Resp 16 | Ht 64.0 in | Wt 147.8 lb

## 2024-01-06 VITALS — BP 113/67 | HR 65 | Resp 16

## 2024-01-06 DIAGNOSIS — Z1722 Progesterone receptor negative status: Secondary | ICD-10-CM | POA: Insufficient documentation

## 2024-01-06 DIAGNOSIS — G8929 Other chronic pain: Secondary | ICD-10-CM | POA: Diagnosis not present

## 2024-01-06 DIAGNOSIS — G629 Polyneuropathy, unspecified: Secondary | ICD-10-CM | POA: Diagnosis not present

## 2024-01-06 DIAGNOSIS — C50111 Malignant neoplasm of central portion of right female breast: Secondary | ICD-10-CM | POA: Diagnosis not present

## 2024-01-06 DIAGNOSIS — Z1502 Genetic susceptibility to malignant neoplasm of ovary: Secondary | ICD-10-CM | POA: Diagnosis not present

## 2024-01-06 DIAGNOSIS — Z5111 Encounter for antineoplastic chemotherapy: Secondary | ICD-10-CM | POA: Diagnosis present

## 2024-01-06 DIAGNOSIS — T451X5A Adverse effect of antineoplastic and immunosuppressive drugs, initial encounter: Secondary | ICD-10-CM | POA: Insufficient documentation

## 2024-01-06 DIAGNOSIS — R110 Nausea: Secondary | ICD-10-CM | POA: Diagnosis not present

## 2024-01-06 DIAGNOSIS — C50912 Malignant neoplasm of unspecified site of left female breast: Secondary | ICD-10-CM

## 2024-01-06 DIAGNOSIS — Z79890 Hormone replacement therapy (postmenopausal): Secondary | ICD-10-CM | POA: Diagnosis not present

## 2024-01-06 DIAGNOSIS — Z9013 Acquired absence of bilateral breasts and nipples: Secondary | ICD-10-CM | POA: Insufficient documentation

## 2024-01-06 DIAGNOSIS — S72002A Fracture of unspecified part of neck of left femur, initial encounter for closed fracture: Secondary | ICD-10-CM | POA: Diagnosis not present

## 2024-01-06 DIAGNOSIS — E785 Hyperlipidemia, unspecified: Secondary | ICD-10-CM | POA: Diagnosis not present

## 2024-01-06 DIAGNOSIS — Z923 Personal history of irradiation: Secondary | ICD-10-CM | POA: Insufficient documentation

## 2024-01-06 DIAGNOSIS — E039 Hypothyroidism, unspecified: Secondary | ICD-10-CM | POA: Insufficient documentation

## 2024-01-06 DIAGNOSIS — Z1732 Human epidermal growth factor receptor 2 negative status: Secondary | ICD-10-CM | POA: Diagnosis not present

## 2024-01-06 DIAGNOSIS — C50112 Malignant neoplasm of central portion of left female breast: Secondary | ICD-10-CM | POA: Diagnosis not present

## 2024-01-06 DIAGNOSIS — M810 Age-related osteoporosis without current pathological fracture: Secondary | ICD-10-CM | POA: Insufficient documentation

## 2024-01-06 DIAGNOSIS — Z79899 Other long term (current) drug therapy: Secondary | ICD-10-CM | POA: Diagnosis not present

## 2024-01-06 DIAGNOSIS — Z1501 Genetic susceptibility to malignant neoplasm of breast: Secondary | ICD-10-CM | POA: Insufficient documentation

## 2024-01-06 DIAGNOSIS — Z171 Estrogen receptor negative status [ER-]: Secondary | ICD-10-CM

## 2024-01-06 LAB — CBC WITH DIFFERENTIAL (CANCER CENTER ONLY)
Abs Immature Granulocytes: 0.02 K/uL (ref 0.00–0.07)
Basophils Absolute: 0.1 K/uL (ref 0.0–0.1)
Basophils Relative: 1 %
Eosinophils Absolute: 0.1 K/uL (ref 0.0–0.5)
Eosinophils Relative: 4 %
HCT: 29.9 % — ABNORMAL LOW (ref 36.0–46.0)
Hemoglobin: 10 g/dL — ABNORMAL LOW (ref 12.0–15.0)
Immature Granulocytes: 1 %
Lymphocytes Relative: 18 %
Lymphs Abs: 0.7 K/uL (ref 0.7–4.0)
MCH: 30.6 pg (ref 26.0–34.0)
MCHC: 33.4 g/dL (ref 30.0–36.0)
MCV: 91.4 fL (ref 80.0–100.0)
Monocytes Absolute: 0.3 K/uL (ref 0.1–1.0)
Monocytes Relative: 7 %
Neutro Abs: 2.6 K/uL (ref 1.7–7.7)
Neutrophils Relative %: 69 %
Platelet Count: 279 K/uL (ref 150–400)
RBC: 3.27 MIL/uL — ABNORMAL LOW (ref 3.87–5.11)
RDW: 14.6 % (ref 11.5–15.5)
WBC Count: 3.8 K/uL — ABNORMAL LOW (ref 4.0–10.5)
nRBC: 0 % (ref 0.0–0.2)

## 2024-01-06 LAB — CMP (CANCER CENTER ONLY)
ALT: 13 U/L (ref 0–44)
AST: 12 U/L — ABNORMAL LOW (ref 15–41)
Albumin: 4.1 g/dL (ref 3.5–5.0)
Alkaline Phosphatase: 58 U/L (ref 38–126)
Anion gap: 6 (ref 5–15)
BUN: 11 mg/dL (ref 8–23)
CO2: 28 mmol/L (ref 22–32)
Calcium: 9.7 mg/dL (ref 8.9–10.3)
Chloride: 105 mmol/L (ref 98–111)
Creatinine: 0.9 mg/dL (ref 0.44–1.00)
GFR, Estimated: >60 mL/min (ref >60)
Glucose, Bld: 91 mg/dL (ref 70–99)
Potassium: 3.9 mmol/L (ref 3.5–5.1)
Sodium: 139 mmol/L (ref 135–145)
Total Bilirubin: 0.3 mg/dL (ref 0.0–1.2)
Total Protein: 6.5 g/dL (ref 6.5–8.1)

## 2024-01-06 MED ORDER — DEXAMETHASONE SODIUM PHOSPHATE 10 MG/ML IJ SOLN
10.0000 mg | Freq: Once | INTRAMUSCULAR | Status: AC
  Administered 2024-01-06: 10 mg via INTRAVENOUS
  Filled 2024-01-06: qty 1

## 2024-01-06 MED ORDER — FAMOTIDINE IN NACL 20-0.9 MG/50ML-% IV SOLN
20.0000 mg | Freq: Once | INTRAVENOUS | Status: AC
  Administered 2024-01-06: 20 mg via INTRAVENOUS
  Filled 2024-01-06: qty 50

## 2024-01-06 MED ORDER — SODIUM CHLORIDE 0.9 % IV SOLN: INTRAVENOUS | Status: DC

## 2024-01-06 MED ORDER — CETIRIZINE HCL 10 MG/ML IV SOLN
10.0000 mg | Freq: Once | INTRAVENOUS | Status: AC
  Administered 2024-01-06: 10 mg via INTRAVENOUS
  Filled 2024-01-06: qty 1

## 2024-01-06 MED ORDER — SODIUM CHLORIDE 0.9 % IV SOLN
80.0000 mg/m2 | Freq: Once | INTRAVENOUS | Status: AC
  Administered 2024-01-06: 138 mg via INTRAVENOUS
  Filled 2024-01-06: qty 23

## 2024-01-06 NOTE — Addendum Note (Signed)
 Addended by: Tuwana Kapaun P on: 01/06/2024 04:31 PM   Modules accepted: Orders

## 2024-01-06 NOTE — Progress Notes (Signed)
 Verbal order w/readback order from Dr. Lanny for [facility] to be drawn.  Order placed in EPIC and in [facility] portal.  [facility] kt and requisition given to [facility] Lab Receptionist.

## 2024-01-06 NOTE — Patient Instructions (Signed)
 [facility] - A DEPT OF [facility]   Discharge Instructions: Thank you for choosing [facility] to provide your oncology and hematology care.   If you have a lab appointment with the Cancer Center, please go directly to the Cancer Center and check in at the registration area.   Wear comfortable clothing and clothing appropriate for easy access to any Portacath or PICC line.   We strive to give you quality time with your provider. You may need to reschedule your appointment if you arrive late (15 or more minutes).  Arriving late affects you and other patients whose appointments are after yours.  Also, if you miss three or more appointments without notifying the office, you may be dismissed from the clinic at the provider's discretion.      For prescription refill requests, have your pharmacy contact our office and allow 72 hours for refills to be completed.    Today you received the following chemotherapy and/or immunotherapy agents: Paclitaxel (Taxol)      To help prevent nausea and vomiting after your treatment, we encourage you to take your nausea medication as directed.  BELOW ARE SYMPTOMS THAT SHOULD BE REPORTED IMMEDIATELY: *FEVER GREATER THAN 100.4 F (38 C) OR HIGHER *CHILLS OR SWEATING *NAUSEA AND VOMITING THAT IS NOT CONTROLLED WITH YOUR NAUSEA MEDICATION *UNUSUAL SHORTNESS OF BREATH *UNUSUAL BRUISING OR BLEEDING *URINARY PROBLEMS (pain or burning when urinating, or frequent urination) *BOWEL PROBLEMS (unusual diarrhea, constipation, pain near the anus) TENDERNESS IN MOUTH AND THROAT WITH OR WITHOUT PRESENCE OF ULCERS (sore throat, sores in mouth, or a toothache) UNUSUAL RASH, SWELLING OR PAIN  UNUSUAL VAGINAL DISCHARGE OR ITCHING   Items with * indicate a potential emergency and should be followed up as soon as possible or go to the Emergency Department if any problems should occur.  Please show the CHEMOTHERAPY ALERT CARD or  IMMUNOTHERAPY ALERT CARD at check-in to the Emergency Department and triage nurse.  Should you have questions after your visit or need to cancel or reschedule your appointment, please contact [facility] - A DEPT OF [facility]  Dept: [phone]  and follow the prompts.  Office hours are 8:00 a.m. to 4:30 p.m. Monday - Friday. Please note that voicemails left after 4:00 p.m. may not be returned until the following business day.  We are closed weekends and major holidays. You have access to a nurse at all times for urgent questions. Please call the main number to the clinic Dept: [phone] and follow the prompts.   For any non-urgent questions, you may also contact your provider using MyChart. We now offer e-Visits for anyone 76 and older to request care online for non-urgent symptoms. For details visit [email or web address].   Also download the MyChart app! Go to the app store, search "MyChart", open the app, select [place], and log in with your MyChart username and password.

## 2024-01-06 NOTE — Progress Notes (Signed)
 [facility]   [phone] [phone]   Clinic Follow up Note   Patient Care Team: Geofm Glade PARAS, MD as PCP - General (Internal Medicine) Tasia Lung, MD as Consulting Physician (Psychiatry) Melodi Lerner, MD as Consulting Physician (Orthopedic Surgery) Barbette Knock, MD as Consulting Physician (Obstetrics and Gynecology) Shannon Agent, MD as Consulting Physician (Radiation Oncology) Lanny Callander, MD as Consulting Physician (Hematology) [facility] as Consulting Physician (Radiology) Lita Lye, OD as Referring Physician (Optometry) Ebbie Cough, MD as Consulting Physician (General Surgery) Tyree Nanetta SAILOR, RN as Oncology Nurse Navigator  Date of Service:  01/06/2024  CHIEF COMPLAINT: f/u of breast cancer  CURRENT THERAPY:  Adjuvant chemotherapy with weekly paclitaxel   Oncology History   Malignant neoplasm of central portion of left breast in female, estrogen receptor negative (HCC) -pT2N0M0 stage IIA, ER-/PR-/HER2-, G3 -breast MRI 09/11/22 detected a non-mass enhancement in L breast but it was felt to be a blood vessel when biopsy was attempted.  A close follow-up breast MRI 12/14/22 showed persistent linear non mass enhancement in the lateral posterior left breast, bx was benign and concordant.  -breast MRI 06/21/2023 showed a 1.0 cm of non mass enhancement/possible enhancing mass with progressive Kinetics which is suspicious for malignancy, biopsy showed grade 2 invasive ductal carcinoma, triple negative.  The area measures 0.8 cm on ultrasound. - She underwent a bilateral mastectomy in the left lymph node dissection on Aug 22, 2023. -Surgical path showed 2.8 cm triple negative left breast adeno carcinoma, negative margins, all 6 lymph nodes were negative. -I recommend adjuvant chemotherapy AC-T, she started on 09/30/2023  Assessment & Plan Breast cancer, central portion of left breast, on active  treatment Currently undergoing chemotherapy with stable blood counts. Slight leukopenia at 3.8, not contraindicating treatment. Hemoglobin at 10. Normal renal and hepatic function. No new neuropathy symptoms. Nail discoloration due to chemotherapy, expected to resolve. - Continue chemotherapy as scheduled - Reassure regarding nail discoloration  Chronic pain after left hip fracture Intermittent left hip pain post-2015 fracture, managed with acetaminophen , heat, and activity modification. No recent trauma. Pain does not affect sleep. No immediate imaging needed unless symptoms worsen. - Continue acetaminophen  and heat for pain management - Advise using cushion or pillow when sitting - Encourage indoor walking, avoid outdoor walks temporarily  Hypothyroidism Managed by primary care with recent dose adjustment. Follow-up TSH and free T4 levels pending. - Order TSH and free T4 tests next week - Coordinate with primary care for thyroid  management  Plan - She is tolerating treatment well, will continue weekly - Will monitor her neuropathy - Follow-up in 2 weeks   SUMMARY OF ONCOLOGIC HISTORY: Oncology History  Breast cancer, BRCA2 positive, left (HCC)  04/15/2017 Initial Diagnosis   Breast cancer, BRCA2 positive, left (HCC)   09/30/2023 -  Chemotherapy   Patient is on Treatment Plan : BREAST DOSE DENSE AC q14d / PACLitaxel  q7d     Malignant neoplasm of central portion of right breast in female, estrogen receptor positive (HCC)  09/12/2018 Initial Diagnosis   Ductal carcinoma in situ (DCIS) of right breast   09/17/2018 Cancer Staging   Staging form: Breast, AJCC 8th Edition - Clinical: Stage 0 (cTis (DCIS), cN0, cM0, ER+, PR+, HER2: Not Assessed) - Signed by Crawford Morna Pickle, NP on 09/17/2018   10/02/2018 Cancer Staging   Staging form: Breast, AJCC 8th Edition - Pathologic stage from 10/02/2018: Stage IA (pT1b, pN0, cM0, G2, ER+, PR+, HER2-) - Signed by Crawford Morna Pickle,  NP  on 10/15/2018   Malignant neoplasm of central portion of left breast in female, estrogen receptor negative (HCC)  07/15/2023 Cancer Staging   Staging form: Breast, AJCC 8th Edition - Clinical stage from 07/15/2023: Stage IB (cT1b, cN0, cM0, G2, ER-, PR-, HER2-) - Signed by Lanny Callander, MD on 07/17/2023 Stage prefix: Initial diagnosis Histologic grading system: 3 grade system   07/17/2023 Initial Diagnosis   Malignant neoplasm of central portion of left breast in female, estrogen receptor negative (HCC)   08/22/2023 Cancer Staging   Staging form: Breast, AJCC 8th Edition - Pathologic stage from 08/22/2023: Stage IIA (pT2, pN0, cM0, G3, ER-, PR-, HER2-) - Signed by Lanny Callander, MD on 12/09/2023 Histologic grading system: 3 grade system Residual tumor (R): R0      Discussed the use of AI scribe software for clinical note transcription with the patient, who gave verbal consent to proceed.  History of Present Illness [name] is a 68 year old female with breast cancer who presents for follow-up.  No new symptoms related to cancer treatment are present. Blood work shows a slightly low white blood cell count at 3.8 and stable hemoglobin at 10. Kidney and liver function are normal, with slightly elevated protein levels.  She experiences intermittent buttock pain from a previous hip fracture in 2015, managed with a heating pad and Tylenol . The pain does not disturb her sleep, and she has reduced walking activities to manage discomfort. There are no recent falls, injuries, numbness, or tingling.  Current medications include Tylenol  for pain. She is no longer on dexamethasone  and has adequate nausea medication. She is not taking Bactrim . Her Synthroid  dosage was adjusted several months ago, but thyroid  levels have not been rechecked since.     All other systems were reviewed with the patient and are negative.  MEDICAL HISTORY:  Past Medical History:  Diagnosis Date   Anxiety    Anxiety  disorder    BRCA2 positive 07/14/2012   Cancer (HCC) 2008   left breast. Right Breast Cancer in 2020   Dyslipidemia    Fall    Goiter    History of radiation therapy    Hypothyroidism    Osteoporosis     SURGICAL HISTORY: Past Surgical History:  Procedure Laterality Date   ABDOMINAL HYSTERECTOMY  2009   with BSO   BREAST LUMPECTOMY  [id number], 1991   BREAST LUMPECTOMY WITH RADIOACTIVE SEED LOCALIZATION Right 10/02/2018   Procedure: RIGHT BREAST LUMPECTOMY WITH RADIOACTIVE SEED LOCALIZATION;  Surgeon: Curvin Deward MOULD, MD;  Location: [facility];  Service: General;  Laterality: Right;   COLONOSCOPY N/A 12/13/2015   Procedure: COLONOSCOPY;  Surgeon: Gladis MARLA Louder, MD;  Location: [facility];  Service: Endoscopy;  Laterality: N/A;   COLONOSCOPY  2023   FRACTURE SURGERY  2015   left hip   HARDWARE REMOVAL Left 07/08/2015   Procedure: LEFT HIP HARDWARE REMOVAL;  Surgeon: Dempsey Moan, MD;  Location: [facility];  Service: Orthopedics;  Laterality: Left;   MASTECTOMY W/ SENTINEL NODE BIOPSY Left 08/22/2023   Procedure: MASTECTOMY WITH SENTINEL LYMPH NODE BIOPSY;  Surgeon: Ebbie Cough, MD;  Location: [facility] OR;  Service: General;  Laterality: Left;  LMA w/ PEC BLOCK 150 MINUTES LEFT MASTECTOMY LEFT AXILLARY SENTINEL NODE BIOPSY MAGTRACE AND BLUE DYE INJECTION   PORTACATH PLACEMENT N/A 08/22/2023   Procedure: INSERTION, TUNNELED CENTRAL VENOUS DEVICE, WITH PORT;  Surgeon: Ebbie Cough, MD;  Location: [facility] OR;  Service: General;  Laterality: N/A;  PORT PLACEMENT  WITH ULTRASOUND GUIDANCE   TOTAL MASTECTOMY Right 08/22/2023   Procedure: MASTECTOMY, SIMPLE;  Surgeon: Ebbie Cough, MD;  Location: [facility] OR;  Service: General;  Laterality: Right;  RIGHT RISK REDUCING MASTECTOMY    I have reviewed the social history and family history with the patient and they are unchanged from previous note.  ALLERGIES:  is allergic to adhesive [tape], chloraprep one step [chlorhexidine   gluconate], latex, shellfish allergy, shellfish-derived products, cephalexin, ampicillin, betadine  [povidone iodine ], and macrodantin [nitrofurantoin].  MEDICATIONS:  Current Outpatient Medications  Medication Sig Dispense Refill   atorvastatin  (LIPITOR) 10 MG tablet Take 0.5 tablets (5 mg total) by mouth daily. 90 tablet 1   clorazepate (TRANXENE) 15 MG tablet Take 7.5-15 mg by mouth See admin instructions. Take 15 mg at bedtime, may take a 7.5 mg dose in the morning as needed anxiety     docusate sodium  (COLACE) 250 MG capsule Take 250 mg by mouth daily.     fluconazole  (DIFLUCAN ) 150 MG tablet Take 1 tablet (150 mg total) by mouth daily. 1 tablet 0   levothyroxine  (SYNTHROID ) 125 MCG tablet Take 1 tablet (125 mcg total) by mouth daily. Take 5 days a  week. (Patient taking differently: Take 125 mcg by mouth every Monday, Tuesday, Wednesday, Thursday, and Friday. Take 5 days a  week.) 90 tablet 1   lidocaine -prilocaine  (EMLA ) cream Apply to affected area once 30 g 3   methocarbamol  (ROBAXIN ) 500 MG tablet Take 1 tablet (500 mg total) by mouth every 8 (eight) hours as needed (use for muscle cramps/pain). 30 tablet 0   Multiple Vitamins-Minerals (LUTEIN-ZEAXANTHIN) TABS Take 1 tablet by mouth daily.     ondansetron  (ZOFRAN ) 8 MG tablet Take 1 tab (8 mg) by mouth every 8 hrs as needed for nausea/vomiting. Start third day after doxorubicin /cyclophosphamide  chemotherapy. 30 tablet 1   prochlorperazine  (COMPAZINE ) 10 MG tablet TAKE 1 TABLET(10 MG) BY MOUTH EVERY 6 HOURS AS NEEDED FOR NAUSEA OR VOMITING 30 tablet 1   traMADol (ULTRAM) 50 MG tablet Take 50 mg by mouth every 6 (six) hours as needed.     traZODone  (DESYREL ) 100 MG tablet Take 200 mg by mouth at bedtime.     VITAMIN D  PO Place 1 Dose under the tongue daily. drops     No current facility-administered medications for this visit.   Facility-Administered Medications Ordered in Other Visits  Medication Dose Route Frequency Provider Last Rate  Last Admin   0.9 %  sodium chloride  infusion   Intravenous Continuous Lanny Callander, MD 10 mL/hr at 01/06/24 1109 New Bag at 01/06/24 1109   PACLitaxel  (TAXOL ) 138 mg in sodium chloride  0.9 % 250 mL chemo infusion (</= 80mg /m2)  80 mg/m2 (Treatment Plan Recorded) Intravenous Once Lanny Callander, MD 273 mL/hr at 01/06/24 1158 138 mg at 01/06/24 1158    PHYSICAL EXAMINATION: ECOG PERFORMANCE STATUS: 1 - Symptomatic but completely ambulatory  Vitals:   01/06/24 1038  BP: 124/64  Pulse: 66  Resp: 16  Temp: (!) 97.2 F (36.2 C)  SpO2: 97%   Wt Readings from Last 3 Encounters:  01/06/24 147 lb 12.8 oz (67 kg)  12/30/23 145 lb 12.8 oz (66.1 kg)  12/23/23 147 lb 8 oz (66.9 kg)     GENERAL:alert, no distress and comfortable SKIN: skin color, texture, turgor are normal, no rashes or significant lesions EYES: normal, Conjunctiva are pink and non-injected, sclera clear NECK: supple, thyroid  normal size, non-tender, without nodularity LYMPH:  no palpable lymphadenopathy in the cervical, axillary  LUNGS:  clear to auscultation and percussion with normal breathing effort HEART: regular rate & rhythm and no murmurs and no lower extremity edema ABDOMEN:abdomen soft, non-tender and normal bowel sounds Musculoskeletal:no cyanosis of digits and no clubbing  NEURO: alert & oriented x 3 with fluent speech, no focal motor/sensory deficits except minimal  vibration sensation loss on hands  Physical Exam   LABORATORY DATA:  I have reviewed the data as listed    Latest Ref Rng & Units 01/06/2024    9:53 AM 12/30/2023   10:27 AM 12/23/2023    9:23 AM  CBC  WBC 4.0 - 10.5 K/uL 3.8  3.9  3.3   Hemoglobin 12.0 - 15.0 g/dL 89.9  9.9  9.7   Hematocrit 36.0 - 46.0 % 29.9  29.4  29.3   Platelets 150 - 400 K/uL 279  255  251         Latest Ref Rng & Units 01/06/2024    9:53 AM 12/30/2023   10:27 AM 12/23/2023    9:23 AM  CMP  Glucose 70 - 99 mg/dL 91  98  97   BUN 8 - 23 mg/dL 11  12  16    Creatinine 0.44 -  1.00 mg/dL 9.09  9.14  9.22   Sodium 135 - 145 mmol/L 139  140  138   Potassium 3.5 - 5.1 mmol/L 3.9  4.0  3.9   Chloride 98 - 111 mmol/L 105  109  107   CO2 22 - 32 mmol/L 28  28  28    Calcium  8.9 - 10.3 mg/dL 9.7  9.2  9.3   Total Protein 6.5 - 8.1 g/dL 6.5  6.4  6.4   Total Bilirubin 0.0 - 1.2 mg/dL 0.3  0.4  0.4   Alkaline Phos 38 - 126 U/L 58  54  55   AST 15 - 41 U/L 12  13  16    ALT 0 - 44 U/L 13  14  21        RADIOGRAPHIC STUDIES: I have personally reviewed the radiological images as listed and agreed with the findings in the report. No results found.    Orders Placed This Encounter  Procedures   TSH    Standing Status:   Future    Expected Date:   01/13/2024    Expiration Date:   01/05/2025   T4, free    Standing Status:   Future    Expected Date:   01/13/2024    Expiration Date:   01/05/2025   All questions were answered. The patient knows to call the clinic with any problems, questions or concerns. No barriers to learning was detected. The total time spent in the appointment was 25 minutes, including review of chart and various tests results, discussions about plan of care and coordination of care plan     Onita Mattock, MD 01/06/2024

## 2024-01-09 ENCOUNTER — Encounter: Payer: Self-pay | Admitting: Psychology

## 2024-01-09 ENCOUNTER — Ambulatory Visit (HOSPITAL_COMMUNITY)
Admission: RE | Admit: 2024-01-09 | Discharge: 2024-01-09 | Disposition: A | Discharge status: Home / Self Care | Source: Ambulatory Visit | Attending: Nurse Practitioner | Admitting: Nurse Practitioner

## 2024-01-09 ENCOUNTER — Ambulatory Visit: Admitting: Psychology

## 2024-01-09 ENCOUNTER — Other Ambulatory Visit: Payer: Self-pay | Admitting: Nurse Practitioner

## 2024-01-09 DIAGNOSIS — Z1501 Genetic susceptibility to malignant neoplasm of breast: Secondary | ICD-10-CM | POA: Diagnosis present

## 2024-01-09 DIAGNOSIS — C50912 Malignant neoplasm of unspecified site of left female breast: Secondary | ICD-10-CM | POA: Diagnosis present

## 2024-01-09 DIAGNOSIS — Z15060 Genetic susceptibility to colorectal cancer: Secondary | ICD-10-CM | POA: Diagnosis present

## 2024-01-09 DIAGNOSIS — Z1507 Genetic susceptibility to malignant neoplasm of urinary tract: Secondary | ICD-10-CM | POA: Insufficient documentation

## 2024-01-09 DIAGNOSIS — Z15068 Genetic susceptibility to other malignant neoplasm of digestive system: Secondary | ICD-10-CM | POA: Insufficient documentation

## 2024-01-09 DIAGNOSIS — Z1502 Genetic susceptibility to malignant neoplasm of ovary: Secondary | ICD-10-CM | POA: Insufficient documentation

## 2024-01-09 DIAGNOSIS — Z1505 Genetic susceptibility to malignant neoplasm of fallopian tube(s): Secondary | ICD-10-CM | POA: Insufficient documentation

## 2024-01-09 DIAGNOSIS — Z1509 Genetic susceptibility to other malignant neoplasm: Secondary | ICD-10-CM | POA: Diagnosis present

## 2024-01-09 DIAGNOSIS — F411 Generalized anxiety disorder: Secondary | ICD-10-CM | POA: Diagnosis not present

## 2024-01-09 NOTE — Progress Notes (Signed)
 [facility] Counselor Initial Adult Exam  Name: [name] Date: 01/09/2024 MRN: [id number] DOB: [birth year] PCP: Geofm Glade PARAS, MD  Time spent: 12:01pm-12:59pm   Pt is seen for an in person visit.  Guardian/Payee:  self     Paperwork requested: No   Reason for Visit /Presenting Problem: Pt is referred by her psychiatrist, Dr. Dewanda for anxiety.  Pt reports she has been tx for anxiety for 30 years and medication has been beneficial.  Pt reports she had counseling in the past with Dr. Dallie several years ago through her struggles w/ her son his addiction and death of her first husband.  Pt reports she has recognized need to return to counseling as struggling w/ anxiety, worry, rumination, and thinking worst case scenario.  Pt dx w/ BRCA2 Breast Cancer and going through her 3rd round of cancer- 1st at 68y/o, then at 68y/o- both requiring lumpectomy and radiation and most recent dx in March- had double mastectomy and currently in chemotherapy tx.  Pt reports she currently knows 8 people who are dealing w/ cancer dx and 2 close friends w/ poor prognosis.  Pt reports she is the type that will be there for others, people pleaser, empath and difficulty being friend to herself.    Mental Status Exam: Appearance:   Well Groomed     Behavior:  Appropriate  Motor:  Normal  Speech/Language:   Clear and Coherent and Normal Rate  Affect:  Appropriate  Mood:  anxious  Thought process:  normal  Thought content:    WNL  Sensory/Perceptual disturbances:    WNL  Orientation:  oriented to person, place, time/date, and situation  Attention:  Good  Concentration:  Good  Memory:  WNL  Fund of knowledge:   Good  Insight:    Good  Judgment:   Good  Impulse Control:  Good   Reported Symptoms:  Pt endorsed feeling anxious and on edge daily, w/ ruminating worries and castrophizing.  Pt reports difficult for her to be present and enjoy things present as worried about what if and worst case  scenario.  Pt scored 9 on GAD 7 and 8 on PHQ9. Pt denies depressive episode symptoms.  Pt reports fatigue and some chemo brain fog.  Pt reports struggled w/ panic attacks in 30s and 40s but not current concern.    Risk Assessment: Danger to Self:  No Self-injurious Behavior: No Danger to Others: No Duty to Warn:no Physical Aggression / Violence:No  Access to Firearms a concern: No  Gang Involvement:No  Patient / guardian was educated about steps to take if suicide or homicide risk level increases between visits: n/a While future psychiatric events cannot be accurately predicted, the patient does not currently require acute inpatient psychiatric care and does not currently meet [facility]  involuntary commitment criteria.  Substance Abuse History: Current substance abuse: No     Past Psychiatric History:   Previous psychological history is significant for anxiety Outpatient Providers:Dr. Plovosky for medication management.  Pt counseling about 25 years ago.   History of Psych Hospitalization: No  Psychological Testing: none   Abuse History:  Victim of: No., none   Report needed: No. Victim of Neglect:No. Perpetrator of none  Witness / Exposure to Domestic Violence: No   Protective Services Involvement: No  Witness to [employer] Violence:  No   Family History:  Family History  Problem Relation Age of Onset   Anxiety disorder Mother    Breast cancer Mother    Colon  cancer Father 39   Cancer Maternal Grandmother        unknown primary   Esophageal cancer Neg Hx    Stomach cancer Neg Hx   Pt grew up in [place].  Pt reports parents divorced and dad absent parent.  Pt reports had adopted brother, half sister and 2 step sisters.  Pt reports close to her step sister.    Living situation: the patient lives with her spouse in [place], [place].    Sexual Orientation: Straight  Relationship Status: married.  Pt married to her 2nd husband for 26 years.  Her first husband died from  cancer when she was 65 years old.   Name of spouse / other:Erica Green, MD If a parent, number of children / ages: Pt has 2 sons from her first husband.  Ages 26 and 36y/o. She has 3 grandchildren from her older son.  Her son is recovering addict. They live in [place].  She has 4 step children.  They did blend families.    Support Systems: spouse friends Industrial/product designer Stress:  No   Income/Employment/Disability: Product manager.  Pt hasn't worked since her first husband's death.  Life insurance allowed her to care for children and meet financial needs.  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    Military Service: No   Educational History: Education: Risk manager: Not reported  Any cultural differences that may affect / interfere with treatment:  not applicable   Recreation/Hobbies: Bridge and teaching.  Enjoys time w/ friends.   Stressors: Health problems   Other: Friends and wanting to support them    Strengths: Supportive Relationships, Family, Friends, and Able to Communicate Effectively  Barriers:  none   Legal History: Pending legal issue / charges: The patient has no significant history of legal issues. History of legal issue / charges: none  Medical History/Surgical History: reviewed Past Medical History:  Diagnosis Date   Anxiety    Anxiety disorder    BRCA2 positive 07/14/2012   Cancer (HCC) 2008   left breast. Right Breast Cancer in 2020   Dyslipidemia    Fall    Goiter    History of radiation therapy    Hypothyroidism    Osteoporosis     Past Surgical History:  Procedure Laterality Date   ABDOMINAL HYSTERECTOMY  2009   with BSO   BREAST LUMPECTOMY  [id number], 1991   BREAST LUMPECTOMY WITH RADIOACTIVE SEED LOCALIZATION Right 10/02/2018   Procedure: RIGHT BREAST LUMPECTOMY WITH RADIOACTIVE SEED LOCALIZATION;  Surgeon: Curvin Deward MOULD, MD;  Location: [facility];  Service: General;  Laterality: Right;   COLONOSCOPY N/A 12/13/2015   Procedure: COLONOSCOPY;  Surgeon: Gladis MARLA Louder, MD;  Location: [facility];  Service: Endoscopy;  Laterality: N/A;   COLONOSCOPY  2023   FRACTURE SURGERY  2015   left hip   HARDWARE REMOVAL Left 07/08/2015   Procedure: LEFT HIP HARDWARE REMOVAL;  Surgeon: Dempsey Moan, MD;  Location: [facility];  Service: Orthopedics;  Laterality: Left;   MASTECTOMY W/ SENTINEL NODE BIOPSY Left 08/22/2023   Procedure: MASTECTOMY WITH SENTINEL LYMPH NODE BIOPSY;  Surgeon: Ebbie Cough, MD;  Location: [facility];  Service: General;  Laterality: Left;  LMA w/ PEC BLOCK 150 MINUTES LEFT MASTECTOMY LEFT AXILLARY SENTINEL NODE BIOPSY MAGTRACE AND BLUE DYE INJECTION   PORTACATH PLACEMENT N/A 08/22/2023   Procedure: INSERTION, TUNNELED CENTRAL VENOUS DEVICE, WITH PORT;  Surgeon: Ebbie Cough,  MD;  Location: [facility];  Service: General;  Laterality: N/A;  PORT PLACEMENT WITH ULTRASOUND GUIDANCE   TOTAL MASTECTOMY Right 08/22/2023   Procedure: MASTECTOMY, SIMPLE;  Surgeon: Ebbie Cough, MD;  Location: [facility];  Service: General;  Laterality: Right;  RIGHT RISK REDUCING MASTECTOMY    Medications: Current Outpatient Medications  Medication Sig Dispense Refill   atorvastatin  (LIPITOR) 10 MG tablet Take 0.5 tablets (5 mg total) by mouth daily. 90 tablet 1   clorazepate (TRANXENE) 15 MG tablet Take 7.5-15 mg by mouth See admin instructions. Take 15 mg at bedtime, may take a 7.5 mg dose in the morning as needed anxiety     docusate sodium  (COLACE) 250 MG capsule Take 250 mg by mouth daily.     fluconazole  (DIFLUCAN ) 150 MG tablet Take 1 tablet (150 mg total) by mouth daily. 1 tablet 0   levothyroxine  (SYNTHROID ) 125 MCG tablet Take 1 tablet (125 mcg total) by mouth daily. Take 5 days a  week. (Patient taking differently: Take 125 mcg by mouth every Monday, Tuesday, Wednesday, Thursday, and Friday. Take 5 days a  week.) 90 tablet 1    lidocaine -prilocaine  (EMLA ) cream Apply to affected area once 30 g 3   methocarbamol  (ROBAXIN ) 500 MG tablet Take 1 tablet (500 mg total) by mouth every 8 (eight) hours as needed (use for muscle cramps/pain). 30 tablet 0   Multiple Vitamins-Minerals (LUTEIN-ZEAXANTHIN) TABS Take 1 tablet by mouth daily.     ondansetron  (ZOFRAN ) 8 MG tablet Take 1 tab (8 mg) by mouth every 8 hrs as needed for nausea/vomiting. Start third day after doxorubicin /cyclophosphamide  chemotherapy. 30 tablet 1   prochlorperazine  (COMPAZINE ) 10 MG tablet TAKE 1 TABLET(10 MG) BY MOUTH EVERY 6 HOURS AS NEEDED FOR NAUSEA OR VOMITING 30 tablet 1   traMADol (ULTRAM) 50 MG tablet Take 50 mg by mouth every 6 (six) hours as needed.     traZODone  (DESYREL ) 100 MG tablet Take 200 mg by mouth at bedtime.     VITAMIN D  PO Place 1 Dose under the tongue daily. drops     No current facility-administered medications for this visit.  Xanax 1mg  PRN daily prescribed.  Allergies  Allergen Reactions   Adhesive [Tape] Rash   Chloraprep One Step [Chlorhexidine  Gluconate] Itching    Skin tears   Latex Rash   Shellfish Allergy Anaphylaxis   Shellfish Protein-Containing Drug Products Anaphylaxis   Cephalexin Other (See Comments)    Unknown reaction    Ampicillin Rash   Betadine  [Povidone Iodine ] Rash   Macrodantin [Nitrofurantoin] Rash    Diagnoses:  Generalized anxiety disorder  Plan of Care: Pt is a 68y/o married female seeking counseling for anxiety as referred by her psychiatrist Dr. Dewanda.  Pt reports in tx w/ Dr. Dewanda for 30 years and had counseling 25 years ago.  Pt reports that was helpful to cope through death of first husband and her son's addiction issues.  Pt reports her anxiety has been helped w/ medication.  Pt reports that recognizing that struggling w/ increased rumination and worry of worst case scenario.  Pt recent return of breast cancer and currently in tx mastectomy and now chemotherapy. Pt reports major  stressor has been friends also struggling w/ cancer and how she tends to give/support others at her own expense.  Pt to f/u w/ weekly to biweekly counseling and to continue w/ Dr. Dewanda for medication management.    Individualized Treatment Plan Strengths: enjoys playing bridge and teaching bridge  Supports: support of  friends, husband, family.  [facility] group.     Goal/Needs for Treatment:  In order of importance to patient 1) improved coping w/ anxiety/decrease anxiety 2) --- 3) ---   Client Statement of Needs: Pt  I want to focus on the good things.  I want to be helpful to people w/out destroying my own psyche.     Treatment Level:outpatient counseling  Symptoms:anxiety/on edge, ruminating worry, castrophizing.  Client Treatment Preferences:  in person counseling every 1-2 weeks.  Continue medication management w/ Dr. Dewanda   Healthcare consumer's goal for treatment:  Counselor, Damien Herald, [facility] will support the patient's ability to achieve the goals identified. Cognitive Behavioral Therapy, Assertive Communication/Conflict Resolution Training, Relaxation Training, ACT, Humanistic and other evidenced-based practices will be used to promote progress towards healthy functioning.   Healthcare consumer will: Actively participate in therapy, working towards healthy functioning.    *Justification for Continuation/Discontinuation of Goal: R=Revised, O=Ongoing, A=Achieved, D=Discontinued  Goal 1) Increase effective coping skills to manage anxiety AEB pt increased use of mindfulness skills, reframing anxious thoughts/distortions per pt report and therapist observation. Baseline date 01/09/24: Progress towards goal 0; How Often - Daily Target Date Goal Was reviewed Status Code Progress towards goal/Likert rating  01/08/25                Goal 2) Increase awareness of balance between supporting friends and supporting her own self care AEB Pt report and therapist observation.    Baseline date 01/09/24: Progress towards goal 0; How Often - Daily Target Date Goal Was reviewed Status Code Progress towards goal  01/08/25                  This plan has been reviewed and created by the following participants:  This plan will be reviewed at least every 12 months. Date Behavioral Health Clinician Date Guardian/Patient   01/09/24  [facility] Herald [facility] 01/09/24 Verbal Consent Provided and electronic signature captured.                      HERALD DAMIEN, L[facility]

## 2024-01-09 NOTE — Progress Notes (Signed)
   Forde Radon, [facility]

## 2024-01-13 ENCOUNTER — Inpatient Hospital Stay

## 2024-01-13 ENCOUNTER — Encounter: Payer: Self-pay | Admitting: Hematology

## 2024-01-13 VITALS — BP 127/54 | HR 62 | Temp 98.3°F | Resp 16 | Wt 145.5 lb

## 2024-01-13 DIAGNOSIS — Z1502 Genetic susceptibility to malignant neoplasm of ovary: Secondary | ICD-10-CM

## 2024-01-13 DIAGNOSIS — Z5111 Encounter for antineoplastic chemotherapy: Secondary | ICD-10-CM | POA: Diagnosis not present

## 2024-01-13 DIAGNOSIS — E039 Hypothyroidism, unspecified: Secondary | ICD-10-CM

## 2024-01-13 LAB — CMP (CANCER CENTER ONLY)
ALT: 13 U/L (ref 0–44)
AST: 15 U/L (ref 15–41)
Albumin: 4.3 g/dL (ref 3.5–5.0)
Alkaline Phosphatase: 52 U/L (ref 38–126)
Anion gap: 4 — ABNORMAL LOW (ref 5–15)
BUN: 13 mg/dL (ref 8–23)
CO2: 28 mmol/L (ref 22–32)
Calcium: 9.7 mg/dL (ref 8.9–10.3)
Chloride: 107 mmol/L (ref 98–111)
Creatinine: 0.74 mg/dL (ref 0.44–1.00)
GFR, Estimated: >60 mL/min (ref >60)
Glucose, Bld: 91 mg/dL (ref 70–99)
Potassium: 3.8 mmol/L (ref 3.5–5.1)
Sodium: 139 mmol/L (ref 135–145)
Total Bilirubin: 0.5 mg/dL (ref 0.0–1.2)
Total Protein: 6.6 g/dL (ref 6.5–8.1)

## 2024-01-13 LAB — CBC WITH DIFFERENTIAL (CANCER CENTER ONLY)
Abs Immature Granulocytes: 0.03 K/uL (ref 0.00–0.07)
Basophils Absolute: 0 K/uL (ref 0.0–0.1)
Basophils Relative: 1 %
Eosinophils Absolute: 0.2 K/uL (ref 0.0–0.5)
Eosinophils Relative: 4 %
HCT: 29.2 % — ABNORMAL LOW (ref 36.0–46.0)
Hemoglobin: 9.9 g/dL — ABNORMAL LOW (ref 12.0–15.0)
Immature Granulocytes: 1 %
Lymphocytes Relative: 18 %
Lymphs Abs: 0.7 K/uL (ref 0.7–4.0)
MCH: 30.9 pg (ref 26.0–34.0)
MCHC: 33.9 g/dL (ref 30.0–36.0)
MCV: 91.3 fL (ref 80.0–100.0)
Monocytes Absolute: 0.3 K/uL (ref 0.1–1.0)
Monocytes Relative: 7 %
Neutro Abs: 2.6 K/uL (ref 1.7–7.7)
Neutrophils Relative %: 69 %
Platelet Count: 282 K/uL (ref 150–400)
RBC: 3.2 MIL/uL — ABNORMAL LOW (ref 3.87–5.11)
RDW: 14.1 % (ref 11.5–15.5)
WBC Count: 3.7 K/uL — ABNORMAL LOW (ref 4.0–10.5)
nRBC: 0 % (ref 0.0–0.2)

## 2024-01-13 LAB — TSH: TSH: 4.82 u[IU]/mL — ABNORMAL HIGH (ref 0.350–4.500)

## 2024-01-13 LAB — T4, FREE: Free T4: 0.73 ng/dL (ref 0.61–1.12)

## 2024-01-13 MED ORDER — FAMOTIDINE IN NACL 20-0.9 MG/50ML-% IV SOLN
20.0000 mg | Freq: Once | INTRAVENOUS | Status: AC
  Administered 2024-01-13: 20 mg via INTRAVENOUS
  Filled 2024-01-13: qty 50

## 2024-01-13 MED ORDER — SODIUM CHLORIDE 0.9 % IV SOLN
80.0000 mg/m2 | Freq: Once | INTRAVENOUS | Status: AC
  Administered 2024-01-13: 138 mg via INTRAVENOUS
  Filled 2024-01-13: qty 23

## 2024-01-13 MED ORDER — SODIUM CHLORIDE 0.9 % IV SOLN: INTRAVENOUS | Status: DC

## 2024-01-13 MED ORDER — CETIRIZINE HCL 10 MG/ML IV SOLN
10.0000 mg | Freq: Once | INTRAVENOUS | Status: AC
  Administered 2024-01-13: 10 mg via INTRAVENOUS
  Filled 2024-01-13: qty 1

## 2024-01-13 MED ORDER — DEXAMETHASONE SOD PHOSPHATE PF 10 MG/ML IJ SOLN
10.0000 mg | Freq: Once | INTRAMUSCULAR | Status: AC
  Administered 2024-01-13: 10 mg via INTRAVENOUS

## 2024-01-13 NOTE — Progress Notes (Signed)
 1322 Pt did not get her Tempus kit drawn she did not feel informed enough by nurse/provider. CMA Norleen was informed about the situation he spoke to pt about the Tempus kit pt. CMA John came to an agreement to get the tempus kit drawn on Monday after discussing it with her provider.

## 2024-01-13 NOTE — Patient Instructions (Signed)
 [facility] - A DEPT OF [facility]   Discharge Instructions: Thank you for choosing [facility] to provide your oncology and hematology care.   If you have a lab appointment with the Cancer Center, please go directly to the Cancer Center and check in at the registration area.   Wear comfortable clothing and clothing appropriate for easy access to any Portacath or PICC line.   We strive to give you quality time with your provider. You may need to reschedule your appointment if you arrive late (15 or more minutes).  Arriving late affects you and other patients whose appointments are after yours.  Also, if you miss three or more appointments without notifying the office, you may be dismissed from the clinic at the provider's discretion.      For prescription refill requests, have your pharmacy contact our office and allow 72 hours for refills to be completed.    Today you received the following chemotherapy and/or immunotherapy agents: Paclitaxel (Taxol)      To help prevent nausea and vomiting after your treatment, we encourage you to take your nausea medication as directed.  BELOW ARE SYMPTOMS THAT SHOULD BE REPORTED IMMEDIATELY: *FEVER GREATER THAN 100.4 F (38 C) OR HIGHER *CHILLS OR SWEATING *NAUSEA AND VOMITING THAT IS NOT CONTROLLED WITH YOUR NAUSEA MEDICATION *UNUSUAL SHORTNESS OF BREATH *UNUSUAL BRUISING OR BLEEDING *URINARY PROBLEMS (pain or burning when urinating, or frequent urination) *BOWEL PROBLEMS (unusual diarrhea, constipation, pain near the anus) TENDERNESS IN MOUTH AND THROAT WITH OR WITHOUT PRESENCE OF ULCERS (sore throat, sores in mouth, or a toothache) UNUSUAL RASH, SWELLING OR PAIN  UNUSUAL VAGINAL DISCHARGE OR ITCHING   Items with * indicate a potential emergency and should be followed up as soon as possible or go to the Emergency Department if any problems should occur.  Please show the CHEMOTHERAPY ALERT CARD or  IMMUNOTHERAPY ALERT CARD at check-in to the Emergency Department and triage nurse.  Should you have questions after your visit or need to cancel or reschedule your appointment, please contact [facility] - A DEPT OF [facility]  Dept: [phone]  and follow the prompts.  Office hours are 8:00 a.m. to 4:30 p.m. Monday - Friday. Please note that voicemails left after 4:00 p.m. may not be returned until the following business day.  We are closed weekends and major holidays. You have access to a nurse at all times for urgent questions. Please call the main number to the clinic Dept: [phone] and follow the prompts.   For any non-urgent questions, you may also contact your provider using MyChart. We now offer e-Visits for anyone 76 and older to request care online for non-urgent symptoms. For details visit [email or web address].   Also download the MyChart app! Go to the app store, search "MyChart", open the app, select [place], and log in with your MyChart username and password.

## 2024-01-16 ENCOUNTER — Ambulatory Visit: Payer: Self-pay | Admitting: Nurse Practitioner

## 2024-01-17 ENCOUNTER — Other Ambulatory Visit: Payer: Self-pay

## 2024-01-17 DIAGNOSIS — C50111 Malignant neoplasm of central portion of right female breast: Secondary | ICD-10-CM

## 2024-01-17 DIAGNOSIS — C50112 Malignant neoplasm of central portion of left female breast: Secondary | ICD-10-CM

## 2024-01-17 DIAGNOSIS — Z15068 Genetic susceptibility to other malignant neoplasm of digestive system: Secondary | ICD-10-CM

## 2024-01-17 DIAGNOSIS — Z15060 Genetic susceptibility to colorectal cancer: Secondary | ICD-10-CM

## 2024-01-17 DIAGNOSIS — C50412 Malignant neoplasm of upper-outer quadrant of left female breast: Secondary | ICD-10-CM

## 2024-01-20 ENCOUNTER — Inpatient Hospital Stay

## 2024-01-20 ENCOUNTER — Inpatient Hospital Stay (HOSPITAL_BASED_OUTPATIENT_CLINIC_OR_DEPARTMENT_OTHER): Admitting: Nurse Practitioner

## 2024-01-20 ENCOUNTER — Encounter: Payer: Self-pay | Admitting: Nurse Practitioner

## 2024-01-20 ENCOUNTER — Other Ambulatory Visit: Payer: Self-pay

## 2024-01-20 VITALS — BP 138/70 | HR 63 | Temp 97.8°F | Resp 17 | Wt 147.3 lb

## 2024-01-20 DIAGNOSIS — Z1501 Genetic susceptibility to malignant neoplasm of breast: Secondary | ICD-10-CM

## 2024-01-20 DIAGNOSIS — Z1505 Genetic susceptibility to other malignant neoplasm of digestive system: Secondary | ICD-10-CM

## 2024-01-20 DIAGNOSIS — Z15068 Genetic susceptibility to other malignant neoplasm of digestive system: Secondary | ICD-10-CM

## 2024-01-20 DIAGNOSIS — Z1502 Genetic susceptibility to malignant neoplasm of ovary: Secondary | ICD-10-CM

## 2024-01-20 DIAGNOSIS — Z1509 Genetic susceptibility to other malignant neoplasm: Secondary | ICD-10-CM | POA: Diagnosis not present

## 2024-01-20 DIAGNOSIS — Z1507 Genetic susceptibility to malignant neoplasm of urinary tract: Secondary | ICD-10-CM

## 2024-01-20 DIAGNOSIS — C50912 Malignant neoplasm of unspecified site of left female breast: Secondary | ICD-10-CM

## 2024-01-20 DIAGNOSIS — Z5111 Encounter for antineoplastic chemotherapy: Secondary | ICD-10-CM | POA: Diagnosis not present

## 2024-01-20 DIAGNOSIS — Z15060 Genetic susceptibility to colorectal cancer: Secondary | ICD-10-CM

## 2024-01-20 LAB — CBC WITH DIFFERENTIAL (CANCER CENTER ONLY)
Abs Immature Granulocytes: 0.06 K/uL (ref 0.00–0.07)
Basophils Absolute: 0.1 K/uL (ref 0.0–0.1)
Basophils Relative: 2 %
Eosinophils Absolute: 0.2 K/uL (ref 0.0–0.5)
Eosinophils Relative: 5 %
HCT: 30.9 % — ABNORMAL LOW (ref 36.0–46.0)
Hemoglobin: 10.6 g/dL — ABNORMAL LOW (ref 12.0–15.0)
Immature Granulocytes: 1 %
Lymphocytes Relative: 19 %
Lymphs Abs: 0.8 K/uL (ref 0.7–4.0)
MCH: 31.5 pg (ref 26.0–34.0)
MCHC: 34.3 g/dL (ref 30.0–36.0)
MCV: 91.7 fL (ref 80.0–100.0)
Monocytes Absolute: 0.3 K/uL (ref 0.1–1.0)
Monocytes Relative: 6 %
Neutro Abs: 2.9 K/uL (ref 1.7–7.7)
Neutrophils Relative %: 67 %
Platelet Count: 290 K/uL (ref 150–400)
RBC: 3.37 MIL/uL — ABNORMAL LOW (ref 3.87–5.11)
RDW: 13.4 % (ref 11.5–15.5)
WBC Count: 4.4 K/uL (ref 4.0–10.5)
nRBC: 0 % (ref 0.0–0.2)

## 2024-01-20 LAB — CMP (CANCER CENTER ONLY)
ALT: 17 U/L (ref 0–44)
AST: 16 U/L (ref 15–41)
Albumin: 4.3 g/dL (ref 3.5–5.0)
Alkaline Phosphatase: 54 U/L (ref 38–126)
Anion gap: 6 (ref 5–15)
BUN: 13 mg/dL (ref 8–23)
CO2: 27 mmol/L (ref 22–32)
Calcium: 9.9 mg/dL (ref 8.9–10.3)
Chloride: 103 mmol/L (ref 98–111)
Creatinine: 0.8 mg/dL (ref 0.44–1.00)
GFR, Estimated: >60 mL/min (ref >60)
Glucose, Bld: 96 mg/dL (ref 70–99)
Potassium: 4.1 mmol/L (ref 3.5–5.1)
Sodium: 136 mmol/L (ref 135–145)
Total Bilirubin: 0.5 mg/dL (ref 0.0–1.2)
Total Protein: 6.8 g/dL (ref 6.5–8.1)

## 2024-01-20 MED ORDER — CETIRIZINE HCL 10 MG/ML IV SOLN
10.0000 mg | Freq: Once | INTRAVENOUS | Status: AC
  Administered 2024-01-20: 10 mg via INTRAVENOUS
  Filled 2024-01-20: qty 1

## 2024-01-20 MED ORDER — SODIUM CHLORIDE 0.9 % IV SOLN
80.0000 mg/m2 | Freq: Once | INTRAVENOUS | Status: AC
  Administered 2024-01-20: 138 mg via INTRAVENOUS
  Filled 2024-01-20: qty 23

## 2024-01-20 MED ORDER — DEXAMETHASONE SOD PHOSPHATE PF 10 MG/ML IJ SOLN
10.0000 mg | Freq: Once | INTRAMUSCULAR | Status: AC
  Administered 2024-01-20: 10 mg via INTRAVENOUS

## 2024-01-20 MED ORDER — TRAMADOL HCL 50 MG PO TABS: 50.0000 mg | ORAL_TABLET | Freq: Four times a day (QID) | ORAL | 0 refills | Status: AC | PRN

## 2024-01-20 MED ORDER — SODIUM CHLORIDE 0.9 % IV SOLN: INTRAVENOUS | Status: DC

## 2024-01-20 MED ORDER — FAMOTIDINE IN NACL 20-0.9 MG/50ML-% IV SOLN
20.0000 mg | Freq: Once | INTRAVENOUS | Status: AC
  Administered 2024-01-20: 20 mg via INTRAVENOUS
  Filled 2024-01-20: qty 50

## 2024-01-20 NOTE — Patient Instructions (Signed)
 [facility] - A DEPT OF [facility]   Discharge Instructions: Thank you for choosing [facility] to provide your oncology and hematology care.   If you have a lab appointment with the Cancer Center, please go directly to the Cancer Center and check in at the registration area.   Wear comfortable clothing and clothing appropriate for easy access to any Portacath or PICC line.   We strive to give you quality time with your provider. You may need to reschedule your appointment if you arrive late (15 or more minutes).  Arriving late affects you and other patients whose appointments are after yours.  Also, if you miss three or more appointments without notifying the office, you may be dismissed from the clinic at the provider's discretion.      For prescription refill requests, have your pharmacy contact our office and allow 72 hours for refills to be completed.    Today you received the following chemotherapy and/or immunotherapy agents: Paclitaxel (Taxol)      To help prevent nausea and vomiting after your treatment, we encourage you to take your nausea medication as directed.  BELOW ARE SYMPTOMS THAT SHOULD BE REPORTED IMMEDIATELY: *FEVER GREATER THAN 100.4 F (38 C) OR HIGHER *CHILLS OR SWEATING *NAUSEA AND VOMITING THAT IS NOT CONTROLLED WITH YOUR NAUSEA MEDICATION *UNUSUAL SHORTNESS OF BREATH *UNUSUAL BRUISING OR BLEEDING *URINARY PROBLEMS (pain or burning when urinating, or frequent urination) *BOWEL PROBLEMS (unusual diarrhea, constipation, pain near the anus) TENDERNESS IN MOUTH AND THROAT WITH OR WITHOUT PRESENCE OF ULCERS (sore throat, sores in mouth, or a toothache) UNUSUAL RASH, SWELLING OR PAIN  UNUSUAL VAGINAL DISCHARGE OR ITCHING   Items with * indicate a potential emergency and should be followed up as soon as possible or go to the Emergency Department if any problems should occur.  Please show the CHEMOTHERAPY ALERT CARD or  IMMUNOTHERAPY ALERT CARD at check-in to the Emergency Department and triage nurse.  Should you have questions after your visit or need to cancel or reschedule your appointment, please contact [facility] - A DEPT OF [facility]  Dept: [phone]  and follow the prompts.  Office hours are 8:00 a.m. to 4:30 p.m. Monday - Friday. Please note that voicemails left after 4:00 p.m. may not be returned until the following business day.  We are closed weekends and major holidays. You have access to a nurse at all times for urgent questions. Please call the main number to the clinic Dept: [phone] and follow the prompts.   For any non-urgent questions, you may also contact your provider using MyChart. We now offer e-Visits for anyone 76 and older to request care online for non-urgent symptoms. For details visit [email or web address].   Also download the MyChart app! Go to the app store, search "MyChart", open the app, select [place], and log in with your MyChart username and password.

## 2024-01-20 NOTE — Progress Notes (Signed)
 [facility]   [phone] [phone]    Patient Care Team: Geofm Glade PARAS, MD as PCP - General (Internal Medicine) Tasia Lung, MD as Consulting Physician (Psychiatry) Melodi Lerner, MD as Consulting Physician (Orthopedic Surgery) Barbette Knock, MD as Consulting Physician (Obstetrics and Gynecology) Shannon Agent, MD as Consulting Physician (Radiation Oncology) Lanny Callander, MD as Consulting Physician (Hematology) [facility] as Consulting Physician (Radiology) Lita Lye, OD as Referring Physician (Optometry) Ebbie Cough, MD as Consulting Physician (General Surgery) Tyree Nanetta SAILOR, RN as Oncology Nurse Navigator   CHIEF COMPLAINT: Follow-up BRCA+ triple negative left breast cancer  Oncology History  Breast cancer, BRCA2 positive, left (HCC)  04/15/2017 Initial Diagnosis   Breast cancer, BRCA2 positive, left (HCC)   09/30/2023 -  Chemotherapy   Patient is on Treatment Plan : BREAST DOSE DENSE AC q14d / PACLitaxel  q7d     Malignant neoplasm of central portion of right breast in female, estrogen receptor positive (HCC)  09/12/2018 Initial Diagnosis   Ductal carcinoma in situ (DCIS) of right breast   09/17/2018 Cancer Staging   Staging form: Breast, AJCC 8th Edition - Clinical: Stage 0 (cTis (DCIS), cN0, cM0, ER+, PR+, HER2: Not Assessed) - Signed by Crawford Morna Pickle, NP on 09/17/2018   10/02/2018 Cancer Staging   Staging form: Breast, AJCC 8th Edition - Pathologic stage from 10/02/2018: Stage IA (pT1b, pN0, cM0, G2, ER+, PR+, HER2-) - Signed by Crawford Morna Pickle, NP on 10/15/2018   Malignant neoplasm of central portion of left breast in female, estrogen receptor negative (HCC)  07/15/2023 Cancer Staging   Staging form: Breast, AJCC 8th Edition - Clinical stage from 07/15/2023: Stage IB (cT1b, cN0, cM0, G2, ER-, PR-, HER2-) - Signed by Lanny Callander, MD on 07/17/2023 Stage prefix: Initial  diagnosis Histologic grading system: 3 grade system   07/17/2023 Initial Diagnosis   Malignant neoplasm of central portion of left breast in female, estrogen receptor negative (HCC)   08/22/2023 Cancer Staging   Staging form: Breast, AJCC 8th Edition - Pathologic stage from 08/22/2023: Stage IIA (pT2, pN0, cM0, G3, ER-, PR-, HER2-) - Signed by Lanny Callander, MD on 12/09/2023 Histologic grading system: 3 grade system Residual tumor (R): R0      CURRENT THERAPY: Adjuvant chemo, AC-T  INTERVAL HISTORY [name] returns for follow-up and treatment as scheduled, she has completed AC and 8 cycles of weekly Taxol , doing very well. Feeling more tired lately, but still up and mobile/active. Hip pain has been intermittent since fracture, got worse recently so they got worried and xray was done, negative. Pain improved so she went for a walk this weekend.  Denies mucus this, rash, n/v/c/d, neuropathy.  ROS  All other systems reviewed and negative   Past Medical History:  Diagnosis Date   Anxiety    Anxiety disorder    BRCA2 positive 07/14/2012   Cancer (HCC) 2008   left breast. Right Breast Cancer in 2020   Dyslipidemia    Fall    Goiter    History of radiation therapy    Hypothyroidism    Osteoporosis      Past Surgical History:  Procedure Laterality Date   ABDOMINAL HYSTERECTOMY  2009   with BSO   BREAST LUMPECTOMY  [id number],[id number], 1991   BREAST LUMPECTOMY WITH RADIOACTIVE SEED LOCALIZATION Right 10/02/2018   Procedure: RIGHT BREAST LUMPECTOMY WITH RADIOACTIVE SEED LOCALIZATION;  Surgeon: Curvin Deward MOULD, MD;  Location: [facility];  Service:  General;  Laterality: Right;   COLONOSCOPY N/A 12/13/2015   Procedure: COLONOSCOPY;  Surgeon: Gladis MARLA Louder, MD;  Location: [facility];  Service: Endoscopy;  Laterality: N/A;   COLONOSCOPY  2023   FRACTURE SURGERY  2015   left hip   HARDWARE REMOVAL Left 07/08/2015   Procedure: LEFT HIP HARDWARE REMOVAL;  Surgeon: Dempsey Moan, MD;   Location: [facility];  Service: Orthopedics;  Laterality: Left;   MASTECTOMY W/ SENTINEL NODE BIOPSY Left 08/22/2023   Procedure: MASTECTOMY WITH SENTINEL LYMPH NODE BIOPSY;  Surgeon: Ebbie Cough, MD;  Location: [facility];  Service: General;  Laterality: Left;  LMA w/ PEC BLOCK 150 MINUTES LEFT MASTECTOMY LEFT AXILLARY SENTINEL NODE BIOPSY MAGTRACE AND BLUE DYE INJECTION   PORTACATH PLACEMENT N/A 08/22/2023   Procedure: INSERTION, TUNNELED CENTRAL VENOUS DEVICE, WITH PORT;  Surgeon: Ebbie Cough, MD;  Location: [facility];  Service: General;  Laterality: N/A;  PORT PLACEMENT WITH ULTRASOUND GUIDANCE   TOTAL MASTECTOMY Right 08/22/2023   Procedure: MASTECTOMY, SIMPLE;  Surgeon: Ebbie Cough, MD;  Location: [facility];  Service: General;  Laterality: Right;  RIGHT RISK REDUCING MASTECTOMY     Outpatient Encounter Medications as of 01/20/2024  Medication Sig   atorvastatin  (LIPITOR) 10 MG tablet Take 0.5 tablets (5 mg total) by mouth daily.   clorazepate (TRANXENE) 15 MG tablet Take 7.5-15 mg by mouth See admin instructions. Take 15 mg at bedtime, may take a 7.5 mg dose in the morning as needed anxiety   docusate sodium  (COLACE) 250 MG capsule Take 250 mg by mouth daily.   fluconazole  (DIFLUCAN ) 150 MG tablet Take 1 tablet (150 mg total) by mouth daily.   levothyroxine  (SYNTHROID ) 125 MCG tablet Take 1 tablet (125 mcg total) by mouth daily. Take 5 days a  week. (Patient taking differently: Take 125 mcg by mouth every Monday, Tuesday, Wednesday, Thursday, and Friday. Take 5 days a  week.)   lidocaine -prilocaine  (EMLA ) cream Apply to affected area once   methocarbamol  (ROBAXIN ) 500 MG tablet Take 1 tablet (500 mg total) by mouth every 8 (eight) hours as needed (use for muscle cramps/pain).   Multiple Vitamins-Minerals (LUTEIN-ZEAXANTHIN) TABS Take 1 tablet by mouth daily.   ondansetron  (ZOFRAN ) 8 MG tablet Take 1 tab (8 mg) by mouth every 8 hrs as needed for nausea/vomiting. Start third day after  doxorubicin /cyclophosphamide  chemotherapy.   prochlorperazine  (COMPAZINE ) 10 MG tablet TAKE 1 TABLET(10 MG) BY MOUTH EVERY 6 HOURS AS NEEDED FOR NAUSEA OR VOMITING   traMADol (ULTRAM) 50 MG tablet Take 1 tablet (50 mg total) by mouth every 6 (six) hours as needed.   traZODone  (DESYREL ) 100 MG tablet Take 200 mg by mouth at bedtime.   VITAMIN D  PO Place 1 Dose under the tongue daily. drops   [DISCONTINUED] traMADol (ULTRAM) 50 MG tablet Take 50 mg by mouth every 6 (six) hours as needed.   No facility-administered encounter medications on file as of 01/20/2024.     Today's Vitals   01/20/24 1151  BP: 138/70  Pulse: 63  Resp: 17  Temp: 97.8 F (36.6 C)  SpO2: 99%  Weight: 147 lb 4.8 oz (66.8 kg)   Body mass index is 25.28 kg/m.   ECOG PERFORMANCE STATUS: 1 - Symptomatic but completely ambulatory  PHYSICAL EXAM GENERAL:alert, no distress and comfortable SKIN: no rash  HEENT sclera clear.  No thrush or ulcers LUNGS: clear with normal breathing effort HEART: regular rate & rhythm, no lower extremity edema ABDOMEN: abdomen soft, non-tender and normal bowel sounds  NEURO: alert & oriented x 3 with fluent speech, no focal motor/sensory deficits Breast exam: Deferred PAC without erythema    CBC    Latest Ref Rng & Units 01/20/2024   11:24 AM 01/13/2024   12:53 PM 01/06/2024    9:53 AM  CBC  WBC 4.0 - 10.5 K/uL 4.4  3.7  3.8   Hemoglobin 12.0 - 15.0 g/dL 89.3  9.9  89.9   Hematocrit 36.0 - 46.0 % 30.9  29.2  29.9   Platelets 150 - 400 K/uL 290  282  279       CMP     Latest Ref Rng & Units 01/20/2024   11:24 AM 01/13/2024   12:53 PM 01/06/2024    9:53 AM  CMP  Glucose 70 - 99 mg/dL 96  91  91   BUN 8 - 23 mg/dL 13  13  11    Creatinine 0.44 - 1.00 mg/dL 9.19  9.25  9.09   Sodium 135 - 145 mmol/L 136  139  139   Potassium 3.5 - 5.1 mmol/L 4.1  3.8  3.9   Chloride 98 - 111 mmol/L 103  107  105   CO2 22 - 32 mmol/L 27  28  28    Calcium  8.9 - 10.3 mg/dL 9.9  9.7  9.7    Total Protein 6.5 - 8.1 g/dL 6.8  6.6  6.5   Total Bilirubin 0.0 - 1.2 mg/dL 0.5  0.5  0.3   Alkaline Phos 38 - 126 U/L 54  52  58   AST 15 - 41 U/L 16  15  12    ALT 0 - 44 U/L 17  13  13        ASSESSMENT & PLAN:68 year old post hysterectomy female with    Malignant neoplasm of central portion of left breast in female, estrogen receptor negative, Ki67 80% -cT1bN0M0, stage IB, ER-/PR-/HER2- -breast MRI 09/11/22 detected a non-mass enhancement in L breast but it was felt to be a blood vessel when biopsy was attempted.  A close follow-up breast MRI 12/14/22 showed persistent linear non mass enhancement in the lateral posterior left breast, bx was benign and concordant.  -breast MRI 06/21/2023 showed a 1.0 cm of non mass enhancement/possible enhancing mass with progressive kinetics which is suspicious for malignancy, biopsy showed grade 2 invasive ductal carcinoma, triple negative.  The area measures 0.8 cm on ultrasound. - S/p port placement and bilateral mastectomy with L SLNB Aug 22, 2023 [name]). Surgical path showed a grade 3, 2.8 cm triple negative left breast adeno carcinoma, negative margins, all 6 lymph nodes were negative. -Staging CT CAP/bone scan negative, baseline echo is normal - Postop course complicated by cellulitis (R mastectomy site/flap) and open port incision, healing. Cleared to begin chemo by Dr. Ebbie first cycle 09/30/23 -Ms. Mullins appears stable, s/p AC and 8 cycles of weekly Taxol , tolerating very well with fatigue, no other appreciable side effects.  Able to recover and function well with excellent performance status.  There is no clinical evidence of recurrence -There was some confusion over [employer] testing, tumor profile/molecular testing is not indicated at this time.  We did discuss Signatera MRD testing q. 3 months as a surveillance stool. She anticipates high degree of anxiety with this test, wants to get through chemo and think about it which I support.  -Labs  reviewed, adequate to proceed with cycle 9 Taxol  today, no dose adjustments -F/up and cycle 10 next week as scheduled  Left hip pain -  Since previous hip fracture and surgery -Had pain recurrence after breast surgery which resolved, and recurred during taxol  chemo. We discussed common body/bone aches with taxol  -Xray negative, hardware in place, no fracture or acute abnormality -Pain improved. Went for walk this weekend.  -Refill tramadol PRN   Malignant neoplasm of central portion of right breast, stage 1, pT1b, cN0 IDC, grade 2 -Initially diagnosed with DCIS 09/10/18. Lumpectomy 10/02/18 showed invasive ductal carcinoma. S/p radiation 11/06/18 - 12/25/18.  -Took exemestane  01/2019 - 03/2021, discontinued due to osteoporosis and small benefit. -Mammo 03/2022 was benign, breast MRI 09/11/22 detected a L breast non mass enhancement and incidental opacity in the R lung.  -Follow up CXR image is clear to me, report notes the MRI finding is not well  visualized. She has no respiratory symptoms, non smoker. We decided to hold on CT chest.  -She went back for MRI guided Bx of L br on 09/14/22 but the area appeared more like a vessel, and bx was not done. Short term MRI 12/14/22 showed persistent linear non mass enhancement in the lateral posterior left breast, bx was benign and concordant.   H/o Left breast stage T2 N0 IDC, ER+/PR+/Her2-  -s/p lumpectomy 03/2007. Oncotype RS 20, low risk.  -Took antiestrogens from 07/2007 - 09/2018 (Arimidex , tamoxifen , and raloxifene ).   BRCA2+ -found to be BRCA2+ at time of first breast cancer. Bilateral mastectomies not felt to be necessary at that time, but she did undergo TAH w/ BSO 10/2007. -She has adult 2 sons, both tested positive and followed by oncologists where they live -We continue to discuss pancreatic screening and I previously discussed with her GI Dr. Legrand.  -A maternal first cousin who was BRCA+ had pancreatic cancer.  -Guidelines/data are not definitive  but recommends screening if BRCA2+ with 1 first degree relative or 2 non-first degree relatives -CT CAP for breast cancer staging showed normal pancreas   Osteoporosis -most recent DEXA 03/23/21 showed T-score -2.9 at right hip. Though density in lumbar spine worsened some, her hip slightly improved from -3.1 in 03/2020. -she was previously on fosamax for many years then prolia injections for one year.  Currently on a treatment holiday -follows with Dr. Melodye at [facility].    PLAN: -Reviewed recent hip xray, negative. Continue supportive care for intermittent left hip pain, refill tramadol -Labs reviewed, adequate to proceed with cycle 10 Taxol , no dose adjustments -Follow up and cycle 11 next week, or sooner if needed -Discussed Signatera MRD surveillance testing, pt undecided, will hold for now    All questions were answered. The patient knows to call the clinic with any problems, questions or concerns. No barriers to learning were detected.   Tinisha Etzkorn K Inigo Lantigua, NP 01/20/2024

## 2024-01-23 ENCOUNTER — Ambulatory Visit (INDEPENDENT_AMBULATORY_CARE_PROVIDER_SITE_OTHER): Admitting: Psychology

## 2024-01-23 DIAGNOSIS — F411 Generalized anxiety disorder: Secondary | ICD-10-CM

## 2024-01-23 NOTE — Progress Notes (Deleted)
   Forde Radon, [facility]

## 2024-01-23 NOTE — Progress Notes (Signed)
 [facility] Counselor/Therapist Progress Note  Patient ID: [name], MRN: [id number],    Date: 01/23/2024  Time Spent: 10:00am-10:53am  Pt is seen for in person visit.   Treatment Type: Individual Therapy  Reported Symptoms: anxiety, rumination   Mental Status Exam: Appearance:  Well Groomed     Behavior: Appropriate  Motor: Normal  Speech/Language:  Clear and Coherent and Normal Rate  Affect: Appropriate  Mood: anxious  Thought process: normal  Thought content:   WNL  Sensory/Perceptual disturbances:   WNL  Orientation: oriented to person, place, time/date, and situation  Attention: Good  Concentration: Good  Memory: WNL  Fund of knowledge:  Good  Insight:   Good  Judgment:  Good  Impulse Control: Good   Risk Assessment: Danger to Self:  No Self-injurious Behavior: No Danger to Others: No Duty to Warn:no Physical Aggression / Violence:No  Access to Firearms a concern: No  Gang Involvement:No   Subjective: counselor assessed pt current functioning per pt report. Processed w/ pt anxiety and worried thoughts.  Assisted w/ identifying distortions, challenging.  Discussed mindfulness and leaves on river practice.  Had pt identify what thought/mantra can use to be intentional to shift from ruminating worry. Pt affect wnl.  Pt reports that struggling w/ anxiety/worry and rumination about her cancer, testing, 'right course to take'.  Pt identified that wakes in the morning w/ this on her mind and difficulty shifting.  Pt receptive to mindful practice and beginning to try the practice for self.     Interventions: Cognitive Behavioral Therapy and Mindfulness Meditation  Diagnosis:Generalized anxiety disorder  Plan: Pt to f/u weekly/biweekly w/ counseling.  Pt to f/u as scheduled w/ Psychiatrist.  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  [facility] group.      Goal/Needs for Treatment:  In order of importance to patient 1) improved coping w/ anxiety/decrease anxiety 2) --- 3) ---   Client Statement of Needs: Pt  I want to focus on the good things.  I want to be helpful to people w/out destroying my own psyche.     Treatment Level:outpatient counseling  Symptoms:anxiety/on edge, ruminating worry, castrophizing.  Client Treatment Preferences:  in person counseling every 1-2 weeks.  Continue medication management w/ Dr. Dewanda   Healthcare consumer's goal for treatment:  Counselor, [name], [facility] will support the patient's ability to achieve the goals identified. Cognitive Behavioral Therapy, Assertive Communication/Conflict Resolution Training, Relaxation Training, ACT, Humanistic and other evidenced-based practices will be used to promote progress towards healthy functioning.   Healthcare consumer will: Actively participate in therapy, working towards healthy functioning.    *Justification for Continuation/Discontinuation of Goal: R=Revised, O=Ongoing, A=Achieved, D=Discontinued  Goal 1) Increase effective coping skills to manage anxiety AEB pt increased use of mindfulness skills, reframing anxious thoughts/distortions per pt report and therapist observation. Baseline date 01/09/24: Progress towards goal 0; How Often - Daily Target Date Goal Was reviewed Status Code Progress towards goal/Likert rating  01/08/25                Goal 2) Increase awareness of balance between supporting friends and supporting her own self care AEB Pt report and therapist observation.   Baseline date 01/09/24: Progress towards goal 0; How Often - Daily Target Date Goal Was reviewed Status Code Progress towards goal  01/08/25                  This plan has been reviewed  and created by the following participants:  This plan will be reviewed at least every 12 months. Date Behavioral Health Clinician Date Guardian/Patient   01/09/24  [facility] Barbarann  [facility] 01/09/24 Verbal Consent Provided and electronic signature captured.          BARBARANN APPL, [facility]

## 2024-01-27 ENCOUNTER — Inpatient Hospital Stay

## 2024-01-27 VITALS — BP 123/71 | HR 68 | Temp 97.8°F | Resp 16 | Wt 147.8 lb

## 2024-01-27 DIAGNOSIS — Z1507 Genetic susceptibility to malignant neoplasm of urinary tract: Secondary | ICD-10-CM

## 2024-01-27 DIAGNOSIS — Z5111 Encounter for antineoplastic chemotherapy: Secondary | ICD-10-CM | POA: Diagnosis not present

## 2024-01-27 LAB — CBC WITH DIFFERENTIAL (CANCER CENTER ONLY)
Abs Immature Granulocytes: 0.06 K/uL (ref 0.00–0.07)
Basophils Absolute: 0.1 K/uL (ref 0.0–0.1)
Basophils Relative: 2 %
Eosinophils Absolute: 0.2 K/uL (ref 0.0–0.5)
Eosinophils Relative: 7 %
HCT: 30 % — ABNORMAL LOW (ref 36.0–46.0)
Hemoglobin: 10.1 g/dL — ABNORMAL LOW (ref 12.0–15.0)
Immature Granulocytes: 2 %
Lymphocytes Relative: 21 %
Lymphs Abs: 0.7 K/uL (ref 0.7–4.0)
MCH: 31.2 pg (ref 26.0–34.0)
MCHC: 33.7 g/dL (ref 30.0–36.0)
MCV: 92.6 fL (ref 80.0–100.0)
Monocytes Absolute: 0.3 K/uL (ref 0.1–1.0)
Monocytes Relative: 9 %
Neutro Abs: 2 K/uL (ref 1.7–7.7)
Neutrophils Relative %: 59 %
Platelet Count: 254 K/uL (ref 150–400)
RBC: 3.24 MIL/uL — ABNORMAL LOW (ref 3.87–5.11)
RDW: 13.4 % (ref 11.5–15.5)
WBC Count: 3.4 K/uL — ABNORMAL LOW (ref 4.0–10.5)
nRBC: 0 % (ref 0.0–0.2)

## 2024-01-27 LAB — CMP (CANCER CENTER ONLY)
ALT: 17 U/L (ref 0–44)
AST: 16 U/L (ref 15–41)
Albumin: 4.1 g/dL (ref 3.5–5.0)
Alkaline Phosphatase: 55 U/L (ref 38–126)
Anion gap: 5 (ref 5–15)
BUN: 11 mg/dL (ref 8–23)
CO2: 26 mmol/L (ref 22–32)
Calcium: 9.2 mg/dL (ref 8.9–10.3)
Chloride: 107 mmol/L (ref 98–111)
Creatinine: 0.88 mg/dL (ref 0.44–1.00)
GFR, Estimated: >60 mL/min (ref >60)
Glucose, Bld: 103 mg/dL — ABNORMAL HIGH (ref 70–99)
Potassium: 4.1 mmol/L (ref 3.5–5.1)
Sodium: 138 mmol/L (ref 135–145)
Total Bilirubin: 0.4 mg/dL (ref 0.0–1.2)
Total Protein: 6.5 g/dL (ref 6.5–8.1)

## 2024-01-27 MED ORDER — CETIRIZINE HCL 10 MG/ML IV SOLN
10.0000 mg | Freq: Once | INTRAVENOUS | Status: AC
  Administered 2024-01-27: 10 mg via INTRAVENOUS
  Filled 2024-01-27: qty 1

## 2024-01-27 MED ORDER — DEXAMETHASONE SOD PHOSPHATE PF 10 MG/ML IJ SOLN
10.0000 mg | Freq: Once | INTRAMUSCULAR | Status: AC
  Administered 2024-01-27: 10 mg via INTRAVENOUS

## 2024-01-27 MED ORDER — SODIUM CHLORIDE 0.9 % IV SOLN: INTRAVENOUS | Status: DC

## 2024-01-27 MED ORDER — FAMOTIDINE IN NACL 20-0.9 MG/50ML-% IV SOLN
20.0000 mg | Freq: Once | INTRAVENOUS | Status: AC
  Administered 2024-01-27: 20 mg via INTRAVENOUS
  Filled 2024-01-27: qty 50

## 2024-01-27 MED ORDER — SODIUM CHLORIDE 0.9 % IV SOLN
80.0000 mg/m2 | Freq: Once | INTRAVENOUS | Status: AC
  Administered 2024-01-27: 138 mg via INTRAVENOUS
  Filled 2024-01-27: qty 23

## 2024-01-27 NOTE — Patient Instructions (Signed)
 [facility] - A DEPT OF [facility]   Discharge Instructions: Thank you for choosing [facility] to provide your oncology and hematology care.   If you have a lab appointment with the Cancer Center, please go directly to the Cancer Center and check in at the registration area.   Wear comfortable clothing and clothing appropriate for easy access to any Portacath or PICC line.   We strive to give you quality time with your provider. You may need to reschedule your appointment if you arrive late (15 or more minutes).  Arriving late affects you and other patients whose appointments are after yours.  Also, if you miss three or more appointments without notifying the office, you may be dismissed from the clinic at the provider's discretion.      For prescription refill requests, have your pharmacy contact our office and allow 72 hours for refills to be completed.    Today you received the following chemotherapy and/or immunotherapy agents: Paclitaxel (Taxol)      To help prevent nausea and vomiting after your treatment, we encourage you to take your nausea medication as directed.  BELOW ARE SYMPTOMS THAT SHOULD BE REPORTED IMMEDIATELY: *FEVER GREATER THAN 100.4 F (38 C) OR HIGHER *CHILLS OR SWEATING *NAUSEA AND VOMITING THAT IS NOT CONTROLLED WITH YOUR NAUSEA MEDICATION *UNUSUAL SHORTNESS OF BREATH *UNUSUAL BRUISING OR BLEEDING *URINARY PROBLEMS (pain or burning when urinating, or frequent urination) *BOWEL PROBLEMS (unusual diarrhea, constipation, pain near the anus) TENDERNESS IN MOUTH AND THROAT WITH OR WITHOUT PRESENCE OF ULCERS (sore throat, sores in mouth, or a toothache) UNUSUAL RASH, SWELLING OR PAIN  UNUSUAL VAGINAL DISCHARGE OR ITCHING   Items with * indicate a potential emergency and should be followed up as soon as possible or go to the Emergency Department if any problems should occur.  Please show the CHEMOTHERAPY ALERT CARD or  IMMUNOTHERAPY ALERT CARD at check-in to the Emergency Department and triage nurse.  Should you have questions after your visit or need to cancel or reschedule your appointment, please contact [facility] - A DEPT OF [facility]  Dept: [phone]  and follow the prompts.  Office hours are 8:00 a.m. to 4:30 p.m. Monday - Friday. Please note that voicemails left after 4:00 p.m. may not be returned until the following business day.  We are closed weekends and major holidays. You have access to a nurse at all times for urgent questions. Please call the main number to the clinic Dept: [phone] and follow the prompts.   For any non-urgent questions, you may also contact your provider using MyChart. We now offer e-Visits for anyone 76 and older to request care online for non-urgent symptoms. For details visit [email or web address].   Also download the MyChart app! Go to the app store, search "MyChart", open the app, select [place], and log in with your MyChart username and password.

## 2024-01-30 ENCOUNTER — Ambulatory Visit (INDEPENDENT_AMBULATORY_CARE_PROVIDER_SITE_OTHER): Admitting: Psychology

## 2024-01-30 DIAGNOSIS — F411 Generalized anxiety disorder: Secondary | ICD-10-CM | POA: Diagnosis not present

## 2024-01-30 NOTE — Progress Notes (Signed)
 [facility] Counselor/Therapist Progress Note  Patient ID: [name], MRN: [id number],    Date: 01/30/2024  Time Spent: 10:02am-10:36am  Pt is seen for in person visit.   Treatment Type: Individual Therapy  Reported Symptoms: anxiety, using skills to redirect when ruminating.  Mental Status Exam: Appearance:  Well Groomed     Behavior: Appropriate  Motor: Normal  Speech/Language:  Clear and Coherent and Normal Rate  Affect: Appropriate  Mood: anxious  Thought process: normal  Thought content:   WNL  Sensory/Perceptual disturbances:   WNL  Orientation: oriented to person, place, time/date, and situation  Attention: Good  Concentration: Good  Memory: WNL  Fund of knowledge:  Good  Insight:   Good  Judgment:  Good  Impulse Control: Good   Risk Assessment: Danger to Self:  No Self-injurious Behavior: No Danger to Others: No Duty to Warn:no Physical Aggression / Violence:No  Access to Firearms a concern: No  Gang Involvement:No   Subjective: counselor assessed pt current functioning per pt report. Processed w/ pt anxiety and rumination.  Assisted w/ identifying rumination and externalizing and identifying what to redirect to.  Reflected progress yesterday w/ shifting after rumination.  Provided resources for guided mindfulness exercises.  Pt affect wnl.  Pt reports anxiety and worry and positive of recent [facility] meeting and take aways re: anxiety.   Pt reports seen her dermatologist yesterday and scheduled for biopsy.  Pt reports began ruminating at appointment and following and was able to recognize and shift.  Pt identified as progress for herself.  Pt receptive to resource shared for [employer] [facility] mindfulness videos.    Interventions: Cognitive Behavioral Therapy and Mindfulness Meditation  Diagnosis:Generalized anxiety disorder  Plan: Pt to f/u weekly/biweekly w/ counseling.  Pt to f/u as scheduled w/ Psychiatrist.  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  [facility] group.     Goal/Needs for Treatment:  In order of importance to patient 1) improved coping w/ anxiety/decrease anxiety 2) --- 3) ---   Client Statement of Needs: Pt  I want to focus on the good things.  I want to be helpful to people w/out destroying my own psyche.     Treatment Level:outpatient counseling  Symptoms:anxiety/on edge, ruminating worry, castrophizing.  Client Treatment Preferences:  in person counseling every 1-2 weeks.  Continue medication management w/ Dr. Dewanda   Healthcare consumer's goal for treatment:  Counselor, Damien Herald, [facility] will support the patient's ability to achieve the goals identified. Cognitive Behavioral Therapy, Assertive Communication/Conflict Resolution Training, Relaxation Training, ACT, Humanistic and other evidenced-based practices will be used to promote progress towards healthy functioning.   Healthcare consumer will: Actively participate in therapy, working towards healthy functioning.    *Justification for Continuation/Discontinuation of Goal: R=Revised, O=Ongoing, A=Achieved, D=Discontinued  Goal 1) Increase effective coping skills to manage anxiety AEB pt increased use of mindfulness skills, reframing anxious thoughts/distortions per pt report and therapist observation. Baseline date 01/09/24: Progress towards goal 0; How Often - Daily Target Date Goal Was reviewed Status Code Progress towards goal/Likert rating  01/08/25                Goal 2) Increase awareness of balance between supporting friends and supporting her own self care AEB Pt report and therapist observation.   Baseline date 01/09/24: Progress towards goal 0; How Often - Daily Target Date Goal Was reviewed Status Code Progress towards goal  01/08/25  This plan has been reviewed and created by the following participants:  This plan will  be reviewed at least every 12 months. Date Behavioral Health Clinician Date Guardian/Patient   01/09/24  [facility] Barbarann [facility] 01/09/24 Verbal Consent Provided and electronic signature captured.            BARBARANN APPL, L[facility]

## 2024-02-02 NOTE — Progress Notes (Unsigned)
 [facility]   Telephone:([phone] Fax:([phone]    Patient Care Team: Geofm Glade PARAS, MD as PCP - General (Internal Medicine) Tasia Lung, MD as Consulting Physician (Psychiatry) Melodi Lerner, MD as Consulting Physician (Orthopedic Surgery) Barbette Knock, MD as Consulting Physician (Obstetrics and Gynecology) Shannon Agent, MD as Consulting Physician (Radiation Oncology) Lanny Callander, MD as Consulting Physician (Hematology) [facility] as Consulting Physician (Radiology) Lita Lye, OD as Referring Physician (Optometry) Ebbie Cough, MD as Consulting Physician (General Surgery) Tyree Nanetta SAILOR, RN as Oncology Nurse Navigator   CHIEF COMPLAINT: Follow up BRCA+ triple negative left breast cancer   Oncology History  Breast cancer, BRCA2 positive, left (HCC)  04/15/2017 Initial Diagnosis   Breast cancer, BRCA2 positive, left (HCC)   09/30/2023 -  Chemotherapy   Patient is on Treatment Plan : BREAST DOSE DENSE AC q14d / PACLitaxel  q7d     Malignant neoplasm of central portion of right breast in female, estrogen receptor positive (HCC)  09/12/2018 Initial Diagnosis   Ductal carcinoma in situ (DCIS) of right breast   09/17/2018 Cancer Staging   Staging form: Breast, AJCC 8th Edition - Clinical: Stage 0 (cTis (DCIS), cN0, cM0, ER+, PR+, HER2: Not Assessed) - Signed by Crawford Morna Pickle, NP on 09/17/2018   10/02/2018 Cancer Staging   Staging form: Breast, AJCC 8th Edition - Pathologic stage from 10/02/2018: Stage IA (pT1b, pN0, cM0, G2, ER+, PR+, HER2-) - Signed by Crawford Morna Pickle, NP on 10/15/2018   Malignant neoplasm of central portion of left breast in female, estrogen receptor negative (HCC)  07/15/2023 Cancer Staging   Staging form: Breast, AJCC 8th Edition - Clinical stage from 07/15/2023: Stage IB (cT1b, cN0, cM0, G2, ER-, PR-, HER2-) - Signed by Lanny Callander, MD on 07/17/2023 Stage prefix: Initial  diagnosis Histologic grading system: 3 grade system   07/17/2023 Initial Diagnosis   Malignant neoplasm of central portion of left breast in female, estrogen receptor negative (HCC)   08/22/2023 Cancer Staging   Staging form: Breast, AJCC 8th Edition - Pathologic stage from 08/22/2023: Stage IIA (pT2, pN0, cM0, G3, ER-, PR-, HER2-) - Signed by Lanny Callander, MD on 12/09/2023 Histologic grading system: 3 grade system Residual tumor (R): R0      CURRENT THERAPY: Adjuvant chemo, AC-T  INTERVAL HISTORY [name] returns for follow up and treatment as scheduled.   ROS   Past Medical History:  Diagnosis Date   Anxiety    Anxiety disorder    BRCA2 positive 07/14/2012   Cancer (HCC) 2008   left breast. Right Breast Cancer in 2020   Dyslipidemia    Fall    Goiter    History of radiation therapy    Hypothyroidism    Osteoporosis      Past Surgical History:  Procedure Laterality Date   ABDOMINAL HYSTERECTOMY  2009   with BSO   BREAST LUMPECTOMY  7991,8023, 1991   BREAST LUMPECTOMY WITH RADIOACTIVE SEED LOCALIZATION Right 10/02/2018   Procedure: RIGHT BREAST LUMPECTOMY WITH RADIOACTIVE SEED LOCALIZATION;  Surgeon: Curvin Deward MOULD, MD;  Location: [facility];  Service: General;  Laterality: Right;   COLONOSCOPY N/A 12/13/2015   Procedure: COLONOSCOPY;  Surgeon: Gladis MARLA Louder, MD;  Location: [facility];  Service: Endoscopy;  Laterality: N/A;   COLONOSCOPY  2023   FRACTURE SURGERY  2015   left hip   HARDWARE REMOVAL Left 07/08/2015   Procedure: LEFT HIP HARDWARE REMOVAL;  Surgeon: Lerner Melodi,  MD;  Location: [facility];  Service: Orthopedics;  Laterality: Left;   MASTECTOMY W/ SENTINEL NODE BIOPSY Left 08/22/2023   Procedure: MASTECTOMY WITH SENTINEL LYMPH NODE BIOPSY;  Surgeon: Ebbie Cough, MD;  Location: [facility];  Service: General;  Laterality: Left;  LMA w/ PEC BLOCK 150 MINUTES LEFT MASTECTOMY LEFT AXILLARY SENTINEL NODE BIOPSY MAGTRACE AND BLUE DYE INJECTION    PORTACATH PLACEMENT N/A 08/22/2023   Procedure: INSERTION, TUNNELED CENTRAL VENOUS DEVICE, WITH PORT;  Surgeon: Ebbie Cough, MD;  Location: [facility];  Service: General;  Laterality: N/A;  PORT PLACEMENT WITH ULTRASOUND GUIDANCE   TOTAL MASTECTOMY Right 08/22/2023   Procedure: MASTECTOMY, SIMPLE;  Surgeon: Ebbie Cough, MD;  Location: [facility];  Service: General;  Laterality: Right;  RIGHT RISK REDUCING MASTECTOMY     Outpatient Encounter Medications as of 02/03/2024  Medication Sig   atorvastatin  (LIPITOR) 10 MG tablet Take 0.5 tablets (5 mg total) by mouth daily.   clorazepate (TRANXENE) 15 MG tablet Take 7.5-15 mg by mouth See admin instructions. Take 15 mg at bedtime, may take a 7.5 mg dose in the morning as needed anxiety   docusate sodium  (COLACE) 250 MG capsule Take 250 mg by mouth daily.   fluconazole  (DIFLUCAN ) 150 MG tablet Take 1 tablet (150 mg total) by mouth daily.   levothyroxine  (SYNTHROID ) 125 MCG tablet Take 1 tablet (125 mcg total) by mouth daily. Take 5 days a  week. (Patient taking differently: Take 125 mcg by mouth every Monday, Tuesday, Wednesday, Thursday, and Friday. Take 5 days a  week.)   lidocaine -prilocaine  (EMLA ) cream Apply to affected area once   methocarbamol  (ROBAXIN ) 500 MG tablet Take 1 tablet (500 mg total) by mouth every 8 (eight) hours as needed (use for muscle cramps/pain).   Multiple Vitamins-Minerals (LUTEIN-ZEAXANTHIN) TABS Take 1 tablet by mouth daily.   ondansetron  (ZOFRAN ) 8 MG tablet Take 1 tab (8 mg) by mouth every 8 hrs as needed for nausea/vomiting. Start third day after doxorubicin /cyclophosphamide  chemotherapy.   prochlorperazine  (COMPAZINE ) 10 MG tablet TAKE 1 TABLET(10 MG) BY MOUTH EVERY 6 HOURS AS NEEDED FOR NAUSEA OR VOMITING   traMADol (ULTRAM) 50 MG tablet Take 1 tablet (50 mg total) by mouth every 6 (six) hours as needed.   traZODone  (DESYREL ) 100 MG tablet Take 200 mg by mouth at bedtime.   VITAMIN D  PO Place 1 Dose under the tongue  daily. drops   No facility-administered encounter medications on file as of 02/03/2024.     There were no vitals filed for this visit. There is no height or weight on file to calculate BMI.   ECOG PERFORMANCE STATUS: {CHL ONC ECOG PS:[id number]}  PHYSICAL EXAM GENERAL:alert, no distress and comfortable SKIN: no rash  EYES: sclera clear NECK: without mass LYMPH:  no palpable cervical or supraclavicular lymphadenopathy  LUNGS: clear with normal breathing effort HEART: regular rate & rhythm, no lower extremity edema ABDOMEN: abdomen soft, non-tender and normal bowel sounds NEURO: alert & oriented x 3 with fluent speech, no focal motor/sensory deficits Breast exam:  PAC without erythema    CBC    Latest Ref Rng & Units 01/27/2024    9:18 AM 01/20/2024   11:24 AM 01/13/2024   12:53 PM  CBC  WBC 4.0 - 10.5 K/uL 3.4  4.4  3.7   Hemoglobin 12.0 - 15.0 g/dL 89.8  89.3  9.9   Hematocrit 36.0 - 46.0 % 30.0  30.9  29.2   Platelets 150 - 400 K/uL 254  290  282  CMP     Latest Ref Rng & Units 01/27/2024    9:18 AM 01/20/2024   11:24 AM 01/13/2024   12:53 PM  CMP  Glucose 70 - 99 mg/dL 896  96  91   BUN 8 - 23 mg/dL 11  13  13    Creatinine 0.44 - 1.00 mg/dL 9.11  9.19  9.25   Sodium 135 - 145 mmol/L 138  136  139   Potassium 3.5 - 5.1 mmol/L 4.1  4.1  3.8   Chloride 98 - 111 mmol/L 107  103  107   CO2 22 - 32 mmol/L 26  27  28    Calcium  8.9 - 10.3 mg/dL 9.2  9.9  9.7   Total Protein 6.5 - 8.1 g/dL 6.5  6.8  6.6   Total Bilirubin 0.0 - 1.2 mg/dL 0.4  0.5  0.5   Alkaline Phos 38 - 126 U/L 55  54  52   AST 15 - 41 U/L 16  16  15    ALT 0 - 44 U/L 17  17  13        ASSESSMENT & PLAN:68 year old post hysterectomy female with    Malignant neoplasm of central portion of left breast in female, estrogen receptor negative, Ki67 80% -cT1bN0M0, stage IB, ER-/PR-/HER2- -breast MRI 09/11/22 detected a non-mass enhancement in L breast but it was felt to be a blood vessel when  biopsy was attempted.  A close follow-up breast MRI 12/14/22 showed persistent linear non mass enhancement in the lateral posterior left breast, bx was benign and concordant.  -breast MRI 06/21/2023 showed a 1.0 cm of non mass enhancement/possible enhancing mass with progressive kinetics which is suspicious for malignancy, biopsy showed grade 2 invasive ductal carcinoma, triple negative.  The area measures 0.8 cm on ultrasound. - S/p port placement and bilateral mastectomy with L SLNB Aug 22, 2023 [name]). Surgical path showed a grade 3, 2.8 cm triple negative left breast adeno carcinoma, negative margins, all 6 lymph nodes were negative. -Staging CT CAP/bone scan negative, baseline echo is normal - Postop course complicated by cellulitis (R mastectomy site/flap) and open port incision, healing. Cleared to begin chemo by Dr. Ebbie first cycle 09/30/23 -Ms. Suniga appears stable, s/p AC and 10 cycles of weekly Taxol    Left hip pain -Since previous hip fracture and surgery -Had pain recurrence after breast surgery which resolved, and recurred during taxol  chemo. We discussed common body/bone aches with taxol  -Xray negative, hardware in place, no fracture or acute abnormality -Pain improved -Refill tramadol PRN   Malignant neoplasm of central portion of right breast, stage 1, pT1b, cN0 IDC, grade 2 -Initially diagnosed with DCIS 09/10/18. Lumpectomy 10/02/18 showed invasive ductal carcinoma. S/p radiation 11/06/18 - 12/25/18.  -Took exemestane  01/2019 - 03/2021, discontinued due to osteoporosis and small benefit. -Mammo 03/2022 was benign, breast MRI 09/11/22 detected a L breast non mass enhancement and incidental opacity in the R lung.  -Follow up CXR image is clear to me, report notes the MRI finding is not well  visualized. She has no respiratory symptoms, non smoker. We decided to hold on CT chest.  -She went back for MRI guided Bx of L br on 09/14/22 but the area appeared more like a vessel, and bx  was not done. Short term MRI 12/14/22 showed persistent linear non mass enhancement in the lateral posterior left breast, bx was benign and concordant.   H/o Left breast stage T2 N0 IDC, ER+/PR+/Her2-  -s/p lumpectomy 03/2007. Oncotype  RS 20, low risk.  -Took antiestrogens from 07/2007 - 09/2018 (Arimidex , tamoxifen , and raloxifene ).   BRCA2+ -found to be BRCA2+ at time of first breast cancer. Bilateral mastectomies not felt to be necessary at that time, but she did undergo TAH w/ BSO 10/2007. -She has adult 2 sons, both tested positive and followed by oncologists where they live -We continue to discuss pancreatic screening and I previously discussed with her GI Dr. Legrand.  -A maternal first cousin who was BRCA+ had pancreatic cancer.  -Guidelines/data are not definitive but recommends screening if BRCA2+ with 1 first degree relative or 2 non-first degree relatives -CT CAP for breast cancer staging showed normal pancreas   Osteoporosis -most recent DEXA 03/23/21 showed T-score -2.9 at right hip. Though density in lumbar spine worsened some, her hip slightly improved from -3.1 in 03/2020. -she was previously on fosamax for many years then prolia injections for one year.  Currently on a treatment holiday -follows with Dr. Melodye at [facility].    PLAN:  No orders of the defined types were placed in this encounter.     All questions were answered. The patient knows to call the clinic with any problems, questions or concerns. No barriers to learning were detected. I spent *** counseling the patient face to face. The total time spent in the appointment was *** and more than 50% was on counseling, review of test results, and coordination of care.   Erica Malachi K Dorwin Fitzhenry, NP 02/02/2024 9:53 AM

## 2024-02-03 ENCOUNTER — Inpatient Hospital Stay: Attending: Hematology

## 2024-02-03 ENCOUNTER — Encounter: Payer: Self-pay | Admitting: Nurse Practitioner

## 2024-02-03 ENCOUNTER — Inpatient Hospital Stay (HOSPITAL_BASED_OUTPATIENT_CLINIC_OR_DEPARTMENT_OTHER): Admitting: Nurse Practitioner

## 2024-02-03 ENCOUNTER — Inpatient Hospital Stay

## 2024-02-03 VITALS — BP 130/60 | HR 68 | Temp 97.8°F | Wt 148.4 lb

## 2024-02-03 VITALS — Resp 16

## 2024-02-03 DIAGNOSIS — C50912 Malignant neoplasm of unspecified site of left female breast: Secondary | ICD-10-CM

## 2024-02-03 DIAGNOSIS — Z1507 Genetic susceptibility to malignant neoplasm of urinary tract: Secondary | ICD-10-CM

## 2024-02-03 DIAGNOSIS — Z1505 Genetic susceptibility to malignant neoplasm of fallopian tube(s): Secondary | ICD-10-CM

## 2024-02-03 DIAGNOSIS — Z1502 Genetic susceptibility to malignant neoplasm of ovary: Secondary | ICD-10-CM

## 2024-02-03 DIAGNOSIS — Z15068 Genetic susceptibility to other malignant neoplasm of digestive system: Secondary | ICD-10-CM

## 2024-02-03 DIAGNOSIS — C50112 Malignant neoplasm of central portion of left female breast: Secondary | ICD-10-CM | POA: Diagnosis not present

## 2024-02-03 DIAGNOSIS — Z1501 Genetic susceptibility to malignant neoplasm of breast: Secondary | ICD-10-CM

## 2024-02-03 DIAGNOSIS — Z171 Estrogen receptor negative status [ER-]: Secondary | ICD-10-CM

## 2024-02-03 DIAGNOSIS — Z15060 Genetic susceptibility to colorectal cancer: Secondary | ICD-10-CM

## 2024-02-03 DIAGNOSIS — Z1509 Genetic susceptibility to other malignant neoplasm: Secondary | ICD-10-CM

## 2024-02-03 LAB — CMP (CANCER CENTER ONLY)
ALT: 13 U/L (ref 0–44)
AST: 11 U/L — ABNORMAL LOW (ref 15–41)
Albumin: 4.1 g/dL (ref 3.5–5.0)
Alkaline Phosphatase: 55 U/L (ref 38–126)
Anion gap: 6 (ref 5–15)
BUN: 15 mg/dL (ref 8–23)
CO2: 26 mmol/L (ref 22–32)
Calcium: 9.3 mg/dL (ref 8.9–10.3)
Chloride: 106 mmol/L (ref 98–111)
Creatinine: 0.95 mg/dL (ref 0.44–1.00)
GFR, Estimated: >60 mL/min (ref >60)
Glucose, Bld: 97 mg/dL (ref 70–99)
Potassium: 4.2 mmol/L (ref 3.5–5.1)
Sodium: 138 mmol/L (ref 135–145)
Total Bilirubin: 0.4 mg/dL (ref 0.0–1.2)
Total Protein: 6.7 g/dL (ref 6.5–8.1)

## 2024-02-03 LAB — CBC WITH DIFFERENTIAL (CANCER CENTER ONLY)
Abs Immature Granulocytes: 0.05 K/uL (ref 0.00–0.07)
Basophils Absolute: 0.1 K/uL (ref 0.0–0.1)
Basophils Relative: 2 %
Eosinophils Absolute: 0.1 K/uL (ref 0.0–0.5)
Eosinophils Relative: 4 %
HCT: 30.3 % — ABNORMAL LOW (ref 36.0–46.0)
Hemoglobin: 10.4 g/dL — ABNORMAL LOW (ref 12.0–15.0)
Immature Granulocytes: 1 %
Lymphocytes Relative: 20 %
Lymphs Abs: 0.7 K/uL (ref 0.7–4.0)
MCH: 31.1 pg (ref 26.0–34.0)
MCHC: 34.3 g/dL (ref 30.0–36.0)
MCV: 90.7 fL (ref 80.0–100.0)
Monocytes Absolute: 0.4 K/uL (ref 0.1–1.0)
Monocytes Relative: 11 %
Neutro Abs: 2.2 K/uL (ref 1.7–7.7)
Neutrophils Relative %: 62 %
Platelet Count: 314 K/uL (ref 150–400)
RBC: 3.34 MIL/uL — ABNORMAL LOW (ref 3.87–5.11)
RDW: 13.3 % (ref 11.5–15.5)
WBC Count: 3.6 K/uL — ABNORMAL LOW (ref 4.0–10.5)
nRBC: 0 % (ref 0.0–0.2)

## 2024-02-03 MED ORDER — SODIUM CHLORIDE 0.9% FLUSH: 10.0000 mL | INTRAVENOUS | Status: DC | PRN

## 2024-02-03 MED ORDER — CETIRIZINE HCL 10 MG/ML IV SOLN
10.0000 mg | Freq: Once | INTRAVENOUS | Status: AC
  Administered 2024-02-03: 10 mg via INTRAVENOUS
  Filled 2024-02-03: qty 1

## 2024-02-03 MED ORDER — SODIUM CHLORIDE 0.9 % IV SOLN: INTRAVENOUS | Status: DC

## 2024-02-03 MED ORDER — SODIUM CHLORIDE 0.9 % IV SOLN
80.0000 mg/m2 | Freq: Once | INTRAVENOUS | Status: AC
  Administered 2024-02-03: 138 mg via INTRAVENOUS
  Filled 2024-02-03: qty 23

## 2024-02-03 MED ORDER — FAMOTIDINE IN NACL 20-0.9 MG/50ML-% IV SOLN
20.0000 mg | Freq: Once | INTRAVENOUS | Status: AC
  Administered 2024-02-03: 20 mg via INTRAVENOUS
  Filled 2024-02-03: qty 50

## 2024-02-03 MED ORDER — DEXAMETHASONE SOD PHOSPHATE PF 10 MG/ML IJ SOLN
10.0000 mg | Freq: Once | INTRAMUSCULAR | Status: AC
  Administered 2024-02-03: 10 mg via INTRAVENOUS

## 2024-02-03 NOTE — Patient Instructions (Signed)
 [facility] - A DEPT OF . [facility]  Discharge Instructions: Thank you for choosing [facility] to provide your oncology and hematology care.   If you have a lab appointment with the Cancer Center, please go directly to the Cancer Center and check in at the registration area.   Wear comfortable clothing and clothing appropriate for easy access to any Portacath or PICC line.   We strive to give you quality time with your provider. You may need to reschedule your appointment if you arrive late (15 or more minutes).  Arriving late affects you and other patients whose appointments are after yours.  Also, if you miss three or more appointments without notifying the office, you may be dismissed from the clinic at the provider's discretion.      For prescription refill requests, have your pharmacy contact our office and allow 72 hours for refills to be completed.    Today you received the following chemotherapy and/or immunotherapy agents taxol       To help prevent nausea and vomiting after your treatment, we encourage you to take your nausea medication as directed.  BELOW ARE SYMPTOMS THAT SHOULD BE REPORTED IMMEDIATELY: *FEVER GREATER THAN 100.4 F (38 C) OR HIGHER *CHILLS OR SWEATING *NAUSEA AND VOMITING THAT IS NOT CONTROLLED WITH YOUR NAUSEA MEDICATION *UNUSUAL SHORTNESS OF BREATH *UNUSUAL BRUISING OR BLEEDING *URINARY PROBLEMS (pain or burning when urinating, or frequent urination) *BOWEL PROBLEMS (unusual diarrhea, constipation, pain near the anus) TENDERNESS IN MOUTH AND THROAT WITH OR WITHOUT PRESENCE OF ULCERS (sore throat, sores in mouth, or a toothache) UNUSUAL RASH, SWELLING OR PAIN  UNUSUAL VAGINAL DISCHARGE OR ITCHING   Items with * indicate a potential emergency and should be followed up as soon as possible or go to the Emergency Department if any problems should occur.  Please show the CHEMOTHERAPY ALERT CARD or IMMUNOTHERAPY ALERT  CARD at check-in to the Emergency Department and triage nurse.  Should you have questions after your visit or need to cancel or reschedule your appointment, please contact [facility] - A DEPT OF [facility]  Dept: [phone]  and follow the prompts.  Office hours are 8:00 a.m. to 4:30 p.m. Monday - Friday. Please note that voicemails left after 4:00 p.m. may not be returned until the following business day.  We are closed weekends and major holidays. You have access to a nurse at all times for urgent questions. Please call the main number to the clinic Dept: [phone] and follow the prompts.   For any non-urgent questions, you may also contact your provider using MyChart. We now offer e-Visits for anyone 80 and older to request care online for non-urgent symptoms. For details visit [email or web address].   Also download the MyChart app! Go to the app store, search "MyChart", open the app, select [place], and log in with your MyChart username and password.

## 2024-02-04 ENCOUNTER — Telehealth: Payer: Self-pay | Admitting: Nurse Practitioner

## 2024-02-04 NOTE — Telephone Encounter (Signed)
 Called the pt and she is aware of her upcoming appt  dates and times.

## 2024-02-09 NOTE — Progress Notes (Unsigned)
 [facility]   Telephone:(336) [phone] Fax:(336) [phone]    Patient Care Team: Geofm Glade PARAS, MD as PCP - General (Internal Medicine) Tasia Lung, MD as Consulting Physician (Psychiatry) Melodi Lerner, MD as Consulting Physician (Orthopedic Surgery) Barbette Knock, MD as Consulting Physician (Obstetrics and Gynecology) Shannon Agent, MD as Consulting Physician (Radiation Oncology) Lanny Callander, MD as Consulting Physician (Hematology) [facility] as Consulting Physician (Radiology) Lita Lye, OD as Referring Physician (Optometry) Ebbie Cough, MD as Consulting Physician (General Surgery) Tyree Nanetta SAILOR, RN as Oncology Nurse Navigator   CHIEF COMPLAINT: Follow up left breast cancer   Oncology History  Breast cancer, BRCA2 positive, left (HCC)  04/15/2017 Initial Diagnosis   Breast cancer, BRCA2 positive, left (HCC)   09/30/2023 -  Chemotherapy   Patient is on Treatment Plan : BREAST DOSE DENSE AC q14d / PACLitaxel  q7d     Malignant neoplasm of central portion of right breast in female, estrogen receptor positive (HCC)  09/12/2018 Initial Diagnosis   Ductal carcinoma in situ (DCIS) of right breast   09/17/2018 Cancer Staging   Staging form: Breast, AJCC 8th Edition - Clinical: Stage 0 (cTis (DCIS), cN0, cM0, ER+, PR+, HER2: Not Assessed) - Signed by Crawford Morna Pickle, NP on 09/17/2018   10/02/2018 Cancer Staging   Staging form: Breast, AJCC 8th Edition - Pathologic stage from 10/02/2018: Stage IA (pT1b, pN0, cM0, G2, ER+, PR+, HER2-) - Signed by Crawford Morna Pickle, NP on 10/15/2018   Malignant neoplasm of central portion of left breast in female, estrogen receptor negative (HCC)  07/15/2023 Cancer Staging   Staging form: Breast, AJCC 8th Edition - Clinical stage from 07/15/2023: Stage IB (cT1b, cN0, cM0, G2, ER-, PR-, HER2-) - Signed by Lanny Callander, MD on 07/17/2023 Stage prefix: Initial diagnosis Histologic grading  system: 3 grade system   07/17/2023 Initial Diagnosis   Malignant neoplasm of central portion of left breast in female, estrogen receptor negative (HCC)   08/22/2023 Cancer Staging   Staging form: Breast, AJCC 8th Edition - Pathologic stage from 08/22/2023: Stage IIA (pT2, pN0, cM0, G3, ER-, PR-, HER2-) - Signed by Lanny Callander, MD on 12/09/2023 Histologic grading system: 3 grade system Residual tumor (R): R0      CURRENT THERAPY: S/p bilateral mastectomy and adjuvant chemo, AC-T  INTERVAL HISTORY [name] presents for f/up and last chemo. She was more tired this weekend but otherwise doing very well. No neuropathy. Some GI issues she attributes to diet.  ROS  All other systems reviewed and negative   Past Medical History:  Diagnosis Date   Anxiety    Anxiety disorder    BRCA2 positive 07/14/2012   Cancer (HCC) 2008   left breast. Right Breast Cancer in 2020   Dyslipidemia    Fall    Goiter    History of radiation therapy    Hypothyroidism    Osteoporosis      Past Surgical History:  Procedure Laterality Date   ABDOMINAL HYSTERECTOMY  2009   with BSO   BREAST LUMPECTOMY  [id number], 1991   BREAST LUMPECTOMY WITH RADIOACTIVE SEED LOCALIZATION Right 10/02/2018   Procedure: RIGHT BREAST LUMPECTOMY WITH RADIOACTIVE SEED LOCALIZATION;  Surgeon: Curvin Deward MOULD, MD;  Location: [facility];  Service: General;  Laterality: Right;   COLONOSCOPY N/A 12/13/2015   Procedure: COLONOSCOPY;  Surgeon: Gladis MARLA Louder, MD;  Location: [facility];  Service: Endoscopy;  Laterality: N/A;   COLONOSCOPY  2023  FRACTURE SURGERY  2015   left hip   HARDWARE REMOVAL Left 07/08/2015   Procedure: LEFT HIP HARDWARE REMOVAL;  Surgeon: Dempsey Moan, MD;  Location: [facility];  Service: Orthopedics;  Laterality: Left;   MASTECTOMY W/ SENTINEL NODE BIOPSY Left 08/22/2023   Procedure: MASTECTOMY WITH SENTINEL LYMPH NODE BIOPSY;  Surgeon: Ebbie Cough, MD;  Location: [facility];  Service:  General;  Laterality: Left;  LMA w/ PEC BLOCK 150 MINUTES LEFT MASTECTOMY LEFT AXILLARY SENTINEL NODE BIOPSY MAGTRACE AND BLUE DYE INJECTION   PORTACATH PLACEMENT N/A 08/22/2023   Procedure: INSERTION, TUNNELED CENTRAL VENOUS DEVICE, WITH PORT;  Surgeon: Ebbie Cough, MD;  Location: [facility];  Service: General;  Laterality: N/A;  PORT PLACEMENT WITH ULTRASOUND GUIDANCE   TOTAL MASTECTOMY Right 08/22/2023   Procedure: MASTECTOMY, SIMPLE;  Surgeon: Ebbie Cough, MD;  Location: [facility];  Service: General;  Laterality: Right;  RIGHT RISK REDUCING MASTECTOMY     Outpatient Encounter Medications as of 02/10/2024  Medication Sig   atorvastatin  (LIPITOR) 10 MG tablet Take 0.5 tablets (5 mg total) by mouth daily.   clorazepate (TRANXENE) 15 MG tablet Take 7.5-15 mg by mouth See admin instructions. Take 15 mg at bedtime, may take a 7.5 mg dose in the morning as needed anxiety   docusate sodium  (COLACE) 250 MG capsule Take 250 mg by mouth daily.   fluconazole  (DIFLUCAN ) 150 MG tablet Take 1 tablet (150 mg total) by mouth daily.   levothyroxine  (SYNTHROID ) 125 MCG tablet Take 1 tablet (125 mcg total) by mouth daily. Take 5 days a  week. (Patient taking differently: Take 125 mcg by mouth every Monday, Tuesday, Wednesday, Thursday, and Friday. Take 5 days a  week.)   lidocaine -prilocaine  (EMLA ) cream Apply to affected area once   methocarbamol  (ROBAXIN ) 500 MG tablet Take 1 tablet (500 mg total) by mouth every 8 (eight) hours as needed (use for muscle cramps/pain).   Multiple Vitamins-Minerals (LUTEIN-ZEAXANTHIN) TABS Take 1 tablet by mouth daily.   ondansetron  (ZOFRAN ) 8 MG tablet Take 1 tab (8 mg) by mouth every 8 hrs as needed for nausea/vomiting. Start third day after doxorubicin /cyclophosphamide  chemotherapy.   prochlorperazine  (COMPAZINE ) 10 MG tablet TAKE 1 TABLET(10 MG) BY MOUTH EVERY 6 HOURS AS NEEDED FOR NAUSEA OR VOMITING   traMADol (ULTRAM) 50 MG tablet Take 1 tablet (50 mg total) by mouth  every 6 (six) hours as needed.   traZODone  (DESYREL ) 100 MG tablet Take 200 mg by mouth at bedtime.   VITAMIN D  PO Place 1 Dose under the tongue daily. drops   No facility-administered encounter medications on file as of 02/10/2024.     Today's Vitals   02/10/24 0942 02/10/24 0954  BP: 138/78   Pulse: 66   Resp: 17   Temp: 97.6 F (36.4 C)   SpO2: 99%   Weight: 148 lb (67.1 kg)   PainSc:  0-No pain   Body mass index is 25.4 kg/m.   ECOG PERFORMANCE STATUS: 1 - Symptomatic but completely ambulatory  PHYSICAL EXAM GENERAL:alert, no distress and comfortable SKIN: no rash  EYES: sclera clear LUNGS:  normal breathing effort HEART: no lower extremity edema NEURO: alert & oriented x 3 with fluent speech, no focal motor/ deficits PAC without erythema    CBC    Latest Ref Rng & Units 02/10/2024    9:16 AM 02/03/2024    9:17 AM 01/27/2024    9:18 AM  CBC  WBC 4.0 - 10.5 K/uL 3.4  3.6  3.4   Hemoglobin  12.0 - 15.0 g/dL 89.3  89.5  89.8   Hematocrit 36.0 - 46.0 % 31.1  30.3  30.0   Platelets 150 - 400 K/uL 257  314  254       CMP     Latest Ref Rng & Units 02/10/2024    9:16 AM 02/03/2024    9:17 AM 01/27/2024    9:18 AM  CMP  Glucose 70 - 99 mg/dL 899  97  896   BUN 8 - 23 mg/dL 13  15  11    Creatinine 0.44 - 1.00 mg/dL 9.12  9.04  9.11   Sodium 135 - 145 mmol/L 138  138  138   Potassium 3.5 - 5.1 mmol/L 4.0  4.2  4.1   Chloride 98 - 111 mmol/L 106  106  107   CO2 22 - 32 mmol/L 26  26  26    Calcium  8.9 - 10.3 mg/dL 9.3  9.3  9.2   Total Protein 6.5 - 8.1 g/dL 6.6  6.7  6.5   Total Bilirubin 0.0 - 1.2 mg/dL 0.4  0.4  0.4   Alkaline Phos 38 - 126 U/L 55  55  55   AST 15 - 41 U/L 12  11  16    ALT 0 - 44 U/L 12  13  17        ASSESSMENT & PLAN:68 year old post hysterectomy female with    Malignant neoplasm of central portion of left breast in female, estrogen receptor negative, Ki67 80% -cT1bN0M0, stage IB, ER-/PR-/HER2- -breast MRI 09/11/22 detected a non-mass  enhancement in L breast but it was felt to be a blood vessel when biopsy was attempted.  A close follow-up breast MRI 12/14/22 showed persistent linear non mass enhancement in the lateral posterior left breast, bx was benign and concordant.  -breast MRI 06/21/2023 showed a 1.0 cm of non mass enhancement/possible enhancing mass with progressive kinetics which is suspicious for malignancy, biopsy showed grade 2 invasive ductal carcinoma, triple negative.  The area measures 0.8 cm on ultrasound. - S/p port placement and bilateral mastectomy with L SLNB Aug 22, 2023 Viktoria). Surgical path showed a grade 3, 2.8 cm triple negative left breast adeno carcinoma, negative margins, all 6 lymph nodes were negative. -Staging CT CAP/bone scan negative, baseline echo is normal - Postop course complicated by cellulitis (R mastectomy site/flap) and open port incision, healing. Cleared to begin chemo by Dr. Ebbie first cycle 09/30/23 -Mrs. Shrader appears stable, s/p AC and 11 cycles of Taxol , tolerating very well with fatigue. SEs adequately managed with supportive care at home.  She is able to recover and function well with excellent performance status.  There is no clinical evidence of recurrence. -Labs reviewed, adequate to proceed with final cycle 12 Taxol  today, no dose adjustments. -Introduced adjuvant PARP inhibitor for further risk reduction in BRCA positive high risk breast cancer patients.  She is interested, I provided her the NEJM article -Continue holding Signatera, ongoing pro/con discussion  -Follow-up in 4 to 6 weeks with port flush and to review PARP inhibitor with Dr. Lanny   Left hip pain -Since previous hip fracture and surgery -Had pain recurrence after breast surgery which resolved, and recurred during taxol  chemo. We discussed common body/bone aches with taxol  -Xray negative, hardware in place, no fracture or acute abnormality -Tramadol PRN -not discussed today, likely resolved    Malignant  neoplasm of central portion of right breast, stage 1, pT1b, cN0 IDC, grade 2 -Initially diagnosed with DCIS  09/10/18. Lumpectomy 10/02/18 showed invasive ductal carcinoma. S/p radiation 11/06/18 - 12/25/18.  -Took exemestane  01/2019 - 03/2021, discontinued due to osteoporosis and small benefit. -Mammo 03/2022 was benign, breast MRI 09/11/22 detected a L breast non mass enhancement and incidental opacity in the R lung.  -Follow up CXR image is clear to me, report notes the MRI finding is not well  visualized. She has no respiratory symptoms, non smoker. We decided to hold on CT chest.  -She went back for MRI guided Bx of L br on 09/14/22 but the area appeared more like a vessel, and bx was not done. Short term MRI 12/14/22 showed persistent linear non mass enhancement in the lateral posterior left breast, bx was benign and concordant.   H/o Left breast stage T2 N0 IDC, ER+/PR+/Her2-  -s/p lumpectomy 03/2007. Oncotype RS 20, low risk.  -Took antiestrogens from 07/2007 - 09/2018 (Arimidex , tamoxifen , and raloxifene ).   BRCA2+ -found to be BRCA2+ at time of first breast cancer. Bilateral mastectomies not felt to be necessary at that time, but she did undergo TAH w/ BSO 10/2007. -She has adult 2 sons, both tested positive and followed by oncologists where they live -We continue to discuss pancreatic screening and I previously discussed with her GI Dr. Legrand.  -A maternal first cousin who was BRCA+ had pancreatic cancer.  -Guidelines/data are not definitive but recommends screening if BRCA2+ with 1 first degree relative or 2 non-first degree relatives -CT CAP for breast cancer staging showed normal pancreas   Osteoporosis -most recent DEXA 03/23/21 showed T-score -2.9 at right hip. Though density in lumbar spine worsened some, her hip slightly improved from -3.1 in 03/2020. -she was previously on fosamax for many years then prolia injections for one year.  Currently on a treatment holiday -follows with Dr. Melodye  at [facility].     PLAN: -Labs reviewed -Proceed with final cycle 12 Taxol , no dose modifications -Flush and follow-up with Dr. Lanny in 4 to 6 weeks to discuss adjuvant PARP inhibitor -Printed materials given for their research -Dental clearance letter given    All questions were answered. The patient knows to call the clinic with any problems, questions or concerns. No barriers to learning were detected. I spent 20 minutes counseling the patient face to face. The total time spent in the appointment was 30 minutes and more than 50% was on counseling, review of test results, and coordination of care.   [name] K Jenavieve Freda, NP 02/10/2024

## 2024-02-10 ENCOUNTER — Inpatient Hospital Stay (HOSPITAL_BASED_OUTPATIENT_CLINIC_OR_DEPARTMENT_OTHER): Admitting: Nurse Practitioner

## 2024-02-10 ENCOUNTER — Inpatient Hospital Stay

## 2024-02-10 ENCOUNTER — Encounter: Payer: Self-pay | Admitting: Nurse Practitioner

## 2024-02-10 VITALS — BP 138/78 | HR 66 | Temp 97.6°F | Resp 17 | Wt 148.0 lb

## 2024-02-10 DIAGNOSIS — Z1589 Genetic susceptibility to other disease: Secondary | ICD-10-CM

## 2024-02-10 DIAGNOSIS — C50912 Malignant neoplasm of unspecified site of left female breast: Secondary | ICD-10-CM | POA: Diagnosis not present

## 2024-02-10 DIAGNOSIS — Z1501 Genetic susceptibility to malignant neoplasm of breast: Secondary | ICD-10-CM | POA: Diagnosis not present

## 2024-02-10 DIAGNOSIS — C50112 Malignant neoplasm of central portion of left female breast: Secondary | ICD-10-CM

## 2024-02-10 DIAGNOSIS — Z171 Estrogen receptor negative status [ER-]: Secondary | ICD-10-CM | POA: Diagnosis not present

## 2024-02-10 DIAGNOSIS — C50412 Malignant neoplasm of upper-outer quadrant of left female breast: Secondary | ICD-10-CM

## 2024-02-10 DIAGNOSIS — Z1509 Genetic susceptibility to other malignant neoplasm: Secondary | ICD-10-CM

## 2024-02-10 DIAGNOSIS — Z1507 Genetic susceptibility to malignant neoplasm of urinary tract: Secondary | ICD-10-CM

## 2024-02-10 DIAGNOSIS — Z5111 Encounter for antineoplastic chemotherapy: Secondary | ICD-10-CM | POA: Diagnosis not present

## 2024-02-10 DIAGNOSIS — Z15068 Genetic susceptibility to other malignant neoplasm of digestive system: Secondary | ICD-10-CM

## 2024-02-10 DIAGNOSIS — Z1505 Genetic susceptibility to malignant neoplasm of fallopian tube(s): Secondary | ICD-10-CM

## 2024-02-10 DIAGNOSIS — Z15060 Genetic susceptibility to colorectal cancer: Secondary | ICD-10-CM

## 2024-02-10 DIAGNOSIS — Z1502 Genetic susceptibility to malignant neoplasm of ovary: Secondary | ICD-10-CM

## 2024-02-10 DIAGNOSIS — C50111 Malignant neoplasm of central portion of right female breast: Secondary | ICD-10-CM

## 2024-02-10 LAB — CMP (CANCER CENTER ONLY)
ALT: 12 U/L (ref 0–44)
AST: 12 U/L — ABNORMAL LOW (ref 15–41)
Albumin: 4.1 g/dL (ref 3.5–5.0)
Alkaline Phosphatase: 55 U/L (ref 38–126)
Anion gap: 6 (ref 5–15)
BUN: 13 mg/dL (ref 8–23)
CO2: 26 mmol/L (ref 22–32)
Calcium: 9.3 mg/dL (ref 8.9–10.3)
Chloride: 106 mmol/L (ref 98–111)
Creatinine: 0.87 mg/dL (ref 0.44–1.00)
GFR, Estimated: >60 mL/min (ref >60)
Glucose, Bld: 100 mg/dL — ABNORMAL HIGH (ref 70–99)
Potassium: 4 mmol/L (ref 3.5–5.1)
Sodium: 138 mmol/L (ref 135–145)
Total Bilirubin: 0.4 mg/dL (ref 0.0–1.2)
Total Protein: 6.6 g/dL (ref 6.5–8.1)

## 2024-02-10 LAB — CBC WITH DIFFERENTIAL (CANCER CENTER ONLY)
Abs Immature Granulocytes: 0.06 K/uL (ref 0.00–0.07)
Basophils Absolute: 0.1 K/uL (ref 0.0–0.1)
Basophils Relative: 2 %
Eosinophils Absolute: 0.2 K/uL (ref 0.0–0.5)
Eosinophils Relative: 5 %
HCT: 31.1 % — ABNORMAL LOW (ref 36.0–46.0)
Hemoglobin: 10.6 g/dL — ABNORMAL LOW (ref 12.0–15.0)
Immature Granulocytes: 2 %
Lymphocytes Relative: 22 %
Lymphs Abs: 0.7 K/uL (ref 0.7–4.0)
MCH: 31 pg (ref 26.0–34.0)
MCHC: 34.1 g/dL (ref 30.0–36.0)
MCV: 90.9 fL (ref 80.0–100.0)
Monocytes Absolute: 0.3 K/uL (ref 0.1–1.0)
Monocytes Relative: 10 %
Neutro Abs: 2.1 K/uL (ref 1.7–7.7)
Neutrophils Relative %: 59 %
Platelet Count: 257 K/uL (ref 150–400)
RBC: 3.42 MIL/uL — ABNORMAL LOW (ref 3.87–5.11)
RDW: 13.2 % (ref 11.5–15.5)
WBC Count: 3.4 K/uL — ABNORMAL LOW (ref 4.0–10.5)
nRBC: 0 % (ref 0.0–0.2)

## 2024-02-10 MED ORDER — CETIRIZINE HCL 10 MG/ML IV SOLN
10.0000 mg | Freq: Once | INTRAVENOUS | Status: AC
  Administered 2024-02-10: 10 mg via INTRAVENOUS
  Filled 2024-02-10: qty 1

## 2024-02-10 MED ORDER — SODIUM CHLORIDE 0.9 % IV SOLN
80.0000 mg/m2 | Freq: Once | INTRAVENOUS | Status: AC
  Administered 2024-02-10: 138 mg via INTRAVENOUS
  Filled 2024-02-10: qty 23

## 2024-02-10 MED ORDER — SODIUM CHLORIDE 0.9 % IV SOLN: INTRAVENOUS | Status: DC

## 2024-02-10 MED ORDER — DEXAMETHASONE SOD PHOSPHATE PF 10 MG/ML IJ SOLN
10.0000 mg | Freq: Once | INTRAMUSCULAR | Status: AC
  Administered 2024-02-10: 10 mg via INTRAVENOUS

## 2024-02-10 MED ORDER — FAMOTIDINE IN NACL 20-0.9 MG/50ML-% IV SOLN
20.0000 mg | Freq: Once | INTRAVENOUS | Status: AC
  Administered 2024-02-10: 20 mg via INTRAVENOUS
  Filled 2024-02-10: qty 50

## 2024-02-10 NOTE — Patient Instructions (Signed)

## 2024-02-12 ENCOUNTER — Other Ambulatory Visit: Payer: Self-pay

## 2024-02-13 ENCOUNTER — Ambulatory Visit (INDEPENDENT_AMBULATORY_CARE_PROVIDER_SITE_OTHER): Admitting: Psychology

## 2024-02-13 DIAGNOSIS — F411 Generalized anxiety disorder: Secondary | ICD-10-CM

## 2024-02-13 NOTE — Progress Notes (Signed)
 [facility] Counselor/Therapist Progress Note  Patient ID: [name], MRN: [id number],    Date: 02/13/2024  Time Spent: 11:03am-11:37am  Pt is seen for in person visit.   Treatment Type: Individual Therapy  Reported Symptoms: less anxiety, less rumination, feeling able to manage  Mental Status Exam: Appearance:  Well Groomed     Behavior: Appropriate  Motor: Normal  Speech/Language:  Clear and Coherent and Normal Rate  Affect: Appropriate  Mood: normal  Thought process: normal  Thought content:   WNL  Sensory/Perceptual disturbances:   WNL  Orientation: oriented to person, place, time/date, and situation  Attention: Good  Concentration: Good  Memory: WNL  Fund of knowledge:  Good  Insight:   Good  Judgment:  Good  Impulse Control: Good   Risk Assessment: Danger to Self:  No Self-injurious Behavior: No Danger to Others: No Duty to Warn:no Physical Aggression / Violence:No  Access to Firearms a concern: No  Gang Involvement:No   Subjective: counselor assessed pt current functioning per pt report. Processed w/ pt improvement in anxiety and rumination.  Assisted w/ identifying contributing factors and use of coping skills.  Reflected how being intentional has assisted in connecting w/ skills.  Encouraged to follow through w/ decisions for what best for her care not to please others.   Pt affect bright.  Pt reports she feels that she is doing well and has found relief from anxiety.  Pt reports anxious what if thoughts still arise but not as often and is able to use ocping skills and not ruminate on.  Pt shared resource of book Open When that she has found helpful as well. Pt reports that her hsuband has researched the screening blood test suggested and doesn't seem to standard of care at this point.  Pt discussed how she has been able to make the decision that doesn't feel good fit w/ current doctor and is planning to change doctors.  Pt reports this is  difficult for her as worries that doctor will be upset and is able to identify distortion and reframe.  Pt and counselor agree for no scheduled f/u at this time and to checkin after a month to see if would like to schedule at that time.      Interventions: Cognitive Behavioral Therapy and Supportive  Diagnosis:Generalized anxiety disorder  Plan:   Pt and counselor agree for no scheduled f/u at this time and to checkin after a month to see if would like to schedule at that time.  Pt to f/u as scheduled w/ Psychiatrist.  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  [place] group.     Goal/Needs for Treatment:  In order of importance to patient 1) improved coping w/ anxiety/decrease anxiety 2) --- 3) ---   Client Statement of Needs: Pt  I want to focus on the good things.  I want to be helpful to people w/out destroying my own psyche.     Treatment Level:outpatient counseling  Symptoms:anxiety/on edge, ruminating worry, castrophizing.  Client Treatment Preferences:  in person counseling every 1-2 weeks.  Continue medication management w/ Dr. Dewanda   Healthcare consumer's goal for treatment:  Counselor, Damien Herald, L[facility] will support the patient's ability to achieve the goals identified. Cognitive Behavioral Therapy, Assertive Communication/Conflict Resolution Training, Relaxation Training, ACT, Humanistic and other evidenced-based practices will be used to promote progress towards healthy functioning.   Healthcare consumer will: Actively participate in therapy, working towards  healthy functioning.    *Justification for Continuation/Discontinuation of Goal: R=Revised, O=Ongoing, A=Achieved, D=Discontinued  Goal 1) Increase effective coping skills to manage anxiety AEB pt increased use of mindfulness skills, reframing anxious thoughts/distortions per pt report and therapist observation. Baseline  date 01/09/24: Progress towards goal 0; How Often - Daily Target Date Goal Was reviewed Status Code Progress towards goal/Likert rating  01/08/25                Goal 2) Increase awareness of balance between supporting friends and supporting her own self care AEB Pt report and therapist observation.   Baseline date 01/09/24: Progress towards goal 0; How Often - Daily Target Date Goal Was reviewed Status Code Progress towards goal  01/08/25                  This plan has been reviewed and created by the following participants:  This plan will be reviewed at least every 12 months. Date Behavioral Health Clinician Date Guardian/Patient   01/09/24  [facility] Barbarann [facility] 01/09/24 Verbal Consent Provided and electronic signature captured.             BARBARANN APPL, LCMHC

## 2024-02-17 ENCOUNTER — Inpatient Hospital Stay

## 2024-02-17 ENCOUNTER — Inpatient Hospital Stay: Admitting: Hematology

## 2024-02-17 ENCOUNTER — Telehealth: Payer: Self-pay

## 2024-02-17 NOTE — Telephone Encounter (Signed)
 Received call from pt's husband, Dr Landy requesting for pt to be switched to Dr Gara care. She currently has a f/u appt scheduled with Dr Lanny 03/17/24 and he is asking for her to see Dr Odean at that time. Advised I would need to get in touch with our administrator and he is agreeable. Email sent.

## 2024-02-21 ENCOUNTER — Telehealth: Payer: Self-pay | Admitting: Hematology and Oncology

## 2024-02-21 NOTE — Telephone Encounter (Signed)
 left fm for this pt to call back and schedule NP appt to see Dr. Gudena

## 2024-02-28 ENCOUNTER — Other Ambulatory Visit: Payer: Self-pay | Admitting: Internal Medicine

## 2024-03-09 ENCOUNTER — Ambulatory Visit: Payer: Self-pay | Attending: General Surgery

## 2024-03-09 VITALS — Wt 143.4 lb

## 2024-03-09 DIAGNOSIS — Z483 Aftercare following surgery for neoplasm: Secondary | ICD-10-CM | POA: Insufficient documentation

## 2024-03-09 NOTE — Therapy (Signed)
 OUTPATIENT PHYSICAL THERAPY SOZO SCREENING NOTE   Patient Name: [name] MRN: [id number] DOB:[birth year], 68 y.o., female Today's Date: 03/09/2024  PCP: Geofm Glade PARAS, MD REFERRING PROVIDER: Ebbie Cough, MD   PT End of Session - 03/09/24 1034     Visit Number 4   # unchanged due to screen only   PT Start Time 1034    PT Stop Time 1040    PT Time Calculation (min) 6 min    Activity Tolerance Patient tolerated treatment well    Behavior During Therapy [facility] for tasks assessed/performed          Past Medical History:  Diagnosis Date   Anxiety    Anxiety disorder    BRCA2 positive 07/14/2012   Cancer (HCC) 2008   left breast. Right Breast Cancer in 2020   Dyslipidemia    Fall    Goiter    History of radiation therapy    Hypothyroidism    Osteoporosis    Past Surgical History:  Procedure Laterality Date   ABDOMINAL HYSTERECTOMY  2009   with BSO   BREAST LUMPECTOMY  [id number], 1991   BREAST LUMPECTOMY WITH RADIOACTIVE SEED LOCALIZATION Right 10/02/2018   Procedure: RIGHT BREAST LUMPECTOMY WITH RADIOACTIVE SEED LOCALIZATION;  Surgeon: Curvin Deward MOULD, MD;  Location: [facility];  Service: General;  Laterality: Right;   COLONOSCOPY N/A 12/13/2015   Procedure: COLONOSCOPY;  Surgeon: Gladis MARLA Louder, MD;  Location: [facility];  Service: Endoscopy;  Laterality: N/A;   COLONOSCOPY  2023   FRACTURE SURGERY  2015   left hip   HARDWARE REMOVAL Left 07/08/2015   Procedure: LEFT HIP HARDWARE REMOVAL;  Surgeon: Dempsey Moan, MD;  Location: [facility];  Service: Orthopedics;  Laterality: Left;   MASTECTOMY W/ SENTINEL NODE BIOPSY Left 08/22/2023   Procedure: MASTECTOMY WITH SENTINEL LYMPH NODE BIOPSY;  Surgeon: Ebbie Cough, MD;  Location: [facility] OR;  Service: General;  Laterality: Left;  LMA w/ PEC BLOCK 150 MINUTES LEFT MASTECTOMY LEFT AXILLARY SENTINEL NODE BIOPSY MAGTRACE AND BLUE DYE INJECTION   PORTACATH PLACEMENT N/A 08/22/2023   Procedure:  INSERTION, TUNNELED CENTRAL VENOUS DEVICE, WITH PORT;  Surgeon: Ebbie Cough, MD;  Location: [facility] OR;  Service: General;  Laterality: N/A;  PORT PLACEMENT WITH ULTRASOUND GUIDANCE   TOTAL MASTECTOMY Right 08/22/2023   Procedure: MASTECTOMY, SIMPLE;  Surgeon: Ebbie Cough, MD;  Location: [facility] OR;  Service: General;  Laterality: Right;  RIGHT RISK REDUCING MASTECTOMY   Patient Active Problem List   Diagnosis Date Noted   Port-A-Cath in place 09/23/2023   S/P bilateral mastectomy 08/22/2023   Malignant neoplasm of central portion of left breast in female, estrogen receptor negative (HCC) 07/17/2023   Anxiety 11/19/2022   Osteoporosis - [facility] end[facility]ology 11/18/2022   Hypothyroidism 11/18/2022   Hyperlipidemia 11/18/2022   BRCA2 gene mutation positive in female 06/10/2022   Malignant neoplasm of central portion of right breast in female, estrogen receptor positive (HCC) 09/12/2018   Breast cancer, BRCA2 positive, left (HCC) 04/15/2017   Breast cancer screening, high risk patient 04/15/2017   Malignant neoplasm of upper-outer quadrant of left breast in female, estrogen receptor positive (HCC) 01/29/2013    REFERRING DIAG: left breast cancer at risk for lymphedema  THERAPY DIAG:  Aftercare following surgery for neoplasm  PERTINENT HISTORY: Left breast Grade 2 IDC, triple negative +BRCA2.  Bil mastectomy 08/22/23 with 6 negative nodes. Will have chemo AC-P. Hx of 2 other episodes of breast cancer 2008 in left breast and 2020 in  Rt breast. Lymph nodes only out on the left, 2 out previously for a total of 8.  Never anything out on the right.  Hx of radiation bilaterally.  In chemotherapy currently. Osteoporosis   PRECAUTIONS: left UE Lymphedema risk, None  SUBJECTIVE: Pt returns for her 3 month L-Dex screen.   PAIN:  Are you having pain? No  SOZO SCREENING: Patient was assessed today using the SOZO machine to determine the lymphedema index score. This was compared to her baseline  score. It was determined that she is within the recommended range when compared to her baseline and no further action is needed at this time. She will continue SOZO screenings. These are done every 3 months for 2 years post operatively followed by every 6 months for 2 years, and then annually.   L-DEX FLOWSHEETS - 03/09/24 1000       L-DEX LYMPHEDEMA SCREENING   Measurement Type Unilateral    L-DEX MEASUREMENT EXTREMITY Upper Extremity    POSITION  Standing    DOMINANT SIDE Right    At Risk Side Left    BASELINE SCORE (UNILATERAL) 5.9    L-DEX SCORE (UNILATERAL) 6.5    VALUE CHANGE (UNILAT) 0.6            Aden Berwyn Caldron, PTA 03/09/2024, 10:41 AM

## 2024-03-10 ENCOUNTER — Other Ambulatory Visit: Payer: Self-pay

## 2024-03-10 NOTE — Progress Notes (Signed)
 PROVIDER:  DONNICE CARLIN BURY, MD  MRN: [id number] DOB: 28-Jun-1955 DATE OF ENCOUNTER: 03/10/2024 Subjective     Chief Complaint: Follow-up     History of Present Illness: This is a 68 year old female who is BRCA positive and underwent bilateral mastectomies with a left sided sentinel node biopsy.  This was a 2.8 cm triple negative tumor with negative sentinel nodes.  She has undergone chemotherapy.  She finished this about a month ago.  She did pretty well during this.  She has some fatigue and is just having some difficulty after chemotherapy.  We discussed this today.  She does not really have any physical issues.  Her Sozo has remained fine.  She is due to see another oncologist next week to discuss care and we discussed that if okay to remove port we can do that.     Medical History: Past Medical History:  Diagnosis Date   History of cancer    Thyroid  disease     Patient Active Problem List  Diagnosis   BRCA2 positive   Mass of upper outer quadrant of left breast   Malignant neoplasm of upper-outer quadrant of left breast in female, estrogen receptor negative (CMS/HHS-HCC)    Past Surgical History:  Procedure Laterality Date   DEEP AXILLARY SENTINEL NODE BIOPSY / EXCISION Left    HYSTERECTOMY     MASTECTOMY BILATERAL SIMPLE     MASTECTOMY PARTIAL / LUMPECTOMY       Allergies  Allergen Reactions   Adhesive Rash   Chlorhexidine  Gluconate Itching    Skin tears   Latex Rash and Swelling    latex   Cephalexin Other (See Comments)    Other reaction(s): Other (See Comments)   Nitrofurantoin Macrocrystal Hives   Povidone-Iodine  Other (See Comments)    Betadine    Ampicillin Hives and Rash    Has patient had a PCN reaction causing immediate rash, facial/tongue/throat swelling, SOB or lightheadedness with hypotension: No  Has patient had a PCN reaction causing severe rash involving mucus membranes or skin necrosis: No  Has patient had a PCN  reaction that required hospitalization No  Has patient had a PCN reaction occurring within the last 10 years: No  If all of the above answers are NO, then may proceed with Cephalosporin use.   Nitrofurantoin Rash    Current Outpatient Medications on File Prior to Visit  Medication Sig Dispense Refill   atorvastatin  (LIPITOR) 10 MG tablet      biotin 1 mg Cap Take by mouth     cholecalciferol (VITAMIN D3) 2,000 unit tablet Take 50 mcg by mouth     clobetasoL (TEMOVATE) 0.05 % ointment APPLY THIN LAYER TOPICALLY TO THE AFFECTED AREA TWICE DAILY     clorazepate (TRANXENE) 15 MG tablet TAKE 1/2 TABLET BY MOUTH EVERY MORNING AND 1 TABLET AT BEDTIME     DOCOSAHEXAENOIC ACID ORAL Take 2 g by mouth once daily     levothyroxine  (SYNTHROID ) 125 MCG tablet TAKE 1 TABLET BY MOUTH 5 TIMES PER WEEK     omega-3 fatty acids 1,000 mg capsule Take by mouth     traZODone  (DESYREL ) 100 MG tablet      tretinoin (RETIN-A) 0.1 % cream APPLY 1 APPLICATION TO FACE EXTERNALLY IN THE EVENING ONCE A DAY FOR 30 DAYS     No current facility-administered medications on file prior to visit.    Family History  Problem Relation Age of Onset   Skin cancer Mother    Hyperlipidemia (Elevated cholesterol)  Mother    Breast cancer Mother    Skin cancer Father    Coronary Artery Disease (Blocked arteries around heart) Father      Social History   Tobacco Use  Smoking Status Former   Types: Cigarettes  Smokeless Tobacco Never     Social History   Socioeconomic History   Marital status: Married  Tobacco Use   Smoking status: Former    Types: Cigarettes   Smokeless tobacco: Never  Vaping Use   Vaping status: Never Used  Substance and Sexual Activity   Alcohol use: Yes   Drug use: Never   Social Drivers of Corporate Investment Banker Strain: Low Risk  (09/13/2023)   Received from [facility]   Overall Financial Resource Strain (CARDIA)    How hard is it for you to pay for the  very basics like food, housing, medical care, and heating?: Not hard at all  Food Insecurity: No Food Insecurity (10/28/2023)   Received from [facility]   Hunger Vital Sign    Within the past 12 months, you worried that your food would run out before you got the money to buy more.: Never true    Within the past 12 months, the food you bought just didn't last and you didn't have money to get more.: Never true  Transportation Needs: No Transportation Needs (10/28/2023)   Received from [facility] - Transportation    In the past 12 months, has lack of transportation kept you from medical appointments or from getting medications?: No    In the past 12 months, has lack of transportation kept you from meetings, work, or from getting things needed for daily living?: No  Physical Activity: Inactive (09/13/2023)   Received from [facility]   Exercise Vital Sign    On average, how many days per week do you engage in moderate to strenuous exercise (like a brisk walk)?: 0 days  Stress: No Stress Concern Present (09/13/2023)   Received from [facility] - Occupational Stress Questionnaire    Do you feel stress - tense, restless, nervous, or anxious, or unable to sleep at night because your mind is troubled all the time - these days?: Only a little  Social Connections: Socially Integrated (09/13/2023)   Received from [facility]   Social Connection and Isolation Panel    In a typical week, how many times do you talk on the phone with family, friends, or neighbors?: More than three times a week    How often do you get together with friends or relatives?: More than three times a week    How often do you attend church or religious services?: More than 4 times per year    Do you belong to any clubs or organizations such as church groups, unions, fraternal or athletic groups, or school groups?: Yes    How often do you attend meetings of the clubs or  organizations you belong to?: More than 4 times per year    Are you married, widowed, divorced, separated, never married, or living with a partner?: Married  Housing Stability: Unknown (07/02/2023)   Housing Stability Vital Sign    Homeless in the Last Year: No    Objective:    Vitals:   03/10/24 0930  PainSc: 0-No pain    There is no height or weight on file to calculate BMI.  Physical Exam   Bilateral mastectomy incisions well-healed, there are  no masses and no adenopathy   Assessment and Plan:     Diagnoses and all orders for this visit:  Malignant neoplasm of upper-outer quadrant of left breast in female, estrogen receptor negative (CMS/HHS-HCC)     She has nothing concerning for any disease on her exam.  Overall she is doing well.  She has been followed by physical therapy.  We discussed port removal.  We discussed this in the office.  She is agreeable to that.  I will wait until she sees medical oncology and gets cleared to have her port removed and then we can plan on scheduling it.  Otherwise I will follow-up in 1 year for annual follow-up.  I discussed with her that I think the physical and mental think she is feeling right now we will get better with some time and recommended a support group as well.  Return in about 1 year (around 03/10/2025).   MATTHEW CARLIN BURY, MD

## 2024-03-17 ENCOUNTER — Inpatient Hospital Stay

## 2024-03-17 ENCOUNTER — Inpatient Hospital Stay: Attending: Hematology | Admitting: Hematology and Oncology

## 2024-03-17 ENCOUNTER — Other Ambulatory Visit: Payer: Self-pay

## 2024-03-17 ENCOUNTER — Inpatient Hospital Stay: Admitting: Hematology

## 2024-03-17 ENCOUNTER — Inpatient Hospital Stay: Admitting: Licensed Clinical Social Worker

## 2024-03-17 VITALS — BP 134/72 | HR 70 | Temp 97.7°F | Resp 17 | Wt 144.0 lb

## 2024-03-17 DIAGNOSIS — Z0000 Encounter for general adult medical examination without abnormal findings: Secondary | ICD-10-CM | POA: Diagnosis not present

## 2024-03-17 DIAGNOSIS — Z9013 Acquired absence of bilateral breasts and nipples: Secondary | ICD-10-CM | POA: Diagnosis not present

## 2024-03-17 DIAGNOSIS — Z1501 Genetic susceptibility to malignant neoplasm of breast: Secondary | ICD-10-CM | POA: Diagnosis not present

## 2024-03-17 DIAGNOSIS — Z79899 Other long term (current) drug therapy: Secondary | ICD-10-CM | POA: Insufficient documentation

## 2024-03-17 DIAGNOSIS — M810 Age-related osteoporosis without current pathological fracture: Secondary | ICD-10-CM | POA: Insufficient documentation

## 2024-03-17 DIAGNOSIS — Z86000 Personal history of in-situ neoplasm of breast: Secondary | ICD-10-CM | POA: Insufficient documentation

## 2024-03-17 DIAGNOSIS — Z79890 Hormone replacement therapy (postmenopausal): Secondary | ICD-10-CM | POA: Diagnosis not present

## 2024-03-17 DIAGNOSIS — Z9221 Personal history of antineoplastic chemotherapy: Secondary | ICD-10-CM | POA: Diagnosis not present

## 2024-03-17 DIAGNOSIS — C50412 Malignant neoplasm of upper-outer quadrant of left female breast: Secondary | ICD-10-CM | POA: Insufficient documentation

## 2024-03-17 DIAGNOSIS — Z170 Estrogen receptor positive status [ER+]: Secondary | ICD-10-CM | POA: Insufficient documentation

## 2024-03-17 NOTE — Assessment & Plan Note (Signed)
 BRCA2 mutation positive 03/2007: T2 N0 left breast IDC status postlumpectomy Oncotype score 20 07/2007-09/2018: Took antiestrogens Arimidex , tamoxifen  and raloxifene   09/10/2018: Right breast DCIS on biopsy 10/02/2018: Right lumpectomy: IDC 11/06/2018-12/25/2018: Adjuvant radiation 01/2019-03/2021: Exemestane  discontinued due to osteoporosis 09/11/2022: Breast MRI: Left breast non-mass enhancement and incidental opacity right lung 09/14/2022: MRI guided biopsy: Could not be performed 12/14/2022: MRI: Persistent linear non-mass enhancement 06/21/2023: Biopsy: Grade 2 IDC triple negative 08/22/2023: Bilateral mastectomies: Grade 3 left breast adenocarcinoma 2.8 cm, margins negative, 0/6 lymph nodes 09/30/2023-02/10/2024: Adjuvant chemotherapy with Adriamycin  and Cytoxan  followed by Taxol    Treatment plan: Based on [place] clinical trial patient was offered adjuvant olaparib. BRCA2 mutation positivity: Additional screenings for pancreatic cancer is being conducted with her gastroenterologist Dr. Legrand

## 2024-03-17 NOTE — Progress Notes (Signed)
 Patient Care Team: Geofm Glade PARAS, MD as PCP - General (Internal Medicine) Tasia Lung, MD as Consulting Physician (Psychiatry) Melodi Lerner, MD as Consulting Physician (Orthopedic Surgery) Barbette Knock, MD as Consulting Physician (Obstetrics and Gynecology) Shannon Agent, MD as Consulting Physician (Radiation Oncology) Lanny Callander, MD as Consulting Physician (Hematology) [facility] as Consulting Physician (Radiology) Lita Lye, OD as Referring Physician (Optometry) Ebbie Cough, MD as Consulting Physician (General Surgery) Tyree Nanetta SAILOR, RN as Oncology Nurse Navigator  DIAGNOSIS:  Encounter Diagnoses  Name Primary?   Malignant neoplasm of upper-outer quadrant of left breast in female, estrogen receptor positive (HCC) Yes   Health maintenance examination    Age-related osteoporosis without current pathological fracture     SUMMARY OF ONCOLOGIC HISTORY: Oncology History  Breast cancer, BRCA2 positive, left (HCC)  04/15/2017 Initial Diagnosis   Breast cancer, BRCA2 positive, left (HCC)   09/30/2023 -  Chemotherapy   Patient is on Treatment Plan : BREAST DOSE DENSE AC q14d / PACLitaxel  q7d     Malignant neoplasm of central portion of right breast in female, estrogen receptor positive (HCC)  09/12/2018 Initial Diagnosis   Ductal carcinoma in situ (DCIS) of right breast   09/17/2018 Cancer Staging   Staging form: Breast, AJCC 8th Edition - Clinical: Stage 0 (cTis (DCIS), cN0, cM0, ER+, PR+, HER2: Not Assessed) - Signed by Crawford Morna Pickle, NP on 09/17/2018   10/02/2018 Cancer Staging   Staging form: Breast, AJCC 8th Edition - Pathologic stage from 10/02/2018: Stage IA (pT1b, pN0, cM0, G2, ER+, PR+, HER2-) - Signed by Crawford Morna Pickle, NP on 10/15/2018   Malignant neoplasm of central portion of left breast in female, estrogen receptor negative (HCC)  07/15/2023 Cancer Staging   Staging form: Breast, AJCC 8th Edition -  Clinical stage from 07/15/2023: Stage IB (cT1b, cN0, cM0, G2, ER-, PR-, HER2-) - Signed by Lanny Callander, MD on 07/17/2023 Stage prefix: Initial diagnosis Histologic grading system: 3 grade system   07/17/2023 Initial Diagnosis   Malignant neoplasm of central portion of left breast in female, estrogen receptor negative (HCC)   08/22/2023 Cancer Staging   Staging form: Breast, AJCC 8th Edition - Pathologic stage from 08/22/2023: Stage IIA (pT2, pN0, cM0, G3, ER-, PR-, HER2-) - Signed by Lanny Callander, MD on 12/09/2023 Histologic grading system: 3 grade system Residual tumor (R): R0     CHIEF COMPLIANT: Recently completed chemotherapy for triple negative breast cancer  HISTORY OF PRESENT ILLNESS: Mrs. [name] is a 68 year old who had a history of bilateral breast cancers.  She was diagnosed with a triple negative left breast cancer in April 2025 and was treated with bilateral mastectomies followed by adjuvant chemotherapy with stents Adriamycin  Cytoxan  followed by Taxol  x 12.  This was completed in November.  She wanted to discuss with us  about surveillance as well as adjuvant treatment options. She has been feeling emotionally overwhelmed and depressed because of lack of quick recovery from undergoing chemotherapy.  She feels overall quite tired but she is able to get around and do all of her ADLs.  She is accompanied today by her husband [name] retired primary care physician.   ALLERGIES:  is allergic to adhesive [tape], chloraprep one step [chlorhexidine  gluconate], latex, shellfish allergy, shellfish protein-containing drug products, cephalexin, ampicillin, betadine  [povidone iodine ], and macrodantin [nitrofurantoin].  MEDICATIONS:  Current Outpatient Medications  Medication Sig Dispense Refill   atorvastatin  (LIPITOR) 10 MG tablet Take 0.5 tablets (5 mg total) by mouth daily. 90 tablet 1  clorazepate (TRANXENE) 15 MG tablet Take 7.5-15 mg by mouth See admin instructions. Take 15 mg at bedtime, may  take a 7.5 mg dose in the morning as needed anxiety     docusate sodium  (COLACE) 250 MG capsule Take 250 mg by mouth daily.     fluconazole  (DIFLUCAN ) 150 MG tablet Take 1 tablet (150 mg total) by mouth daily. 1 tablet 0   levothyroxine  (SYNTHROID ) 125 MCG tablet TAKE 1 TABLET DAILY 5 DAYS A WEEK 25 tablet 0   lidocaine -prilocaine  (EMLA ) cream Apply to affected area once 30 g 3   methocarbamol  (ROBAXIN ) 500 MG tablet Take 1 tablet (500 mg total) by mouth every 8 (eight) hours as needed (use for muscle cramps/pain). 30 tablet 0   Multiple Vitamins-Minerals (LUTEIN-ZEAXANTHIN) TABS Take 1 tablet by mouth daily.     ondansetron  (ZOFRAN ) 8 MG tablet Take 1 tab (8 mg) by mouth every 8 hrs as needed for nausea/vomiting. Start third day after doxorubicin /cyclophosphamide  chemotherapy. 30 tablet 1   prochlorperazine  (COMPAZINE ) 10 MG tablet TAKE 1 TABLET(10 MG) BY MOUTH EVERY 6 HOURS AS NEEDED FOR NAUSEA OR VOMITING 30 tablet 1   traMADol  (ULTRAM ) 50 MG tablet Take 1 tablet (50 mg total) by mouth every 6 (six) hours as needed. 15 tablet 0   traZODone  (DESYREL ) 100 MG tablet Take 200 mg by mouth at bedtime.     VITAMIN D  PO Place 1 Dose under the tongue daily. drops     No current facility-administered medications for this visit.    PHYSICAL EXAMINATION: ECOG PERFORMANCE STATUS: 1 - Symptomatic but completely ambulatory  Vitals:   03/17/24 1301  BP: 134/72  Pulse: 70  Resp: 17  Temp: 97.7 F (36.5 C)  SpO2: 100%   Filed Weights   03/17/24 1301  Weight: 144 lb (65.3 kg)      LABORATORY DATA:  I have reviewed the data as listed    Latest Ref Rng & Units 02/10/2024    9:16 AM 02/03/2024    9:17 AM 01/27/2024    9:18 AM  CMP  Glucose 70 - 99 mg/dL 899  97  896   BUN 8 - 23 mg/dL 13  15  11    Creatinine 0.44 - 1.00 mg/dL 9.12  9.04  9.11   Sodium 135 - 145 mmol/L 138  138  138   Potassium 3.5 - 5.1 mmol/L 4.0  4.2  4.1   Chloride 98 - 111 mmol/L 106  106  107   CO2 22 - 32 mmol/L 26   26  26    Calcium  8.9 - 10.3 mg/dL 9.3  9.3  9.2   Total Protein 6.5 - 8.1 g/dL 6.6  6.7  6.5   Total Bilirubin 0.0 - 1.2 mg/dL 0.4  0.4  0.4   Alkaline Phos 38 - 126 U/L 55  55  55   AST 15 - 41 U/L 12  11  16    ALT 0 - 44 U/L 12  13  17      Lab Results  Component Value Date   WBC 3.4 (L) 02/10/2024   HGB 10.6 (L) 02/10/2024   HCT 31.1 (L) 02/10/2024   MCV 90.9 02/10/2024   PLT 257 02/10/2024   NEUTROABS 2.1 02/10/2024    ASSESSMENT & PLAN:  Malignant neoplasm of upper-outer quadrant of left breast in female, estrogen receptor positive (HCC) BRCA2 mutation positive 03/2007: T2 N0 left breast IDC status postlumpectomy Oncotype score 20 07/2007-09/2018: Took antiestrogens Arimidex , tamoxifen  and raloxifene   09/10/2018: Right breast DCIS on biopsy 10/02/2018: Right lumpectomy: IDC 11/06/2018-12/25/2018: Adjuvant radiation 01/2019-03/2021: Exemestane  discontinued due to osteoporosis 09/11/2022: Breast MRI: Left breast non-mass enhancement and incidental opacity right lung 09/14/2022: MRI guided biopsy: Could not be performed 12/14/2022: MRI: Persistent linear non-mass enhancement 06/21/2023: Biopsy: Grade 2 IDC triple negative 08/22/2023: Bilateral mastectomies: Grade 3 left breast adenocarcinoma 2.8 cm, margins negative, 0/6 lymph nodes 09/30/2023-02/10/2024: Adjuvant chemotherapy with Adriamycin  and Cytoxan  followed by Taxol    Treatment plan: Based on [name] clinical trial we discussed the role of adjuvant olaparib. Olaparib: [name] is a PARP inhibitor which has been approved for metastatic breast cancer with BRCA2 mutation The [employer] trial is a phase 3 randomized trial that compared the efficacy and safety of olaparib with those of single-agent chemotherapy (physician's choice) in women with  HER2-negative metastatic breast cancer and a germline BRCA mutation At a median follow-up of 14 months, progression-free survival was 2.8 months longer and the risk for disease progression or death  was 42% lower with olaparib monotherapy than with chemotherapy.  Adverse effects counseling: Olaparib can cause gastrointestinal toxicity, cytopenias, fatigue, generalized weakness etc.  We will postpone the decision regarding olaparib to 3 months from now.  BRCA2 mutation positivity: Additional screenings for pancreatic cancer is being conducted with her gastroenterologist Dr. Legrand Babcock reveal discussion: We had a lengthy discussion about the utility of the test.  I explained to her that although it could be a useful test and it is still not on the guidelines and it would be perfectly reasonable to skip doing the testing until it comes up on our current guidelines.  She is extremely anxious and wonders if the test is going to cause her more emotional stress.  Prior history of anemia: Will recheck labs I requested Dr. Ebbie to remove her port Return to clinic in 3 months for follow-up  Orders Placed This Encounter  Procedures   CBC with Differential (Cancer Center Only)    Standing Status:   Future    Expiration Date:   03/17/2025   Thyroid  Panel With TSH    Standing Status:   Future    Expiration Date:   03/17/2025   Lipid panel    Standing Status:   Future    Expiration Date:   03/17/2025   Vitamin D  25 hydroxy    Standing Status:   Future    Expiration Date:   03/17/2025   The patient has a good understanding of the overall plan. she agrees with it. she will call with any problems that may develop before the next visit here.  I personally spent a total of 45 minutes in the care of the patient today including preparing to see the patient, getting/reviewing separately obtained history, performing a medically appropriate exam/evaluation, counseling and educating, placing orders, referring and communicating with other health care professionals, documenting clinical information in the EHR, independently interpreting results, communicating results, and coordinating care.    Viinay K Larua Collier, MD 03/17/2024

## 2024-03-17 NOTE — Progress Notes (Signed)
 [facility] CSW Counseling Note  Patient was referred by self. Treatment type: Individual  Presenting Concerns: Patient and/or family reports the following symptoms/concerns: anxiety and depression Duration of problem: 1 months- worse since end of treatment; Severity of problem: severe   Orientation:oriented to person, place, time/date, and situation.   Affect: Appropriate, Congruent, and Tearful Risk of harm to self or others: No plan to harm self or others  Patient and/or Family's Strengths/Protective Factors: Social connections and [place] supports in place (healthy food, safe environments, etc.)Ability for insight  Active sense of humor  Capable of independent living  Motivation for treatment/growth  Special hobby/interest  Supportive family/friends      Goals Addressed: Patient will:  Reduce symptoms of: anxiety and depression Increase knowledge and/or ability of: coping skills  Increase healthy adjustment to current life circumstances   Progress towards Goals: Initial   Interventions: Interventions utilized:  Mindfulness or Management Consultant, CBT Cognitive Behavioral Therapy, and Psychoeducation and/or Health Education  GAD 7 PHQ 9 CSSRS     03/17/2024    3:31 PM 02/10/2024    9:54 AM 02/10/2024    9:53 AM  PHQ9 SCORE ONLY  PHQ-9 Total Score 17 0 0       03/17/2024    3:30 PM  GAD 7 : Generalized Anxiety Score  Nervous, Anxious, on Edge 3  Control/stop worrying 3  Worry too much - different things 3  Trouble relaxing 2  Restless 2  Easily annoyed or irritable 2  Afraid - awful might happen 3  Total GAD 7 Score 18  Anxiety Difficulty Somewhat difficult       Assessment: Patient currently experiencing severe anxiety and depression, especially intense since finishing cancer treatment (bilateral mastectomy, chemotherapy) for her 3rd round of breast cancer (previous in 2008 & 2020). Pt reports doing well through treatment physically and mentally with focusing  on the next steps.  Since ending, there has been more worry about herself and loved ones (BRCA+, friends with late stage cancers), struggle with mismatch in expectation of being back to herself while in reality she is still recovering.   She is on medication for anxiety. Reports feeling anxiety more in her chest and gut with nausea. Has tried journaling and previous counseling, but it was not the right fit.   Pt enjoys time with friends, walking, and bridge (playing & teaching it).  She is balancing doing activities while still regaining stamina and energy. Plans to join a gym through [employer] in January.  CSW provided psychoeducation on emotional adjustment to cancer and survivorship as well as physiological response to anxiety.  Practiced breathing exercises (following breath; 4-7-8 anti-anxiety breath) and a loving-kindness meditation today. Began discussion on noticing unhelpful thoughts and changing them using and statements.      Plan: Follow up with CSW: 12/29 9am Behavioral recommendations: practice anti-anxiety breath 2x/day (when you wake up and before bed).  Notice if you're having recurring unhelpful thoughts so we can address at next sessions Referral(s): Support group(s):  [employer] class in March       Elver Stadler E Mykeisha Dysert, LCSW

## 2024-03-18 ENCOUNTER — Ambulatory Visit: Payer: Medicare Other

## 2024-03-18 VITALS — Ht 64.0 in | Wt 144.0 lb

## 2024-03-18 DIAGNOSIS — Z1159 Encounter for screening for other viral diseases: Secondary | ICD-10-CM

## 2024-03-18 DIAGNOSIS — Z0000 Encounter for general adult medical examination without abnormal findings: Secondary | ICD-10-CM

## 2024-03-18 NOTE — Patient Instructions (Signed)
 Ms. [name],  Thank you for taking the time for your Medicare Wellness Visit. I appreciate your continued commitment to your health goals. Please review the care plan we discussed, and feel free to reach out if I can assist you further.  Please note that Annual Wellness Visits do not include a physical exam. Some assessments may be limited, especially if the visit was conducted virtually. If needed, we may recommend an in-person follow-up with your provider.  Ongoing Care Seeing your primary care provider every 3 to 6 months helps us  monitor your health and provide consistent, personalized care.   Referrals If a referral was made during today's visit and you haven't received any updates within two weeks, please contact the referred provider directly to check on the status.  Recommended Screenings:  Health Maintenance  Topic Date Due   Hepatitis C Screening  Never done   Zoster (Shingles) Vaccine (1 of 2) 07/17/1973   Breast Cancer Screening  06/20/2024   COVID-19 Vaccine (8 - Mixed Product risk 2025-26 season) 07/25/2024   Medicare Annual Wellness Visit  03/18/2025   Colon Cancer Screening  05/05/2026   DTaP/Tdap/Td vaccine (4 - Td or Tdap) 11/23/2028   Pneumococcal Vaccine for age over 17  Completed   Flu Shot  Completed   Osteoporosis screening with Bone Density Scan  Completed   Meningitis B Vaccine  Aged Out       03/18/2024   10:17 AM  Advanced Directives  Does Patient Have a Medical Advance Directive? Yes  Type of Estate Agent of [place];Living will  Copy of Healthcare Power of Attorney in Chart? No - copy requested    Vision: Annual vision screenings are recommended for early detection of glaucoma, cataracts, and diabetic retinopathy. These exams can also reveal signs of chronic conditions such as diabetes and high blood pressure.  Dental: Annual dental screenings help detect early signs of oral cancer, gum disease, and other conditions linked to  overall health, including heart disease and diabetes.  Managing Pain Without Opioids Opioids are strong medicines used to treat moderate to severe pain. For some people, especially those who have long-term (chronic) pain, opioids may not be the best choice for pain management due to: Side effects like nausea, constipation, and sleepiness. The risk of addiction (opioid use disorder). The longer you take opioids, the greater your risk of addiction. Pain that lasts for more than 3 months is called chronic pain. Managing chronic pain usually requires more than one approach and is often provided by a team of health care providers working together (multidisciplinary approach). Pain management may be done at a pain management center or pain clinic. How to manage pain without the use of opioids Use non-opioid medicines Non-opioid medicines for pain may include: Over-the-counter or prescription non-steroidal anti-inflammatory drugs (NSAIDs). These may be the first medicines used for pain. They work well for muscle and bone pain, and they reduce swelling. Acetaminophen . This over-the-counter medicine may work well for milder pain but not swelling. Antidepressants. These may be used to treat chronic pain. A certain type of antidepressant (tricyclics) is often used. These medicines are given in lower doses for pain than when used for depression. Anticonvulsants. These are usually used to treat seizures but may also reduce nerve (neuropathic) pain. Muscle relaxants. These relieve pain caused by sudden muscle tightening (spasms). You may also use a pain medicine that is applied to the skin as a patch, cream, or gel (topical analgesic), such as a numbing medicine. These  may cause fewer side effects than medicines taken by mouth. Do certain therapies as directed Some therapies can help with pain management. They include: Physical therapy. You will do exercises to gain strength and flexibility. A physical therapist  may teach you exercises to move and stretch parts of your body that are weak, stiff, or painful. You can learn these exercises at physical therapy visits and practice them at home. Physical therapy may also involve: Massage. Heat wraps or applying heat or cold to affected areas. Electrical signals that interrupt pain signals (transcutaneous electrical nerve stimulation, TENS). Weak lasers that reduce pain and swelling (low-level laser therapy). Signals from your body that help you learn to regulate pain (biofeedback). Occupational therapy. This helps you to learn ways to function at home and work with less pain. Recreational therapy. This involves trying new activities or hobbies, such as a physical activity or drawing. Mental health therapy, including: Cognitive behavioral therapy (CBT). This helps you learn coping skills for dealing with pain. Acceptance and commitment therapy (ACT) to change the way you think and react to pain. Relaxation therapies, including muscle relaxation exercises and mindfulness-based stress reduction. Pain management counseling. This may be individual, family, or group counseling.  Receive medical treatments Medical treatments for pain management include: Nerve block injections. These may include a pain blocker and anti-inflammatory medicines. You may have injections: Near the spine to relieve chronic back or neck pain. Into joints to relieve back or joint pain. Into nerve areas that supply a painful area to relieve body pain. Into muscles (trigger point injections) to relieve some painful muscle conditions. A medical device placed near your spine to help block pain signals and relieve nerve pain or chronic back pain (spinal cord stimulation device). Acupuncture. Follow these instructions at home Medicines Take over-the-counter and prescription medicines only as told by your health care provider. If you are taking pain medicine, ask your health care providers  about possible side effects to watch out for. Do not drive or use heavy machinery while taking prescription opioid pain medicine. Lifestyle  Do not use drugs or alcohol to reduce pain. If you drink alcohol, limit how much you have to: 0-1 drink a day for women who are not pregnant. 0-2 drinks a day for men. Know how much alcohol is in a drink. In the [place] one drink equals one 12 oz bottle of beer (355 mL), one 5 oz glass of wine (148 mL), or one 1 oz glass of hard liquor (44 mL). Do not use any products that contain nicotine or tobacco. These products include cigarettes, chewing tobacco, and vaping devices, such as e-cigarettes. If you need help quitting, ask your health care provider. Eat a healthy diet and maintain a healthy weight. Poor diet and excess weight may make pain worse. Eat foods that are high in fiber. These include fresh fruits and vegetables, whole grains, and beans. Limit foods that are high in fat and processed sugars, such as fried and sweet foods. Exercise regularly. Exercise lowers stress and may help relieve pain. Ask your health care provider what activities and exercises are safe for you. If your health care provider approves, join an exercise class that combines movement and stress reduction. Examples include yoga and tai chi. Get enough sleep. Lack of sleep may make pain worse. Lower stress as much as possible. Practice stress reduction techniques as told by your therapist. General instructions Work with all your pain management providers to find the treatments that work best for you. You  are an important member of your pain management team. There are many things you can do to reduce pain on your own. Consider joining an online or in-person support group for people who have chronic pain. Keep all follow-up visits. This is important. Where to find more information You can find more information about managing pain without opioids from: American Academy of Pain  Medicine: painmed.org Institute for Chronic Pain: instituteforchronicpain.org American Chronic Pain Association: theacpa.org Contact a health care provider if: You have side effects from pain medicine. Your pain gets worse or does not get better with treatments or home therapy. You are struggling with anxiety or depression. Summary Many types of pain can be managed without opioids. Chronic pain may respond better to pain management without opioids. Pain is best managed when you and a team of health care providers work together. Pain management without opioids may include non-opioid medicines, medical treatments, physical therapy, mental health therapy, and lifestyle changes. Tell your health care providers if your pain gets worse or is not being managed well enough. This information is not intended to replace advice given to you by your health care provider. Make sure you discuss any questions you have with your health care provider. Document Revised: 06/29/2020 Document Reviewed: 06/29/2020 Elsevier Patient Education  2024 [employer].

## 2024-03-18 NOTE — Progress Notes (Signed)
 Chief Complaint  Patient presents with   Medicare Wellness     Subjective:   [name] is a 68 y.o. female who presents for a [employer] Visit.  Visit info / Clinical Intake: Persons participating in visit and providing information:: patient Medicare Wellness Visit Mode:: Telephone If telephone:: video declined Since this visit was completed virtually, some vitals may be partially provided or unavailable. Missing vitals are due to the limitations of the virtual format.: Documented vitals are patient reported If Telephone or Video please confirm:: I connected with patient using audio/video enable telemedicine. I verified patient identity with two identifiers, discussed telehealth limitations, and patient agreed to proceed. Patient Location:: Home Provider Location:: Office Interpreter Needed?: No Pre-visit prep was completed: yes AWV questionnaire completed by patient prior to visit?: yes Date:: 03/17/24 Living arrangements:: lives with spouse/significant other Patient's Overall Health Status Rating: good Typical amount of pain: none Does pain affect daily life?: no Are you currently prescribed opioids?: (!) yes  Dietary Habits and Nutritional Risks How many meals a day?: 3 (2-3) Eats fruit and vegetables daily?: yes Most meals are obtained by: preparing own meals In the last 2 weeks, have you had any of the following?: none Diabetic:: no  Functional Status Activities of Daily Living (to include ambulation/medication): Independent Ambulation: Independent with device- listed below Home Assistive Devices/Equipment: Eyeglasses Medication Administration: Independent Home Management (perform basic housework or laundry): Independent Manage your own finances?: yes Primary transportation is: driving Concerns about vision?: no *vision screening is required for WTM* Concerns about hearing?: no  Fall Screening Falls in the past year?: 0 Number of falls in  past year: 0 Was there an injury with Fall?: 0 Fall Risk Category Calculator: 0 Patient Fall Risk Level: Low Fall Risk  Fall Risk Patient at Risk for Falls Due to: No Fall Risks Fall risk Follow up: Falls evaluation completed; Falls prevention discussed  Home and Transportation Safety: All rugs have non-skid backing?: yes All stairs or steps have railings?: yes Grab bars in the bathtub or shower?: (!) no Have non-skid surface in bathtub or shower?: yes Good home lighting?: yes Regular seat belt use?: yes Hospital stays in the last year:: (!) yes How many hospital stays:: 1 Reason: Breast Cancer - Double mastectomy  Cognitive Assessment Difficulty concentrating, remembering, or making decisions? : no Will 6CIT or Mini Cog be Completed: yes What year is it?: 0 points What month is it?: 0 points Give patient an address phrase to remember (5 components): [address] [place], [place] About what time is it?: 0 points  Advance Directives (For Healthcare) Does Patient Have a Medical Advance Directive?: Yes Type of Advance Directive: Healthcare Power of [place]; Living will Copy of Healthcare Power of Attorney in Chart?: No - copy requested Copy of Living Will in Chart?: No - copy requested Would patient like information on creating a medical advance directive?: No - Patient declined  Reviewed/Updated  Reviewed/Updated: Reviewed All (Medical, Surgical, Family, Medications, Allergies, Care Teams, Patient Goals)    Allergies (verified) Adhesive [tape], Chloraprep one step [chlorhexidine  gluconate], Latex, Shellfish allergy, Shellfish protein-containing drug products, Cephalexin, Ampicillin, Betadine  [povidone iodine ], and Macrodantin [nitrofurantoin]   Current Medications (verified) Outpatient Encounter Medications as of 03/18/2024  Medication Sig   atorvastatin  (LIPITOR) 10 MG tablet Take 0.5 tablets (5 mg total) by mouth daily.   clorazepate (TRANXENE) 15 MG tablet Take 7.5-15  mg by mouth See admin instructions. Take 15 mg at bedtime, may take a 7.5 mg dose  in the morning as needed anxiety   docusate sodium  (COLACE) 250 MG capsule Take 250 mg by mouth daily.   fluconazole  (DIFLUCAN ) 150 MG tablet Take 1 tablet (150 mg total) by mouth daily.   levothyroxine  (SYNTHROID ) 125 MCG tablet TAKE 1 TABLET DAILY 5 DAYS A WEEK   lidocaine -prilocaine  (EMLA ) cream Apply to affected area once   methocarbamol  (ROBAXIN ) 500 MG tablet Take 1 tablet (500 mg total) by mouth every 8 (eight) hours as needed (use for muscle cramps/pain).   Multiple Vitamins-Minerals (LUTEIN-ZEAXANTHIN) TABS Take 1 tablet by mouth daily.   ondansetron  (ZOFRAN ) 8 MG tablet Take 1 tab (8 mg) by mouth every 8 hrs as needed for nausea/vomiting. Start third day after doxorubicin /cyclophosphamide  chemotherapy.   prochlorperazine  (COMPAZINE ) 10 MG tablet TAKE 1 TABLET(10 MG) BY MOUTH EVERY 6 HOURS AS NEEDED FOR NAUSEA OR VOMITING   traMADol  (ULTRAM ) 50 MG tablet Take 1 tablet (50 mg total) by mouth every 6 (six) hours as needed. (Patient taking differently: Take 50 mg by mouth every 6 (six) hours as needed for moderate pain (pain score 4-6).)   traZODone  (DESYREL ) 100 MG tablet Take 200 mg by mouth at bedtime.   VITAMIN D  PO Place 1 Dose under the tongue daily. drops   No facility-administered encounter medications on file as of 03/18/2024.    History: Past Medical History:  Diagnosis Date   Anxiety    Anxiety disorder    BRCA2 positive 07/14/2012   Cancer (HCC) 2008   left breast. Right Breast Cancer in 2020   Dyslipidemia    Fall    Goiter    History of radiation therapy    Hypothyroidism    Osteoporosis    Past Surgical History:  Procedure Laterality Date   ABDOMINAL HYSTERECTOMY  2009   with BSO   BREAST LUMPECTOMY  [id number], 1991   BREAST LUMPECTOMY WITH RADIOACTIVE SEED LOCALIZATION Right 10/02/2018   Procedure: RIGHT BREAST LUMPECTOMY WITH RADIOACTIVE SEED LOCALIZATION;  Surgeon: Curvin Deward MOULD, MD;  Location: [facility];  Service: General;  Laterality: Right;   COLONOSCOPY N/A 12/13/2015   Procedure: COLONOSCOPY;  Surgeon: Gladis MARLA Louder, MD;  Location: [facility];  Service: Endoscopy;  Laterality: N/A;   COLONOSCOPY  2023   FRACTURE SURGERY  2015   left hip   HARDWARE REMOVAL Left 07/08/2015   Procedure: LEFT HIP HARDWARE REMOVAL;  Surgeon: Dempsey Moan, MD;  Location: [facility];  Service: Orthopedics;  Laterality: Left;   MASTECTOMY W/ SENTINEL NODE BIOPSY Left 08/22/2023   Procedure: MASTECTOMY WITH SENTINEL LYMPH NODE BIOPSY;  Surgeon: Ebbie Cough, MD;  Location: [facility] OR;  Service: General;  Laterality: Left;  LMA w/ PEC BLOCK 150 MINUTES LEFT MASTECTOMY LEFT AXILLARY SENTINEL NODE BIOPSY MAGTRACE AND BLUE DYE INJECTION   PORTACATH PLACEMENT N/A 08/22/2023   Procedure: INSERTION, TUNNELED CENTRAL VENOUS DEVICE, WITH PORT;  Surgeon: Ebbie Cough, MD;  Location: [facility] OR;  Service: General;  Laterality: N/A;  PORT PLACEMENT WITH ULTRASOUND GUIDANCE   TOTAL MASTECTOMY Right 08/22/2023   Procedure: MASTECTOMY, SIMPLE;  Surgeon: Ebbie Cough, MD;  Location: [facility] OR;  Service: General;  Laterality: Right;  RIGHT RISK REDUCING MASTECTOMY   Family History  Problem Relation Age of Onset   Anxiety disorder Mother    Breast cancer Mother    Colon cancer Father 39   Cancer Maternal Grandmother        unknown primary   Esophageal cancer Neg Hx    Stomach cancer Neg Hx  Social History   Occupational History   Occupation: retired  Tobacco Use   Smoking status: Former    Current packs/day: 0.00    Average packs/day: 1 pack/day for 1 year (1.0 ttl pk-yrs)    Types: Cigarettes    Start date: 04/02/1972    Quit date: 04/02/1973    Years since quitting: 50.9   Smokeless tobacco: Never   Tobacco comments:    Smoked only in college  Vaping Use   Vaping status: Never Used  Substance and Sexual Activity   Alcohol use: Not Currently    Alcohol/week:  2.0 - 3.0 standard drinks of alcohol    Types: 1 Glasses of wine, 1 - 2 Standard drinks or equivalent per week   Drug use: No   Sexual activity: Yes    Birth control/protection: Post-menopausal   Tobacco Counseling Counseling given: Yes Tobacco comments: Smoked only in college  SDOH Screenings   Food Insecurity: No Food Insecurity (03/18/2024)  Housing: Unknown (03/18/2024)  Transportation Needs: No Transportation Needs (03/18/2024)  Utilities: Not At Risk (03/18/2024)  Alcohol Screen: Low Risk (09/13/2023)  Depression (PHQ2-9): Low Risk (03/18/2024)  Recent Concern: Depression (PHQ2-9) - High Risk (03/17/2024)  Financial Resource Strain: Low Risk (09/13/2023)  Physical Activity: Inactive (03/18/2024)  Social Connections: Socially Integrated (03/18/2024)  Stress: No Stress Concern Present (03/18/2024)  Tobacco Use: Medium Risk (03/18/2024)  Health Literacy: Adequate Health Literacy (03/18/2024)   See flowsheets for full screening details  Depression Screen PHQ 2 & 9 Depression Scale- Over the past 2 weeks, how often have you been bothered by any of the following problems? Little interest or pleasure in doing things: 0 Feeling down, depressed, or hopeless (PHQ Adolescent also includes...irritable): 1 PHQ-2 Total Score: 1 Trouble falling or staying asleep, or sleeping too much: 0 Feeling tired or having little energy: 0 Poor appetite or overeating (PHQ Adolescent also includes...weight loss): 0 Feeling bad about yourself - or that you are a failure or have let yourself or your family down: 0 Trouble concentrating on things, such as reading the newspaper or watching television (PHQ Adolescent also includes...like school work): 0 Moving or speaking so slowly that other people could have noticed. Or the opposite - being so fidgety or restless that you have been moving around a lot more than usual: 0 Thoughts that you would be better off dead, or of hurting yourself in some way:  0 PHQ-9 Total Score: 1 If you checked off any problems, how difficult have these problems made it for you to do your work, take care of things at home, or get along with other people?: Not difficult at all  Depression Treatment Depression Interventions/Treatment : EYV7-0 Score <4 Follow-up Not Indicated; Medication; Currently on Treatment     Goals Addressed               This Visit's Progress     Patient Stated (pt-stated)        Patient stated she plans to manage the Breast Cancer diagnosis and double mastectomy             Objective:    Today's Vitals   03/18/24 1015  Weight: 144 lb (65.3 kg)  Height: 5' 4 (1.626 m)   Body mass index is 24.72 kg/m.  Hearing/Vision screen Hearing Screening - Comments:: Denies hearing difficulties   Vision Screening - Comments:: Wears rx glasses - up to date with routine eye exams with Velinda Gianotti Immunizations and Health Maintenance Health Maintenance  Topic Date Due  Hepatitis C Screening  Never done   Zoster Vaccines- Shingrix (1 of 2) 07/17/1973   Mammogram  06/20/2024   COVID-19 Vaccine (8 - Mixed Product risk 2025-26 season) 07/25/2024   Medicare Annual Wellness (AWV)  03/18/2025   Colonoscopy  05/05/2026   DTaP/Tdap/Td (4 - Td or Tdap) 11/23/2028   Pneumococcal Vaccine: 50+ Years  Completed   Influenza Vaccine  Completed   Bone Density Scan  Completed   Meningococcal B Vaccine  Aged Out        Assessment/Plan:  This is a routine wellness examination for [place].  Hepatitis C Screening: ordered today  Patient Care Team: Geofm Glade PARAS, MD as PCP - General (Internal Medicine) Tasia Lung, MD as Consulting Physician (Psychiatry) Melodi Lerner, MD as Consulting Physician (Orthopedic Surgery) Barbette Knock, MD as Consulting Physician (Obstetrics and Gynecology) Shannon Agent, MD as Consulting Physician (Radiation Oncology) Lanny Callander, MD as Consulting Physician (Hematology) [facility]  as Consulting Physician (Radiology) Lita Lye, OD as Referring Physician (Optometry) Ebbie Cough, MD as Consulting Physician (General Surgery) Tyree Nanetta SAILOR, RN as Oncology Nurse Navigator  I have personally reviewed and noted the following in the patients chart:   Medical and social history Use of alcohol, tobacco or illicit drugs  Current medications and supplements including opioid prescriptions. Functional ability and status Nutritional status Physical activity Advanced directives List of other physicians Hospitalizations, surgeries, and ER visits in previous 12 months Vitals Screenings to include cognitive, depression, and falls Referrals and appointments  Orders Placed This Encounter  Procedures   Hepatitis C antibody    Standing Status:   Future    Expiration Date:   03/18/2025   In addition, I have reviewed and discussed with patient certain preventive protocols, quality metrics, and best practice recommendations. A written personalized care plan for preventive services as well as general preventive health recommendations were provided to patient.   Verdie CHRISTELLA Saba, CMA   03/18/2024   Return in 1 year (on 03/18/2025).  After Visit Summary: (MyChart) Due to this being a telephonic visit, the after visit summary with patients personalized plan was offered to patient via MyChart   Nurse Notes: scheduled a CPE appt w/PCP for 07/2024

## 2024-03-19 ENCOUNTER — Inpatient Hospital Stay

## 2024-03-19 DIAGNOSIS — Z171 Estrogen receptor negative status [ER-]: Secondary | ICD-10-CM

## 2024-03-19 DIAGNOSIS — C50412 Malignant neoplasm of upper-outer quadrant of left female breast: Secondary | ICD-10-CM

## 2024-03-19 DIAGNOSIS — Z170 Estrogen receptor positive status [ER+]: Secondary | ICD-10-CM

## 2024-03-19 DIAGNOSIS — Z1507 Genetic susceptibility to malignant neoplasm of urinary tract: Secondary | ICD-10-CM

## 2024-03-19 DIAGNOSIS — Z0000 Encounter for general adult medical examination without abnormal findings: Secondary | ICD-10-CM

## 2024-03-19 DIAGNOSIS — Z1159 Encounter for screening for other viral diseases: Secondary | ICD-10-CM

## 2024-03-19 DIAGNOSIS — M810 Age-related osteoporosis without current pathological fracture: Secondary | ICD-10-CM

## 2024-03-19 DIAGNOSIS — Z1505 Genetic susceptibility to malignant neoplasm of fallopian tube(s): Secondary | ICD-10-CM

## 2024-03-19 LAB — CMP (CANCER CENTER ONLY)
ALT: 11 U/L (ref 0–44)
AST: 18 U/L (ref 15–41)
Albumin: 4.5 g/dL (ref 3.5–5.0)
Alkaline Phosphatase: 66 U/L (ref 38–126)
Anion gap: 10 (ref 5–15)
BUN: 18 mg/dL (ref 8–23)
CO2: 25 mmol/L (ref 22–32)
Calcium: 9.5 mg/dL (ref 8.9–10.3)
Chloride: 102 mmol/L (ref 98–111)
Creatinine: 0.92 mg/dL (ref 0.44–1.00)
GFR, Estimated: >60 mL/min (ref >60)
Glucose, Bld: 106 mg/dL — ABNORMAL HIGH (ref 70–99)
Potassium: 4.2 mmol/L (ref 3.5–5.1)
Sodium: 138 mmol/L (ref 135–145)
Total Bilirubin: 0.4 mg/dL (ref 0.0–1.2)
Total Protein: 7.2 g/dL (ref 6.5–8.1)

## 2024-03-19 LAB — CBC WITH DIFFERENTIAL (CANCER CENTER ONLY)
Abs Immature Granulocytes: 0.01 K/uL (ref 0.00–0.07)
Basophils Absolute: 0 K/uL (ref 0.0–0.1)
Basophils Relative: 1 %
Eosinophils Absolute: 0.3 K/uL (ref 0.0–0.5)
Eosinophils Relative: 5 %
HCT: 35.9 % — ABNORMAL LOW (ref 36.0–46.0)
Hemoglobin: 12.1 g/dL (ref 12.0–15.0)
Immature Granulocytes: 0 %
Lymphocytes Relative: 14 %
Lymphs Abs: 0.6 K/uL — ABNORMAL LOW (ref 0.7–4.0)
MCH: 29.4 pg (ref 26.0–34.0)
MCHC: 33.7 g/dL (ref 30.0–36.0)
MCV: 87.1 fL (ref 80.0–100.0)
Monocytes Absolute: 0.3 K/uL (ref 0.1–1.0)
Monocytes Relative: 7 %
Neutro Abs: 3.4 K/uL (ref 1.7–7.7)
Neutrophils Relative %: 73 %
Platelet Count: 240 K/uL (ref 150–400)
RBC: 4.12 MIL/uL (ref 3.87–5.11)
RDW: 12.9 % (ref 11.5–15.5)
WBC Count: 4.7 K/uL (ref 4.0–10.5)
nRBC: 0 % (ref 0.0–0.2)

## 2024-03-19 LAB — LIPID PANEL
Cholesterol: 205 mg/dL — ABNORMAL HIGH (ref 0–200)
HDL: 68 mg/dL (ref >40)
LDL Cholesterol: 115 mg/dL — ABNORMAL HIGH (ref 0–99)
Total CHOL/HDL Ratio: 3 ratio
Triglycerides: 112 mg/dL (ref <150)
VLDL: 22 mg/dL (ref 0–40)

## 2024-03-19 LAB — VITAMIN D 25 HYDROXY (VIT D DEFICIENCY, FRACTURES): Vit D, 25-Hydroxy: 33.9 ng/mL (ref 30–100)

## 2024-03-20 ENCOUNTER — Other Ambulatory Visit: Payer: Self-pay

## 2024-03-20 LAB — THYROID PANEL WITH TSH
Free Thyroxine Index: 2.7 (ref 1.2–4.9)
T3 Uptake Ratio: 27 % (ref 24–39)
T4, Total: 9.9 ug/dL (ref 4.5–12.0)
TSH: 5.2 u[IU]/mL — ABNORMAL HIGH (ref 0.450–4.500)

## 2024-03-23 ENCOUNTER — Inpatient Hospital Stay

## 2024-03-23 ENCOUNTER — Ambulatory Visit: Payer: Medicare Other | Admitting: Nurse Practitioner

## 2024-03-29 ENCOUNTER — Other Ambulatory Visit: Payer: Self-pay | Admitting: Internal Medicine

## 2024-03-30 ENCOUNTER — Inpatient Hospital Stay: Admitting: Licensed Clinical Social Worker

## 2024-03-30 NOTE — Progress Notes (Signed)
 [facility] CSW Counseling Note  Patient was referred by self. Treatment type: Individual  Presenting Concerns: Patient and/or family reports the following symptoms/concerns: anxiety and depression Duration of problem: 1 months- worse since end of treatment; Severity of problem: severe   Orientation:oriented to person, place, time/date, and situation.   Affect: Appropriate and Congruent Risk of harm to self or others: No plan to harm self or others  Patient and/or Family's Strengths/Protective Factors: Social connections and Concrete supports in place (healthy food, safe environments, etc.)Ability for insight  Active sense of humor  Capable of independent living  Motivation for treatment/growth  Special hobby/interest  Supportive family/friends      Goals Addressed: Patient will:  Reduce symptoms of: anxiety and depression Increase knowledge and/or ability of: coping skills  Increase healthy adjustment to current life circumstances   Progress towards Goals: Progressing   Interventions: Interventions utilized:  Mindfulness or Management Consultant, CBT Cognitive Behavioral Therapy, and Psychoeducation and/or Health Education  GAD 7 PHQ 9 CSSRS     03/18/2024   10:28 AM 03/17/2024    3:31 PM 02/10/2024    9:54 AM  PHQ9 SCORE ONLY  PHQ-9 Total Score 1 17 0       03/17/2024    3:30 PM  GAD 7 : Generalized Anxiety Score  Nervous, Anxious, on Edge 3  Control/stop worrying 3  Worry too much - different things 3  Trouble relaxing 2  Restless 2  Easily annoyed or irritable 2  Afraid - awful might happen 3  Total GAD 7 Score 18  Anxiety Difficulty Somewhat difficult       Assessment: Patient experiencing continued severe anxiety and depression. She is doing behavioral straetgies regularly (engaging with friends, playing bridge, walking, anti-anxiety breath) but it is still a struggle daily and there is a strong pull to stay in bed (pt is avoiding this currently). She is  going to talk to her psychiatrist today or tomorrow about starting anti-depressant medication.  Discussed this today and reframed it as using all tools available instead of a failure of self.  Worked more on CBT & ACT tools for thought defusion and reframing, particularly around the thought that this feeling will not get better.     Pt is also mentally preparing for multiple weddings and a cruise this year and thinking about how she will look/feel. She has ordered wigs to try to help with confidence with hair.   Plan: Follow up with CSW: 1/15 at 10am Behavioral recommendations: continue anti-anxiety breath, walking, social activities. Use thought defusion techniques and though log to help with defeatist thoughts. Can utilize grounding with senses or categories to refocus away from unhelpful thoughts Referral(s): Support group(s):  [facility] class in March       [name] E Duanna Runk, LCSW

## 2024-04-16 ENCOUNTER — Inpatient Hospital Stay: Attending: Hematology | Admitting: Licensed Clinical Social Worker

## 2024-04-16 NOTE — Progress Notes (Signed)
 [facility] CSW Counseling Note  Patient was referred by self. Treatment type: Individual  Presenting Concerns: Patient and/or family reports the following symptoms/concerns: anxiety and depression Duration of problem: since Nov 2026- worse since end of treatment; Severity of problem: severe   Orientation:oriented to person, place, time/date, and situation.   Affect: Appropriate and Congruent Risk of harm to self or others: No plan to harm self or others  Patient and/or Family's Strengths/Protective Factors: Social connections and Concrete supports in place (healthy food, safe environments, etc.)Ability for insight  Active sense of humor  Capable of independent living  Motivation for treatment/growth  Special hobby/interest  Supportive family/friends      Goals Addressed: Patient will:  Reduce symptoms of: anxiety and depression Increase knowledge and/or ability of: coping skills  Increase healthy adjustment to current life circumstances   Progress towards Goals: Progressing   Interventions: Interventions utilized:  Mindfulness or Management Consultant, Psychoeducation and/or Health Education, and CBT for Insomnia  Did not repeat Screening tools today     03/18/2024   10:28 AM 03/17/2024    3:31 PM 02/10/2024    9:54 AM  PHQ9 SCORE ONLY  PHQ-9 Total Score 1 17 0       03/17/2024    3:30 PM  GAD 7 : Generalized Anxiety Score  Nervous, Anxious, on Edge 3  Control/stop worrying 3  Worry too much - different things 3  Trouble relaxing 2  Restless 2  Easily annoyed or irritable 2  Afraid - awful might happen 3  Total GAD 7 Score 18  Anxiety Difficulty Somewhat difficult       Assessment: Patient experiencing perceived improvement in mood and reduction in perseveration. She started Wellbutrin 2 weeks ago.  Pt is having an issue with sleep where she is having difficulty falling and staying asleep (sometimes wakes up middle of the night, sometimes wakes up very early). She  reports history of insomnia on and off since she was younger.   Psychoeducation on insomnia and sleep hygiene provided today. Pt is doing well with getting sunlight, avoiding naps, exercising, and keeping caffeine intake to the mornings. Pt does thought spiral about how little sleep she might be getting and if she will have a hard night again. Created plan for routine at night with no screens (use reading/audiobook, knitting, meditation/beach imagery, breathing, crosswords).   Provided sleep tracker and thought log for patient. Pt is aiming for 10pm lights out and get up around 7am.    Plan: Follow up with CSW: 1/29 at 1:30 Behavioral recommendations: continue anti-anxiety breath, walking, social activities. Start non-screen bedtime routine. Track sleep on sleep diary. Try catching thought worry spiral related to sleep. Referral(s): Support group(s):  [facility] class in March       Erbie Arment E Apryll Hinkle, LCSW

## 2024-04-26 ENCOUNTER — Other Ambulatory Visit: Payer: Self-pay | Admitting: Internal Medicine

## 2024-04-30 ENCOUNTER — Inpatient Hospital Stay: Admitting: Licensed Clinical Social Worker

## 2024-04-30 NOTE — Progress Notes (Signed)
 [facility] CSW Counseling Note  Patient was referred by self. Treatment type: Individual  Presenting Concerns: Patient and/or family reports the following symptoms/concerns: anxiety and depression Duration of problem: since Nov 2026- worse since end of treatment; Severity of problem: severe   Orientation:oriented to person, place, time/date, and situation.   Affect: Appropriate and Congruent Risk of harm to self or others: No plan to harm self or others  Patient and/or Family's Strengths/Protective Factors: Social connections and Concrete supports in place (healthy food, safe environments, etc.)Ability for insight  Active sense of humor  Capable of independent living  Motivation for treatment/growth  Special hobby/interest  Supportive family/friends      Goals Addressed: Patient will:  Reduce symptoms of: anxiety and depression Increase knowledge and/or ability of: coping skills  Increase healthy adjustment to current life circumstances   Progress towards Goals: Progressing   Interventions: Interventions utilized:  Mindfulness or Management Consultant, Psychoeducation and/or Health Education, and CBT for Insomnia  Did not repeat Screening tools today     03/18/2024   10:28 AM 03/17/2024    3:31 PM 02/10/2024    9:54 AM  PHQ9 SCORE ONLY  PHQ-9 Total Score 1 17 0       03/17/2024    3:30 PM  GAD 7 : Generalized Anxiety Score  Nervous, Anxious, on Edge 3   Control/stop worrying 3   Worry too much - different things 3   Trouble relaxing 2   Restless 2   Easily annoyed or irritable 2   Afraid - awful might happen 3   Total GAD 7 Score 18  Anxiety Difficulty Somewhat difficult     Data saved with a previous flowsheet row definition       Assessment: Patient experiencing increase anxiety since last visit, particularly with the icy weather 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 Her psychiatrist is also starting a new medication due to side  effects from Wellbutrin (dry mouth, no appetite). Pt was able to sign up for gym memberships/ silver sneakers, and started some organizing at home.  Reviewed sleep logs. Pt has average 7.2 hrs sleep/night with 78.2% sleep efficiency. She has started keeping her phone out of the room, using an alarm clock instead, and reading for 1 hour before bed.  She has stayed in bed for hours on the 3 nights where she woke for long periods in the middle of the night and anxiety about getting back to sleep. Discussed plan for what to do if she wakes during the night and has difficulty going back to sleep.    Plan: Follow up with CSW: 2/12 at 10am Behavioral recommendations: continue anti-anxiety breath and bedtime routine with reading. Re-start walking and social activities and knitting. Go to the gym 1-2 times. ,Use sleep tracker. If you  wake in the night and have trouble gong back to sleep, try breathing, beach meditation, or muscle relaxation. If more than 30 min, try to read or get up and knit until you are sleepy again Referral(s): Support group(s):  [facility] class in March       Aleaya Latona E Kennia Vanvorst, LCSW

## 2024-05-14 ENCOUNTER — Inpatient Hospital Stay: Attending: Hematology | Admitting: Licensed Clinical Social Worker

## 2024-05-25 ENCOUNTER — Encounter: Admitting: Student in an Organized Health Care Education/Training Program

## 2024-06-08 ENCOUNTER — Ambulatory Visit: Attending: General Surgery

## 2024-06-17 ENCOUNTER — Inpatient Hospital Stay: Admitting: Hematology and Oncology

## 2024-07-22 ENCOUNTER — Encounter: Admitting: Internal Medicine

## 2025-03-19 ENCOUNTER — Ambulatory Visit
